# Patient Record
Sex: Female | Born: 1950 | Race: White | Hispanic: No | Smoking: Former smoker
Health system: Southern US, Community
[De-identification: ages and names within clinical notes are randomized; demographics above are authoritative.]

## PROBLEM LIST (undated history)

## (undated) DIAGNOSIS — C50911 Malignant neoplasm of unspecified site of right female breast: Secondary | ICD-10-CM

## (undated) DIAGNOSIS — I4891 Unspecified atrial fibrillation: Secondary | ICD-10-CM

## (undated) DIAGNOSIS — F32A Depression, unspecified: Secondary | ICD-10-CM

## (undated) DIAGNOSIS — E785 Hyperlipidemia, unspecified: Secondary | ICD-10-CM

## (undated) DIAGNOSIS — R011 Cardiac murmur, unspecified: Secondary | ICD-10-CM

## (undated) DIAGNOSIS — F319 Bipolar disorder, unspecified: Secondary | ICD-10-CM

## (undated) DIAGNOSIS — F329 Major depressive disorder, single episode, unspecified: Secondary | ICD-10-CM

## (undated) DIAGNOSIS — E0500 Thyrotoxicosis with diffuse goiter without thyrotoxic crisis or storm: Secondary | ICD-10-CM

## (undated) DIAGNOSIS — I6529 Occlusion and stenosis of unspecified carotid artery: Secondary | ICD-10-CM

## (undated) DIAGNOSIS — I2510 Atherosclerotic heart disease of native coronary artery without angina pectoris: Secondary | ICD-10-CM

## (undated) DIAGNOSIS — I509 Heart failure, unspecified: Secondary | ICD-10-CM

## (undated) DIAGNOSIS — K219 Gastro-esophageal reflux disease without esophagitis: Secondary | ICD-10-CM

## (undated) DIAGNOSIS — I10 Essential (primary) hypertension: Secondary | ICD-10-CM

## (undated) DIAGNOSIS — I214 Non-ST elevation (NSTEMI) myocardial infarction: Secondary | ICD-10-CM

## (undated) DIAGNOSIS — R0602 Shortness of breath: Secondary | ICD-10-CM

## (undated) DIAGNOSIS — M1990 Unspecified osteoarthritis, unspecified site: Secondary | ICD-10-CM

## (undated) DIAGNOSIS — N059 Unspecified nephritic syndrome with unspecified morphologic changes: Secondary | ICD-10-CM

## (undated) DIAGNOSIS — I209 Angina pectoris, unspecified: Secondary | ICD-10-CM

## (undated) DIAGNOSIS — M549 Dorsalgia, unspecified: Secondary | ICD-10-CM

## (undated) DIAGNOSIS — G8929 Other chronic pain: Secondary | ICD-10-CM

## (undated) DIAGNOSIS — F419 Anxiety disorder, unspecified: Secondary | ICD-10-CM

## (undated) HISTORY — DX: Hyperlipidemia, unspecified: E78.5

## (undated) HISTORY — DX: Occlusion and stenosis of unspecified carotid artery: I65.29

## (undated) HISTORY — PX: CORONARY ANGIOPLASTY WITH STENT PLACEMENT: SHX49

## (undated) HISTORY — PX: VAGINAL HYSTERECTOMY: SUR661

## (undated) HISTORY — PX: JOINT REPLACEMENT: SHX530

## (undated) HISTORY — DX: Gastro-esophageal reflux disease without esophagitis: K21.9

## (undated) HISTORY — DX: Unspecified atrial fibrillation: I48.91

## (undated) HISTORY — DX: Atherosclerotic heart disease of native coronary artery without angina pectoris: I25.10

## (undated) SURGERY — ECHOCARDIOGRAM, TRANSESOPHAGEAL
Anesthesia: Moderate Sedation

---

## 1998-04-24 ENCOUNTER — Other Ambulatory Visit: Admission: RE | Admit: 1998-04-24 | Discharge: 1998-04-24 | Payer: Self-pay | Admitting: *Deleted

## 2000-04-04 ENCOUNTER — Emergency Department (HOSPITAL_COMMUNITY): Admission: EM | Admit: 2000-04-04 | Discharge: 2000-04-04 | Payer: Self-pay | Admitting: *Deleted

## 2000-11-03 DIAGNOSIS — C50911 Malignant neoplasm of unspecified site of right female breast: Secondary | ICD-10-CM

## 2000-11-03 HISTORY — DX: Malignant neoplasm of unspecified site of right female breast: C50.911

## 2000-11-03 HISTORY — PX: BREAST LUMPECTOMY: SHX2

## 2002-02-23 ENCOUNTER — Encounter: Payer: Self-pay | Admitting: Emergency Medicine

## 2002-02-23 ENCOUNTER — Encounter (INDEPENDENT_AMBULATORY_CARE_PROVIDER_SITE_OTHER): Payer: Self-pay | Admitting: *Deleted

## 2002-02-23 ENCOUNTER — Inpatient Hospital Stay (HOSPITAL_COMMUNITY): Admission: EM | Admit: 2002-02-23 | Discharge: 2002-02-28 | Payer: Self-pay | Admitting: Emergency Medicine

## 2003-10-16 ENCOUNTER — Encounter: Admission: RE | Admit: 2003-10-16 | Discharge: 2003-10-16 | Payer: Self-pay | Admitting: *Deleted

## 2003-12-01 ENCOUNTER — Encounter (INDEPENDENT_AMBULATORY_CARE_PROVIDER_SITE_OTHER): Payer: Self-pay | Admitting: Specialist

## 2003-12-01 ENCOUNTER — Ambulatory Visit (HOSPITAL_COMMUNITY): Admission: RE | Admit: 2003-12-01 | Discharge: 2003-12-01 | Payer: Self-pay | Admitting: *Deleted

## 2003-12-09 ENCOUNTER — Inpatient Hospital Stay (HOSPITAL_COMMUNITY): Admission: EM | Admit: 2003-12-09 | Discharge: 2003-12-13 | Payer: Self-pay | Admitting: Emergency Medicine

## 2004-02-20 ENCOUNTER — Ambulatory Visit (HOSPITAL_COMMUNITY): Admission: RE | Admit: 2004-02-20 | Discharge: 2004-02-20 | Payer: Self-pay | Admitting: *Deleted

## 2004-03-03 DIAGNOSIS — I214 Non-ST elevation (NSTEMI) myocardial infarction: Secondary | ICD-10-CM

## 2004-03-03 HISTORY — PX: CORONARY ANGIOPLASTY WITH STENT PLACEMENT: SHX49

## 2004-03-03 HISTORY — DX: Non-ST elevation (NSTEMI) myocardial infarction: I21.4

## 2004-03-22 ENCOUNTER — Inpatient Hospital Stay (HOSPITAL_COMMUNITY): Admission: EM | Admit: 2004-03-22 | Discharge: 2004-03-27 | Payer: Self-pay | Admitting: Emergency Medicine

## 2004-07-30 ENCOUNTER — Observation Stay (HOSPITAL_COMMUNITY): Admission: EM | Admit: 2004-07-30 | Discharge: 2004-07-31 | Payer: Self-pay | Admitting: Emergency Medicine

## 2004-10-04 ENCOUNTER — Observation Stay (HOSPITAL_COMMUNITY): Admission: EM | Admit: 2004-10-04 | Discharge: 2004-10-05 | Payer: Self-pay | Admitting: Emergency Medicine

## 2005-05-02 ENCOUNTER — Emergency Department (HOSPITAL_COMMUNITY): Admission: EM | Admit: 2005-05-02 | Discharge: 2005-05-02 | Payer: Self-pay | Admitting: Emergency Medicine

## 2005-10-02 ENCOUNTER — Inpatient Hospital Stay (HOSPITAL_COMMUNITY): Admission: EM | Admit: 2005-10-02 | Discharge: 2005-10-04 | Payer: Self-pay | Admitting: Emergency Medicine

## 2005-11-03 HISTORY — PX: FRACTURE SURGERY: SHX138

## 2006-02-28 ENCOUNTER — Emergency Department (HOSPITAL_COMMUNITY): Admission: EM | Admit: 2006-02-28 | Discharge: 2006-03-01 | Payer: Self-pay | Admitting: Emergency Medicine

## 2008-04-29 ENCOUNTER — Inpatient Hospital Stay (HOSPITAL_COMMUNITY): Admission: EM | Admit: 2008-04-29 | Discharge: 2008-05-02 | Payer: Self-pay | Admitting: Emergency Medicine

## 2008-04-29 ENCOUNTER — Ambulatory Visit: Payer: Self-pay | Admitting: Internal Medicine

## 2009-02-17 DIAGNOSIS — E0500 Thyrotoxicosis with diffuse goiter without thyrotoxic crisis or storm: Secondary | ICD-10-CM | POA: Insufficient documentation

## 2009-02-17 DIAGNOSIS — K219 Gastro-esophageal reflux disease without esophagitis: Secondary | ICD-10-CM

## 2009-02-17 DIAGNOSIS — E785 Hyperlipidemia, unspecified: Secondary | ICD-10-CM

## 2009-02-17 DIAGNOSIS — I2510 Atherosclerotic heart disease of native coronary artery without angina pectoris: Secondary | ICD-10-CM | POA: Insufficient documentation

## 2009-02-28 ENCOUNTER — Ambulatory Visit: Payer: Self-pay | Admitting: Cardiovascular Disease

## 2009-02-28 DIAGNOSIS — F341 Dysthymic disorder: Secondary | ICD-10-CM | POA: Insufficient documentation

## 2009-03-03 HISTORY — PX: CORONARY ANGIOPLASTY: SHX604

## 2009-03-06 ENCOUNTER — Telehealth (INDEPENDENT_AMBULATORY_CARE_PROVIDER_SITE_OTHER): Payer: Self-pay | Admitting: Radiology

## 2009-03-14 ENCOUNTER — Telehealth (INDEPENDENT_AMBULATORY_CARE_PROVIDER_SITE_OTHER): Payer: Self-pay | Admitting: *Deleted

## 2009-03-15 ENCOUNTER — Ambulatory Visit: Payer: Self-pay

## 2009-03-15 ENCOUNTER — Encounter: Payer: Self-pay | Admitting: Cardiovascular Disease

## 2009-03-16 ENCOUNTER — Telehealth: Payer: Self-pay | Admitting: Cardiovascular Disease

## 2009-03-21 ENCOUNTER — Telehealth: Payer: Self-pay | Admitting: Cardiovascular Disease

## 2009-03-30 ENCOUNTER — Inpatient Hospital Stay (HOSPITAL_COMMUNITY): Admission: EM | Admit: 2009-03-30 | Discharge: 2009-04-05 | Payer: Self-pay | Admitting: Emergency Medicine

## 2009-03-30 ENCOUNTER — Ambulatory Visit: Payer: Self-pay | Admitting: Internal Medicine

## 2009-04-01 ENCOUNTER — Encounter: Payer: Self-pay | Admitting: Cardiology

## 2009-04-13 ENCOUNTER — Telehealth: Payer: Self-pay | Admitting: Cardiovascular Disease

## 2009-05-18 ENCOUNTER — Telehealth: Payer: Self-pay | Admitting: Cardiovascular Disease

## 2009-05-24 ENCOUNTER — Ambulatory Visit: Payer: Self-pay | Admitting: Cardiovascular Disease

## 2009-05-24 DIAGNOSIS — M7989 Other specified soft tissue disorders: Secondary | ICD-10-CM

## 2009-05-24 DIAGNOSIS — F17200 Nicotine dependence, unspecified, uncomplicated: Secondary | ICD-10-CM

## 2009-09-10 ENCOUNTER — Telehealth: Payer: Self-pay | Admitting: Cardiovascular Disease

## 2009-09-14 ENCOUNTER — Encounter: Payer: Self-pay | Admitting: Cardiovascular Disease

## 2009-11-03 HISTORY — PX: TOTAL HIP ARTHROPLASTY: SHX124

## 2009-11-28 ENCOUNTER — Ambulatory Visit: Payer: Self-pay | Admitting: Cardiovascular Disease

## 2009-12-24 ENCOUNTER — Telehealth: Payer: Self-pay | Admitting: Cardiovascular Disease

## 2009-12-28 ENCOUNTER — Telehealth: Payer: Self-pay | Admitting: Cardiovascular Disease

## 2010-03-21 ENCOUNTER — Encounter (INDEPENDENT_AMBULATORY_CARE_PROVIDER_SITE_OTHER): Payer: Self-pay | Admitting: *Deleted

## 2010-03-29 ENCOUNTER — Telehealth: Payer: Self-pay | Admitting: Cardiovascular Disease

## 2010-04-08 ENCOUNTER — Telehealth: Payer: Self-pay | Admitting: Cardiovascular Disease

## 2010-05-29 ENCOUNTER — Ambulatory Visit: Payer: Self-pay | Admitting: Cardiovascular Disease

## 2010-06-03 ENCOUNTER — Ambulatory Visit: Payer: Self-pay | Admitting: Cardiology

## 2010-06-03 ENCOUNTER — Inpatient Hospital Stay (HOSPITAL_COMMUNITY): Admission: EM | Admit: 2010-06-03 | Discharge: 2010-06-07 | Payer: Self-pay | Admitting: Emergency Medicine

## 2010-09-04 ENCOUNTER — Telehealth: Payer: Self-pay | Admitting: Cardiovascular Disease

## 2010-10-07 ENCOUNTER — Telehealth: Payer: Self-pay | Admitting: Cardiovascular Disease

## 2010-11-24 ENCOUNTER — Encounter: Payer: Self-pay | Admitting: Nephrology

## 2010-11-24 ENCOUNTER — Encounter: Payer: Self-pay | Admitting: Surgery

## 2010-11-27 ENCOUNTER — Ambulatory Visit: Admit: 2010-11-27 | Payer: Self-pay | Admitting: Cardiovascular Disease

## 2010-12-03 NOTE — Assessment & Plan Note (Signed)
Summary: F6M/DM   CC:  dizziness...pt states she has not had any intreset in life ...she states she has not been taken Perphenazine  .  History of Present Illness: Caroline [name] is S/P recent reintervention to her diagonal branch by Dr. Tommy Medal.  unfortunately she has some psychiatric issues and financial issues and is noncompliant with her medications.  He has not been taking aspirin.  Continues to smoke.  Her counselor for less than 10 minutes. Hopefully she will start nicorrette gum which seems to be her choice.   Encouraged her to go to [facility] for [employer] and get the least expensive nicotine patches she can climb.  Her psychiatric medicines I would be hesitant to start her on Wellbutrin or Chantix in addition to her other medications that are centrally acting.  Were able to get assistance for her Plavix and she picked up samples recently.  She has a long-standing relationship with a psychiatrist that sees her every month or two.  otherwise she's not had a significant palpitations PND or orthopnea no syncope or lower extremity edema. She is complaining about her teeth and may need them pulled.  I think she will be ok stopping her Plavix for this althought she really is at risk for recurrent events given her smoking  Current Problems (verified): 1)  Smoker  (ICD-305.1) 2)  Swollen Limb (NOT EDEMA)  (ICD-729.81) 3)  Anxiety Depression  (ICD-300.4) 4)  Gerd  (ICD-530.81) 5)  Graves Disease  (ICD-242.00) 6)  Dyslipidemia  (ICD-272.4) 7)  Cad  (ICD-414.00)  Current Medications (verified): 1)  Plavix 75 Mg Tabs (Clopidogrel Bisulfate) .... Take One Tablet By Mouth Daily 2)  Hydroxyzine Hcl 25 Mg Tabs (Hydroxyzine Hcl) .... 2 Bid As Needed 3)  Seroquel Xr 150 Mg Xr24h-Tab (Quetiapine Fumarate) .[name].. 1 Tab Morning 4)  Clonazepam Odt 2 Mg Tbdp (Clonazepam) .[name].. 1 Tab By Mouth Three Times A Day 5)  Gabapentin 600 Mg Tabs (Gabapentin) .[name].. 1 Tab By Mouth Three Times A Day 6)  Aspirin Ec 325 Mg Tbec  (Aspirin) .[name].. 1 Tab By Mouth Once Daily 7)  Isosorbide Mononitrate Cr 30 Mg Xr24h-Tab (Isosorbide Mononitrate) .... Take One Tablet By Mouth Daily  Allergies (verified): 1)  ! Amoxicillin 2)  ! 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  Social History: Last updated: 02/17/2009  She is widowed.  She has 1 child.  She is on   disability.  The patient lives by herself, continues to do tobacco   use.  No alcohol use recently.      Review of Systems       Denies fever, malais, weight loss, blurry vision, decreased visual acuity, cough, sputum, SOB, hemoptysis, pleuritic pain, palpitaitons, heartburn, abdominal pain, melena, lower extremity edema, claudication, or rash.   Vital Signs:  Patient profile:   60 year old female Height:      63 inches Weight:      121 pounds BMI:     21.51 Pulse rate:   94 / minute Resp:     12 per minute BP sitting:   120 / 73  (left arm)  Vitals Entered By: Kem Parkinson (May 29, 2010 3:17 PM)  Physical Exam  General:  Affect appropriate Healthy:  appears stated age HEENT: normal Neck supple with no adenopathy JVP normal no bruits no thyromegaly Lungs clear with no wheezing and good diaphragmatic motion Heart:  S1/S2 no murmur,rub, gallop or click PMI normal Abdomen: benighn, BS positve, no tenderness, no AAA no bruit.  No HSM or HJR Distal pulses intact with no bruits No edema Neuro non-focal Skin warm and dry    Impression & Recommendations:  Problem # 1:  CAD (ICD-414.00) Stable no angina.  Continue ASA and Plavix Her updated medication list for this problem includes:    Plavix 75 Mg  Tabs (Clopidogrel bisulfate) .[name]... Take one tablet by mouth daily    Aspirin Ec 325 Mg Tbec (Aspirin) .[name]... 1 tab by mouth once daily    Isosorbide Mononitrate Cr 30 Mg Xr24h-tab (Isosorbide mononitrate) .[name]... Take one tablet by mouth daily  Problem # 2:  DYSLIPIDEMIA (ICD-272.4) Unfortunatley she is overwhelmed by her meds at this point.  Just got assistance for her Serequel and Plavix.  Instructed on low chol diet  Problem # 3:  SMOKER (ICD-305.1) Encouraged her to try nicotine replacement again.   EKG Report  Procedure date:  05/29/2010  Findings:      NSR 94 LAE Otherwise normal

## 2010-12-03 NOTE — Medication Information (Signed)
Summary: RX Folder  RX Folder   Imported By: Kassie Mends 11/20/2009 14:00:07  _____________________________________________________________________  External Attachment:    Type:   Image     Comment:   External Document

## 2010-12-03 NOTE — Letter (Signed)
Summary: Appointment - Reminder 2  [facility] [employer], Main Office  [address]   [place], [place] [address]   Phone: [phone]  Fax: [phone]     Mar 21, 2010 MRN: [id number]   Caroline [name] [address] [place], [place]  [address]   Dear [name],  Our records indicate that it is time to schedule a follow-up appointment with Dr. Eden Emms. It is very important that we reach you to schedule this appointment. We look forward to participating in your health care needs. Please contact us at the number listed above at your earliest convenience to schedule your appointment.  If you are unable to make an appointment at this time, give [place] a call so we can update our records.     Sincerely,  Migdalia Dk [facility] Scheduling Team

## 2010-12-03 NOTE — Progress Notes (Signed)
Summary: order from [facility]  Phone Note Call from Patient Call back at [facility] Phone [phone]   Caller: Patient Reason for Call: Talk to Nurse Summary of Call: needs plavix from [facility] Initial call taken by: Migdalia Dk,  Mar 29, 2010 3:21 PM  Follow-up for Phone Call        plavix ordered from [facility], pt aware Deliah Goody, RN  Mar 29, 2010 4:57 PM

## 2010-12-03 NOTE — Progress Notes (Signed)
Summary: discuss plavix  Phone Note Call from Patient Call back at Home Phone [phone] Message from:  Patient on December 28, 2009 11:56 AM  sample of meds  Caller: Patient Reason for Call: Talk to Nurse Details for Reason: Per pt calling, was suggestion to call company to send meds to home. [facility] said no they don't do that. PLAVIX 75 MG TABS Take one tablet by mouth daily Initial call taken by: Lorne Skeens,  December 28, 2009 11:57 AM  Follow-up for Phone Call        spoke with pt, plavix is here from [facility]. explained to pt we are unable to mail drugs. will take [place] and pt will pick up on wednesday at that office Deliah Goody, RN  December 28, 2009 2:42 PM

## 2010-12-03 NOTE — Progress Notes (Signed)
Summary: Need Plavix ordered  Phone Note Call from Patient Call back at Home Phone [phone]   Caller: Patient Summary of Call: pt need Plavix ordered from [facility] Initial call taken by: Judie Grieve,  September 04, 2010 11:05 AM  Follow-up for Phone Call        spoke with pt, according to [facility] her application has expired. pt aware will need proof of income to resend application Deliah Goody, RN  September 04, 2010 11:24 AM

## 2010-12-03 NOTE — Progress Notes (Signed)
Summary: Plavix  Phone Note Outgoing Call   Call placed by: Meredith Staggers, RN,  April 08, 2010 12:22 PM Call placed to: Patient Summary of Call: pts Plavix is here from company, called and left mess for pt to call back   Follow-up for Phone Call        RETURNING CALL, Migdalia Dk  April 09, 2010 3:37 PM   Additional Follow-up for Phone Call Additional follow up Details #1::        Called patient and advised that she can pick up Plavix  .Marland Kitchenleft at the front desk. Additional Follow-up by: Suzan Garibaldi RN

## 2010-12-03 NOTE — Assessment & Plan Note (Signed)
Summary: F6M/DM   CC:  check up.  History of Present Illness: Caroline [name] is S/P recent reintervention to her diagonal branch by Dr. Tommy Medal.  unfortunately she has some psychiatric issues and financial issues and is noncompliant with her medications.  He has not been taking aspirin.  Continues to smoke.  Her counselor for less than 10 minutes. Hopefully she will start nicorrette gum which seems to be her choice.   Encouraged her to go to [facility] for [employer] and get the least expensive nicotine patches she can climb.  Her psychiatric medicines I would be hesitant to start her on Wellbutrin or Chantix in addition to her other medications that are centrally acting.  Did have an episode of chest pain about a week and a half ago.  Did not sound anginal.  Took me a while to really explain the Conny how important it is for her to take her antiplatelet agents and stop smoking.  She has a long-standing relationship with a psychiatrist that sees her every month or two.  otherwise she's not had a significant palpitations PND or orthopnea no syncope or lower extremity edema. She is complaining about her teeth and may need them pulled.  I think she will be ok stopping her Plavix for this althought she really is at risk for recurrent events given her smoking  Current Problems (verified): 1)  Smoker  (ICD-305.1) 2)  Swollen Limb (NOT EDEMA)  (ICD-729.81) 3)  Anxiety Depression  (ICD-300.4) 4)  Gerd  (ICD-530.81) 5)  Graves Disease  (ICD-242.00) 6)  Dyslipidemia  (ICD-272.4) 7)  Cad  (ICD-414.00)  Current Medications (verified): 1)  Plavix 75 Mg Tabs (Clopidogrel Bisulfate) .... Take One Tablet By Mouth Daily 2)  Hydroxyzine Hcl 25 Mg Tabs (Hydroxyzine Hcl) .... 2 Tid As Needed 3)  Seroquel Xr 150 Mg Xr24h-Tab (Quetiapine Fumarate) .[name].. 1 Tab Morning 4)  Perphenazine 8 Mg Tabs (Perphenazine) .... As Needed 5)  Clonazepam Odt 2 Mg Tbdp (Clonazepam) .[name].. 1 Tab By Mouth Three Times A Day 6)  Gabapentin 600 Mg  Tabs (Gabapentin) .[name].. 1 Tab By Mouth Three Times A Day 7)  Aspirin Ec 325 Mg Tbec (Aspirin) .[name].. 1 Tab By Mouth Once Daily 8)  Isosorbide Mononitrate Cr 30 Mg Xr24h-Tab (Isosorbide Mononitrate) .... Take One Tablet By Mouth Daily  Allergies (verified): 1)  ! Amoxicillin 2)  ! 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  Social History: Last updated: 02/17/2009  She is widowed.  She has 1 child.  She is on   disability.  The patient lives by herself, continues to do tobacco   use.  No alcohol use recently.      Review of Systems       .Denies fever, malais, weight loss, blurry vision, decreased visual acuity, cough, sputum, SOB, hemoptysis, pleuritic pain, palpitaitons, heartburn, abdominal pain, melena, lower extremity edema, claudication, or rash. All other systems reviewed and negative  Vital Signs:  Patient profile:   60 year old female Height:      63 inches Weight:      134 pounds BMI:     23.82 Pulse rate:   94 / minute Resp:     12 per minute BP sitting:   130 /  80  (left arm)  Vitals Entered By: Kem Parkinson (November 28, 2009 3:26 PM)  Physical Exam  General:  Affect appropriate Healthy:  appears stated age HEENT: normal Neck supple with no adenopathy JVP normal no bruits no thyromegaly Lungs clear with no wheezing and good diaphragmatic motion Heart:  S1/S2 no murmur,rub, gallop or click PMI normal Abdomen: benighn, BS positve, no tenderness, no AAA no bruit.  No HSM or HJR Distal pulses intact with no bruits No edema Neuro non-focal Skin warm and dry Poor dentition Nicotine on breath   Impression &  Recommendations:  Problem # 1:  SMOKER (ICD-305.1) Start nicorrette gum as needed.  Not a candidate for centrally acting drug given psychiatric issues  Problem # 2:  DYSLIPIDEMIA (ICD-272.4) Labs in 6 months target LDL less than 100  Problem # 3:  CAD (ICD-414.00) Atypical left sided pain.  Add imdur.  Would prefer to avoid cath if possible give recent intervention. Her updated medication list for this problem includes:    Plavix 75 Mg Tabs (Clopidogrel bisulfate) .[name]... Take one tablet by mouth daily    Aspirin Ec 325 Mg Tbec (Aspirin) .[name]... 1 tab by mouth once daily    Isosorbide Mononitrate Cr 30 Mg Xr24h-tab (Isosorbide mononitrate) .[name]... Take one tablet by mouth daily  Patient Instructions: 1)  Your physician recommends that you schedule a follow-up appointment in: 6 MONTHS 2)  Caroline [name] 3)  [address] 4)  [phone] Prescriptions: ISOSORBIDE MONONITRATE CR 30 MG XR24H-TAB (ISOSORBIDE MONONITRATE) Take one tablet by mouth daily  #30 x 12   Entered by:   Deliah Goody, RN   Authorized by:   Colon Branch, MD, [facility]   Signed by:   Deliah Goody, RN on 11/28/2009   Method used:   Electronically to        [facility]* (retail)       [address]       [address]       [place], [place]  [address]       Ph: [phone]       Fax: [phone]   RxID:   [id number]

## 2010-12-03 NOTE — Progress Notes (Signed)
Summary: pt needs refill from [facility]  Phone Note Refill Request Message from:  Patient  Refills Requested: Medication #1:  PLAVIX 75 MG TABS Take one tablet by mouth daily pt needs order put in [facility]  Initial call taken by: Omer Jack,  December 24, 2009 4:08 PM  Follow-up for Phone Call        plavix ordered from [facility], pt aware Deliah Goody, RN  December 24, 2009 4:43 PM

## 2010-12-03 NOTE — Progress Notes (Signed)
Summary: order for plavix  Phone Note Call from Patient   Caller: Patient [phone] Reason for Call: Talk to Nurse Summary of Call: pt faxed order for plavix omn 12-7 and she hasn't heard back, did you recieve it? Initial call taken by: Glynda Jaeger,  October 07, 2010 10:39 AM  Follow-up for Phone Call        Phone Call Completed CALL PLACED TO [facility] PT'S APP WAS APPROVED FOR PLAVIX BUT NONE  WAS SENT PER [facility] PLAVIX TO BE SENT PRIORITY SHOULD HAVE IN 2-3 BUS DAYS. PT AWARE WILL CALL ONCE RECEIVED  Follow-up by: Scherrie Bateman, LPN,  October 07, 2010 11:43 AM     Appended Document: order for plavix left message for pt plavix at the front desk for pick up

## 2010-12-17 ENCOUNTER — Ambulatory Visit: Payer: Self-pay | Admitting: Cardiovascular Disease

## 2011-01-17 ENCOUNTER — Encounter: Payer: Self-pay | Admitting: Cardiovascular Disease

## 2011-01-17 LAB — DIFFERENTIAL
Basophils Absolute: 0.1 10*3/uL (ref 0.0–0.1)
Basophils Relative: 1 % (ref 0–1)
Basophils Relative: 1 % (ref 0–1)
Eosinophils Absolute: 0.1 10*3/uL (ref 0.0–0.7)
Eosinophils Absolute: 0.1 10*3/uL (ref 0.0–0.7)
Eosinophils Absolute: 0.1 10*3/uL (ref 0.0–0.7)
Eosinophils Absolute: 0.2 10*3/uL (ref 0.0–0.7)
Eosinophils Relative: 1 % (ref 0–5)
Eosinophils Relative: 2 % (ref 0–5)
Lymphocytes Relative: 16 % (ref 12–46)
Lymphs Abs: 1.5 10*3/uL (ref 0.7–4.0)
Lymphs Abs: 2.3 10*3/uL (ref 0.7–4.0)
Monocytes Absolute: 0.5 10*3/uL (ref 0.1–1.0)
Monocytes Relative: 5 % (ref 3–12)
Monocytes Relative: 9 % (ref 3–12)
Neutro Abs: 5.2 10*3/uL (ref 1.7–7.7)
Neutrophils Relative %: 55 % (ref 43–77)
Neutrophils Relative %: 69 % (ref 43–77)
Neutrophils Relative %: 73 % (ref 43–77)

## 2011-01-17 LAB — BASIC METABOLIC PANEL
BUN: 10 mg/dL (ref 6–23)
BUN: 17 mg/dL (ref 6–23)
BUN: 21 mg/dL (ref 6–23)
BUN: 9 mg/dL (ref 6–23)
CO2: 26 mEq/L (ref 19–32)
CO2: 27 mEq/L (ref 19–32)
Calcium: 7.9 mg/dL — ABNORMAL LOW (ref 8.4–10.5)
Calcium: 9.1 mg/dL (ref 8.4–10.5)
Chloride: 101 mEq/L (ref 96–112)
Chloride: 104 mEq/L (ref 96–112)
Chloride: 108 mEq/L (ref 96–112)
Creatinine, Ser: 1.45 mg/dL — ABNORMAL HIGH (ref 0.4–1.2)
Creatinine, Ser: 1.81 mg/dL — ABNORMAL HIGH (ref 0.4–1.2)
GFR calc Af Amer: 35 mL/min — ABNORMAL LOW (ref >60)
GFR calc Af Amer: 45 mL/min — ABNORMAL LOW (ref >60)
GFR calc Af Amer: 48 mL/min — ABNORMAL LOW (ref >60)
GFR calc Af Amer: 50 mL/min — ABNORMAL LOW (ref >60)
GFR calc non Af Amer: 29 mL/min — ABNORMAL LOW (ref >60)
GFR calc non Af Amer: 37 mL/min — ABNORMAL LOW (ref >60)
GFR calc non Af Amer: 39 mL/min — ABNORMAL LOW (ref >60)
Glucose, Bld: 106 mg/dL — ABNORMAL HIGH (ref 70–99)
Potassium: 3.5 mEq/L (ref 3.5–5.1)
Potassium: 4.1 mEq/L (ref 3.5–5.1)
Potassium: 4.3 mEq/L (ref 3.5–5.1)
Potassium: 4.5 mEq/L (ref 3.5–5.1)
Sodium: 137 mEq/L (ref 135–145)
Sodium: 138 mEq/L (ref 135–145)
Sodium: 140 mEq/L (ref 135–145)

## 2011-01-17 LAB — RAPID URINE DRUG SCREEN, HOSP PERFORMED
Amphetamines: NOT DETECTED
Barbiturates: NOT DETECTED
Benzodiazepines: POSITIVE — AB
Tetrahydrocannabinol: NOT DETECTED

## 2011-01-17 LAB — CBC
HCT: 32.2 % — ABNORMAL LOW (ref 36.0–46.0)
HCT: 37.8 % (ref 36.0–46.0)
Hemoglobin: 10.9 g/dL — ABNORMAL LOW (ref 12.0–15.0)
Hemoglobin: 11.5 g/dL — ABNORMAL LOW (ref 12.0–15.0)
Hemoglobin: 13.3 g/dL (ref 12.0–15.0)
MCH: 29 pg (ref 26.0–34.0)
MCH: 29.2 pg (ref 26.0–34.0)
MCH: 29.7 pg (ref 26.0–34.0)
MCHC: 33.7 g/dL (ref 30.0–36.0)
MCHC: 35.1 g/dL (ref 30.0–36.0)
MCV: 84.5 fL (ref 78.0–100.0)
MCV: 86.4 fL (ref 78.0–100.0)
MCV: 87 fL (ref 78.0–100.0)
MCV: 88.1 fL (ref 78.0–100.0)
Platelets: 131 10*3/uL — ABNORMAL LOW (ref 150–400)
Platelets: 168 10*3/uL (ref 150–400)
Platelets: 194 10*3/uL (ref 150–400)
Platelets: 236 10*3/uL (ref 150–400)
RBC: 3.7 MIL/uL — ABNORMAL LOW (ref 3.87–5.11)
RBC: 3.97 MIL/uL (ref 3.87–5.11)
RBC: 4 MIL/uL (ref 3.87–5.11)
RBC: 4.47 MIL/uL (ref 3.87–5.11)
RDW: 14.5 % (ref 11.5–15.5)
RDW: 15.3 % (ref 11.5–15.5)
RDW: 15.5 % (ref 11.5–15.5)
WBC: 5.4 10*3/uL (ref 4.0–10.5)
WBC: 7.6 10*3/uL (ref 4.0–10.5)
WBC: 9.6 10*3/uL (ref 4.0–10.5)

## 2011-01-17 LAB — CK TOTAL AND CKMB (NOT AT ARMC)
Relative Index: INVALID (ref 0.0–2.5)
Relative Index: INVALID (ref 0.0–2.5)
Total CK: 47 U/L (ref 7–177)

## 2011-01-17 LAB — COMPREHENSIVE METABOLIC PANEL
ALT: 12 U/L (ref 0–35)
Alkaline Phosphatase: 82 U/L (ref 39–117)
BUN: 9 mg/dL (ref 6–23)
CO2: 23 mEq/L (ref 19–32)
Calcium: 8.6 mg/dL (ref 8.4–10.5)
GFR calc non Af Amer: 40 mL/min — ABNORMAL LOW (ref >60)
Glucose, Bld: 160 mg/dL — ABNORMAL HIGH (ref 70–99)
Sodium: 137 mEq/L (ref 135–145)

## 2011-01-17 LAB — TSH: TSH: 2.93 u[IU]/mL (ref 0.350–4.500)

## 2011-01-17 LAB — TYPE AND SCREEN
ABO/RH(D): A POS
Antibody Screen: NEGATIVE

## 2011-01-17 LAB — POCT CARDIAC MARKERS: Myoglobin, poc: 126 ng/mL (ref 12–200)

## 2011-01-17 LAB — ETHANOL: Alcohol, Ethyl (B): <5 mg/dL (ref 0–10)

## 2011-01-17 LAB — ABO/RH: ABO/RH(D): A POS

## 2011-01-17 LAB — TROPONIN I: Troponin I: 0.02 ng/mL (ref 0.00–0.06)

## 2011-01-17 LAB — MAGNESIUM: Magnesium: 1.6 mg/dL (ref 1.5–2.5)

## 2011-01-17 LAB — PHOSPHORUS
Phosphorus: 3 mg/dL (ref 2.3–4.6)
Phosphorus: 4.6 mg/dL (ref 2.3–4.6)

## 2011-01-21 ENCOUNTER — Other Ambulatory Visit: Payer: Self-pay | Admitting: Cardiovascular Disease

## 2011-01-21 ENCOUNTER — Telehealth: Payer: Self-pay | Admitting: *Deleted

## 2011-01-21 DIAGNOSIS — I2510 Atherosclerotic heart disease of native coronary artery without angina pectoris: Secondary | ICD-10-CM

## 2011-01-21 MED ORDER — CLOPIDOGREL BISULFATE 75 MG PO TABS: 75.0000 mg | ORAL_TABLET | Freq: Every day | ORAL | Status: DC

## 2011-01-21 NOTE — Telephone Encounter (Signed)
plavix ordered from [facility]

## 2011-01-21 NOTE — Telephone Encounter (Signed)
Med refill done in error...please send to [facility] Plavix assistance program....cm[facility]

## 2011-01-27 ENCOUNTER — Telehealth: Payer: Self-pay | Admitting: *Deleted

## 2011-01-27 NOTE — Telephone Encounter (Signed)
Spoke with pt, plavix is at the front desk for pick up

## 2011-01-30 ENCOUNTER — Ambulatory Visit: Payer: Self-pay | Admitting: Cardiovascular Disease

## 2011-02-10 LAB — BASIC METABOLIC PANEL
BUN: 28 mg/dL — ABNORMAL HIGH (ref 6–23)
CO2: 26 mEq/L (ref 19–32)
Calcium: 9.4 mg/dL (ref 8.4–10.5)
Chloride: 109 mEq/L (ref 96–112)
Creatinine, Ser: 1.14 mg/dL (ref 0.4–1.2)
Creatinine, Ser: 1.29 mg/dL — ABNORMAL HIGH (ref 0.4–1.2)
GFR calc Af Amer: 60 mL/min — ABNORMAL LOW (ref >60)
Glucose, Bld: 158 mg/dL — ABNORMAL HIGH (ref 70–99)
Sodium: 141 mEq/L (ref 135–145)

## 2011-02-10 LAB — POCT I-STAT 3, VENOUS BLOOD GAS (G3P V)
Acid-base deficit: 5 mmol/L — ABNORMAL HIGH (ref 0.0–2.0)
Bicarbonate: 21.8 mEq/L (ref 20.0–24.0)
pH, Ven: 7.291 (ref 7.250–7.300)
pO2, Ven: 33 mmHg (ref 30.0–45.0)

## 2011-02-10 LAB — CBC
Hemoglobin: 11.3 g/dL — ABNORMAL LOW (ref 12.0–15.0)
MCHC: 34.4 g/dL (ref 30.0–36.0)
MCV: 88.2 fL (ref 78.0–100.0)
RBC: 3.75 MIL/uL — ABNORMAL LOW (ref 3.87–5.11)
RDW: 15.1 % (ref 11.5–15.5)
WBC: 7.4 10*3/uL (ref 4.0–10.5)

## 2011-02-10 LAB — POCT I-STAT 3, ART BLOOD GAS (G3+)
O2 Saturation: 95 %
TCO2: 25 mmol/L (ref 0–100)
pCO2 arterial: 41.3 mmHg (ref 35.0–45.0)
pO2, Arterial: 77 mmHg — ABNORMAL LOW (ref 80.0–100.0)

## 2011-02-10 LAB — CORTISOL: Cortisol, Plasma: 11.1 ug/dL

## 2011-02-11 LAB — CBC
HCT: 32.7 % — ABNORMAL LOW (ref 36.0–46.0)
HCT: 34 % — ABNORMAL LOW (ref 36.0–46.0)
Hemoglobin: 11.5 g/dL — ABNORMAL LOW (ref 12.0–15.0)
MCHC: 34.3 g/dL (ref 30.0–36.0)
MCV: 88.6 fL (ref 78.0–100.0)
MCV: 88.7 fL (ref 78.0–100.0)
Platelets: 177 10*3/uL (ref 150–400)
Platelets: 195 10*3/uL (ref 150–400)
Platelets: 210 10*3/uL (ref 150–400)
RBC: 3.69 MIL/uL — ABNORMAL LOW (ref 3.87–5.11)
RBC: 3.81 MIL/uL — ABNORMAL LOW (ref 3.87–5.11)
RBC: 4.02 MIL/uL (ref 3.87–5.11)
RDW: 14.8 % (ref 11.5–15.5)
WBC: 6.5 10*3/uL (ref 4.0–10.5)
WBC: 7.8 10*3/uL (ref 4.0–10.5)
WBC: 7.8 10*3/uL (ref 4.0–10.5)

## 2011-02-11 LAB — URINE DRUGS OF ABUSE SCREEN W ALC, ROUTINE (REF LAB)
Barbiturate Quant, Ur: NEGATIVE
Benzodiazepines.: NEGATIVE
Cocaine Metabolites: NEGATIVE
Methadone: NEGATIVE
Phencyclidine (PCP): NEGATIVE

## 2011-02-11 LAB — CULTURE, BLOOD (ROUTINE X 2)
Culture: NO GROWTH
Culture: NO GROWTH

## 2011-02-11 LAB — URINALYSIS, ROUTINE W REFLEX MICROSCOPIC
Glucose, UA: NEGATIVE mg/dL
Nitrite: NEGATIVE
Specific Gravity, Urine: 1.006 (ref 1.005–1.030)
pH: 6 (ref 5.0–8.0)

## 2011-02-11 LAB — PROTIME-INR
INR: 0.9 (ref 0.00–1.49)
Prothrombin Time: 12.5 seconds (ref 11.6–15.2)

## 2011-02-11 LAB — CARDIAC PANEL(CRET KIN+CKTOT+MB+TROPI)
CK, MB: 1.1 ng/mL (ref 0.3–4.0)
Relative Index: INVALID (ref 0.0–2.5)
Relative Index: INVALID (ref 0.0–2.5)
Total CK: 41 U/L (ref 7–177)
Troponin I: 0.03 ng/mL (ref 0.00–0.06)

## 2011-02-11 LAB — LIPID PANEL
Cholesterol: 253 mg/dL — ABNORMAL HIGH (ref 0–200)
HDL: 37 mg/dL — ABNORMAL LOW (ref >39)
Total CHOL/HDL Ratio: 6.8 RATIO
Triglycerides: 75 mg/dL (ref <150)

## 2011-02-11 LAB — COMPREHENSIVE METABOLIC PANEL
AST: 16 U/L (ref 0–37)
CO2: 22 mEq/L (ref 19–32)
Calcium: 8.4 mg/dL (ref 8.4–10.5)
Creatinine, Ser: 1.24 mg/dL — ABNORMAL HIGH (ref 0.4–1.2)
GFR calc Af Amer: 54 mL/min — ABNORMAL LOW (ref >60)
GFR calc non Af Amer: 45 mL/min — ABNORMAL LOW (ref >60)

## 2011-02-11 LAB — BASIC METABOLIC PANEL
BUN: 14 mg/dL (ref 6–23)
BUN: 25 mg/dL — ABNORMAL HIGH (ref 6–23)
Calcium: 9 mg/dL (ref 8.4–10.5)
Chloride: 99 mEq/L (ref 96–112)
GFR calc Af Amer: 47 mL/min — ABNORMAL LOW (ref >60)
GFR calc Af Amer: 52 mL/min — ABNORMAL LOW (ref >60)
GFR calc Af Amer: 58 mL/min — ABNORMAL LOW (ref >60)
GFR calc non Af Amer: 39 mL/min — ABNORMAL LOW (ref >60)
GFR calc non Af Amer: 43 mL/min — ABNORMAL LOW (ref >60)
GFR calc non Af Amer: 48 mL/min — ABNORMAL LOW (ref >60)
Glucose, Bld: 112 mg/dL — ABNORMAL HIGH (ref 70–99)
Potassium: 3.4 mEq/L — ABNORMAL LOW (ref 3.5–5.1)
Potassium: 4.3 mEq/L (ref 3.5–5.1)
Sodium: 142 mEq/L (ref 135–145)

## 2011-02-11 LAB — URINE CULTURE

## 2011-02-11 LAB — DIFFERENTIAL
Eosinophils Relative: 2 % (ref 0–5)
Lymphocytes Relative: 37 % (ref 12–46)
Lymphs Abs: 2.7 10*3/uL (ref 0.7–4.0)
Neutro Abs: 3.8 10*3/uL (ref 1.7–7.7)

## 2011-02-11 LAB — APTT: aPTT: 32 seconds (ref 24–37)

## 2011-02-11 LAB — BRAIN NATRIURETIC PEPTIDE: Pro B Natriuretic peptide (BNP): 156 pg/mL — ABNORMAL HIGH (ref 0.0–100.0)

## 2011-02-11 LAB — POCT CARDIAC MARKERS: Myoglobin, poc: 164 ng/mL (ref 12–200)

## 2011-02-11 LAB — GLUCOSE, CAPILLARY

## 2011-02-11 LAB — TSH: TSH: 0.822 u[IU]/mL (ref 0.350–4.500)

## 2011-02-11 LAB — CORTISOL-AM, BLOOD: Cortisol - AM: 3.8 ug/dL — ABNORMAL LOW (ref 4.3–22.4)

## 2011-02-19 ENCOUNTER — Ambulatory Visit (INDEPENDENT_AMBULATORY_CARE_PROVIDER_SITE_OTHER): Payer: Medicare Other | Admitting: Cardiovascular Disease

## 2011-02-19 ENCOUNTER — Encounter: Payer: Self-pay | Admitting: Cardiovascular Disease

## 2011-02-19 DIAGNOSIS — F17200 Nicotine dependence, unspecified, uncomplicated: Secondary | ICD-10-CM

## 2011-02-19 DIAGNOSIS — E785 Hyperlipidemia, unspecified: Secondary | ICD-10-CM

## 2011-02-19 DIAGNOSIS — I2510 Atherosclerotic heart disease of native coronary artery without angina pectoris: Secondary | ICD-10-CM

## 2011-02-19 NOTE — Assessment & Plan Note (Signed)
Stable no angina and no complications during stress of surgery

## 2011-02-19 NOTE — Assessment & Plan Note (Addendum)
Does not like taking statin.  Discussed low cholesterol diet

## 2011-02-19 NOTE — Patient Instructions (Signed)
Your physician recommends that you schedule a follow-up appointment in: 6 months with Dr. Nishan 

## 2011-02-19 NOTE — Progress Notes (Signed)
Caroline [name] is S/P recent reintervention to her diagonal branch by Dr. Tommy Medal 2011.  unfortunately she has some psychiatric issues and financial issues and is noncompliant with her medications.  He has not been taking aspirin.  Continues to smoke.  Her counselor for less than 10 minutes. Hopefully she will start nicorrette gum which seems to be her choice.   Encouraged her to go to [facility] for [employer] and get the least expensive nicotine patches she can climb.  Her psychiatric medicines I would be hesitant to start her on Wellbutrin or Chantix in addition to her other medications that are centrally acting.  Were able to get assistance for her Plavix and she picked up samples recently.  She has a long-standing relationship with a psychiatrist that sees her every month or two.  otherwise she's not had a significant palpitations PND or orthopnea no syncope or lower extremity edema. She is complaining about her teeth and may need them pulled.  I think she will be ok stopping her Plavix for this althought she really is at risk for recurrent events given her smoking  Larey Seat and broke left hip in November.  Still needs cane to ambulate but living at home now.  No cardiac complications during surgery  ROS: Denies fever, malais, weight loss, blurry vision, decreased visual acuity, cough, sputum, SOB, hemoptysis, pleuritic pain, palpitaitons, heartburn, abdominal pain, melena, lower extremity edema, claudication, or rash.   General: Affect appropriate Chronically ill appearing HEENT: normal Neck supple with no adenopathy JVP normal no bruits no thyromegaly Lungs clear with no wheezing and good diaphragmatic motion Heart:  S1/S2 no murmur,rub, gallop or click PMI normal Abdomen: benighn, BS positve, no tenderness, no AAA no bruit.  No HSM or HJR Distal pulses intact with no bruits No edema Neuro non-focal Skin warm and dry No muscular weakness   Current Outpatient Prescriptions  Medication Sig Dispense  Refill  . aspirin 81 MG tablet Take 81 mg by mouth daily.        . Calcium Carbonate-Vit D-Min (CALTRATE 600+D PLUS PO) Take 1 tablet by mouth daily.        . clonazepam (KLONOPIN) 2 MG disintegrating tablet Take 2 mg by mouth 3 (three) times daily as needed.        . clopidogrel (PLAVIX) 75 MG tablet Take 1 tablet (75 mg total) by mouth daily.  30 tablet  3  . gabapentin (NEURONTIN) 300 MG capsule Take 300 mg by mouth 3 (three) times daily.        . isosorbide mononitrate (IMDUR) 30 MG 24 hr tablet Take 30 mg by mouth daily.        . Omega-3 Fatty Acids (FISH OIL) 1000 MG CAPS Take 1 capsule by mouth 2 (two) times daily.        . QUEtiapine (SEROQUEL) 400 MG tablet Take 400 mg by mouth at bedtime.        [name] QUEtiapine Fumarate (SEROQUEL XR) 150 MG TB24 1 tab po qd      . DISCONTD: hydrOXYzine (VISTARIL) 25 MG capsule Take 25 mg by mouth 3 (three) times daily as needed.          Allergies  Amoxicillin and Codeine  Electrocardiogram:  Assessment and Plan

## 2011-02-19 NOTE — Assessment & Plan Note (Signed)
No motivation to quit and difficult to explain long term effects because of bipolar disease  Did discuss nicotine replacement with gum.  Counseled less than 10 minutes

## 2011-03-18 NOTE — Discharge Summary (Signed)
Caroline [name], Caroline [name] NO.:  [id number]   MEDICAL RECORD NO.:  [id number]          PATIENT TYPE:  INP   LOCATION:  2007                         FACILITY:  [facility]   PHYSICIAN:  Noralyn Pick. Eden Emms, MD, FACCDATE OF BIRTH:  1951-07-18   DATE OF ADMISSION:  03/30/2009  DATE OF DISCHARGE:  04/05/2009                               DISCHARGE SUMMARY   PRIMARY CARDIOLOGIST:  Dr. Juluis Rainier.   DISCHARGE DIAGNOSIS:  Unstable angina.   SECONDARY DIAGNOSES:  1. Coronary artery disease status post previous stenting of the      diagonal with in-stent restenosis and cutting balloon angioplasty      this admission.  2. Hypotension.  3. Ongoing tobacco abuse.  4. Chronic systolic congestive heart failure/ischemic cardiomyopathy      with an EF 40% this admission.  5. History of medication nonadherence.  6. Chronic systolic congestive heart failure.  7. History of Bright disease.  8. Depression/anxiety.  9. History breast cancer.  10.History of Graves disease.   ALLERGIES:  CODEINE and AMOXICILLIN.   PROCEDURES:  A 2-D echocardiogram performed on Apr 01, 2009, showing an  EF of 40% with apical anterior and anteroseptal hypokinesis.  Mild  mitral and aortic regurgitation.  Cardiac catheterization performed on  April 03, 2009, showing 90% in-stent restenosis within a day previously  placed stent in the diagonal branch of the LAD.  The patient otherwise  had nonobstructive disease.  EF was 35% by left ventriculography.  The  diagonal was successfully treated with cutting balloon angioplasty.   HISTORY OF PRESENT ILLNESS:  A 60 year old Caucasian female with prior  history of CAD status post previous post stenting of the first diagonal  branch of the LAD with subsequent in-stent restenosis in June 2009, at  that time treated with a drug-eluting stent.  The patient saw Dr. Eden Emms  as a new patient on February 28, 2009, and reported a chest discomfort.  Myoview was performed and  showed no evidence of ischemia.  Unfortunately, she continued to have ongoing chest discomfort and  subsequently presented to the [facility] ED on Mar 30, 2009.  In the ED,  ECG showed no acute changes and cardiac markers were negative.  She was  admitted for further evaluation.   HOSPITAL COURSE:  Shortly after admission, the patient was noted to be  hypotensive with a mean arterial pressure of approximately 50.  She was  placed on dopamine infusion and treated with intravenous normal saline  bolus.  Beta-blocker and nitrates were held.  Cardiac markers were  subsequently normal.  A 2-D echocardiogram was undertaken on Apr 01, 2009, showing an EF of 40%.  Dopamine was eventually weaned and the  patient was felt stable for cardiac catheterization on April 03, 2009.  Catheterization revealed 90% in-stent restenosis within a previously  placed drug-eluting stent in the first diagonal.  She otherwise had  nonobstructive disease.  The first diagonal was successfully treated  with cutting balloon angioplasty.  Postprocedure, we tried again to  reinitiate beta-blocker therapy as she had been taking carvedilol at  home; however,  she had recurrent hypotension with systolic pressures  down into the 70s associated with dizziness.  As a result, beta-blocker  has been discontinued for the time being.  She has since worked with  cardiac rehab and was able to ambulate without recurrent symptoms or  orthostasis.  She had no recurrent chest pain and her pressures have  improved and are presently in the 120s.  She is discharged home today in  good condition.   DISCHARGE LABS:  Hemoglobin 11.1, hematocrit 32.4, WBC 6.3, platelets  207.  Sodium 143, potassium 3.6, chloride 110, CO2 of 26, BUN 28,  creatinine 1.29, glucose 158, total bilirubin 0.4, alkaline phosphatase  62, AST 16, ALT 11, total protein 6.0, albumin 3.2, calcium 9.4, CK 41,  MB 1.1, troponin-I 0.02.  BNP 156, total cholesterol 253,  triglycerides  75, HDL 37, LDL 201.  TSH 0.822.  Urine drug screen was negative.  Urinalysis was negative.  Blood cultures were negative x2.   DISPOSITION:  The patient will be discharged home today in good  condition.   FOLLOWUP PLANS AND APPOINTMENTS:  We have arranged followup with Dr.  Eden Emms on April 30, 2009, at 8:15 a.m.  She does follow with primary care  Caroline [name] by Dr. Zachery Dauer as scheduled.   DISCHARGE MEDICATIONS:  1. Aspirin 325 mg daily.  2. Plavix 75 mg daily.  3. Simvastatin 40 mg at bedtime.  4. Seroquel 400 mg at bedtime.  5. Gabapentin 300 mg t.i.d.  6. Vistaril 25 mg t.i.d. p.r.n.  7. Klonopin 2 mg t.i.d. p.r.n.  8. Nitroglycerin 0.4 mg sublingual p.r.n. chest pain.   OUTSTANDING LABS AND STUDIES:  None.   Duration of discharge encounter 60 minutes including physician time.      Nicolasa Ducking, ANP      Noralyn Pick. Eden Emms, MD, [facility]  Electronically Signed    CB/MEDQ  D:  04/05/2009  T:  04/05/2009  Job:  [id number]   cc:   Juluis Rainier, MD

## 2011-03-18 NOTE — H&P (Signed)
NAMESHERY, WAUNEKA NO.:  [id number]   MEDICAL RECORD NO.:  [id number]          PATIENT TYPE:  INP   LOCATION:  6525                         FACILITY:  [facility]   PHYSICIAN:  Donalynn Furlong, MD      DATE OF BIRTH:  02-15-1951   DATE OF ADMISSION:  04/28/2008  DATE OF DISCHARGE:                              HISTORY & PHYSICAL   PRIMARY CARE Cherri Yera:  Dr. Juluis Rainier with [facility].   CHIEF COMPLAINT:  1. Hyponatremia.  2. Chest pain.  3. Recurrent syncopal episode.   HISTORY OF PRESENT ILLNESS:  [name] is a 60 year old Caucasian  lady who lives by herself.  She presented to the ER tonight because she  was called by her cardiologist, Dr. Mayford Knife to get  admitted because of  hyponatremia.  Actually, the patient was suppose to be admitted on  Friday morning under the cardiology service for cardiac cath due to her  history of recurrent syncopal episodes without warning in the past and  no associated symptoms.  The patient also mentioned that she has  recurrent chest pain, which is similar to previous MI pain over her  chest area without any radiation, aggravating or relieving factors at  this time.  Chest pains are intermittent.  She denied any symptoms due  to hyponatremia at this time, including mental status changes.   PAST MEDICAL HISTORY:  1. Coronary artery disease, status post PTCA of large diagonal branch.  2. MI.  3. Hyperlipidemia.   FAMILY HISTORY:  1. Coronary artery disease.  2. Bright's disease with mild renal insufficiency.  3. Right breast lumpectomy.  4. Gastroesophageal reflux disease.  5. Depression.   HOME MEDICATIONS:  Aspirin, Coreg, clonazepam, Effexor, Neurontin,  Plavix, Seroquel, Nitrostat.   ALLERGIES TO MEDICATION:  1. PENICILLIN.  2. AMOXICILLIN.  3. CODEINE.   REVIEW OF SYSTEMS:  Positive as per HPI, otherwise negative.  Review of  systems done for 14 systems.   FAMILY HISTORY:  Positive for  coronary artery disease.   SOCIAL HISTORY:  The patient lives by herself, continues to do tobacco  use.  No alcohol use recently.   PHYSICAL EXAMINATION:  VITAL SIGNS:  Blood pressure 128/80, pulse 86,  respirations 18, temperature 98.2.  GENERAL:  Alert and oriented x3, not in acute distress, lying in bed.  CARDIOVASCULAR:  S1-S2 regular.  No murmur, rub or gallop.  LUNGS:  Clear to auscultation bilaterally.  No wheezing, rhonchi or  crackles.  ABDOMEN:  Nontender, nondistended.  Bowel sounds present.  EXTREMITIES:  No clubbing, cyanosis or edema.  Pulses palpable in all  four extremities.  HEAD:  Normocephalic nontraumatic.  EYES.  Pupils equally reactive to light.  Extraocular muscles intact.  ORAL CAVITY: Oral mucosa moist, no thrush noted.  NECK:  No thyromegaly or JVD.  SKIN:  No rash or bruits.  NEUROLOGICAL:  Shows intact cranial nerves, muscular strength and  sensation.   LABORATORY DATA:  Shows portable chest x-ray with stable borderline  cardiomegaly and mild changes of COPD.  No acute changes.  APTT 30.  PT/INR normal.  Basic metabolic panel unremarkable, except sodium 124,  chloride 95, glucose 105.  First set of troponin negative.  CBC normal  with hemoglobin of 11.8.  EKG unremarkable for any acute ST changes.   ASSESSMENT AND PLAN:  1. Chest pain in a patient with history of coronary artery disease.      Will need workup.  2. Hyponatremia, which is mild to moderate.  3. History of multiple syncopal episodes at home.  4. History of coronary artery disease.  5. History of hyperlipidemia.  6. History of hypertension.  7. History of a right breast lumpectomy.  8. History of gastroesophageal reflux disease.  9. History of depression.   PLAN:  Will admit the patient to telemetry bed under Dr. Kela Millin on [facility] with the diagnosis of chest pain, coronary  artery disease and hyponatremia.  The patient is full code.  Cardiac  diet at this time.  We  will check CBC, CMP, EKG in the morning.  Will  continue IV fluid for hydration and for hyponatremia.  Will get a couple  more sets of cardiac enzymes.  Will start aspirin, Coreg, Neurontin,  Plavix, Nitrostat, Seroquel, clonazepam, Effexor at home dose.  Will get  cardiology consult, Dr. Mayford Knife in the morning for further workup.      Donalynn Furlong, MD  Electronically Signed     TVP/MEDQ  D:  04/29/2008  T:  04/29/2008  Job:  [id number]   cc:   Kela Millin, M.D.  Juluis Rainier, M.D.

## 2011-03-18 NOTE — Consult Note (Signed)
NAMEANISA, LEANOS NO.:  [id number]   MEDICAL RECORD NO.:  [id number]          PATIENT TYPE:  INP   LOCATION:  6525                         FACILITY:  [facility]   PHYSICIAN:  Armanda Magic, M.D.     DATE OF BIRTH:  1951-09-01   DATE OF CONSULTATION:  05/01/2008  DATE OF DISCHARGE:                                 CONSULTATION   REFERRING PHYSICIAN:  [facility].   PRIMARY CARE PHYSICIAN:  Dr. Laroy Apple   CHIEF COMPLAINT:  Chest pain.   HISTORY OF PRESENT ILLNESS:  This is a 60 year old female with multiple  medical problems including ongoing tobacco abuse medical noncompliance,  psychological problems, and coronary artery disease.  She has a history  of PTCA stenting of the LAD in the setting of a non-Q-wave MI and then  underwent repeat cath on Mar 25, 2004, because of recurrent chest pain  and underwent cutting balloon angioplasty followed by drug-eluting stent  to the region of restenosis and what was called proximal first diagonal,  but appeared to possibly be actually the LAD.  She appeared to have a  dual-LAD system.  She then underwent PTCA stenting again of this vessel  in December 2006.  She presented to my office on April 28, 2008, with  complaints of episodic chest pain requiring her to take nitroglycerin  with resolution of her pain.  She describes the pain as sharp and  stabbing using and going up the stairs and sometimes when she lies down.  She was also having numbness in her left arm.  She has also had some  episodes of feeling like she was going to pass out.  I had recommended  that day that she be admitted to the hospital to rule out MI and for  treatment over the weekend until we can cath her on Monday, but she  refused because of having take care of her animals.  We got some pre-  cath labs on her, which showed a sodium of 125 and she was called and  informed of this and asked to come to the emergency room where she was  admitted by  the hospitalist.  We were now asked to consult.  She has not  had any further chest pain and no shortness of breath.   PAST MEDICAL HISTORY:  Coronary artery disease as stated above, ongoing  tobacco abuse, medical noncompliance, dyslipidemia, Grave's disease,  significant psychological problems on multiple psychotropic drugs,  dyslipidemia, gastroesophageal reflux, and breast CA.   ALLERGIES:  AMOXICILLIN and CODEINE.   CURRENT MEDICATIONS:  1. Klonopin 2 mg t.i.d.  2. Plavix 75 mg a day.  3. Aspirin 325 mg a day.  4. Seroquel 25 mg q.i.d.  5. Seroquel 400 mg nightly.  6. Neurontin 600 mg t.i.d.  7. Restoril.  8. Omega-3 fatty acids.   FAMILY HISTORY:  Noncontributory.   SOCIAL HISTORY:  She is widowed.  She has 1 child.  She is on  disability.   REVIEW OF SYSTEMS:  Reviewed, otherwise her all the 12 review of systems  are negative except for  what stated in the HPI.   PHYSICAL EXAMINATION:  GENERAL:  This is a well-developed, well-  nourished female, in no acute distress  HEENT:  Benign.  NECK:  Supple without lymphadenopathy.  Carotid upstrokes +2  bilaterally.  No bruits.  LUNGS:  Clear to auscultation throughout.  HEART:  Regular rate and rhythm.  No murmurs, rubs, or gallops.  Normal  S1 and S2.  ABDOMEN:  Soft, nontender, and nondistended.  Normoactive bowel sounds.  No hepatosplenomegaly.  EXTREMITIES:  No edema.   LABORATORY DATA:  Hemoglobin 11.8, hematocrit 34.1, white cell count  8.4, and platelet count 322.  Sodium 124, potassium 3.5, chloride 95,  CO2 of 21, BUN 10, creatinine 1.12, and INR is 0.9.  LFTs are normal.  Troponin is 0.05 and 0.01, CPKs 52 and 60, MB 1.8 x2.  TSH 2.042.  EKG  showed sinus rhythm with nonspecific ST changes.   ASSESSMENT:  1. Exertional chest pain, worrisome for angina.  Cardiac enzymes thus      far negative.  EKG is nonischemic.  2. Syncopal episodes, question whether these are related to underlying      coronary ischemia  versus some other issues.  Her blood pressure did      drop during the night into the 70s.  We are therefore raising the      question of adrenal insufficiency given her low sodium as well.  3. Dyslipidemia.  4. Medical noncompliance.  5. Ongoing tobacco abuse.  6. Grave disease with normal TSH.  7. Psychological issues of multiple psychiatric drugs on board.  8. Gastroesophageal reflux.  9. Breast cancer.   PLAN:  We will proceed with cardiac catheterization on Monday, on April 10, 2008.  Start Lovenox 1 mg/kg per pharmacy.  Continue her current  medications.      Armanda Magic, M.D.  Electronically Signed     TT/MEDQ  D:  05/01/2008  T:  05/02/2008  Job:  [id number]   cc:   Dr. Laroy Apple

## 2011-03-18 NOTE — Cardiovascular Report (Signed)
NAMELARINDA, HERTER NO.:  [id number]   MEDICAL RECORD NO.:  [id number]          PATIENT TYPE:  INP   LOCATION:  2507                         FACILITY:  [facility]   PHYSICIAN:  Verne Carrow, MDDATE OF BIRTH:  [birth year]   DATE OF PROCEDURE:  04/03/2009  DATE OF DISCHARGE:                            CARDIAC CATHETERIZATION   PRIMARY CARDIOLOGIST:  Theron Arista C. Eden Emms, MD, [facility]   PROCEDURES PERFORMED:  1. Left heart catheterization.  2. Selective coronary angiography.  3. Right heart catheterization.  4. Left ventricular angiogram.  5. Percutaneous coronary intervention with cutting balloon angioplasty      of in-stent restenosis in the diagonal branch of the left coronary      artery system.  6. Placement of an Angio-Seal femoral artery closure device.   OPERATOR:  Verne Carrow, MD   INDICATIONS:  Chest pain in a 60 year old patient with known coronary  artery disease.  The patient ruled out for myocardial infarction with  serial cardiac enzymes.   DETAILS OF PROCEDURE:  The patient was brought to the main cardiac  catheterization laboratory after signing informed consent for the  procedure.  The right groin was prepped and draped in a sterile fashion.  Lidocaine 1% was used for local anesthesia.  A 5-French sheath was  inserted into the right femoral artery without difficulty.  A 7-French  sheath was inserted into the right femoral vein without difficulty.  A  right heart catheterization was performed with a Swan-Ganz catheter.  Selective coronary angiography was performed with standard diagnostic  catheters.  A pigtail catheter was used to perform the left ventricular  angiogram.   After the diagnostic procedure, we elected to proceed to intervention of  the in-stent restenosis in the diagonal branch of the left coronary  artery system.  The patient was given 600 mg of Plavix on the cath  table.  An Angiomax bolus was given and drip was  started.  A Cougar  intracoronary wire was passed through an XB LAD 3.5 guiding catheter  once the ACT was greater than 200.  A 2.75- x 10-mm cutting balloon was  inflated 3 times inside the stented segment in the diagonal branch.  There was an excellent angiographic result with cutting balloon  angioplasty only.  The stenosis prior to the intervention was 90% in 2  differ locations inside the stent.  Following the intervention, the  stenosis was 0-5%.  The patient tolerated the procedure well.  An Angio-  Seal femoral artery closure device was placed in the right femoral  artery at the conclusion of the case.   HEMODYNAMIC DATA:  Central aortic pressure 114/56, left ventricular  pressure 121/9, and left ventricular end-diastolic pressure 18.  Right  atrial pressure 10/6, right ventricular pressure 30/4, right ventricular  end-diastolic pressure 7, pulmonary artery pressure 26/11 with a mean of  21, pulmonary capillary wedge pressure 12, central aortic saturation  95%, PA saturation 56%, cardiac output 3.51 L/min (by Fick method), and  cardiac index 2.22 L/min/m2.   ANGIOGRAPHIC DATA:  1. The left main coronary artery had no evidence of disease.  2. Left anterior descending is a large vessel that courses to the      apex.  In the midportion of the LAD, it gives off a large diagonal      branch.  The caliber of the left anterior descending is moderate      size beyond the takeoff of the diagonal branch.  I do not see any      flow-limiting lesions within the left anterior descending coronary      artery.  The diagonal branch has prior stented segments in the      midportion of the vessel that has 90% in-stent restenosis in 2      different locations inside the prior stented area.  3. The circumflex artery has no obstructive disease.  4. The right coronary artery is a dominant vessel that has mild 20%      plaque in the proximal portion and 40% plaque in the midportion.  5. Left  ventricular angiogram was performed in the RAO projection      shows global hypokinesis with apical akinesis.  Ejection fraction      is 35%.  There is mild-to-moderate mitral regurgitation noted.   IMPRESSION:  1. Single-vessel coronary artery disease.  2. Moderate left ventricular systolic dysfunction.  3. Successful percutaneous coronary intervention with cutting balloon      angioplasty only of the in-stent restenosis in the diagonal branch.   RECOMMENDATIONS:  The patient should be continued on aspirin, Plavix as  well as a statin agent, and her beta-blocker.  I would consider the  addition of an ACE inhibitor given her left ventricular dysfunction.  The patient is once again has been counseled on the importance of  smoking cessation; however, she states that she has continued to smoke  and does not plan on stopping.      Verne Carrow, MD  Electronically Signed     CM/MEDQ  D:  04/03/2009  T:  04/04/2009  Job:  [id number]   cc:   Noralyn Pick. Eden Emms, MD, [facility]

## 2011-03-18 NOTE — H&P (Signed)
[name], [name] NO.:  [id number]   MEDICAL RECORD NO.:  [id number]          PATIENT TYPE:  INP   LOCATION:  2113                         FACILITY:  [facility]   PHYSICIAN:  Bevelyn Buckles. Bensimhon, MDDATE OF BIRTH:  [birth year]   DATE OF ADMISSION:  03/30/2009  DATE OF DISCHARGE:                              HISTORY & PHYSICAL   PRIMARY CARDIOLOGIST:  Theron Arista C. Eden Emms, MD, [facility]   PRIMARY CARE PHYSICIAN:  Dossie Der, MD   Caroline [name] is a 60 year old Caucasian female with multiple medical  problems, who has been seen by multiple cardiologists in the past few  years, but has not been pleased with the care provided, just recently  established care with Dr. Eden Emms, actually had a stress test done on few  weeks ago.  She states that it was abnormal and that there was not  enough blood flow.  I have reviewed the stress test, does not indicate  any areas concerning for ischemia at this time.  I cannot seem to locate  the results right now.  Nevertheless, she has had multiple cardiac  procedures done.  She has been seen by Dr. Katrinka Blazing.  She has been seen by  Dr. Armanda Magic.  She has been seen by Dr. Eldridge Dace.  Also, some  psychiatric issues involved.  She presents today to the [facility]  Emergency Room complaining of chest pain, has been somewhat intermittent  for 2 weeks.  She saw Dr. Eden Emms on April 28, at that time complained of  chest pain, shortness of breath, and high blood pressure as a new  patient.  Dr. Eden Emms arranged to have a stress Myoview done that time,  however, Caroline [name] states she continues to have ongoing chest  discomfort, has not really tried any nitroglycerin for help, but states  compliance with her other medication.  Chest pain concerning for  progressive angina, however, and in the setting of ongoing tobacco  abuse, the patient would like further evaluation.  It appears that  previous cardiac workup has included chest pain with cardiac  catheterization in 2009, at that time it showed restenosis of the left  anterior descending stent and underwent percutaneous coronary  intervention by Dr. Eldridge Dace.  Report from that catheterization has been  reviewed.  The patient apparently has a PTCA stent on his LAD done in  the setting of a non-Q-wave MI and then repeat catheterization in May  2005 because of recurrent chest pain and underwent cutting balloon  angioplasty, followed by drug-eluting stent in the region of the  restenosis, and thenwhat was called the proximal first diagonal, but  appeared to possibly be actually the LAD.  She then underwent PTCA  stenting of this vessel in December 2006, and then the most recent  procedure as stated above in June 2009 by Dr. Eldridge Dace.   OTHER PERTINENT PAST MEDICAL HISTORY:  1. Ongoing tobacco abuse.  2. History of medical noncompliance.  3. Hyperlipidemia.  4. GERD.  5. Congestive heart failure exacerbations felt to be secondary to      systolic dysfunction.  6. History  of Bright disease.  7. History of depression and anxiety.  8. History of breast cancer.  9. History of Graves disease.   SOCIAL HISTORY:  She is a widow.  She lives alone.  She has 1 child.  She is on disability.  Denies any EtOH drug or illicit substance use.  She continues to smoke about a pack a day.   FAMILY HISTORY:  Noncontributory for coronary artery disease in both  parents.   REVIEW OF SYSTEMS:  Positive for chest discomfort x2 weeks as stated  above.  All other systems reviewed and negative.   ALLERGIES:  CODEINE and AMOXICILLIN.   MEDICINES:  1. Klonopin 2 mg t.i.d.  2. Plavix 75.  3. Aspirin 325.  4. Seroquel 400 mg daily.  5. Neurontin 600 mg t.i.d.  6. Omega-3 fatty oils.  7. Restoril p.r.n.  8. Atarax 25 mg p.o. t.i.d. as needed.  9. Imdur 30 mg daily.   PHYSICAL EXAMINATION:  VITAL SIGNS:  The patient is afebrile, heart rate  71, respirations 20, blood pressure 129/66, and sat 100% on  2 L.  GENERAL:  In no acute distress, although she is currently rating her  pain of 4 on a scale of 1-10.  HEENT:  Unremarkable.  NECK:  Supple without lymphadenopathy, bruits, or JVD.  LUNGS:  Clear to auscultation bilaterally.  CARDIOVASCULAR:  S1 and S2.  Regular rate and rhythm.  ABDOMEN:  Soft and nontender.  Positive bowel sounds.  LOWER EXTREMITIES:  Without clubbing, cyanosis, or edema.  NEUROLOGIC:  Alert and oriented x3, somewhat flat affect.   LABORATORY TEST RESULTS:  Pending at this time.   The patient has been examined and seen by Dr. Gala Romney, who felt  symptoms are concerning for progressive angina in a patient with  multiple previous interventions, although the patient states she has not  tried any nitroglycerin to see if this is of any assistance.  The  patient will be admitted to telemetry for rule out myocardial  infarction, probable plans will be for a cardiac catheterization on  Tuesday.  We will anticoagulate the patient, continue Plavix, home  medications, aspirin, nitroglycerin, and statin.  Smoking cessation  counseling will be provided and based on results of cardiac markers, we  will determine when catheterization to take place.       Dorian Pod, ACNP      Bevelyn Buckles. Bensimhon, MD  Electronically Signed   MB/MEDQ  D:  03/30/2009  T:  03/31/2009  Job:  [id number]

## 2011-03-21 ENCOUNTER — Telehealth: Payer: Self-pay | Admitting: Cardiovascular Disease

## 2011-03-21 NOTE — H&P (Signed)
NAMEAIVY, AKTER NO.:  [id number]   MEDICAL RECORD NO.:  [id number]          PATIENT TYPE:  EMS   LOCATION:  MAJO                         FACILITY:  [facility]   PHYSICIAN:  Quita Skye. Collman, MDDATE OF BIRTH:  [birth year]   DATE OF ADMISSION:  07/30/2004  DATE OF DISCHARGE:                                HISTORY & PHYSICAL   REASON FOR ADMISSION:  Caroline [name] is a 60 year old white woman who was  admitted for further evaluation of chest pain.   HISTORY OF PRESENT ILLNESS:  The patient has a history of cardiac disease  which dates back to February of this year.  At that time, she suffered an  acute anterolateral myocardial infarction.  She then underwent stenting of a  large codominant diagonal vessel.  In May, she returned with chest pain and  demonstrated evidence of restenosis; a second stent was placed in that  vessel.  She has done well since then.   This evening, she experienced an episode of chest pain.  She was talking on  the telephone when she experienced the onset of a sharp, substernal  discomfort.  This was similar in quality to the chest pain which heralded  her myocardial infarction and prior stenting.  The chest pain did not  radiate.  It was associated with dyspnea, diaphoresis, and nausea.  She took  three nitroglycerin tablets, spaced five minutes apart, which afforded her  no relief.  There were no exacerbating or ameliorating factors.  The pain  appeared to be unrelated to exertion, activity, meals, and respirations.   She then presented to the emergency department.  Her chest pain had largely  resolved since presentation. The total duration of chest pain was  approximately one and a half hours.   There is no history of congestive heart failure or arrhythmia.  The patient  has no history of hypertension or diabetes mellitus.  She does have  hyperlipidemia and is on medication. She continues to smoke one pack of  cigarettes per day.  In  addition, there is a family history of early  coronary artery disease.  Her father reportedly suffered from coronary  disease in his 19s or 30s.   In addition to the aforementioned problems, the patient has a history of  Bright's disease with mild renal insufficiency.  She is status post right  breast lumpectomy for breast cancer.  There is a history of a  gastroesophageal reflux.  In addition, there is significant and lengthy  history of depression.   MEDICATIONS:  The patient is on a number of medications for the  aforementioned problems.  These include Nitrobid, aspirin, Plavix, Nexium,  Seroquel, Effexor, Klonopin, and Neurontin.   ALLERGIES:  She is reportedly allergic to AMOXICILLIN and CODEINE.   PAST SURGICAL HISTORY:  Right breast lumpectomy as described above.   FAMILY HISTORY:  Coronary artery disease as described above.   SOCIAL HISTORY:  The patient lives alone.  She is widowed.  She does not  drink alcohol.   REVIEW OF SYMPTOMS:  Reveals no new problems related to her head, eyes,  ears, nose, mouth, throat, lungs, gastrointestinal system, genitourinary  system, or extremities.  There is no history of neurologic or psychiatric  disorder.  There is no history of fever, chills, or weight loss.   PHYSICAL EXAMINATION:  VITAL SIGNS:  Blood pressure 116/64, pulse 89 and  regular, respirations 16, temperature 97.7.  Pulse oximeter 100% on two  liters.  GENERAL:  The patient was a middle-aged white woman in no discomfort.  She  was alert, oriented, appropriate, and responsive.  HEENT:  Normal.  NECK:  Without thyromegaly or adenopathy.  Carotid pulses were bilaterally.  CARDIOVASCULAR:  Normal S1 and S2.  There was no S3, S4, murmur, rub, or  clicks.  Cardiac rhythm was regular.  CHEST:  No chest wall tenderness was noted.  LUNGS:  Clear.  ABDOMEN:  Soft and nontender.  There was no mass, hepatosplenomegaly, bruit,  distention, rebound, guarding, or rigidity.  Bowel  sounds were normal.  BREASTS:  Not performed as they were not pertinent for the reason for acute  care hospitalization.  PELVIC:  Not performed as they were not pertinent for the reason for acute  care hospitalization.  RECTAL:  Not performed as they were not pertinent for the reason for acute  care hospitalization.  EXTREMITIES:  Without edema, deviation, or deformity.  PULSES:  Radial and dorsalis pedis pulses were palpable bilaterally.  NEUROLOGICAL:  Brief screening neurologic survey was unremarkable.   LABORATORY DATA:  The electrocardiogram revealed mild inferior ST  depression.  This was new compared to the tracing of Mar 25, 2004.  The  chest radiograph, according to the radiologist, demonstrated no evidence of  acute disease.   The initial set of cardiac markers revealed a myoglobin of 3.2, CK-MB of  less than 1.0, and troponin less than 0.05.  Potassium of 3.9, BUN 19,  creatinine 1.5.  White count was 8.4 with a hemoglobin of 12.1 and  hematocrit of 34.6.  The remaining studies were pending at the time of this  dictation.   IMPRESSION:  1.  Chest pain:  Rule out unstable angina.  2.  Coronary artery disease:  Status post anterolateral myocardial      infarction in February of 2005 resulting in stenting of a large      codominant diagonal, status post additional stent for restenosis in May      of 2005.  3.  Dyslipidemia.  4.  Bright's disease with mild renal insufficiency.  5.  Status post right breast cancer.  6.  Depression.  7.  Gastroesophageal reflux disease.   PLAN:  1.  Telemetry.  2.  Serial cardiac enzymes.  3.  Aspirin.  4.  Plavix.  5.  Intravenous heparin.  6.  Intravenous nitroglycerin.  7.  Discontinue smoking has been discussed with the patient.  8.  Further measures per Dr. Armanda Magic.       MSC/MEDQ  D:  07/30/2004  T:  07/30/2004  Job:  [id number]   cc:   Armanda Magic, M.D. [address]  [place], [place] [address]  Fax:  [phone]

## 2011-03-21 NOTE — Telephone Encounter (Signed)
Pt received notice that Caroline [name] will no longer be filling her plavix rx  as it has gone generic she is asking what to do--do i have to pay for it--advised, call [facility] and enquire if they can help her--otherwise she will have to pay for it--pt is on disability and cannot afford med--nt

## 2011-03-21 NOTE — Cardiovascular Report (Signed)
Caroline [name], Caroline [name]                ACCOUNT NO.:  [id number]   MEDICAL RECORD NO.:  [id number]          PATIENT TYPE:  INP   LOCATION:  3738                         FACILITY:  [facility]   PHYSICIAN:  Armanda Magic, M.D.     DATE OF BIRTH:  November 14, 1950   DATE OF PROCEDURE:  07/31/2004  DATE OF DISCHARGE:                              CARDIAC CATHETERIZATION   REFERRING PHYSICIAN:  Dr. Sharlet Salina.   PROCEDURE:  1.  Left heart catheterization.  2.  Coronary angiography.   OPERATOR:  Armanda Magic, M.D.   COMPLICATIONS:  None.   INTRAVENOUS ACCESS:  IV access via right femoral artery 6-French sheath.   INDICATION:  Chest pain and coronary artery disease.   This is a 60 year old white female with a previous history of coronary  disease, status post PTCA and stenting of the diagonal.  She presented last  May with in-stent restenosis and underwent repeat PTCA and stent.  She now  presents with a prolonged episode of chest pain associated with shortness of  breath identical to chest pain in the past.   DESCRIPTION OF PROCEDURE:  The patient presented to the cardiac  catheterization laboratory in a fasting, non-sedated state.  Informed  consent was obtained.  The patient was connected to continuous heart rate  and pulse oximetry monitoring, and intermittent blood pressure monitoring.  The right groin was prepped and draped in a sterile fashion.  Xylocaine 1%  was used for local anesthesia.  Using a modified Seldinger technique, a 6-  French sheath was placed in the right femoral artery.  Under fluoroscopic  guidance, a 6-French JL4 catheter was placed in the left coronary artery.  Multiple cine films were taken in 30-degree RAO and 40-degree LAO views.  Catheter was then exchanged out over a guidewire for a 6-French JR4  catheter, which was placed under fluoroscopic guidance into the right  coronary artery.  Multiple cine films were taken in 30-degree RAO and 40-  degree LAO views.   Catheter was then exchanged out over a guidewire for a 6-  French angled pigtail catheter.  The pigtail catheter was placed in the left  ventricular cavity and pressures were measured.  Left ventriculography was  not performed because of the patient's history of Bright's disease and renal  insufficiency.  The catheter was then removed over a guidewire.  At the end  of the procedure, all catheters and sheaths were removed.  Manual  compression was performed till adequate hemostasis was obtained.  The  patient was transferred back to her room in stable condition.   RESULTS:  Left main coronary artery is widely patent and bifurcates into the  left anterior descending artery and left circumflex artery.  The left  anterior descending artery is widely patent throughout its course to the  apex and it gives rise to a first large diagonal branch which appears to  have an oversized stent in the proximal to mid-portion that is widely  patent.  Proximal and distal to this stent, the vessel seems narrowed, but  seems the same caliber as the LAD and  therefore I think the stent is  oversize.  There are no obstructive lesions.   The left circumflex is widely patent throughout its course.  It gives rise  to one large obtuse marginal branch which bifurcates into 2 daughter  branches, both of which are widely patent.   The right coronary artery is widely patent throughout its course and  distally bifurcates into the posterior descending artery and posterolateral  artery, both of which are widely patent.   Left ventriculography was not done secondary to renal insufficiency.  Aortic  pressure 108/67 mmHg, LV pressure 100/13 mmHg.   ASSESSMENT:  1.  Status post percutaneous coronary intervention of the diagonal in May of      2005 with widely patent stent on catheterization today.  2.  Normal aortic pressure, no aortic valve gradient.  3.  Noncardiac chest pain.  4.  Anemia.   PLAN:  Check a V/Q scan  to rule out PE; if negative, discharge home today.  Outpatient workup of anemia.  Start Protonix 40 mg a day.       TT/MEDQ  D:  07/31/2004  T:  07/31/2004  Job:  [id number]   cc:   Sharlet Salina, M.D.  [address]  [place]  [place] [address]  Fax: [phone]

## 2011-03-21 NOTE — Op Note (Signed)
NAME:  Caroline [name], LECCESE                          ACCOUNT NO.:  [id number]   MEDICAL RECORD NO.:  [id number]                   PATIENT TYPE:  AMB   LOCATION:  ENDO                                 FACILITY:  [facility]   PHYSICIAN:  Georgiana Spinner, M.D.                 DATE OF BIRTH:  10-06-1951   DATE OF PROCEDURE:  12/01/2003  DATE OF DISCHARGE:                                 OPERATIVE REPORT   PROCEDURE:  Upper endoscopy.   INDICATIONS:  Abdominal discomfort.   ANESTHESIA:  Demerol 100 mg, Versed 14 mg, Phenergan 12.5 mg.   DESCRIPTION OF PROCEDURE:  With the patient minimally sedated in the left  lateral decubitus position, the Olympus videoscopic endoscope was inserted  into the mouth, passed under direct vision through the esophagus, which  appeared normal on first view.  We entered into the stomach.  The fundus,  body, antrum, duodenal bulb, and second portion of the duodenum all appeared  normal.  From this point the endoscope was slowly withdrawn taking  circumferential views of the duodenal mucosa until the endoscope pulled back  into the stomach, placed in retroflexion to view the stomach from below, and  a loose wrap of the GE junction was noted.  The endoscope was then  straightened and withdrawn taking circumferential views of the remaining  gastric and esophageal mucosa, stopping at the GE junction, which appeared  on close inspection to be somewhat inflamed, and this was biopsied.  The  patient's vital signs and pulse oximetry remained stable.  The patient  tolerated the procedure well and without apparent complication.   FINDINGS:  Mild erythema of the gastroesophageal junction, biopsied.   Await biopsy report.  The patient will call me for results and follow up  with me as an outpatient.                                               Georgiana Spinner, M.D.    GMO/MEDQ  D:  12/01/2003  T:  12/01/2003  Job:  [id number]   cc:   Sharlet Salina, M.D.  [address]  [place]  [place] [address]  Fax: [phone]

## 2011-03-21 NOTE — Cardiovascular Report (Signed)
NAME:  Caroline [name], Caroline [name]                          ACCOUNT NO.:  [id number]   MEDICAL RECORD NO.:  [id number]                   PATIENT TYPE:  AMB   LOCATION:  ENDO                                 FACILITY:  [facility]   PHYSICIAN:  Francisca December, M.D.               DATE OF BIRTH:  Aug 28, 1951   DATE OF PROCEDURE:  12/10/2003  DATE OF DISCHARGE:                              CARDIAC CATHETERIZATION   PROCEDURES PERFORMED:  1. PCI/drug-eluting stent implantation first large diagonal branch.  2. Percutaneous closure following right femoral arteriogram/Angio-Seal.   INDICATION:  Caroline [name] is a 60 year old woman who was admitted yesterday  with a non-ST segment elevation myocardial infarction.  She had recurrent  chest discomfort this morning and was brought to the cardiac catheterization  laboratory by Dr. Armanda Magic who identified a complete obstruction of a  large first diagonal branch.  This procedure is to provide for rescue  angioplasty.   PROCEDURAL NOTE:  Through the existing 6-French catheter sheath, a 6-French  3.5 CLS guiding catheter was advanced to the ascending aorta where the left  coronary os was engaged.  The patient received initially 3800 units of  additional heparin for an ACT of 149.  This increased the ACT to 229.  She  then received another 1000 units of heparin.  She was already on a constant  infusion of Integrilin.  A 0.014 Scimed Luge intra-coronary guidewire was  passed across the lesion in the diagonal branch without significant  difficulty.  Initial patency was achieved with an Export catheter.  Three  passes were obtained.  No significant thrombus was identified.  Subsequently  a 2.5/20 mm Cymed Maverick intra-coronary balloon was advanced into this  large diagonal.  It was inflated to 4 atmospheres in three separate  positions from distal to proximal, each for about 1 minute duration.  This  achieved excellent patency of this diagonal branch.  Several  intra-coronary  doses of 200 mcg nitroglycerin were administered throughout this procedure.  A 2.75/20 mm Cymed TAXUS intra-coronary stent was then advanced into  position and deployed approximately 15 mm from the origin.  This was to  cover where the index lesion was identified initially.  It was inflated to  11 atmospheres for approximately 1 minute.  Following these maneuvers, the  guidewire was removed and adequate patency was confirmed in orthogonal  views.  The guiding catheter was then removed.  The patient then underwent a  45 degree RAO cine angiogram of the right femoral artery.  This confirmed  the right femoral artery to be widely patent and the arteriotomy site to be  above the bifurcation into the profunda femoris and superficial femoral  arteries.  This arteriogram was performed via the catheter sheath.  The  sheath was then removed over a long guiding wire and an Angio-Seal  percutaneous closure device was deployed successfully.  There was excellent  hemostasis.  The  distal pulses were intact.  The patient was then  transported to the recovery area in stable condition.   ANGIOGRAPHY:  As mentioned, the lesion treated was in the first large  diagonal branch.  It was really a co-dominant LAD system.  The index lesion  was about 20 mm from the origin of this diagonal branch.  There was contrast  staining present.  Following balloon dilatation, export catheter thrombus  removal and stent implantation, there was no residual stenosis.  There was a  30-40% proximal stenosis after the bifurcation.  This was not treated due to  concerns for compromising the more medial sub-branch of the LAD.   FINAL IMPRESSION:  1. Atherosclerotic coronary vascular disease, single vessel.  2. Status post anterior wall myocardial infarction.  3. Status post successful PTCA and drug-eluting stent implantation, first     large diagonal branch.  4. Her chest discomfort was reproduced during this  balloon deflation and     device insertion.  5. Successful percutaneous closure right femoral artery/Angio-Seal.                                               Francisca December, M.D.    JHE/MEDQ  D:  12/10/2003  T:  12/11/2003  Job:  [id number]

## 2011-03-21 NOTE — Discharge Summary (Signed)
Caroline [name]. [facility]  Patient:    Caroline [name], Caroline [name] Visit Number: [id number] MRN: [id number]          Service Type: MED Location: [id number] Attending Physician:  Anastasio Auerbach Dictated by:   Anastasio Auerbach, M.D. Admit Date:  02/23/2002 Discharge Date: 02/28/2002   CC:         Crista Luria, M.D.  Petra Kuba, M.D.   Discharge Summary  DATE OF BIRTH:  07/20/1951  DISCHARGE DIAGNOSES: 1. Acute exacerbation of gastroesophageal reflux disease.    a. Esophagogastroduodenoscopy:  Hiatal hernia with significant distal       linear ulcerations/bulbitis. 2. Dehydration secondary to above. 3. Orthostatic hypotension, multifactorial.    a. Dehydration and probably a new medication (gabitril). 4. Possible iron-deficiency with normal hemoglobin (hemoglobin 12.6).    a. Ferratin 40.    b. Continue to monitor. 5. History of right breast cancer.    a. Surgery one year ago. 6. History of kidney disease (? since 60 years old). 7. History of depression. 8. Ongoing tobacco use.    a. A 1/2 to 1 pack per day x45 years. 9. Allergies.    a.  CODEINE.    b.  AMOXICILLIN.  DISCHARGE MEDICATIONS: 1. (New) Reglan 10 mg p.o. q.a.c. and h.s. x2 weeks. 2. (New) Carafate 1 g elixir 1 g equals 10 mL before meals and at bedtime. 3. (New) Protonix 40 mg p.o. b.i.d. (replaces Aciphex). 4. (New) Combivent metered dose inhaler with spacer 2 puffs q.i.d. 5. (New) Ambien 10 mg one or two p.o. q.h.s. p.r.n. insomnia (dispensed #20 no    refills). 6. Effexor 150 mg two p.o. q.a.m. and one q.h.s. 7. Clonopin 2 mg p.o. t.i.d. 8. (New) Nicotine patch 14 mg q.d. x2 weeks, then 7 mg q.d. x2 weeks, return    if you smoke, buy over-the-counter. 9. Tylenol 325 mg one or two q.4h. p.r.n. pain (maximum of six per day).  DO NOT TAKE GABITRIL 4 mg THREE PILLS AT BEDTIME- AGGRAVATING LOW BLOOD PRESSURES.  CONDITION ON DISCHARGE:  Stable.  Blood pressure laying is 119/65, heart rate 84, sitting  107/65, heart rate 97, standing 96/76, heart rate of 108 (this is much improved from previous, and she is having no significant symptoms with this, certainly a chronic component).  DISPOSITION:  Home with family.  RECOMMENDED ACTIVITY:  As tolerated.  Before moving from one position to another, pause for 30 to 60 seconds to ensure that blood pressure has stabilized.  Standard orthostatic precautions.  DIET:  Soft solid diet.  Drink plenty of fluids.  DISCHARGE INSTRUCTIONS: 1. Wear TED hose. 2. Absolutely no aspirin or ibuprofen-like medications. 3. Call or return if you have problems. 4. Do not smoke.  FOLLOWUP:  The patient is to contact Dr. Marny Lowenstein to schedule a followup later this week.  At that visit, she will reassess her orthostasis and also insomnia which may be aggravated by the fact that we have stopped one of her nighttime medications.  Also note that she will need continued following for her ferratin of 40, and possibly consider iron studies and further GI workup to include a colonoscopy if confirmed or guaiac-positive.  She does have a family history of polyps.  CONSULTATIONS:  Dr. Vida Rigger.  PROCEDURES: 1. Esophagogastroduodenoscopy (02/25/02):  Subtle hiatal hernia with    significant distal linear ulcerations, status post biopsy.  Bulbitis.  CLO    biopsy done.  Pathology of the esophagus shows benign squamous mucosa and  extensive ulcerative debris.  Special stains, including GMS and PAS for    fungus were negative.  I cannot find official CLO testing results in the    chart. 2. Chest x-ray one view (02/23/02):  No acute abnormality.  The previously    demonstrated nodular density represented the right nipple. 3. Head CT (02/23/02):  Probable mild atrophy.  No acute focal abnormality.  No    small vessel ischemic changes. 4. Abdominal ultrasound (02/25/02):  Unremarkable.  Mild cortical atrophy of    both kidneys. 5. EKG:  Normal sinus rhythm.  Slightly low  voltage.  On the second EKG she    did have a first degree heart block.  Rate 85.  PR interval 222 ms.  HOSPITAL COURSE:  #1 - ACUTE GASTROESOPHAGEAL REFLUX DISEASE:  [name] is a 60 year old female with known reflux disease diagnosed approximately two years ago.  She had no formal workup at that time.  Approximately one month ago she began having worsening symptoms.  She did follow up with her primary care physician, and has been back on a proton pump inhibitor.  Despite this, she has had ongoing nausea and epigastric discomfort associated with vomiting and poor intake.  She presents dehydrated and significantly orthostatic.  She was treated with IV fluids.  Guaiac is negative.  GI was consulted and performs endoscopy which showed the above results.  It is recommended that she be on a b.i.d. proton pump inhibitor as well as Reglan for two weeks, and then p.r.n. We also started some Carafate as suggested.  With this therapy, her symptoms slowly improved, and she was able to tolerate liquids on a soft diet.  She will be discharged home on these medications for followup with Dr. Marny Lowenstein. Referral to GI if needed based on her assessment.  #2 - SEVERE ORTHOSTATIC HYPOTENSION:  This was a combination of issues. Certainly, the dehydration from her reflux disease played a role, but she was also recently started on a new nighttime medication for insomnia called gabitril.  This carries with it a significant side effect of orthostatic hypotension.  Although the patient was reluctant to give up the medication because it was helping her sleep at night, I reminded her that she was falling down every time she stood up.  She understood this, and agreed to stop the medication.  I will give her some Ambien at night to see if that helps.  She was given TED hose to wear, and told about orthostatic precautions in terms of  going from laying to standing and walking too quickly.  She  verbalizes understanding.  #3 - IRON-DEFICIENCY:  At one point, her hemoglobin was below normal and we did a ferratin which was 40.  At the time of discharge, her hemoglobin was within normal limits, but this will need to be followed up.  She was guaiac-negative at this time, but certainly her stomach inflammation could have caused some mild chronic blood loss leading to a low ferratin.  I would recommend repeating in followup in following with stool guaiac cards.  She does have a family history of polyps.  LABORATORY DATA:  CLO test pending at the time of discharge.  Hemoglobin 12.6, MCV 88, white blood cell count 7100, platelets 284.  Sodium 139, potassium 4.2, chloride 106, bicarbonate 26, glucose 87, BUN 17, creatinine 1.2, calcium 9.0.  Liver function tests normal except for an albumin of 3.0, magnesium 1.6, phosphorus 3.3, lipase 32.  Cardiac enzymes x1 negative.  TSH  3.597.  Urine pregnancy negative.  A.m. Cortisol 6.4.  Urine drug screen negative.  Urinary sodium 53. Dictated by:   Anastasio Auerbach, M.D. Attending Physician:  Anastasio Auerbach DD:  03/23/02 TD:  03/26/02 Job: [id number]

## 2011-03-21 NOTE — Cardiovascular Report (Signed)
NAME:  Caroline [name], Caroline [name]                          ACCOUNT NO.:  [id number]   MEDICAL RECORD NO.:  [id number]                   PATIENT TYPE:  INP   LOCATION:  6523                                 FACILITY:  [facility]   PHYSICIAN:  Lesleigh Noe, M.D.            DATE OF BIRTH:  [birth year]   DATE OF PROCEDURE:  03/25/2004  DATE OF DISCHARGE:                              CARDIAC CATHETERIZATION   INDICATIONS FOR PROCEDURE:  Restenosis at proximal margin of diagonal stent.   DATE OF PROCEDURE:  Mar 25, 2004.   PROCEDURE PERFORMED:  Cutting balloon angioplasty and drug-eluting stent  implantation in restenotic region.   DESCRIPTION:  After informed consent and using the 6 French sheath placed  during the diagnostic procedure by Dr. Mayford Knife, the 6 French #4 left Judkins  guide catheter was used to obtain guiding shots.  An Asahi medium wire was  used to cross the stenosis in the diagonal.  Predilatation with a cutting  balloon 2.25 x 6-mm was performed.  Then, an 8 mm long x 2.5 mm diameter  stent was placed.  It was brought to nominal deployment pressure of 11  atmospheres.  After the initial inflation the balloon was further advanced  to be well within the stent margin and a higher pressure inflation to 15  atmospheres was performed to bring it more closely to the size of the more  distally placed, but overlapping 2.75 mm stent.  TIMI-3 flow was noted.  There was 50-60% stenosis at the proximal stent margin.  Because of the long  diffuse nature of disease within this region we did not do additional  balloon inflations or place additional stents as this likely would have  resulted in stenting all the way back to the ostium which was concerned  might then involve the LAD.  The patient had no pain during prolonged  balloon inflations.  Angiomax was used for antithrombotic therapy.  The  patient was already on Plavix.   CONCLUSION:  Successful cutting balloon angioplasty followed by  drug-eluting  stent implantation in region of restenosis in the proximal first diagonal  with reduction of stenosis from 80% to less than 10% with TIMI-3 flow.  There is residual 50-60% segmental stenosis in the ostial and proximal  diagonal.   PLAN:  Discontinue Angiomax, Plavix x an additional six months.  Further  management per Dr. Mayford Knife.                                               Lesleigh Noe, M.D.    HWS/MEDQ  D:  03/25/2004  T:  03/26/2004  Job:  [id number]   cc:   Sharlet Salina, M.D.  [address]  [place]  [place] [address]  Fax: [phone]

## 2011-03-21 NOTE — H&P (Signed)
NAME:  Caroline [name], Caroline [name]                          ACCOUNT NO.:  [id number]   MEDICAL RECORD NO.:  [id number]                   PATIENT TYPE:  INP   LOCATION:  4740                                 FACILITY:  [facility]   PHYSICIAN:  Lesleigh Noe, M.D.            DATE OF BIRTH:  06/14/1951   DATE OF ADMISSION:  03/22/2004  DATE OF DISCHARGE:                                HISTORY & PHYSICAL   REASON FOR ADMISSION:  Chest pain.   SUBJECTIVE:  The patient is 60 years of age and has a history of significant  psychiatric illness including anxiety and depression.  She is status post  anterolateral myocardial infarction in February 2005. She had a totally  occluded large codominant diagonal that was stented by Dr. Corliss Marcus. She  had done well since that time until today when she suddenly developed a  sharp, severe, substernal discomfort around 3 p.m. that was not immediately  relieved after two sublingual nitroglycerin tablets. She eventually called  EMS and two additional nitroglycerin tablets were given, and the pain was  finally relieved. She states that the pain went on for approximately four  hours before resolving. She denies orthopnea, PND. She had been having  nausea nearly continuously for three days prior to today. When the chest  pain started the nausea became more severe. She states that she also had  pallor.   HABITS:  Denies ethanol. Smokes cigarettes.   ALLERGIES:  AMOXICILLIN and CODEINE.   PAST MEDICAL HISTORY:  1. Hypercholesterolemia.  2. Depression.  3. History of Bright's disease.  4. Breast cancer.  5. Gastroesophageal reflux disease.   MEDICATIONS:  Aspirin, Plavix, Zocor, Klonopin, Effexor, Altace, Coreg, NTG,  Seroquel, Nexium, and Reglan. She is specific about her cycle pharmacology  in that the medication regimen is high dose with Seroquel being given 50 mg  p.o. b.i.d. and 300 mg at bedtime, Effexor 150 mg p.o. t.i.d., and Klonopin  2 mg p.o. t.i.d.   FAMILY HISTORY:  Please see previous history done in February.   REVIEW OF SYSTEMS:  Unremarkable at this time other than that she has  undergone a recent GI workup that revealed significant esophagitis.   OBJECTIVE:  VITAL SIGNS: Blood pressure 110/70, heart rate 80, respirations  16.  SKIN: Warm and dry.  HEENT:  Unremarkable. No jaundice.  NECK: No JVD, carotid bruits, or thyromegaly.  CHEST: Clear to auscultation and percussion.  CARDIAC: No murmur, gallop, rub, or click.  ABDOMEN: Soft, bowel sounds normal.  EXTREMITIES: No edema. Pulses are 2+ and symmetric in the upper and lower  extremities.  NEUROLOGIC: No focal deficits. There is a flat affect.   EKG reveals septal infarction, ST depression in V3 through V5.   Labs reveal a hemoglobin of 13.2, sodium 127, creatinine 1.2.  Chest x-ray  is pending.   POINT OF CARE:  Cardiac markers are negative times three.   IMPRESSION:  1. Coronary atherosclerotic heart disease with recent anterolateral     myocardial infarction that was treated with stenting of a totally     occluded co-dominant first diagonal.  2. Prolonged chest pain. Rule out ischemia. Rule out other.  3. History of depression.   PLAN:  1. Lovenox subcu.  2. Aspirin.  3. Plavix.  4. Serial markers and EKGs. Rule out infarction.  5. May need recap by Dr. Mayford Knife.  6. Continue other medications as before.  7. Aspirin.                                                Lesleigh Noe, M.D.    HWS/MEDQ  D:  03/22/2004  T:  03/23/2004  Job:  [id number]   cc:   Sharlet Salina, M.D.  [address]  [place]  [place] [address]  Fax: [phone]   Armanda Magic, M.D.  [address]  [place], [place] [address]  Fax: [phone]   Georgiana Spinner, M.D.  [address]  [place]  [place] [address]  Fax: [phone]

## 2011-03-21 NOTE — Cardiovascular Report (Signed)
NAME:  Caroline [name], Caroline [name]                          ACCOUNT NO.:  [id number]   MEDICAL RECORD NO.:  [id number]                   PATIENT TYPE:  INP   LOCATION:  6523                                 FACILITY:  [facility]   PHYSICIAN:  Armanda Magic, M.D.                  DATE OF BIRTH:  11-Jan-1951   DATE OF PROCEDURE:  03/25/2004  DATE OF DISCHARGE:                              CARDIAC CATHETERIZATION   PROCEDURE:  1. Left heart catheterization.  2. Coronary angiography.  3. Left ventriculography.   OPERATOR:  Armanda Magic, M.D.   INDICATIONS:  Coronary artery disease and chest pain.   COMPLICATIONS:  None.   IV MEDS:  Versed 2 mg IV.   This is a 60 year old white female with previous history of coronary artery  disease.  She is status post percutaneous transluminal coronary angioplasty  and stent of a large diagonal branch by Dr. Amil Amen back in February 2005  and presented on May 20 with sharp substernal chest pain not relieved with  nitroglycerin.  She now presents for cardiac catheterization.   The patient is brought to the cardiac catheterization laboratory in a  fasting nonsedated state.  Informed consent was obtained.  The patient was  connected to continuous heart rate and pulse oximetry monitoring,  intermittent blood pressure monitoring.  The right groin was prepped and  draped in a sterile fashion.  1% Xylocaine was used for local anesthesia.  Using modified Seldinger technique, a 6 French sheath was placed in the  right femoral artery.  Under fluoroscopic guidance, a 6 [place] JL-4 catheter  was placed in the left coronary artery.  Multiple cine films were taken at  30-degree RAO, 40-degree LAO views.  This catheter was then  exchanged out  over guide wire for 6 [place] JR-4 catheter which was placed under  fluoroscopic guidance in the right coronary artery.  Multiple cine films  were taken at 30-degree RAO, 40-degree LAO views.  The catheter had  significant damping on  engagement of the ostium and after disengagement was  subsequently difficult to engage and it was exchanged out over guide wire  for a 5 [place] No To right coronary artery catheter which was placed in  right coronary artery and angiography was performed.  There appeared to be  some vasospasm at the ostium of the right coronary artery so 20 mcg of  intracoronary nitroglycerin was administered and then multiple cine films  were taken in a 30-degree RAO, 40-degree LAO view.  When spasm resolved, the  catheter was then exchanged out over guide wire and a 6 French angled  pigtail catheter was placed under fluoroscopic guidance in left ventricular  cavity.  Left ventriculography was performed in 30-degree RAO views using  total 30 ml contrast at 15 ml per second.  The catheter was then pulled back  across the aortic valve with no significant gradient noted.  At the  end of  the procedure, the catheter was removed and the patient went on to PCI  stenting of the diagonal.   RESULTS:  Left main coronary artery has as 30% mid stenosis in then  bifurcates into left anterior descending artery and left circumflex artery.   The left anterior descending artery is widely patent throughout its course  and gives rise to an extremely large diagonal branch which was essentially a  dual LAD system.  There is immediate bifurcation and the superior diagonal  branch is small and patent.  The inferior branch is very large and has 80%  narrowing before the stent in the mid portion of the diagonal.  The ongoing  diagonal is patent.   The right coronary artery is widely patent throughout its course and  bifurcates distally into a posterior descending artery and posterior lateral  artery and also gives off an AV nodal branch.   Left ventriculography shows moderately decreased left ventricular systolic  function.  EF 35% with severe anterior apical hypokinesis.  Left ventricular  pressure 111/1 mmHg, aortic pressure  was 129/71 mmHg, LVEDP 11 mmHg.   ASSESSMENT:  1. One-vessel obstructive coronary disease of a diagonal with restenosis     proximal to the stent.  2. Moderately reduced left ventricular systolic function, ejection fraction     35% with anterior apical hypokinesis.   PLAN:  PCI of the diagonal per Dr. Katrinka Blazing.  Continue aspirin and Plavix and  all other medications.                                               Armanda Magic, M.D.    TT/MEDQ  D:  03/25/2004  T:  03/26/2004  Job:  [id number]

## 2011-03-21 NOTE — Procedures (Signed)
[facility]  Patient:    Caroline [name], Caroline [name] Visit Number: [id number] MRN: [id number]          Service Type: MED Location: [phone] Attending Physician:  Anastasio Auerbach Dictated by:   Petra Kuba, M.D. Proc. Date: 02/25/02 Admit Date:  02/23/2002   CC:         Anastasio Auerbach, M.D.  Crista Luria, M.D., [place], [facility]   Procedure Report  PROCEDURE:  Esophagogastroduodenoscopy with biopsy.  ENDOSCOPIST:  Petra Kuba, M.D.  INDICATIONS:  Significant upper tract symptoms.  CONSENT:  Consent was signed after benefits, risks, methods, options were thoroughly before any premedications were given.  MEDICATIONS USED:  Demerol 150 mg, Versed 15 mg.  DESCRIPTION OF PROCEDURE:  The video endoscope was inserted by direct vision. The proximal and mid esophagus was normal.  In the distal esophagus, there was some linear probable ulcerations covered with debris and some minimal inflammation.  Brushings for Candida and then biopsies at the end of the procedure were obtained.  The scope passed through the stomach and in through a normal antrum, normal pylorus into the duodenal bulb pertinent for a moderate amount of bulbitis and around the C-loop to a normal second portion of the duodenum.  The scope was withdrawn back to the bulb and a good look there ruled out abnormalities in that location.  The scope was drawn back to the stomach and retroflexed.  The angularis and fundus were normal.  High in the cardia the hiatal hernia was confirmed.  Lesser and greater curve were evaluated on retroflexion and straight visualization without additional findings.  One biopsy of the antrum for the CLO test was obtained to rule out Helicobacter.  The biopsies and brushings of the distal esophagus were obtained.  The scope was slowly withdrawn back to 20 cm which confirmed the above findings.  The scope was then removed after air was suctioned.   The patient tolerated the procedure well.  There was no obvious immediate complication.  ENDOSCOPIC DIAGNOSES: 1. Small hiatal hernia with significant distal linear ulceration, status    post biopsy and brushings. 2. Moderate bulbitis. 3. Otherwise normal esophagogastroduodenoscopy with CLO biopsy of the    antrum to rule out Helicobacter.  PLAN: 1. Pump inhibitors b.i.d. 2. Reglan or Zelnorm p.r.n. 3. Consider Carafate slurry or viscous lidocaine mix. 4. Slowly advance diet. 5. Hopefully she will be able to go home soon. 6. Dr. Luther Parody is on call this weekend, and we will ask him to stop    by tomorrow to review procedure and make sure no further work-up plans    are needed.  Supposedly, her ultrasound was reported as negative, and    we will need to see her back in order to check on the above but also    set up a colonoscopy versus BE. Dictated by:   Petra Kuba, M.D. Attending Physician:  Anastasio Auerbach DD:  02/25/02 TD:  02/26/02 Job: [id number]

## 2011-03-21 NOTE — Discharge Summary (Signed)
NAMEKALISA, GIRTMAN                ACCOUNT NO.:  [id number]   MEDICAL RECORD NO.:  [id number]          PATIENT TYPE:  INP   LOCATION:  6525                         FACILITY:  [facility]   PHYSICIAN:  Corinna L. Lendell Caprice, MDDATE OF BIRTH:  1951/09/14   DATE OF ADMISSION:  04/28/2008  DATE OF DISCHARGE:  05/02/2008                               DISCHARGE SUMMARY   DISCHARGE DIAGNOSES:  1. Chest pain with cardiac catheterization showing restenosis of left      anterior descending stent status post percutaneous coronary      intervention by Dr. Eldridge Dace.  Please see his notes.  2. Continued tobacco abuse.  3. History of medical noncompliance.  4. Hyperlipidemia.  5. Gastroesophageal reflux disease.  6. Congestive heart failure exacerbation, mild secondary to systolic      dysfunction.  7. Bright's disease.  8. Resolved hyponatremia, possibly secondary to polydipsia.  9. History of depression and anxiety.   DISCHARGE MEDICATIONS:  1. Aspirin 81 mg a day.  2. Plavix 75 mg a day.  3. Nitroglycerin sublingually as needed for chest pain.  4. Klonopin 2 mg p.o. t.i.d.  5. Seroquel 400 mg nightly.  6. Neurontin 600 mg p.o. t.i.d.  7. Hydroxyzine as needed.  8. She is to stop her metoprolol and any other blood pressure      medications.   Follow-up with Dr. Armanda Magic on July 14 at 9:30 a.m.   ACTIVITY:  No driving or heavy exertion for 3 days.   DIET:  Should be low-salt.   CONDITION:  Stable.   CONSULTATIONS:  Dr. Mayford Knife for seizures.  Cardiac catheterization and  PCI.  Please see Dr. Mayford Knife and Dr. Hoyle Barr notes for details.   HISTORY AND HOSPITAL COURSE:  Ms. Hellmer is a 60 year old white female  who presented with chest pain.  She also had been noted to have  hyponatremia and was instructed to present to the emergency room by Dr.  Mayford Knife.  She had an outpatient cardiac catheterization that had been  scheduled.  This was done as an inpatient.  After discussing further  with  the patient, she did report polydipsia drinking several gallons of  water a day.  Her sodium was noted to be 124.  EKG showed nothing acute  and unremarkable CBC.  At discharge, her sodium was 140.  Her liver  function tests were significant for an albumin of 3.0.  Cardiac enzymes  negative.  The day of discharge, her BNP was 442.  Her TSH was 2.042.  A.m. cortisol was 2.8 and she had a normal response to cosyntropin  stimulation.  She had on admission a stable chest x-ray with borderline  cardiomegaly and COPD on the day of discharge.  She had cardiomegaly and  Kerley B lines consistent with mild congestive heart failure.  The  patient did complain of some shortness of breath the night prior to  discharge, but this had resolved.  She had been cleared by cardiology to  be discharged.  I did, however, order a stat portable chest x-ray and  BNP.  She received a dose of  IV Lasix.  She was able to ambulate in the  halls, had stable vital signs, and was no longer short of breath, so she  was discharged to home per cardiology recommendations.  She did have  some brief hypotension during her hospitalization and received IV  fluids.  Dr. Mayford Knife after discussing the case with nephrology  recommended that the patient not be sent home on any blood pressure  medication due to renal issues.   Total time on the day of discharge is 40 minutes.      Corinna L. Lendell Caprice, MD  Electronically Signed     CLS/MEDQ  D:  05/08/2008  T:  05/09/2008  Job:  [id number]   cc:   Armanda Magic, M.D.  Dossie Der, MD

## 2011-03-21 NOTE — H&P (Signed)
NAME:  Caroline [name], Caroline [name] NO.:  [id number]   MEDICAL RECORD NO.:  [id number]          PATIENT TYPE:  EMS   LOCATION:  MAJO                         FACILITY:  [facility]   PHYSICIAN:  Ulyses Amor, MD DATE OF BIRTH:  1951/10/24   DATE OF ADMISSION:  10/01/2005  DATE OF DISCHARGE:                                HISTORY & PHYSICAL   Caroline [name] is a 60 year old white woman who is admitted to [facility] for further evaluation of chest pain.   The patient had a history of cardiac disease which dates back to February of  2005. At that time she suffered an acute anterolateral myocardial  infarction. She then underwent an stenting of a large codominant diagonal  vessel. In May 2005 she returned with chest pain and demonstrated evidence  of re-stenosis; a second stent was placed in that vessel.  She has done well  since then. In September 2005 she returned for chest pain.  A cardiac  catheterization demonstrated patency of the stented vessel. She was again  admitted in December of 2005 for chest pain at which time an acute  myocardial infarction was excluded.   The patient again returns for chest pain. This time she gives a two week  history of chest pain. She has experienced episodes of chest pain several  time a week. The episodes occur at random and appear to be related to  activity, meals, or respirations. Episodes may last several minutes to  several hours at a time. They are not affected by nitroglycerin. She reports  there is associated dyspnea and diaphoresis with these episodes. They  resolved spontaneously. The chest pain is described as sharp discomfort in  the left inframammary area. It radiates down the right arm. It is  exacerbated by movement of her torso. She believes the chest pain is similar  to that which heralded her acute myocardial infarction. The patient's chest  pain has largely resolved at this time.   There is no history of congestive  heart failure or arrhythmia. The patient  has no history of hypertension or diabetes mellitus. She does have a history  of hyperlipidemia. There is also a family history of early coronary artery  disease; she reports that her father suffered from coronary artery disease  in the 38s to 30s. The patient smoked nearly one pack of cigarettes per day.   In addition to the aforementioned problems, the patient has a history of  Bright's disease with mild renal insufficiency. She is status post right  breast lumpectomy for breast cancer. There is also a history of  gastroesophageal reflux. In addition, there is a significant and lengthy  history of depression.   MEDICATIONS:  Plavix, Coreg, aspirin, Effexor, Seroquel, Klonopin, and  Neurontin.   ALLERGIES:  AMOXICILLIN and CODEINE.   PAST SURGICAL HISTORY:  Right breast lumpectomy as described above.   FAMILY HISTORY:  Coronary artery disease as described above.   SOCIAL HISTORY:  The patient lives alone. She is widowed. She does not drink  alcohol. She smoked as described above.   REVIEW OF  SYSTEMS:  No knew problems related to her head, eyes, ears, nose,  mouth, throat, lungs, gastrointestinal system, genitourinary system, or  extremities. There is no history of neurologic or psychiatric disorder.  There is no history of fever, chills, or weight loss.   PHYSICAL EXAMINATION:  VITAL SIGNS: Blood pressure 86/56, pulse 89 and  regular, respirations 18, pulse oximetry 99% on room air.  GENERAL: The patient is a middle-age white woman in no discomfort. She is  alert, appropriate, and responsive.  HEENT: Normal.  NECK: Without thyromegaly or adenopathy. Carotid pulses are palpable  bilaterally and without bruits.  CARDIAC: Normal S1 and S2. There is no S3, S4, murmur, rub, or click.  Cardiac rhythm is regular. No chest wall tenderness is noted.  LUNGS: Clear.  ABDOMEN: Soft and nontender. There is no mass, hepatosplenomegaly, bruits,   distention, rebound, guarding, or rigidity. Bowel sounds are normal.  BREASTS/PELVIC/RECTAL EXAMS: Not performed as they were not pertinent for  acute care hospitalization.  EXTREMITIES: Without edema, deviation, or deformity. Radial and dorsalis  pedis pulses are palpable bilaterally.  NEUROLOGIC: Re-screening neurologic survey was unremarkable.   Electrocardiogram revealed normal sinus rhythm. There was mild inferior,  anterolateral T-wave inversions. A chest radiograph, according to the  radiologist, demonstrated no acute abnormalities. The initial set of cardiac  markers revealed a myoglobin of 90.8, CK-MB less than 1.0, and troponin less  than 0.05. BUN 21, potassium 4.1. White blood cell count 7.3, hemoglobin  11.7, hematocrit 34.3. The remaining studies were pending at the time of  this dictation.   IMPRESSION:  1.  Chest pain, rule out unstable angina. The discomfort is sharp in quality      and left inframammary in location. It radiates to the right arm. It is      exacerbated by turning her torso.  2.  Coronary artery disease. Status post anterolateral myocardial infarction      in February 2005 resulting in stenting of a large codominant diagonal.      Status post stenting for restenosis in May 2005. Last cardiac      catheterization was in September 2005 and demonstrated a patent stented      vessel. Last admission for chest pain was in December 2005.  3.  Dyslipidemia.  4.  Gastroesophageal reflux disease.  5.  Bright's disease.  6.  Status post right breast cancer.  7.  Depression.   PLAN:  1.  Telemetry.  2.  Serial cardiac enzymes.  3.  Aspirin.  4.  Plavix.  5.  Intravenous nitroglycerin.  6.  Intravenous heparin.  7.  Discontinuation of smoking discussed.  8.  Further measures per Dr. Mayford Knife.   This patient encounter was chaperoned by K. Robbins.      Ulyses Amor, MD Electronically Signed     MSC/MEDQ  D:  10/01/2005  T:  10/02/2005  Job:   [id number]   cc:   Armanda Magic, M.D.  Fax: [phone]

## 2011-03-21 NOTE — Consult Note (Signed)
[facility]  Patient:    Caroline [name], Caroline [name] Visit Number: [id number] MRN: [id number]          Service Type: MED Location: [phone] Attending Physician:  Anastasio Auerbach Dictated by:   Petra Kuba, M.D. Proc. Date: 02/25/02 Admit Date:  02/23/2002   CC:         Anastasio Auerbach, M.D.  Crista Luria, M.D. [facility]   Consultation Report  HISTORY:  A patient with a month or two of upper tract symptoms, atypical chest pain, odynophagia, failed outpatient therapy, has not been eating and got dehydrated and was admitted for further workup and plan. She has not had any previous GI tests, and I am consulted for consideration of endoscopy. She was also found to be mildly iron deficient. She does not have her periods anymore and will occasionally see some bright red blood on the toilet tissue.  PAST MEDICAL HISTORY: 1. History of depression. 2. Kidney disease. 3. Breast cancer.  FAMILY HISTORY:  Her mother with colon polyps.  HOME MEDICATIONS: 1. Effexor. 2. Clonazepam. 3. ______ -- as well as ______ samples.  ALLERGIES:  AMOXICILLIN and CODEINE.  SOCIAL HISTORY:  Smokes but does not drink.  PHYSICAL EXAMINATION:  GENERAL:  No acute distress.  LUNGS:  Clear.  HEART:  Regular rate and rhythm.  ABDOMEN:  Soft, nontender.  LABORATORY DATA:  Pertinent for negative CPKs and troponins on admission. Normal lipase. Liver Panel:  Albumin of 3. BUN 20, creatinine 1.4, hemoglobin 13.1 with MCV 88 which dropped to 11 with hydration, and her ferratin was slightly low at 40. She was guaiac negative in this hospitalization.  ASSESSMENT: 1. Significant upper tract symptoms. 2. Family history of colon polyps. 3. Personal history of breast cancer. 4. Some bright red blood per rectum. 5. Mild anemia. 6. Mild iron deficiency.  PLAN:  Risks, benefits, methods of endoscopy are discussed. We discussed how in the future she will need  a colonoscopy, and the risks and benefits of that were discussed. I would probably do that as an outpatient. Supposedly her ultrasound was reportedly negative, with further workup and plans pending findings at endoscopy. Dictated by:   Petra Kuba, M.D. Attending Physician:  Anastasio Auerbach DD:  02/25/02 TD:  02/25/02 Job: [id number]

## 2011-03-21 NOTE — Discharge Summary (Signed)
NAME:  Caroline [name], Caroline [name]                          ACCOUNT NO.:  [id number]   MEDICAL RECORD NO.:  [id number]                   PATIENT TYPE:  INP   LOCATION:  2005                                 FACILITY:  [facility]   PHYSICIAN:  Armanda Magic, M.D.                  DATE OF BIRTH:  March 02, 1951   DATE OF ADMISSION:  12/09/2003  DATE OF DISCHARGE:  12/13/2003                                 DISCHARGE SUMMARY   ADMISSION DIAGNOSES:  1. Chest pain, questionable non-Q wave myocardial infarction by cardiac     enzymes.  2. Gastroesophageal reflux disease.  3. Breast cancer.  4. Depression.   DISCHARGE DIAGNOSES:  1. Coronary artery disease, status post non-Q wave anterior wall myocardial     infarction with stent placement to the left anterior descending artery.  2. Tobacco abuse.  3. Gastroesophageal reflux disease.  4. Ischemic cardiomyopathy.   PROCEDURES:  Left heart catheterization with stent placement to the  LAD/diagonal system, December 10, 2003.   COMPLICATIONS:  None.   DISCHARGE STATUS:  Stable, improved.   HISTORY OF PRESENT ILLNESS:  Please see complete H&P for details, but, in  short, this is a 60 year old female who presented to the emergency room, on  December 09, 2003, with complaint of substernal chest pain.  She apparently  has had the same complaints in the past with no significant findings in the  workup.  Her pain began the night prior to admission.  She also had an  episode of nausea and vomiting just before that.  At the time she was seen  in the emergency room, she was pain free with only complaints of some mild  burning.   ADMISSION PHYSICAL EXAMINATION:  VITAL SIGNS:  Normal.  She was afebrile.  Physical exam was normal.  Please see complete H&P.   EKG showed a normal sinus rhythm with poor R wave progression, but no ST-T  wave changes.   ADMISSION LABS:  Showed a normal CBC and CMP.  Cardiac enzymes were all  elevated.  Initial set showed 929 CK with 191  MB, relative index of 20.6,  and a troponin of 7.84.   The patient was admitted to CCU for further treatment and evaluation.  Serial enzymes were ordered.  She was started on IV nitroglycerin and IV  heparin.  Plans for cardiac catheterization were set in motion.  She was  started on aspirin and low-dose beta-blocker.  Serial cardiac enzymes peaked  with a total CK of 1386, with 202 MB, relative index at 14.6, and a troponin  of 27.53.  Hemodynamically, she was stable with normal vital signs.  The  patient remained pain free on IV nitroglycerin.  EKG was unchanged.  IV  Integrilin was initiated, in addition to her current therapy.  Lipid profile  showed total of 941, with triglycerides 439, and a HDL of 37, LDL was not  able  to be calculated.  The patient was noted to be on multiple  antipsychotic medications which can effect cholesterol levels.  Dr. Mayford Knife  will plan to discuss with that physician.  In the meantime, she was started  on Zocor.   Dr. Mayford Knife spoke with Dr. Tomasa Blase in regards to the patient's history of  Bright's disease.  Her renal function was normal.  However, prior to  catheterization, it was felt that she needed some IV hydration; therefore,  she was given a 500 cc bolus of normal saline.  She was also given Mucomyst.  A psych consult was also obtained with her multiple medications.  They made  no recommendations as to changing medications and recommended that she be  continued on all of her current medications.   The patient was taken to the cardiac cath lab, on December 10, 2003, by Dr.  Mayford Knife.  Results showed one vessel obstructive CAD with a totally occluded  LAD, just after the take off of the diagonals.  Circumflex was widely  patent.  RCA was widely patent with only a 30-40% area noted in the very  distal part.  She had moderate LV dysfunction with an EF of 35-40% with  anterolateral and apical akinesis.  Dr. Francisca December was consulted and  agreed to  proceed with PCI.  She had TAXUS stent placed to the LAD.  She  tolerated the procedure well.  Angio-Seal was used post procedure, and she  had good hemostasis.  Vital signs were stable.  IV fluids were continued at  150 cc/hr for eight hours post cath.  She was started on Plavix at this  point, and her Lopressor was changed to Coreg for LV dysfunction.  She also  received another dose of Mucomyst.   For the rest of her admission, she was essentially pain free and without any  physical complaints.  Vital signs remained stable.  Cardiac enzymes were  trending down.  On December 11, 2003, she had 266 CK with 15.7 MB, and a  troponin of 15.84.  Physical exam was within normal limits.  Her right groin  cath site was without bruising.  She was transferred from CCU to telemetry.  Smoking cessation consult was also obtained.  The patient essentially had no  further incidents during the rest of her admission.  Her dose of Altace was  decreased from 5 mg to 2.5 mg secondary to mild asymptomatic hypotension.  She was discharged home without incident.   DISCHARGE MEDICATIONS:  1. Effexor as taken previously.  2. Seroquel as taken previously.  3. Klonopin as taken previously.  4. Plavix 75 mg daily.  5. Aspirin 325 mg daily.  6. Coreg 6.25 mg b.i.d.  7. Altace 5 mg one half tablet daily.  8. Nitroglycerin 0.4 mg p.r.n.  9. Zocor 40 mg daily.   She is not to undertake any strenuous activity until she sees Dr. Mayford Knife for  followup.  She is not to return to work until she sees Dr. Mayford Knife for  followup.   She is to maintain a low salt, low fat, low cholesterol diet.   She has an appointment with Dr. Eliott Nine at [facility] on Monday, January 08, 2004 at 3:30 p.m.  She has an appointment to see Dr. Mayford Knife for followup  on Thursday, December 28, 2003 at 12 p.m.      Adrian Saran, [place].P.  Armanda Magic, M.D.   HB/MEDQ  D:  01/15/2004  T:  01/16/2004  Job:  [id number]   cc:    Armanda Magic, M.D.  [address], [address]  [place], [place] [address]  Fax: [phone]   Duke Salvia. Eliott Nine, M.D.  [address]  [place]  [place] [address]  Fax: [phone]

## 2011-03-21 NOTE — Discharge Summary (Signed)
NAME:  Caroline [name], Caroline [name]                          ACCOUNT NO.:  [id number]   MEDICAL RECORD NO.:  [id number]                   PATIENT TYPE:  INP   LOCATION:  6523                                 FACILITY:  [facility]   PHYSICIAN:  Armanda Magic, M.D.                  DATE OF BIRTH:  October 10, 1951   DATE OF ADMISSION:  03/22/2004  DATE OF DISCHARGE:  03/27/2004                                 DISCHARGE SUMMARY   PRIMARY CARE PHYSICIAN:  Sharlet Salina, M.D.   CHIEF COMPLAINT AND REASON FOR ADMISSION:  This 60 year old female with  history of depression and CAD status post anterolateral MI February 2005  with subsequent stent to the diagonal 1 was admitted on Mar 22, 2004,  secondary to complaints of substernal chest pain unrelieved by  nitroglycerin.  Subsequent cardiac isoenzymes were negative and the patient  was admitted for observation and possible cardiac catheterization.   HOSPITAL COURSE:  Problem #1. Chest pain.  Known CAD with MI and diagonal  stent February 2005.  Again, the patient was admitted on Mar 22, 2004, where  she continued to have intermittent bouts of mild chest pain.  Cardiac  isoenzymes remained normal.  EKG initially did demonstrate a septal MI with  ST segments decreased in V3 and V5.  She was started on Lovenox, aspirin,  and Plavix upon admission.  At one point, it was considered that she may  just have unstable angina but since chest pain persisted, she was taken to  the catheterization lab on Mar 25, 2004, by Armanda Magic, M.D., at which  point she was found to have restenosis of the diagonal 1 just proximal to  the prior stent.  Dr. Katrinka Blazing subsequently did PTCA and placement of an  additional CYPHER stent in the diagonal 1 and it was recommended that the  patient remain on Plavix for at least six months post procedure.  Post  catheterization, the patient did well, no further problems with chest pain,  no EKG changes.  Right groin remained stable.  From a cardiac  standpoint,  the patient was ready for discharge home on Mar 26, 2004, but was kept an  additional 24 hours in the hospital due to issues related to mild elevation  of creatinine secondary to the patient's underlying Bright disease.   Problem #2. Bright disease with associated renal insufficiency.  Since  admission, the patient had been having difficulty with back discomfort not  relieved by Tylenol.  She had been given one to two doses of Toradol and in  the post catheterization period in conjunction with receipt of IV contrast,  the patient's creatinine increased to 1.7.  At that point, the Toradol was  discontinued.  The patient was gently hydrated with IV fluids and BUN and  creatinine were repeated on Mar 27, 2004, which showed the creatinine was  trending down to 1.5.  The patient was  deemed appropriate for discharge with  recommendation to the patient not to take over the counter or prescriptive  NSAID class of medications in the future.   ADMISSION DIAGNOSES:  1. Chest pain.  2. 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  3. Stent to diagonal 1 in February 2005.  4. Dyslipidemia.  5. History of breast cancer.  6. Bright disease.  7. Depression.  8. Gastroesophageal reflux disease with esophagitis.   DISCHARGE DIAGNOSES:  1. Chest pain with normal cardiac markers, no evidence of acute myocardial     infarction.  2. 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 Katrinka Blazing.  3. Bright disease with renal insufficiency.  4. Esophagitis with stable hemoglobin.  5. Dyslipidemia on statin medicine.  6. Depression.   DISCHARGE MEDICATIONS:  1. Seroquel 300 mg __________ sleep.  2. Klonopin 2 mg t.i.d.  3. Plavix 75 mg daily.  4. Coreg 6.25 mg b.i.d.  5. Altace 5 mg daily.  6. Zocor 80 mg nightly.  7. Nexium 40 mg daily.  8. Effexor 150 mg t.i.d.  9. Seroquel 50 mg b.i.d.   ACTIVITY:   Return to previous level of functioning.   DIET:  Cardiac healthy diet.   FOLLOW UP:  1. Armanda Magic, M.D., Wednesday, June 8 at 1:45 p.m.  2. Follow up labs BMET in one week on Wednesday, June 1, anytime after 7:30     a.m.      Allison L. Ulla Gallo, M.D.    ALE/MEDQ  D:  04/23/2004  T:  04/24/2004  Job:  [id number]   cc:   Sharlet Salina, M.D.  [address]  [place]  [place] [address]  Fax: [phone]

## 2011-03-21 NOTE — H&P (Signed)
Caroline [name], Caroline [name]                ACCOUNT NO.:  [id number]   MEDICAL RECORD NO.:  [id number]          PATIENT TYPE:  INP   LOCATION:  6524                         FACILITY:  [facility]   PHYSICIAN:  Corinna L. Lendell Caprice, MDDATE OF BIRTH:  Apr 13, 1951   DATE OF ADMISSION:  10/04/2004  DATE OF DISCHARGE:  10/05/2004                                HISTORY & PHYSICAL   CHIEF COMPLAINT:  Chest pain.   HISTORY OF PRESENT ILLNESS:  Caroline [name] is a 60 year old white female patient  with history of coronary artery disease and stent placement who presents to  the emergency room with substernal chest pain which was similar to previous  ischemic episodes.  She had cardiac catheterization July 31, 2004, by  Dr. Mayford Knife which showed a widely patent stent.  She had a normal VQ scan at  that time and was sent home on Protonix.  The patient took 3 nitroglycerin  today.  She has no chest pain currently.  Since being discharged in  September, she reports that she has taken nitroglycerin several times.  She  was getting out of her truck when the chest pain occurred.  She also reports  nausea according to the triage nurse.  The patient is quite groggy and seems  to be having difficulty providing a complete history.   PAST MEDICAL HISTORY:  1.  Coronary artery disease status post stent placement in May 2005.  2.  Depression and anxiety.  3.  Bright's disease.  4.  Hyperlipidemia.   MEDICATIONS:  1.  Effexor XR 150 mg p.o. q.a.m., 75 mg p.o. q.h.s.  2.  Altace 5 mg p.o. daily.  3.  Seroquel 300 mg p.o. q.h.s.  4.  Aspirin 325 mg p.o. daily.  5.  Clonazepam 2 mg p.o. t.i.d.  6.  Plavix 75 mg p.o. daily.  7.  Coreg 6.25 mg p.o. b.i.d.  8.  Neurontin 300 mg p.o. t.i.d.  9.  Zocor 80 mg p.o. daily.  10. Tylenol ES p.r.n.  11. Afrin nose spray.   SOCIAL HISTORY:  The patient continues to smoke one-half pack cigarettes a  day.  She denies alcohol or drug use.   FAMILY HISTORY:  Noncontributory.   REVIEW OF SYSTEMS:  As above, otherwise negative.   PHYSICAL EXAMINATION:  VITAL SIGNS:  Her blood pressure initially was 85/57,  temperature 97.0, pulse 84, respiratory rate 16, oxygen saturation 93% on  room air.  GENERAL:  The patient is a somnolent, quiet female who appears quite  sedated.  Her speech is rambling, slow, and slightly slurred.  The nurse  reports that the patient was like this upon presentation to the emergency  room and that this has not changed since she has been here.  HEENT:  Normocephalic and atraumatic.  Pupils equal, round, and reactive to  light.  Sclerae anicteric.  She has dry mucous membranes.  NECK:  Supple.  No carotid bruits or lymphadenopathy.  LUNGS:  Clear to auscultation bilaterally without wheeze, rhonchi, or rales.  CARDIOVASCULAR:  Regular rate and rhythm without murmurs, gallops, or rubs.  She has no chest  wall tenderness.  ABDOMEN:  Soft, nontender, nondistended.  GU/RECTAL:  Deferred.  EXTREMITIES:  No clubbing, cyanosis, or edema.  No calf tenderness.  Pulses  are intact.  SKIN:  No rash.  PSYCHIATRIC:  The patient has very flat affect.  NEUROLOGIC:  Somnolent as above.  Cranial nerve, sensory, motor exams are  intact.   LABORATORY AND X-RAY DATA:  First set of cardiac enzymes are normal.  The pH  was 7.317, PCO2 51.  CBC significant for a hemoglobin of 11.9, hematocrit  35, otherwise unremarkable.  BUN 29, creatinine 1.7 both of which are higher  than previously.  The rest of her BMET is within normal limits.   EKG shows normal sinus rhythm with nonspecific T wave changes.   Chest x-ray shows nothing acute.   IMPRESSION AND PLAN:  1.  Chest pain in patient with coronary artery disease and stent but with      normal catheterization two and one-half months ago.  The patient will be      admitted to telemetry, rule out for myocardial infarction, continue      aspirin.  I will not give any further nitroglycerin due to her       hypotension.  Dr. Elease Hashimoto is on call for Dr. Mayford Knife, but Dr. Mayford Knife will      be on duty tomorrow.  It may be reasonable to discuss the case with her      in terms of whether or not further workup is needed.  2.  Hypotension, probably secondary to combination of nitroglycerin and      dehydration given examination and prerenal azotemia.  The patient's      blood pressure medications will be held.  She will get IV fluids.  3.  Acute renal insufficiency, most likely prerenal azotemia.  4.  Bright's disease.  5.  Depression and anxiety.  6.  Somnolence.  The patient appears to be on sedatives or over medicated.      I will check a blood alcohol level and urine drug screen.      Cori   CLS/MEDQ  D:  10/04/2004  T:  10/05/2004  Job:  [id number]   cc:   Armanda Magic, M.D.  [address]  [place], [place] [address]  Fax: [phone]   Sharlet Salina, M.D.  [address]  [place]  [place] [address]  Fax: [phone]

## 2011-03-21 NOTE — Cardiovascular Report (Signed)
NAMEMarland Kitchen  Caroline [name], Caroline [name] NO.:  [id number]   MEDICAL RECORD NO.:  [id number]          PATIENT TYPE:  INP   LOCATION:  6523                         FACILITY:  [facility]   PHYSICIAN:  Lyn Records, M.D.   DATE OF BIRTH:  June 07, 1951   DATE OF PROCEDURE:  10/03/2005  DATE OF DISCHARGE:                              CARDIAC CATHETERIZATION   INDICATIONS:  Focal in-stent restenosis in the first diagonal within a  region of previously treated Cypher stenting overlapping a Taxus stent. The  clinical indication for the Cypher stent was restenosis proximal to the  Taxus stent in 2005.   PROCEDURE:  Cutting balloon angioplasty of focal in-stent restenosis within  Cypher stent in the proximal diagonal-1.   DESCRIPTION:  Dr. Mayford Knife did the diagnostic procedure. We used a 6-French XB  3.0 guide catheter, gave 2000 units of IV heparin, documented ACT greater  than 230 and also given double bolus followed by an infusion of Integrilin  and gave 600 milligrams of oral Plavix. We then performed cutting balloon  angioplasty within the previously placed stent using a 3.0 x 6 mm long  cutting balloon to 6 atmospheres for a total of 2 minutes with reduction 80%  focal stenosis to 0% and maintenance of TIMI grade III flow. The patient had  mild chest tightness. No severe sharp pain as she had on admission raising  question concerning the etiology of the sharp discomfort. AngioSeal  arteriotomy closure was performed after documentation of accessibility and  adequate sheath position.   CONCLUSION:  1.  Successful cutting balloon angioplasty of focal in-stent restenosis      within a Cypher drug-eluting stent with reduction of focal stenosis from      80% to 0% with maintenance of TIMI III flow.  2.  Successful Angio-Seal.   PLAN:  Aspirin, Plavix should be continued for 2-3 weeks. Further management  per Dr. Mayford Knife.      Lyn Records, M.D.  Electronically Signed     HWS/MEDQ  D:   10/03/2005  T:  10/04/2005  Job:  [id number]   cc:   Armanda Magic, M.D.  Fax: [phone]   Primary care physician

## 2011-03-21 NOTE — Cardiovascular Report (Signed)
NAME:  JENAVEVE, FENSTERMAKER                          ACCOUNT NO.:  [id number]   MEDICAL RECORD NO.:  [id number]                   PATIENT TYPE:  AMB   LOCATION:  ENDO                                 FACILITY:  [facility]   PHYSICIAN:  Armanda Magic, M.D.                  DATE OF BIRTH:  [birth year]   DATE OF PROCEDURE:  12/10/2003  DATE OF DISCHARGE:                              CARDIAC CATHETERIZATION   PROCEDURE:  Left heart catheterization, coronary angiography, left  ventriculography.   OPERATOR:  Armanda Magic, M.D.   INDICATIONS:  MI.   COMPLICATIONS:  None.   IV ACCESS:  Via right femoral artery, 6-French sheath.   This is a 60 year old female with no previous cardiac history, but a history  apparently of hyperlipidemia as a child, per the patient.  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  She presented seven hours into her pain.  She now  presents for cath due to recurrent chest pain.   The patient is brought to the cardiac catheterization laboratory in the  fasting, non-sedated state.  Informed consent was obtained.  The patient was  connected to continuous heart rate and pulse oximetry monitoring and  intermittent blood pressure monitoring.  The right groin was prepped and  draped in the sterile fashion.  One percent Xylocaine was used for local  anesthesia.  Using modified Seldinger technique, a 6-French sheath was  placed in the right femoral artery.  Under fluoroscopic guidance, a 6-French  JL-4 catheter was placed under fluoroscopic guidance into the left coronary  artery.  Multiple cine films were taken in 30-degree RAO, 40-degree LAO  views.  This catheter was then exchanged out over a guidewire for a 6-French  JR-4 catheter which was placed under fluoroscopic guidance into the right  coronary artery.  Multiple cine films were  taken in 30-degree RAO, 40-degree  LAO views.  This catheter was exchanged out over a guidewire for a 6-French  angled pigtail catheter which was placed under fluoroscopic guidance in the  left ventricular cavity.  Left ventriculography was performed using a total  of 30 cc of contrast at 15 cc/hr.  The catheter was then pulled back across  the aortic valve with no significant gradient noted.  The catheter was then  removed.  The patient then went onto PCI of the LAD, per Dr. Ty Hilts.   RESULTS:  1. Left main coronary artery is widely patent and bifurcates into the left     anterior descending artery and left circumflex artery.  The left     circumflex artery is widely patent and gives off one large obtuse     marginal branch which is widely patent and bifurcates distally into two     daughter branches __________  circumflex traverses the __________ groove.     The proximal LAD is widely patent and then gives rise to a very large     septal perforator which immediately bifurcates into a very large branch     which is essentially a co-LAD system and it is widely patent and     traverses to the apex.  The LAD at that point also gives off a first     diagonal branch which is widely patent and then just distal to that     takeoff the LAD is occluded.  There is some very faint filling to the     distal LAD via left-to-left collaterals.  2. The right coronary artery is widely patent throughout its course and     bifurcates distally in the posterolateral branch of the posterior     descending artery.  The posterior descending artery has a proximal 30-40%     narrowing.  3. Left ventriculography shows moderate LV dysfunction, EF 35-40% with     anterolateral and apical akinesis.  LV pressure was 119/17 with an LVEDP     of 28 mmHg.  Aortic pressure was 119/75 mmHg.   ASSESSMENT:  1. One vessel obstructive coronary disease with acute LAD in the setting of     a non-ST elevation MI.  2. Moderate  LV dysfunction, EF 35-40% with anterolateral apical akinesis.  3. Hyperlipidemia.  Zocor has been started.   PLAN:  1. PCI of the LAD per Dr. Amil Amen.  Change Lopressor to Coreg 3.125 mg     b.i.d., aspirin, and Plavix.  Altace 2.5 a day for afterload reduction     and titrate up as blood pressure tolerates.  2. IV fluids 150 cc/hr for 8 hours per renal, due to patient's history of     Bright's disease.  We will also give her 600 mg of Mucomyst six hours     after the cath.                                               Armanda Magic, M.D.    TT/MEDQ  D:  12/10/2003  T:  12/11/2003  Job:  [id number]   cc:   Sharlet Salina, M.D.  [address]  [place]  [place] [address]  Fax: [phone]

## 2011-03-21 NOTE — Telephone Encounter (Signed)
Pt called and said last time in hosp was 2010.  She has been getting Plavix thru company.  She got a letter in the mail saying that medication was going to generic. Will she get any more thru mail or will she have to pay for this? She does not have a drug plan.

## 2011-03-21 NOTE — Cardiovascular Report (Signed)
Caroline [name], Caroline [name] NO.:  [id number]   MEDICAL RECORD NO.:  [id number]          PATIENT TYPE:  INP   LOCATION:  6523                         FACILITY:  [facility]   PHYSICIAN:  Armanda Magic, M.D.     DATE OF BIRTH:  10/20/1951   DATE OF PROCEDURE:  10/03/2005  DATE OF DISCHARGE:  10/04/2005                              CARDIAC CATHETERIZATION   REFERRING PHYSICIAN:  Sharlet Salina, M.D.   PROCEDURE:  Coronary angiography.   OPERATOR:  Armanda Magic, M.D.   COMPLICATIONS:  None.   IV access via the right femoral artery, 6-French sheath, IV contrast 100 cc.   This is a 60 year old female with a history of coronary disease, status post  PTCA stenting of a large diagonal branch, who now presents with recurrence  of chest pain.  She now presents for cardiac catheterization.   The patient is brought to the cardiac catheterization laboratory in the  fasting non-sedated state.  Informed consent was obtained.  The patient was  connected to continuous heart rate and pulse oximetry monitoring,  intermittent blood pressure monitoring.  The right groin was prepped and  draped in a sterile fashion.  Xylocaine 1% was used for local anesthesia.  Using the modified Seldinger technique, a 6-French sheath was placed in the  right femoral artery.  Under fluoroscopic guidance, a 6-French JL-4 catheter  was attempted to be placed in the left coronary ostium but could not engage  the ostium correctly.  The catheter was exchanged over a guidewire for a 6-  [place] JR-3.5 catheter which successfully engaged the left coronary ostium.  Multiple cine films were taken at 30 degree RAO, 40 degree LAO views. This  catheter was then exchanged out over a guidewire for a 6-French JR-4  catheter which was placed under fluoroscopic guidance in the right coronary  artery.  Multiple cine films were taken at 30 degree RAO, 40 degree LAO  views.  This catheter was then removed over a guide wire.  At  the end of the  procedure, the patient underwent PCI of the diagonal by Dr. Katrinka Blazing.   RESULTS:  1.  The left main coronary artery is widely patent.  It bifurcates in the      left anterior descending artery and the left circumflex artery.      1.  The left anterior descending artery is a small artery and gives rise          to a very large diagonal branch which traverses the apex.  There is          evidence of previous two prior stents in the diagonal and there is a          focal stenosis of 80% in the proximal portion of the first stent.      2.  The left circumflex is widely patent throughout its course, giving          rise to one obtuse marginal branch which is widely patent.  The          ongoing left circumflex is widely patent  and traverses the AV          groove.  2.  The right coronary artery has luminal irregularities with one focal      narrowing in the mid to distal portion of 20-30% before bifurcating into      a posterior descending artery and posterolateral artery both of which      are widely patent.   Left ventriculography was not performed secondary to the patient's history  of renal insufficiency.   ASSESSMENT:  1.  One vessel obstructive coronary disease with restenosis of the proximal      stent of diagonal-1.  2.  Unstable angina pectoris secondary to number one.  3.  Bright disease with normal creatinine today after IV fluids.  4.  Ongoing tobacco abuse.  5.  Dyslipidemia.   PLAN:  1.  PCI the diagonal-1 number per Dr. Katrinka Blazing today.  2.  Aspirin and Plavix.  3.  Recheck fasting lipid panel.  4.  Discussed smoking cessation.      Armanda Magic, M.D.  Electronically Signed     TT/MEDQ  D:  10/07/2005  T:  10/07/2005  Job:  [id number]   cc:   Sharlet Salina, M.D.  Fax: [phone]

## 2011-04-22 ENCOUNTER — Telehealth: Payer: Self-pay | Admitting: *Deleted

## 2011-04-22 NOTE — Telephone Encounter (Signed)
Spoke with pt, she is aware plavix at the front desk for pick up Caroline [name]

## 2011-05-02 ENCOUNTER — Telehealth: Payer: Self-pay | Admitting: Cardiovascular Disease

## 2011-05-02 NOTE — Telephone Encounter (Signed)
Pt wants to talk about generic palvix

## 2011-05-02 NOTE — Telephone Encounter (Signed)
Spoke with pt, she is requesting a script for plavix. The pharm is going to work with her to give her a discount on plavix. Script placed with her samples at the front desk Caroline [name]

## 2011-07-31 LAB — ACTH STIMULATION, 3 TIME POINTS
Cortisol, 30 Min: 25.9 (ref >20)
Cortisol, 60 Min: 29.5 (ref >20)
Cortisol, Base: 9.7

## 2011-07-31 LAB — BASIC METABOLIC PANEL
BUN: 13
BUN: 14
CO2: 21
Calcium: 8.8
Calcium: 8.9
Calcium: 9.1
Chloride: 109
Chloride: 114 — ABNORMAL HIGH
Creatinine, Ser: 0.99
Creatinine, Ser: 1.21 — ABNORMAL HIGH
GFR calc Af Amer: >60
GFR calc Af Amer: >60
GFR calc Af Amer: >60
GFR calc non Af Amer: 51 — ABNORMAL LOW
GFR calc non Af Amer: 58 — ABNORMAL LOW
GFR calc non Af Amer: >60
Potassium: 3.7
Potassium: 4.2
Sodium: 124 — ABNORMAL LOW
Sodium: 137

## 2011-07-31 LAB — CK TOTAL AND CKMB (NOT AT ARMC)
CK, MB: 1.8
Relative Index: INVALID
Total CK: 60

## 2011-07-31 LAB — CBC
HCT: 34.1 — ABNORMAL LOW
HCT: 34.5 — ABNORMAL LOW
Hemoglobin: 11.2 — ABNORMAL LOW
Hemoglobin: 11.8 — ABNORMAL LOW
Hemoglobin: 11.8 — ABNORMAL LOW
MCHC: 33.3
MCHC: 33.9
MCHC: 34.5
MCV: 85.7
MCV: 85.9
MCV: 86.1
Platelets: 246
RBC: 3.83 — ABNORMAL LOW
RBC: 3.96
RBC: 3.97
RDW: 14.1
RDW: 14.4
RDW: 14.9
WBC: 6.4
WBC: 7.8
WBC: 8.2

## 2011-07-31 LAB — COMPREHENSIVE METABOLIC PANEL
ALT: 11
AST: 19
CO2: 25
Calcium: 9
Chloride: 101
Creatinine, Ser: 1.05
GFR calc Af Amer: >60
GFR calc non Af Amer: 55 — ABNORMAL LOW
Glucose, Bld: 97
Total Bilirubin: 0.6

## 2011-07-31 LAB — POCT CARDIAC MARKERS
CKMB, poc: 1.2
Myoglobin, poc: 60.3
Troponin i, poc: <0.05

## 2011-07-31 LAB — PROTIME-INR: INR: 0.9

## 2011-07-31 LAB — B-NATRIURETIC PEPTIDE (CONVERTED LAB): Pro B Natriuretic peptide (BNP): 442 — ABNORMAL HIGH

## 2011-07-31 LAB — CORTISOL-AM, BLOOD: Cortisol - AM: 2.8 — ABNORMAL LOW

## 2011-07-31 LAB — APTT: aPTT: 37

## 2011-07-31 LAB — TSH: TSH: 2.042

## 2011-08-12 ENCOUNTER — Ambulatory Visit: Payer: Medicare Other | Admitting: Cardiovascular Disease

## 2011-09-04 ENCOUNTER — Ambulatory Visit: Payer: Medicare Other | Admitting: Cardiovascular Disease

## 2011-10-21 ENCOUNTER — Ambulatory Visit: Payer: Medicare Other | Admitting: Cardiovascular Disease

## 2011-11-27 ENCOUNTER — Ambulatory Visit: Payer: Medicare Other | Admitting: Cardiovascular Disease

## 2011-12-09 ENCOUNTER — Encounter: Payer: Self-pay | Admitting: Cardiovascular Disease

## 2011-12-09 ENCOUNTER — Ambulatory Visit (INDEPENDENT_AMBULATORY_CARE_PROVIDER_SITE_OTHER): Payer: Medicare Other | Admitting: Cardiovascular Disease

## 2011-12-09 DIAGNOSIS — I10 Essential (primary) hypertension: Secondary | ICD-10-CM

## 2011-12-09 DIAGNOSIS — E785 Hyperlipidemia, unspecified: Secondary | ICD-10-CM

## 2011-12-09 DIAGNOSIS — I2510 Atherosclerotic heart disease of native coronary artery without angina pectoris: Secondary | ICD-10-CM

## 2011-12-09 DIAGNOSIS — R0989 Other specified symptoms and signs involving the circulatory and respiratory systems: Secondary | ICD-10-CM | POA: Insufficient documentation

## 2011-12-09 NOTE — Patient Instructions (Signed)
Your physician wants you to follow-up in: YEAR WITH DR NISHAN  You will receive a reminder letter in the mail two months in advance. If you don't receive a letter, please call our office to schedule the follow-up appointment. Your physician recommends that you continue on your current medications as directed. Please refer to the Current Medication list given to you today. Your physician has requested that you have a carotid duplex. This test is an ultrasound of the carotid arteries in your neck. It looks at blood flow through these arteries that supply the brain with blood. Allow one hour for this exam. There are no restrictions or special instructions. DX BRUIT 

## 2011-12-09 NOTE — Assessment & Plan Note (Signed)
Stable with no angina and good activity level.  Continue medical Rx  

## 2011-12-09 NOTE — Assessment & Plan Note (Signed)
Cholesterol is at goal.  Continue current dose of statin and diet Rx.  No myalgias or side effects.  F/U  LFT's in 6 months. Lab Results  Component Value Date   LDLCALC  Value: 201        Total Cholesterol/HDL:CHD Risk Coronary Heart Disease Risk Table                     Men   Women  1/2 Average Risk   3.4   3.3  Average Risk       5.0   4.4  2 X Average Risk   9.6   7.1  3 X Average Risk  23.4   11.0        Use the calculated Patient Ratio above and the CHD Risk Table to determine the patient's CHD Risk.        ATP III CLASSIFICATION (LDL):  <100     mg/dL   Optimal  454-098  mg/dL   Near or Above                    Optimal  130-159  mg/dL   Borderline  119-147  mg/dL   High  >829     mg/dL   Very High* 5/62/1308

## 2011-12-09 NOTE — Progress Notes (Signed)
Caroline [name] is S/P recent reintervention to her diagonal branch by Dr. Tommy Medal 2011. unfortunately she has some psychiatric issues and financial issues and is noncompliant with her medications. He has not been taking aspirin. Continues to smoke. Her counselor for less than 10 minutes. Hopefully she will start nicorrette gum which seems to be her choice. Encouraged her to go to [facility] for [employer] and get the least expensive nicotine patches she can climb. Her psychiatric medicines I would be hesitant to start her on Wellbutrin or Chantix in addition to her other medications that are centrally acting. Were able to get assistance for her Plavix and she picked up samples recently. She has a long-standing relationship with a psychiatrist that sees her every month or two. otherwise she's not had a significant palpitations PND or orthopnea no syncope or lower extremity edema. She is complaining about her teeth and may need them pulled. I think she will be ok stopping her Plavix for this althought she really is at risk for recurrent events given her smoking  Ok to stop Plavix as her redo stent was over two years ago  ROS: Denies fever, malais, weight loss, blurry vision, decreased visual acuity, cough, sputum, SOB, hemoptysis, pleuritic pain, palpitaitons, heartburn, abdominal pain, melena, lower extremity edema, claudication, or rash.  All other systems reviewed and negative  General: Affect odd but  appropriate Healthy:  appears stated age HEENT: normal Neck supple with no adenopathy JVP normal right  bruits no thyromegaly Lungs clear with no wheezing and good diaphragmatic motion Heart:  S1/S2 no murmur, no rub, gallop or click PMI normal Abdomen: benighn, BS positve, no tenderness, no AAA no bruit.  No HSM or HJR Distal pulses intact with no bruits No edema Neuro non-focal Skin warm and dry No muscular weakness   Current Outpatient Prescriptions  Medication Sig Dispense Refill  . aspirin 325  MG tablet Take 325 mg by mouth daily.      . clonazepam (KLONOPIN) 2 MG disintegrating tablet Take 2 mg by mouth 3 (three) times daily as needed.       . clopidogrel (PLAVIX) 75 MG tablet Take 1 tablet (75 mg total) by mouth daily.  30 tablet  3  . gabapentin (NEURONTIN) 300 MG capsule Take 300 mg by mouth 3 (three) times daily.        . isosorbide mononitrate (IMDUR) 30 MG 24 hr tablet Take 30 mg by mouth daily.        . Omega-3 Fatty Acids (FISH OIL) 1000 MG CAPS Take 1 capsule by mouth 2 (two) times daily.        . QUEtiapine (SEROQUEL) 400 MG tablet Take 400 mg by mouth at bedtime.        [name] QUEtiapine Fumarate (SEROQUEL XR) 150 MG TB24 1 tab po qd        Allergies  Amoxicillin and Codeine  Electrocardiogram:  NSR rate 105  LVH with strain RAE inferolateral T wave inversions T wave changes more prominent in lateral leads compared to 2011  Assessment and Plan

## 2011-12-09 NOTE — Assessment & Plan Note (Signed)
New right bruit  Duplex and continue risk factor modification

## 2011-12-16 ENCOUNTER — Encounter: Payer: Medicare Other | Admitting: Cardiology

## 2011-12-16 ENCOUNTER — Other Ambulatory Visit: Payer: Self-pay | Admitting: Cardiovascular Disease

## 2011-12-16 MED ORDER — ISOSORBIDE MONONITRATE ER 30 MG PO TB24: 30.0000 mg | ORAL_TABLET | Freq: Every day | ORAL | Status: DC

## 2011-12-24 ENCOUNTER — Telehealth: Payer: Self-pay | Admitting: *Deleted

## 2011-12-24 ENCOUNTER — Encounter (INDEPENDENT_AMBULATORY_CARE_PROVIDER_SITE_OTHER): Payer: Medicare Other

## 2011-12-24 DIAGNOSIS — R0989 Other specified symptoms and signs involving the circulatory and respiratory systems: Secondary | ICD-10-CM

## 2011-12-24 DIAGNOSIS — I6529 Occlusion and stenosis of unspecified carotid artery: Secondary | ICD-10-CM

## 2011-12-24 NOTE — Telephone Encounter (Signed)
SPOKE WITH ANNETTE AT DR EARLY'S OFFICE  APPT SCHEDULED FOR  01-20-12 AT 10;45  PT AWARE. WILL FORWARD TO DR Eden Emms  TO MAKE SURE THIS DATE IS OKAY  ANNETTES NUMBER IS [phone]./CY

## 2011-12-24 NOTE — Telephone Encounter (Signed)
Sent note to Dr Early to get her in sooner than 3/19 with him Dr Edilia Bo or Hart Rochester

## 2011-12-25 NOTE — Telephone Encounter (Signed)
SPOKE WITH ANNETTE THIS AM WAS ABLE TO GET APPT FOR 01-06-12  AT 8:30 AM WITH DR Hart Rochester . LEFT MESSAGE FOR PT TO CALL BACK  WITH NEW APPT DATE, TIME, AND MD .Zack Seal

## 2011-12-26 NOTE — Telephone Encounter (Signed)
Follow up from previous call:  Patient calling back regarding status of appt with the surgeon.

## 2011-12-26 NOTE — Telephone Encounter (Signed)
Patient is aware that she has an appointment with Dr.Lawson VS on 01/06/12 at 8:30 AM. VS  Office phone  number given to pt.

## 2012-01-05 ENCOUNTER — Encounter: Payer: Self-pay | Admitting: Vascular Surgery

## 2012-01-06 ENCOUNTER — Other Ambulatory Visit: Payer: Self-pay

## 2012-01-06 ENCOUNTER — Ambulatory Visit (HOSPITAL_COMMUNITY)
Admission: RE | Admit: 2012-01-06 | Discharge: 2012-01-06 | Disposition: A | Discharge status: Home / Self Care | Payer: Medicare Other | Source: Ambulatory Visit | Attending: Anesthesiology | Admitting: Anesthesiology

## 2012-01-06 ENCOUNTER — Ambulatory Visit (INDEPENDENT_AMBULATORY_CARE_PROVIDER_SITE_OTHER): Payer: Medicare Other | Admitting: Vascular Surgery

## 2012-01-06 ENCOUNTER — Encounter (HOSPITAL_COMMUNITY)
Admission: RE | Admit: 2012-01-06 | Discharge: 2012-01-06 | Disposition: A | Discharge status: Home / Self Care | Payer: Medicare Other | Source: Ambulatory Visit | Attending: Vascular Surgery | Admitting: Vascular Surgery

## 2012-01-06 ENCOUNTER — Encounter: Payer: Self-pay | Admitting: Vascular Surgery

## 2012-01-06 ENCOUNTER — Encounter (HOSPITAL_COMMUNITY): Payer: Self-pay

## 2012-01-06 ENCOUNTER — Encounter (HOSPITAL_COMMUNITY): Payer: Self-pay | Admitting: Pharmacy Technician

## 2012-01-06 VITALS — BP 124/64 | HR 89 | Resp 12 | Ht 63.0 in | Wt 122.0 lb

## 2012-01-06 DIAGNOSIS — Z01812 Encounter for preprocedural laboratory examination: Secondary | ICD-10-CM | POA: Insufficient documentation

## 2012-01-06 DIAGNOSIS — I6529 Occlusion and stenosis of unspecified carotid artery: Secondary | ICD-10-CM

## 2012-01-06 DIAGNOSIS — Z01810 Encounter for preprocedural cardiovascular examination: Secondary | ICD-10-CM | POA: Insufficient documentation

## 2012-01-06 DIAGNOSIS — Z01818 Encounter for other preprocedural examination: Secondary | ICD-10-CM | POA: Insufficient documentation

## 2012-01-06 HISTORY — DX: Unspecified osteoarthritis, unspecified site: M19.90

## 2012-01-06 HISTORY — DX: Anxiety disorder, unspecified: F41.9

## 2012-01-06 HISTORY — DX: Essential (primary) hypertension: I10

## 2012-01-06 HISTORY — DX: Unspecified nephritic syndrome with unspecified morphologic changes: N05.9

## 2012-01-06 HISTORY — DX: Shortness of breath: R06.02

## 2012-01-06 HISTORY — DX: Angina pectoris, unspecified: I20.9

## 2012-01-06 HISTORY — DX: Depression, unspecified: F32.A

## 2012-01-06 HISTORY — DX: Cardiac murmur, unspecified: R01.1

## 2012-01-06 HISTORY — DX: Heart failure, unspecified: I50.9

## 2012-01-06 HISTORY — DX: Major depressive disorder, single episode, unspecified: F32.9

## 2012-01-06 LAB — URINALYSIS, ROUTINE W REFLEX MICROSCOPIC
Bilirubin Urine: NEGATIVE
Ketones, ur: NEGATIVE mg/dL
Leukocytes, UA: NEGATIVE
Nitrite: NEGATIVE
Protein, ur: NEGATIVE mg/dL
Urobilinogen, UA: 0.2 mg/dL (ref 0.0–1.0)
pH: 6 (ref 5.0–8.0)

## 2012-01-06 LAB — COMPREHENSIVE METABOLIC PANEL
ALT: 9 U/L (ref 0–35)
AST: 15 U/L (ref 0–37)
Albumin: 3.9 g/dL (ref 3.5–5.2)
CO2: 27 mEq/L (ref 19–32)
Chloride: 105 mEq/L (ref 96–112)
GFR calc non Af Amer: 31 mL/min — ABNORMAL LOW (ref >90)
Sodium: 141 mEq/L (ref 135–145)
Total Bilirubin: 0.2 mg/dL — ABNORMAL LOW (ref 0.3–1.2)

## 2012-01-06 LAB — DIFFERENTIAL
Basophils Absolute: 0.1 10*3/uL (ref 0.0–0.1)
Basophils Relative: 1 % (ref 0–1)
Eosinophils Relative: 1 % (ref 0–5)
Lymphocytes Relative: 35 % (ref 12–46)
Monocytes Absolute: 0.5 10*3/uL (ref 0.1–1.0)
Monocytes Relative: 6 % (ref 3–12)

## 2012-01-06 LAB — CBC
Platelets: 234 10*3/uL (ref 150–400)
RBC: 4.53 MIL/uL (ref 3.87–5.11)
WBC: 7.3 10*3/uL (ref 4.0–10.5)

## 2012-01-06 LAB — APTT: aPTT: 28 seconds (ref 24–37)

## 2012-01-06 LAB — SURGICAL PCR SCREEN: Staphylococcus aureus: NEGATIVE

## 2012-01-06 NOTE — Pre-Procedure Instructions (Signed)
20 AVIYANA SONNTAG  01/06/2012   Your procedure is scheduled on:  01/08/2012  Report to [facility] at 0530 AM.  Call this number if you have problems the morning of surgery: [phone]   Remember:   Do not eat food:After Midnight.  May have clear liquids: up to 4 Hours before arrival (1:30 A).  Clear liquids include soda, tea, black coffee, apple or grape juice, broth.  Take these medicines the morning of surgery with A SIP OF WATER: Klonopin, Neurontin, Imdur, Seroquel   Do not wear jewelry, make-up or nail polish.  Do not wear lotions, powders, or perfumes. You may wear deodorant.  Do not shave 48 hours prior to surgery.  Do not bring valuables to the hospital.  Contacts, dentures or bridgework may not be worn into surgery.  Leave suitcase in the car. After surgery it may be brought to your room.  For patients admitted to the hospital, checkout time is 11:00 AM the day of discharge.   Patients discharged the day of surgery will not be allowed to drive home.  Name and phone number of your driver: Being admitted.  Special Instructions: CHG Shower Use Special Wash: 1/2 bottle night before surgery and 1/2 bottle morning of surgery.   Please read over the following fact sheets that you were given: Pain Booklet, Coughing and Deep Breathing, Blood Transfusion Information, MRSA Information and Surgical Site Infection Prevention

## 2012-01-06 NOTE — Progress Notes (Signed)
Subjective:     Patient ID: Caroline [name], female   DOB: [birth year], 61 y.o.   MRN: [id number]  HPI this 61 year old female was referred by Dr.Nishan for severe carotid occlusive disease. Patient had no history of definite carotid stenosis. She did lose half of the right visual field (upper half) which she relates to a fall a few years ago when she developed a small "knot" on her forehead. She has no history of lateralizing weakness, aphasia, amaurosis fugax in the left eye, or stroke. She does have a history of coronary artery disease having had cardiac stents in the past and is currently on Plavix and aspirin. He has been doing well from a cardiac standpoint.  Past Medical History  Diagnosis Date  . GERD (gastroesophageal reflux disease)   . Grave's disease   . Dyslipidemia   . CAD (coronary artery disease)     [facility]  Diagonal instent restenosis treated 10/2005    History  Substance Use Topics  . Smoking status: Current Everyday Smoker  . Smokeless tobacco: Not on file  . Alcohol Use: No    Family History  Problem Relation Age of Onset  . Coronary artery disease      Allergies  Allergen Reactions  . Amoxicillin     REACTION: Reaction not known  . Codeine     REACTION: Reaction not known    Current outpatient prescriptions:aspirin 325 MG tablet, Take 325 mg by mouth daily., Disp: , Rfl: ;  clonazepam (KLONOPIN) 2 MG disintegrating tablet, Take 2 mg by mouth 3 (three) times daily. , Disp: , Rfl: ;  clopidogrel (PLAVIX) 75 MG tablet, Take 1 tablet (75 mg total) by mouth daily., Disp: 30 tablet, Rfl: 3;  hydrOXYzine (ATARAX/VISTARIL) 25 MG tablet, Take 25 mg by mouth as needed., Disp: , Rfl:  isosorbide mononitrate (IMDUR) 30 MG 24 hr tablet, Take 1 tablet (30 mg total) by mouth daily., Disp: 30 tablet, Rfl: 11;  Omega-3 Fatty Acids (FISH OIL) 1000 MG CAPS, Take 1 capsule by mouth 2 (two) times daily.  , Disp: , Rfl: ;  QUEtiapine (SEROQUEL) 400 MG tablet, Take 400 mg by mouth  at bedtime.  , Disp: , Rfl: ;  QUEtiapine Fumarate (SEROQUEL XR) 150 MG TB24, 1 tab po qd, Disp: , Rfl:  gabapentin (NEURONTIN) 300 MG capsule, Take 300 mg by mouth 3 (three) times daily.  , Disp: , Rfl:   BP 124/64  Pulse 89  Resp 12  Ht 5\' 3"  (1.6 m)  Wt 122 lb (55.339 kg)  BMI 21.61 kg/m2  SpO2 99%  Body mass index is 21.61 kg/(m^2).        Review of Systems denies chest pain but does have dyspnea on exertion. Has had previous right upper placement. Only able to ambulate about one half block. Has history of hyperlipidemia and some psychiatric issues. The cardiac intervention by Dr. Camillo Flaming. All other systems negative.    Objective:   Physical Exam blood pressure 124/64 heart rate 80 and respirations 12 Gen.-alert and oriented x3 in no apparent distress HEENT normal for age Lungs no rhonchi or wheezing Cardiovascular regular rhythm no murmurs carotid pulses 3+ palpable no bruit audible on the left. Abdomen soft nontender no palpable masses Musculoskeletal free of  major deformities Skin clear -no rashes Neurologic normal grossly has loss of her upper visual field otherwise normal  Lower extremities 3+ femoral and dorsalis pedis pulses palpable bilaterally with no edema  Today I reviewed her carotid duplex  exam performed on 12/24/2011 at [facility]. The patient has right ICA occlusion and probable 95-100% left ICA stenosis.    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 Eden Emms and proceed with left carotid endarterectomy on Thursday of this week if agreeable with him. Will discuss whether Plavix needs to be continued preoperatively with him. Have discussed potential risks of surgery and also bleeding and stroke with the patient and her cousin and they are accepting of  this.

## 2012-01-07 ENCOUNTER — Telehealth: Payer: Self-pay | Admitting: Cardiovascular Disease

## 2012-01-07 MED ORDER — VANCOMYCIN HCL IN DEXTROSE 1-5 GM/200ML-% IV SOLN
1000.0000 mg | Freq: Once | INTRAVENOUS | Status: AC
  Administered 2012-01-08: 1000 mg via INTRAVENOUS
  Filled 2012-01-07: qty 200

## 2012-01-07 MED ORDER — SODIUM CHLORIDE 0.9 % IV SOLN: INTRAVENOUS | Status: DC

## 2012-01-07 NOTE — Telephone Encounter (Signed)
Pt went to surgeon, dr Sharyon Cable, yesterday at [facility], didn't like how he  talked to her and because of his attitude towards her doesn't want him to do her surgery, she asked a specific question twice and he rolled his eyes, made a face and turned his head away from her, wants dr Eden Emms to know and what his opinion is, and who he would recommend her see?

## 2012-01-07 NOTE — Consult Note (Signed)
Anesthesia:  Patient is a 61 year old female scheduled for a left CEA on 01/08/12.  History includes CAD/MI s/p redo diagonal stent 04/03/09, dyslipidemia, GERD, Grave's Disease, smoking, non-compliance, anxiety, CHF, HTN, depression, Bright's disease (CKD), right breast CA s/p lumpectomy.  Her primary Cardiologist is Dr. Eden Emms.  He actually referred Ms. Mcgrory to [facility] for surgical intervention.  He last saw her on 12/09/11 and was not having any cardiac ischemic symptoms at that time and continued medical treatment was recommended.  EKG then showed ST @ 105 bpm, RAE, LVH with strain, inferior lateral T wave abnormality with later T wave inversions felt more prominent since 2011.  Echo on 04/01/09 showed: 1. Left ventricle: The cavity size was normal. Wall thickness was normal. The estimated ejection fraction was 40%. The mid to apical anterior and anteroseptal walls appear moderately hypokinetic, worse toward the apex. Features are consistent with a pseudonormal left ventricular filling pattern, with concomitant abnormal relaxation and increased filling pressure (grade 2 diastolic dysfunction). 2. Aortic valve: Mild regurgitation. 3. Mitral valve: Mildly calcified annulus. Mild regurgitation. 4. Left atrium: The atrium was mildly dilated. Impressions:The patient was on 10 mcg/kg/min dopamine during this study. The LV was normal in size with mild to moderate hypokinesis, EF 40%. The mid to apical anterior and anteroseptal walls appear moderately hypokinetic, worse towards the apex. There is mild mitral and aortic regurgitation.  Stress test on 03/05/09 showed (see Notes tab): Overall Impression: Fixed severe perfusion defect in the mid to apical anterior wall and the true apex. This area is severely hypokinetic to akinetic on gated images. Moderately decreased LV systolic function. EF 38%. Overall Impression Comments: No significant ischemia.   She later had a cardiac cath on 04/03/09 (Dr. Clifton James) showing (see  Notes tab): 1. Single-vessel coronary artery disease.  2. Moderate left ventricular systolic dysfunction.  3. Successful percutaneous coronary intervention with cutting balloon angioplasty only of the in-stent restenosis in the diagonal branch.   CXR shows no active disease.  Labs noted.  BUN/Cr 26/1.74.  Her last Cr that I have available is 1.38 from August 2011 (range from 1.22-1.81).  She has known CKD.  Cr remains less than 2.0 and is within her previous range in 2011.  Will defer decision for any additional pre-operative labs to Dr. Hart Rochester.  He can follow her renal function post-operatively.  Glucose was 64.  She has no history of diabetes.  She is a 0730 case tomorrow.  Glucose can be monitored perioperatively, or sooner if patient reports any hypoglycemia symptoms.  Plan to proceed if no significant change in her status.

## 2012-01-07 NOTE — Telephone Encounter (Signed)
Will forward to Dr. Nishan for review and recommendations. 

## 2012-01-07 NOTE — Telephone Encounter (Signed)
I called her and she is ok for surgery in am

## 2012-01-08 ENCOUNTER — Encounter (HOSPITAL_COMMUNITY): Payer: Self-pay | Admitting: Vascular Surgery

## 2012-01-08 ENCOUNTER — Encounter (HOSPITAL_COMMUNITY)
Admission: RE | Disposition: A | Discharge status: Home / Self Care | Payer: Self-pay | Source: Ambulatory Visit | Attending: Vascular Surgery

## 2012-01-08 ENCOUNTER — Inpatient Hospital Stay (HOSPITAL_COMMUNITY)
Admission: RE | Admit: 2012-01-08 | Discharge: 2012-01-09 | DRG: 039 | Disposition: A | Discharge status: Home / Self Care | Payer: Medicare Other | Source: Ambulatory Visit | Attending: Vascular Surgery | Admitting: Vascular Surgery

## 2012-01-08 ENCOUNTER — Ambulatory Visit (HOSPITAL_COMMUNITY): Payer: Medicare Other | Admitting: Vascular Surgery

## 2012-01-08 DIAGNOSIS — I6529 Occlusion and stenosis of unspecified carotid artery: Secondary | ICD-10-CM

## 2012-01-08 DIAGNOSIS — Z79899 Other long term (current) drug therapy: Secondary | ICD-10-CM

## 2012-01-08 DIAGNOSIS — F341 Dysthymic disorder: Secondary | ICD-10-CM | POA: Diagnosis present

## 2012-01-08 DIAGNOSIS — Z9861 Coronary angioplasty status: Secondary | ICD-10-CM

## 2012-01-08 DIAGNOSIS — I252 Old myocardial infarction: Secondary | ICD-10-CM

## 2012-01-08 DIAGNOSIS — I10 Essential (primary) hypertension: Secondary | ICD-10-CM | POA: Diagnosis present

## 2012-01-08 DIAGNOSIS — E785 Hyperlipidemia, unspecified: Secondary | ICD-10-CM | POA: Diagnosis present

## 2012-01-08 DIAGNOSIS — Z7902 Long term (current) use of antithrombotics/antiplatelets: Secondary | ICD-10-CM

## 2012-01-08 DIAGNOSIS — I658 Occlusion and stenosis of other precerebral arteries: Secondary | ICD-10-CM | POA: Diagnosis present

## 2012-01-08 DIAGNOSIS — Z7982 Long term (current) use of aspirin: Secondary | ICD-10-CM

## 2012-01-08 DIAGNOSIS — Z853 Personal history of malignant neoplasm of breast: Secondary | ICD-10-CM

## 2012-01-08 DIAGNOSIS — I2510 Atherosclerotic heart disease of native coronary artery without angina pectoris: Secondary | ICD-10-CM | POA: Diagnosis present

## 2012-01-08 HISTORY — PX: ENDARTERECTOMY: SHX5162

## 2012-01-08 HISTORY — PX: CAROTID ENDARTERECTOMY: SUR193

## 2012-01-08 LAB — GLUCOSE, CAPILLARY: Glucose-Capillary: 73 mg/dL (ref 70–99)

## 2012-01-08 SURGERY — ENDARTERECTOMY, CAROTID
Anesthesia: General | Site: Neck | Laterality: Left | Wound class: Clean

## 2012-01-08 MED ORDER — HETASTARCH-ELECTROLYTES 6 % IV SOLN
INTRAVENOUS | Status: DC | PRN
  Administered 2012-01-08: 09:00:00 via INTRAVENOUS

## 2012-01-08 MED ORDER — SODIUM CHLORIDE 0.9 % IV SOLN
INTRAVENOUS | Status: DC
  Administered 2012-01-08 – 2012-01-09 (×4): via INTRAVENOUS

## 2012-01-08 MED ORDER — MAGNESIUM SULFATE 40 MG/ML IJ SOLN
2.0000 g | Freq: Once | INTRAMUSCULAR | Status: AC | PRN
  Filled 2012-01-08: qty 50

## 2012-01-08 MED ORDER — LIDOCAINE HCL (CARDIAC) 20 MG/ML IV SOLN
INTRAVENOUS | Status: DC | PRN
  Administered 2012-01-08: 100 mg via INTRAVENOUS

## 2012-01-08 MED ORDER — PROTAMINE SULFATE 10 MG/ML IV SOLN
INTRAVENOUS | Status: DC | PRN
  Administered 2012-01-08: 10 mg via INTRAVENOUS
  Administered 2012-01-08: 20 mg via INTRAVENOUS
  Administered 2012-01-08 (×2): 10 mg via INTRAVENOUS

## 2012-01-08 MED ORDER — TRAMADOL HCL 50 MG PO TABS: 50.0000 mg | ORAL_TABLET | Freq: Four times a day (QID) | ORAL | Status: DC | PRN

## 2012-01-08 MED ORDER — FAMOTIDINE IN NACL 20-0.9 MG/50ML-% IV SOLN
20.0000 mg | Freq: Two times a day (BID) | INTRAVENOUS | Status: DC
  Administered 2012-01-08 – 2012-01-09 (×2): 20 mg via INTRAVENOUS
  Filled 2012-01-08 (×3): qty 50

## 2012-01-08 MED ORDER — VECURONIUM BROMIDE 10 MG IV SOLR
INTRAVENOUS | Status: DC | PRN
  Administered 2012-01-08: 3 mg via INTRAVENOUS

## 2012-01-08 MED ORDER — NEOSTIGMINE METHYLSULFATE 1 MG/ML IJ SOLN
INTRAMUSCULAR | Status: DC | PRN
  Administered 2012-01-08: 3 mg via INTRAVENOUS

## 2012-01-08 MED ORDER — VANCOMYCIN HCL IN DEXTROSE 1-5 GM/200ML-% IV SOLN
1000.0000 mg | Freq: Two times a day (BID) | INTRAVENOUS | Status: DC
  Filled 2012-01-08 (×2): qty 200

## 2012-01-08 MED ORDER — SODIUM CHLORIDE 0.9 % IR SOLN
Status: DC | PRN
  Administered 2012-01-08: 1000 mL

## 2012-01-08 MED ORDER — FENTANYL CITRATE 0.05 MG/ML IJ SOLN
INTRAMUSCULAR | Status: DC | PRN
  Administered 2012-01-08: 50 ug via INTRAVENOUS
  Administered 2012-01-08: 150 ug via INTRAVENOUS
  Administered 2012-01-08 (×2): 50 ug via INTRAVENOUS

## 2012-01-08 MED ORDER — HEPARIN SODIUM (PORCINE) 1000 UNIT/ML IJ SOLN
INTRAMUSCULAR | Status: DC | PRN
  Administered 2012-01-08: 6000 [IU] via INTRAVENOUS

## 2012-01-08 MED ORDER — POTASSIUM CHLORIDE CRYS ER 20 MEQ PO TBCR: 20.0000 meq | EXTENDED_RELEASE_TABLET | Freq: Once | ORAL | Status: AC | PRN

## 2012-01-08 MED ORDER — SODIUM CHLORIDE 0.9 % IV SOLN
500.0000 mL | Freq: Once | INTRAVENOUS | Status: AC | PRN
  Administered 2012-01-08 (×2): 500 mL via INTRAVENOUS

## 2012-01-08 MED ORDER — QUETIAPINE FUMARATE 400 MG PO TABS
400.0000 mg | ORAL_TABLET | Freq: Every day | ORAL | Status: DC
  Administered 2012-01-08: 400 mg via ORAL
  Filled 2012-01-08 (×2): qty 1

## 2012-01-08 MED ORDER — SENNOSIDES-DOCUSATE SODIUM 8.6-50 MG PO TABS
1.0000 | ORAL_TABLET | Freq: Every evening | ORAL | Status: DC | PRN
  Filled 2012-01-08: qty 1

## 2012-01-08 MED ORDER — ESMOLOL HCL 10 MG/ML IV SOLN
INTRAVENOUS | Status: DC | PRN
  Administered 2012-01-08: 10 mg via INTRAVENOUS

## 2012-01-08 MED ORDER — HYDROMORPHONE HCL PF 1 MG/ML IJ SOLN: 0.2500 mg | INTRAMUSCULAR | Status: DC | PRN

## 2012-01-08 MED ORDER — MORPHINE SULFATE 2 MG/ML IJ SOLN
1.0000 mg | INTRAMUSCULAR | Status: DC | PRN
  Administered 2012-01-08: 1 mg via INTRAVENOUS
  Administered 2012-01-08 – 2012-01-09 (×3): 2 mg via INTRAVENOUS
  Filled 2012-01-08 (×4): qty 1

## 2012-01-08 MED ORDER — BISACODYL 5 MG PO TBEC: 5.0000 mg | DELAYED_RELEASE_TABLET | Freq: Every day | ORAL | Status: DC | PRN

## 2012-01-08 MED ORDER — DOPAMINE-DEXTROSE 3.2-5 MG/ML-% IV SOLN: 3.0000 ug/kg/min | INTRAVENOUS | Status: DC

## 2012-01-08 MED ORDER — PHENOL 1.4 % MT LIQD: 1.0000 | OROMUCOSAL | Status: DC | PRN

## 2012-01-08 MED ORDER — HYDROXYZINE HCL 25 MG PO TABS
25.0000 mg | ORAL_TABLET | Freq: Three times a day (TID) | ORAL | Status: DC | PRN
  Filled 2012-01-08: qty 1

## 2012-01-08 MED ORDER — ROCURONIUM BROMIDE 100 MG/10ML IV SOLN
INTRAVENOUS | Status: DC | PRN
  Administered 2012-01-08: 50 mg via INTRAVENOUS

## 2012-01-08 MED ORDER — GLYCOPYRROLATE 0.2 MG/ML IJ SOLN
INTRAMUSCULAR | Status: DC | PRN
  Administered 2012-01-08: .5 mg via INTRAVENOUS

## 2012-01-08 MED ORDER — CLONAZEPAM 1 MG PO TBDP: 2.0000 mg | ORAL_TABLET | Freq: Three times a day (TID) | ORAL | Status: DC

## 2012-01-08 MED ORDER — ASPIRIN 325 MG PO TABS
325.0000 mg | ORAL_TABLET | Freq: Every day | ORAL | Status: DC
  Administered 2012-01-09: 325 mg via ORAL
  Filled 2012-01-08: qty 1

## 2012-01-08 MED ORDER — ACETAMINOPHEN 650 MG RE SUPP: 325.0000 mg | RECTAL | Status: DC | PRN

## 2012-01-08 MED ORDER — ISOSORBIDE MONONITRATE ER 30 MG PO TB24
30.0000 mg | ORAL_TABLET | Freq: Every day | ORAL | Status: DC
  Administered 2012-01-08 – 2012-01-09 (×2): 30 mg via ORAL
  Filled 2012-01-08 (×2): qty 1

## 2012-01-08 MED ORDER — TRAMADOL HCL 50 MG PO TABS: 50.0000 mg | ORAL_TABLET | Freq: Four times a day (QID) | ORAL | Status: DC

## 2012-01-08 MED ORDER — SODIUM CHLORIDE 0.9 % IV SOLN
10.0000 mg | INTRAVENOUS | Status: DC | PRN
  Administered 2012-01-08: 25 ug/min via INTRAVENOUS

## 2012-01-08 MED ORDER — ONDANSETRON HCL 4 MG/2ML IJ SOLN: 4.0000 mg | Freq: Once | INTRAMUSCULAR | Status: DC | PRN

## 2012-01-08 MED ORDER — MORPHINE SULFATE 2 MG/ML IJ SOLN: 0.0500 mg/kg | INTRAMUSCULAR | Status: DC | PRN

## 2012-01-08 MED ORDER — ONDANSETRON HCL 4 MG/2ML IJ SOLN
INTRAMUSCULAR | Status: DC | PRN
  Administered 2012-01-08: 4 mg via INTRAVENOUS

## 2012-01-08 MED ORDER — DEXTROSE 50 % IV SOLN
25.0000 mL | Freq: Once | INTRAVENOUS | Status: AC
  Administered 2012-01-08: 25 mL via INTRAVENOUS

## 2012-01-08 MED ORDER — GUAIFENESIN-DM 100-10 MG/5ML PO SYRP: 15.0000 mL | ORAL_SOLUTION | ORAL | Status: DC | PRN

## 2012-01-08 MED ORDER — HYDRALAZINE HCL 20 MG/ML IJ SOLN: 10.0000 mg | INTRAMUSCULAR | Status: DC | PRN

## 2012-01-08 MED ORDER — CLONAZEPAM 0.5 MG PO TABS
2.0000 mg | ORAL_TABLET | Freq: Three times a day (TID) | ORAL | Status: DC
  Administered 2012-01-08 – 2012-01-09 (×3): 2 mg via ORAL
  Filled 2012-01-08 (×3): qty 4

## 2012-01-08 MED ORDER — VANCOMYCIN HCL IN DEXTROSE 1-5 GM/200ML-% IV SOLN
1000.0000 mg | Freq: Once | INTRAVENOUS | Status: AC
  Administered 2012-01-09: 1000 mg via INTRAVENOUS
  Filled 2012-01-08: qty 200

## 2012-01-08 MED ORDER — QUETIAPINE FUMARATE ER 50 MG PO TB24
150.0000 mg | ORAL_TABLET | Freq: Every day | ORAL | Status: DC
  Administered 2012-01-09: 150 mg via ORAL
  Filled 2012-01-08 (×2): qty 3

## 2012-01-08 MED ORDER — LABETALOL HCL 5 MG/ML IV SOLN: 10.0000 mg | INTRAVENOUS | Status: DC | PRN

## 2012-01-08 MED ORDER — ONDANSETRON HCL 4 MG/2ML IJ SOLN: 4.0000 mg | Freq: Four times a day (QID) | INTRAMUSCULAR | Status: DC | PRN

## 2012-01-08 MED ORDER — ACETAMINOPHEN 325 MG PO TABS: 325.0000 mg | ORAL_TABLET | ORAL | Status: DC | PRN

## 2012-01-08 MED ORDER — PROPOFOL 10 MG/ML IV EMUL
INTRAVENOUS | Status: DC | PRN
  Administered 2012-01-08: 20 mg via INTRAVENOUS
  Administered 2012-01-08: 50 mg via INTRAVENOUS

## 2012-01-08 MED ORDER — DOCUSATE SODIUM 100 MG PO CAPS
100.0000 mg | ORAL_CAPSULE | Freq: Every day | ORAL | Status: DC
  Administered 2012-01-09: 100 mg via ORAL
  Filled 2012-01-08: qty 1

## 2012-01-08 MED ORDER — SODIUM CHLORIDE 0.9 % IV SOLN
INTRAVENOUS | Status: DC | PRN
  Administered 2012-01-08: 10:00:00 via INTRAVENOUS

## 2012-01-08 MED ORDER — LACTATED RINGERS IV SOLN
INTRAVENOUS | Status: DC | PRN
  Administered 2012-01-08: 07:00:00 via INTRAVENOUS

## 2012-01-08 MED ORDER — METOPROLOL TARTRATE 1 MG/ML IV SOLN: 2.0000 mg | INTRAVENOUS | Status: DC | PRN

## 2012-01-08 MED ORDER — OXYCODONE HCL 5 MG PO TABS
5.0000 mg | ORAL_TABLET | Freq: Four times a day (QID) | ORAL | Status: DC | PRN
  Filled 2012-01-08: qty 2

## 2012-01-08 MED ORDER — HEMOSTATIC AGENTS (NO CHARGE) OPTIME
TOPICAL | Status: DC | PRN
  Administered 2012-01-08: 1 via TOPICAL

## 2012-01-08 MED ORDER — SODIUM CHLORIDE 0.9 % IR SOLN
Status: DC | PRN
  Administered 2012-01-08: 09:00:00

## 2012-01-08 SURGICAL SUPPLY — 46 items
CANISTER SUCTION 2500CC (MISCELLANEOUS) ×2 IMPLANT
CATH ROBINSON RED A/P 18FR (CATHETERS) ×2 IMPLANT
CATH SUCT 10FR WHISTLE TIP (CATHETERS) ×2 IMPLANT
CLIP TI MEDIUM 24 (CLIP) ×2 IMPLANT
CLIP TI WIDE RED SMALL 24 (CLIP) ×2 IMPLANT
CLOTH BEACON ORANGE TIMEOUT ST (SAFETY) ×2 IMPLANT
COVER SURGICAL LIGHT HANDLE (MISCELLANEOUS) ×4 IMPLANT
CRADLE DONUT ADULT HEAD (MISCELLANEOUS) ×2 IMPLANT
DECANTER SPIKE VIAL GLASS SM (MISCELLANEOUS) IMPLANT
DRAIN HEMOVAC 1/8 X 5 (WOUND CARE) ×1 IMPLANT
DRAPE WARM FLUID 44X44 (DRAPE) ×2 IMPLANT
DRSG COVADERM 4X8 (GAUZE/BANDAGES/DRESSINGS) ×1 IMPLANT
ELECT REM PT RETURN 9FT ADLT (ELECTROSURGICAL) ×2
ELECTRODE REM PT RTRN 9FT ADLT (ELECTROSURGICAL) ×1 IMPLANT
EVACUATOR SILICONE 100CC (DRAIN) ×1 IMPLANT
GLOVE BIOGEL PI IND STRL 7.0 (GLOVE) IMPLANT
GLOVE BIOGEL PI IND STRL 7.5 (GLOVE) IMPLANT
GLOVE BIOGEL PI INDICATOR 7.0 (GLOVE) ×1
GLOVE BIOGEL PI INDICATOR 7.5 (GLOVE) ×2
GLOVE ECLIPSE 7.0 STRL STRAW (GLOVE) ×1 IMPLANT
GLOVE SS BIOGEL STRL SZ 7 (GLOVE) ×1 IMPLANT
GLOVE SUPERSENSE BIOGEL SZ 7 (GLOVE) ×2
GLOVE SURG SS PI 7.5 STRL IVOR (GLOVE) ×2 IMPLANT
GOWN EXTRA PROTECTION XL (GOWNS) ×2 IMPLANT
GOWN STRL NON-REIN LRG LVL3 (GOWN DISPOSABLE) ×5 IMPLANT
INSERT FOGARTY SM (MISCELLANEOUS) ×2 IMPLANT
KIT BASIN OR (CUSTOM PROCEDURE TRAY) ×2 IMPLANT
KIT ROOM TURNOVER OR (KITS) ×2 IMPLANT
NEEDLE 22X1 1/2 (OR ONLY) (NEEDLE) IMPLANT
NS IRRIG 1000ML POUR BTL (IV SOLUTION) ×4 IMPLANT
PACK CAROTID (CUSTOM PROCEDURE TRAY) ×2 IMPLANT
PAD ARMBOARD 7.5X6 YLW CONV (MISCELLANEOUS) ×4 IMPLANT
PATCH HEMASHIELD 8X75 (Vascular Products) ×1 IMPLANT
SHUNT CAROTID BYPASS 12FRX15.5 (VASCULAR PRODUCTS) IMPLANT
SPECIMEN JAR SMALL (MISCELLANEOUS) ×2 IMPLANT
SPONGE GAUZE 2X2 8PLY STRL LF (GAUZE/BANDAGES/DRESSINGS) ×1 IMPLANT
SUT PROLENE 6 0 CC (SUTURE) ×3 IMPLANT
SUT SILK 2 0 FS (SUTURE) ×2 IMPLANT
SUT VIC AB 2-0 CT1 27 (SUTURE) ×2
SUT VIC AB 2-0 CT1 TAPERPNT 27 (SUTURE) ×1 IMPLANT
SUT VIC AB 3-0 X1 27 (SUTURE) ×2 IMPLANT
SYR CONTROL 10ML LL (SYRINGE) IMPLANT
TAPE PAPER 3X10 WHT MICROPORE (GAUZE/BANDAGES/DRESSINGS) ×1 IMPLANT
TOWEL OR 17X24 6PK STRL BLUE (TOWEL DISPOSABLE) ×2 IMPLANT
TOWEL OR 17X26 10 PK STRL BLUE (TOWEL DISPOSABLE) ×2 IMPLANT
WATER STERILE IRR 1000ML POUR (IV SOLUTION) ×2 IMPLANT

## 2012-01-08 NOTE — Anesthesia Preprocedure Evaluation (Addendum)
Anesthesia Evaluation  Patient identified by MRN, date of birth, ID band Patient awake    Reviewed: Allergy & Precautions, H&P , NPO status , Patient's Chart, lab work & pertinent test results  Airway  TM Distance: >3 FB Neck ROM: Full    Dental  (+) Teeth Intact and Dental Advisory Given   Pulmonary shortness of breath,  breath sounds clear to auscultation        Cardiovascular hypertension, Pt. on medications + angina at rest and with exertion + CAD and + Past MI Rhythm:Regular Rate:Normal     Neuro/Psych PSYCHIATRIC DISORDERS Anxiety Depression    GI/Hepatic GERD-  ,  Endo/Other  Hyperthyroidism   Renal/GU      Musculoskeletal   Abdominal   Peds  Hematology   Anesthesia Other Findings   Reproductive/Obstetrics                          Anesthesia Physical Anesthesia Plan  ASA: III  Anesthesia Plan: General   Post-op Pain Management:    Induction: Intravenous  Airway Management Planned: Oral ETT  Additional Equipment: Arterial line  Intra-op Plan:   Post-operative Plan: Extubation in OR  Informed Consent: I have reviewed the patients History and Physical, chart, labs and discussed the procedure including the risks, benefits and alternatives for the proposed anesthesia with the patient or authorized representative who has indicated his/her understanding and acceptance.   Dental advisory given  Plan Discussed with: Anesthesiologist, Surgeon and CRNA  Anesthesia Plan Comments:        Anesthesia Quick Evaluation

## 2012-01-08 NOTE — Transfer of Care (Signed)
Immediate Anesthesia Transfer of Care Note  Patient: Caroline [name]  Procedure(s) Performed: Procedure(s) (LRB): ENDARTERECTOMY CAROTID (Left)  Patient Location: PACU  Anesthesia Type: General  Level of Consciousness: awake and oriented, cooperative  Airway & Oxygen Therapy: Patient Spontanous Breathing  Post-op Assessment: Report given to PACU RN, Post -op Vital signs reviewed and stable and Patient moving all extremities X 4, tongue midline  Post vital signs: Reviewed  Complications: No apparent anesthesia complications

## 2012-01-08 NOTE — Addendum Note (Signed)
Addendum  created 01/08/12 1041 by Michalla Ringer Todd Lenya Sterne, CRNA   Modules edited:Anesthesia Medication Administration    

## 2012-01-08 NOTE — Plan of Care (Signed)
Problem: Consults Goal: Diagnosis CEA/CES/AAA Stent Outcome: Completed/Met Date Met:  01/08/12 Carotid Endarterectomy (CEA)     Problem: Phase I Progression Outcomes Goal: Voiding after catheter removal Outcome: Not Applicable Date Met:  01/08/12 Patient already voiding

## 2012-01-08 NOTE — Anesthesia Postprocedure Evaluation (Signed)
  Anesthesia Post-op Note  Patient: Caroline [name]  Procedure(s) Performed: Procedure(s) (LRB): ENDARTERECTOMY CAROTID (Left)  Patient Location: PACU  Anesthesia Type: General  Level of Consciousness: awake and sedated  Airway and Oxygen Therapy: Patient Spontanous Breathing and Patient connected to nasal cannula oxygen  Post-op Pain: none  Post-op Assessment: Post-op Vital signs reviewed, Patient's Cardiovascular Status Stable, Respiratory Function Stable, Patent Airway, No signs of Nausea or vomiting and Pain level controlled  Post-op Vital Signs: Reviewed and stable  Complications: No apparent anesthesia complications

## 2012-01-08 NOTE — Op Note (Signed)
OPERATIVE REPORT  Date of Surgery: 01/08/2012  Surgeon: Josephina Gip, MD  Assistant: Lianne Cure PA  Pre-op Diagnosis: Left severe ICA stenosis; asymptomatic, and right ICA occlusion  Post-op Diagnosis: Same Procedure: Procedure(s): ENDARTERECTOMY CAROTID left with Dacron patch angioplasty Anesthesia: General  EBL: 100 cc  Complications: None  Procedure Details: The patient was taken to the operating room and placed in the supine position. Following induction of satisfactory general endotracheal anesthesia the left neck was prepped and draped in a routine sterile manner. Incision was made on the anterior border of the sternocleidomastoid muscle and carried down through the subcutaneous tissue and platysma using the Bovie. Care was taken not to injure the hypoglossal nerve.. The common internal and external carotid arteries were dissected free. There was a calcified atherosclerotic plaque at the carotid bifurcation extending up the internal carotid artery. A #10 shunt was then prepared and the patient was heparinized. The carotid vessels were occluded with vascular clamps. A longitudinal opening was made in the common carotid with a 15 blade extended up the internal carotid with the Potts scissors to a point distal to the disease. The plaque was approximately 90 % stenotic in severity. The distal vessel appeared normal. Shunt was inserted without difficulty reestablishing flow in about 2 minutes. A standard endarterectomy was performed with an eversion endarterectomy of the external carotid. The plaque feathered off  the distal internal carotid artery nicely not requiring any tacking sutures. The lumen was thoroughly irrigated with heparinized saline and loose debris all carefully removed. The arterotomy was then closed with a patch using continuous 6-0 Prolene. Prior to completion of the  Closure the  shunt was removed after approximately 45 minutes of shunt time. Flow was then reestablished up  the external branch initially followed by the internal branch. Protamine was given to her reverse the heparin.Following adequate hemostasis the wound was irrigated with saline and closed in layers with Vicryl ain a subcuticular fashion. Sterile dressing was applied and the patient taken to the recovery room in stable condition. The patient did have some persistent oozing through the patch initially which did resolve with a Jackson-Pratt drain was left in the wound and brought out through an inferiorly based stab wound secured with a nylon suture.  Josephina Gip, MD 01/08/2012 10:14 AM

## 2012-01-08 NOTE — Preoperative (Signed)
Beta Blockers   Reason not to administer Beta Blockers:Not Applicable, not currently taking beta blocker 

## 2012-01-08 NOTE — OR Nursing (Signed)
Patient has strong, equal strength in bilateral arms and legs; tongue midline; able to follow commands post-op.

## 2012-01-08 NOTE — Addendum Note (Signed)
Addendum  created 01/08/12 1041 by Hessie Dibble, CRNA   Modules edited:Anesthesia Medication Administration

## 2012-01-08 NOTE — Interval H&P Note (Signed)
History and Physical Interval Note:  01/08/2012 7:32 AM  [name]  has presented today for surgery, with the diagnosis of Left severe ICA stenosis; asymptomatic, and right ICA occlusion  The various methods of treatment have been discussed with the patient and family. After consideration of risks, benefits and other options for treatment, the patient has consented to  Procedure(s) (LRB): ENDARTERECTOMY CAROTID (Left) as a surgical intervention .  The patients' history has been reviewed, patient examined, no change in status, stable for surgery.  I have reviewed the patients' chart and labs.  Questions were answered to the patient's satisfaction.     Caroline [name]

## 2012-01-08 NOTE — Progress Notes (Signed)
01/08/2012 1440 Received report from Phillip,RN. Patient arrived from PACU, VSS, complaining of surgical neck pain. Surgical site is covered with a clean dry and intact dressing, JP x1 to site is WNL. Discussed safety with patient and bed is low with call bell within reach. Will continue to monitor. Celesta Gentile

## 2012-01-08 NOTE — H&P (View-Only) (Signed)
Subjective:     Patient ID: Caroline [name], female   DOB: 06/01/1951, 60 y.o.   MRN: [id number]  HPI this 60-year-old female was referred by Dr.Nishan for severe carotid occlusive disease. Patient had no history of definite carotid stenosis. She did lose half of the right visual field (upper half) which she relates to a fall a few years ago when she developed a small "knot" on her forehead. She has no history of lateralizing weakness, aphasia, amaurosis fugax in the left eye, or stroke. She does have a history of coronary artery disease having had cardiac stents in the past and is currently on Plavix and aspirin. He has been doing well from a cardiac standpoint.  Past Medical History  Diagnosis Date  . GERD (gastroesophageal reflux disease)   . Grave's disease   . Dyslipidemia   . CAD (coronary artery disease)     [facility]  Diagonal instent restenosis treated 10/2005    History  Substance Use Topics  . Smoking status: Current Everyday Smoker  . Smokeless tobacco: Not on file  . Alcohol Use: No    Family History  Problem Relation Age of Onset  . Coronary artery disease      Allergies  Allergen Reactions  . Amoxicillin     REACTION: Reaction not known  . Codeine     REACTION: Reaction not known    Current outpatient prescriptions:aspirin 325 MG tablet, Take 325 mg by mouth daily., Disp: , Rfl: ;  clonazepam (KLONOPIN) 2 MG disintegrating tablet, Take 2 mg by mouth 3 (three) times daily. , Disp: , Rfl: ;  clopidogrel (PLAVIX) 75 MG tablet, Take 1 tablet (75 mg total) by mouth daily., Disp: 30 tablet, Rfl: 3;  hydrOXYzine (ATARAX/VISTARIL) 25 MG tablet, Take 25 mg by mouth as needed., Disp: , Rfl:  isosorbide mononitrate (IMDUR) 30 MG 24 hr tablet, Take 1 tablet (30 mg total) by mouth daily., Disp: 30 tablet, Rfl: 11;  Omega-3 Fatty Acids (FISH OIL) 1000 MG CAPS, Take 1 capsule by mouth 2 (two) times daily.  , Disp: , Rfl: ;  QUEtiapine (SEROQUEL) 400 MG tablet, Take 400 mg by mouth  at bedtime.  , Disp: , Rfl: ;  QUEtiapine Fumarate (SEROQUEL XR) 150 MG TB24, 1 tab po qd, Disp: , Rfl:  gabapentin (NEURONTIN) 300 MG capsule, Take 300 mg by mouth 3 (three) times daily.  , Disp: , Rfl:   BP 124/64  Pulse 89  Resp 12  Ht 5' 3" (1.6 m)  Wt 122 lb (55.339 kg)  BMI 21.61 kg/m2  SpO2 99%  Body mass index is 21.61 kg/(m^2).        Review of Systems denies chest pain but does have dyspnea on exertion. Has had previous right upper placement. Only able to ambulate about one half block. Has history of hyperlipidemia and some psychiatric issues. The cardiac intervention by Dr. Mcelhaney. All other systems negative.    Objective:   Physical Exam blood pressure 124/64 heart rate 80 and respirations 12 Gen.-alert and oriented x3 in no apparent distress HEENT normal for age Lungs no rhonchi or wheezing Cardiovascular regular rhythm no murmurs carotid pulses 3+ palpable no bruit audible on the left. Abdomen soft nontender no palpable masses Musculoskeletal free of  major deformities Skin clear -no rashes Neurologic normal grossly has loss of her upper visual field otherwise normal  Lower extremities 3+ femoral and dorsalis pedis pulses palpable bilaterally with no edema  Today I reviewed her carotid duplex   exam performed on 12/24/2011 at [facility]. The patient has right ICA occlusion and probable 95-100% left ICA stenosis.    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 Nishan and proceed with left carotid endarterectomy on Thursday of this week if agreeable with him. Will discuss whether Plavix needs to be continued preoperatively with him. Have discussed potential risks of surgery and also bleeding and stroke with the patient and her cousin and they are accepting of  this.      

## 2012-01-09 ENCOUNTER — Encounter (HOSPITAL_COMMUNITY): Payer: Self-pay | Admitting: Vascular Surgery

## 2012-01-09 LAB — TYPE AND SCREEN
ABO/RH(D): A POS
Unit division: 0

## 2012-01-09 LAB — BASIC METABOLIC PANEL
CO2: 19 mEq/L (ref 19–32)
Chloride: 110 mEq/L (ref 96–112)
GFR calc Af Amer: 44 mL/min — ABNORMAL LOW (ref >90)
Potassium: 4.2 mEq/L (ref 3.5–5.1)
Sodium: 136 mEq/L (ref 135–145)

## 2012-01-09 LAB — CBC
Platelets: 197 10*3/uL (ref 150–400)
RBC: 3.4 MIL/uL — ABNORMAL LOW (ref 3.87–5.11)
RDW: 14.7 % (ref 11.5–15.5)
WBC: 6.9 10*3/uL (ref 4.0–10.5)

## 2012-01-09 MED ORDER — OXYCODONE HCL 5 MG PO TABS: 5.0000 mg | ORAL_TABLET | ORAL | Status: DC | PRN

## 2012-01-09 MED ORDER — OXYCODONE HCL 5 MG PO TABS: 5.0000 mg | ORAL_TABLET | ORAL | Status: AC | PRN

## 2012-01-09 NOTE — Discharge Summary (Signed)
[facility] Discharge Summary   Patient ID:  Caroline [name] MRN: [id number] DOB/AGE: 05/02/1951 61 y.o.  Admit date: 01/08/2012 Discharge date: 01/09/2012 Date of Surgery: 01/08/2012 Surgeon: Surgeon(s): Josephina Gip, MD  Admission Diagnosis: Left severe ICA stenosis; asymptomatic, and right ICA occlusion  Discharge Diagnoses:  Left severe ICA stenosis; asymptomatic, and right ICA occlusion  Secondary Diagnoses: Past Medical History  Diagnosis Date  . GERD (gastroesophageal reflux disease)   . Grave's disease   . Dyslipidemia   . CAD (coronary artery disease)     [facility]  Diagonal instent restenosis treated 10/2005  . Myocardial infarction     3 stents 1st 04-20-2003, has had several  . Heart murmur     states years ago  . Shortness of breath     with exertion  . Bright disease   . Cancer     right breast April 19, 2001  . Anxiety   . Arthritis     back  . CHF (congestive heart failure)     states she was told se had this in past by MD  . Angina   . Hypertension     states a long time ago-states she never took medication  . Depression     2002/04/19 had shock treatment due to depresson, spouse passed away April 19, 2000    Procedure(s): ENDARTERECTOMY CAROTID  Discharged Condition: good  HPI: HPI this 61 year old female was referred by Dr.Nishan for severe carotid occlusive disease. Patient had no history of definite carotid stenosis. She did lose half of the right visual field (upper half) which she relates to a fall a few years ago when she developed a small "knot" on her forehead. She has no history of lateralizing weakness, aphasia, amaurosis fugax in the left eye, or stroke. She does have a history of coronary artery disease having had cardiac stents in the past and is currently on Plavix and aspirin. He has been doing well from a cardiac standpoint    Hospital Course:  Caroline [name] is a 61 y.o. female is S/P Left Procedure(s): ENDARTERECTOMY CAROTID Extubated: POD  # 0 Post-op wounds clean, dry, intact or healing well Pt. Ambulating, voiding and taking PO diet without difficulty. Pt pain controlled with PO pain meds. Labs as below Complications:none  Consults:     Significant Diagnostic Studies: CBC Lab Results  Component Value Date   WBC 6.9 01/09/2012   HGB 10.0* 01/09/2012   HCT 30.4* 01/09/2012   MCV 89.4 01/09/2012   PLT 197 01/09/2012    BMET    Component Value Date/Time   NA 136 01/09/2012 0410   K 4.2 01/09/2012 0410   CL 110 01/09/2012 0410   CO2 19 01/09/2012 0410   GLUCOSE 106* 01/09/2012 0410   BUN 15 01/09/2012 0410   CREATININE 1.47* 01/09/2012 0410   CALCIUM 8.2* 01/09/2012 0410   GFRNONAA 38* 01/09/2012 0410   GFRAA 44* 01/09/2012 0410   COAG Lab Results  Component Value Date   INR 0.80 01/06/2012   INR 0.94 06/03/2010   INR 0.9 03/30/2009     Disposition:  Discharge to :Home   Caroline, [name]  Home Medication Instructions NUU:[id number]   Printed on:01/09/12 0747  Medication Information                    clonazepam (KLONOPIN) 2 MG disintegrating tablet Take 2 mg by mouth 3 (three) times daily.            QUEtiapine  Fumarate (SEROQUEL XR) 150 MG TB24 Take 150 mg by mouth daily with breakfast.            clopidogrel (PLAVIX) 75 MG tablet Take 1 tablet (75 mg total) by mouth daily.           QUEtiapine (SEROQUEL) 400 MG tablet Take 400 mg by mouth at bedtime.            aspirin 325 MG tablet Take 325 mg by mouth daily.           hydrOXYzine (ATARAX/VISTARIL) 25 MG tablet Take 25 mg by mouth every 8 (eight) hours as needed. For anxiety           isosorbide mononitrate (IMDUR) 30 MG 24 hr tablet Take 1 tablet (30 mg total) by mouth daily.            Verbal and written Discharge instructions given to the patient. Wound care per Discharge AVS F/U in 2 weeks Dr. Hart [place]  Signed: Thomasena Edis, Bhavya [facility] 01/09/2012, 7:47 AM

## 2012-01-09 NOTE — Progress Notes (Signed)
Patient ID: Caroline [name], female   DOB: July 10, 1951, 61 y.o.   MRN: [id number] Vascular Surgery Progress Note  Subjective: One day post left carotid endarterectomy. No specific complaints today. Swallowing well. Denies any hoarseness. Has ambulated in the hall. Voiding well.  Objective:  Filed Vitals:   01/09/12 0800  BP: 99/51  Pulse: 95  Temp: 98.5 F (36.9 C)  Resp: 17    General alert and oriented x3 Left neck incision healing nicely no hematoma Al Pimple drain in place-will DC today Neurologic exam normal   Labs:  Lab 01/09/12 0410 01/06/12 1317  CREATININE 1.47* 1.74*    Lab 01/09/12 0410 01/06/12 1317  NA 136 141  K 4.2 4.5  CL 110 105  CO2 19 27  BUN 15 26*  CREATININE 1.47* 1.74*  LABGLOM -- --  GLUCOSE 106* --  CALCIUM 8.2* 11.0*    Lab 01/09/12 0410 01/06/12 1317  WBC 6.9 7.3  HGB 10.0* 13.3  HCT 30.4* 39.8  PLT 197 234    Lab 01/06/12 1317  INR 0.80    I/O last 3 completed shifts: In: 3440 [P.O.:240; I.V.:2700; IV Piggyback:500] Out: 2410 [Urine:2350; Drains:60]  Imaging: No results found.  Assessment/Plan:  POD #1  LOS: 1 day  s/p Procedure(s): ENDARTERECTOMY CAROTID-left  Doing well 1 day post left carotid endarterectomy for severe stenosis on the left with right ICA occlusion. Plan DC JP drain DC home today return to office in 2 weeks   Josephina Gip, MD 01/09/2012 9:57 AM

## 2012-01-09 NOTE — Progress Notes (Signed)
01/09/2012 12:05 PM  Discussed discharge instructions and medications with patient and family member. Answered all questions. Vss. Pt has no complaints of pain. Surgical site is WNL.JP site is covered with dressing and is clean dry intact.  All IV's are dc'ed with tips intact. Pt discharged home with family.  Celesta Gentile 3/8/201312:05 PM'

## 2012-01-19 ENCOUNTER — Encounter: Payer: Self-pay | Admitting: Vascular Surgery

## 2012-01-20 ENCOUNTER — Encounter: Payer: Self-pay | Admitting: Vascular Surgery

## 2012-01-20 ENCOUNTER — Ambulatory Visit (INDEPENDENT_AMBULATORY_CARE_PROVIDER_SITE_OTHER): Payer: Medicare Other | Admitting: Vascular Surgery

## 2012-01-20 ENCOUNTER — Encounter: Payer: Medicare Other | Admitting: Vascular Surgery

## 2012-01-20 VITALS — BP 117/61 | HR 88 | Resp 16 | Ht 63.0 in | Wt 120.0 lb

## 2012-01-20 DIAGNOSIS — I6529 Occlusion and stenosis of unspecified carotid artery: Secondary | ICD-10-CM

## 2012-01-20 NOTE — Progress Notes (Signed)
Subjective:     Patient ID: Caroline [name], female   DOB: October 07, 1951, 61 y.o.   MRN: [id number]  HPI this 61 year old female returns for initial followup regarding her left carotid endarterectomy performed by me 2 weeks ago. She has total occlusion of her right ICA and a 95+ percent stenosis of the left ICA. She had previously lost part of the vision of her right eye due to a suspected retinal embolus she has a long history of tobacco abuse.. She has been trying hard to increase her smoking and is now smoking 2 cigarettes in the morning and 2 later in the day and probably 5-6 total per day I told her that she did need to aim 40 per day smoking a few in the morning and a few in the afternoon would not be a sustainable solution  Review of Systems     Objective:   Physical ExamBP 117/61  Pulse 88  Resp 16  Ht 5\' 3"  (1.6 m)  Wt 120 lb (54.432 kg)  BMI 21.26 kg/m2  SpO2 100%  General well-developed well-nourished female in no apparent distress alert and oriented x3 Left neck with well-healed incision. 3+ carotid pulse palpable. No bruits are audible. Neurologic exam grossly normal     Assessment:     Doing well 2 weeks post left carotid endarterectomy with known right ICA occlusion    Plan:     Return in 6 months with followup carotid duplex exam We'll be in touch with [place] if she has any new neurologic symptoms Currently taking Plavix We'll continue to work on smoking cessation

## 2012-01-21 NOTE — Progress Notes (Signed)
Addended by: Sharee Pimple on: 01/21/2012 09:17 AM   Modules accepted: Orders

## 2012-02-02 NOTE — Discharge Summary (Signed)
Already done

## 2012-04-17 ENCOUNTER — Emergency Department (HOSPITAL_BASED_OUTPATIENT_CLINIC_OR_DEPARTMENT_OTHER): Payer: Medicare Other

## 2012-04-17 ENCOUNTER — Other Ambulatory Visit: Payer: Self-pay

## 2012-04-17 ENCOUNTER — Emergency Department (HOSPITAL_BASED_OUTPATIENT_CLINIC_OR_DEPARTMENT_OTHER)
Admission: EM | Admit: 2012-04-17 | Discharge: 2012-04-17 | Disposition: A | Discharge status: Home / Self Care | Payer: Medicare Other | Attending: Emergency Medicine | Admitting: Emergency Medicine

## 2012-04-17 ENCOUNTER — Encounter (HOSPITAL_BASED_OUTPATIENT_CLINIC_OR_DEPARTMENT_OTHER): Payer: Self-pay | Admitting: *Deleted

## 2012-04-17 DIAGNOSIS — K219 Gastro-esophageal reflux disease without esophagitis: Secondary | ICD-10-CM | POA: Insufficient documentation

## 2012-04-17 DIAGNOSIS — Z7982 Long term (current) use of aspirin: Secondary | ICD-10-CM | POA: Insufficient documentation

## 2012-04-17 DIAGNOSIS — I509 Heart failure, unspecified: Secondary | ICD-10-CM | POA: Insufficient documentation

## 2012-04-17 DIAGNOSIS — F411 Generalized anxiety disorder: Secondary | ICD-10-CM | POA: Insufficient documentation

## 2012-04-17 DIAGNOSIS — Z79899 Other long term (current) drug therapy: Secondary | ICD-10-CM | POA: Insufficient documentation

## 2012-04-17 DIAGNOSIS — I10 Essential (primary) hypertension: Secondary | ICD-10-CM | POA: Insufficient documentation

## 2012-04-17 DIAGNOSIS — R440 Auditory hallucinations: Secondary | ICD-10-CM

## 2012-04-17 DIAGNOSIS — R443 Hallucinations, unspecified: Secondary | ICD-10-CM | POA: Insufficient documentation

## 2012-04-17 DIAGNOSIS — E785 Hyperlipidemia, unspecified: Secondary | ICD-10-CM | POA: Insufficient documentation

## 2012-04-17 DIAGNOSIS — F17200 Nicotine dependence, unspecified, uncomplicated: Secondary | ICD-10-CM | POA: Insufficient documentation

## 2012-04-17 DIAGNOSIS — E0500 Thyrotoxicosis with diffuse goiter without thyrotoxic crisis or storm: Secondary | ICD-10-CM | POA: Insufficient documentation

## 2012-04-17 DIAGNOSIS — Z9861 Coronary angioplasty status: Secondary | ICD-10-CM | POA: Insufficient documentation

## 2012-04-17 DIAGNOSIS — I252 Old myocardial infarction: Secondary | ICD-10-CM | POA: Insufficient documentation

## 2012-04-17 DIAGNOSIS — F419 Anxiety disorder, unspecified: Secondary | ICD-10-CM

## 2012-04-17 DIAGNOSIS — I2510 Atherosclerotic heart disease of native coronary artery without angina pectoris: Secondary | ICD-10-CM | POA: Insufficient documentation

## 2012-04-17 LAB — ETHANOL: Alcohol, Ethyl (B): <11 mg/dL (ref 0–11)

## 2012-04-17 LAB — URINALYSIS, ROUTINE W REFLEX MICROSCOPIC
Glucose, UA: NEGATIVE mg/dL
Ketones, ur: NEGATIVE mg/dL
Leukocytes, UA: NEGATIVE
Nitrite: NEGATIVE
Protein, ur: NEGATIVE mg/dL
pH: 6 (ref 5.0–8.0)

## 2012-04-17 LAB — COMPREHENSIVE METABOLIC PANEL
Albumin: 3.8 g/dL (ref 3.5–5.2)
Alkaline Phosphatase: 71 U/L (ref 39–117)
BUN: 12 mg/dL (ref 6–23)
Creatinine, Ser: 1.4 mg/dL — ABNORMAL HIGH (ref 0.50–1.10)
GFR calc Af Amer: 47 mL/min — ABNORMAL LOW (ref >90)
Glucose, Bld: 101 mg/dL — ABNORMAL HIGH (ref 70–99)
Potassium: 4.1 mEq/L (ref 3.5–5.1)
Total Protein: 7 g/dL (ref 6.0–8.3)

## 2012-04-17 LAB — DIFFERENTIAL
Basophils Relative: 1 % (ref 0–1)
Eosinophils Absolute: 0.1 10*3/uL (ref 0.0–0.7)
Eosinophils Relative: 2 % (ref 0–5)
Lymphs Abs: 1.5 10*3/uL (ref 0.7–4.0)
Monocytes Absolute: 0.4 10*3/uL (ref 0.1–1.0)
Monocytes Relative: 9 % (ref 3–12)

## 2012-04-17 LAB — CBC
HCT: 34.5 % — ABNORMAL LOW (ref 36.0–46.0)
MCV: 87.1 fL (ref 78.0–100.0)
RBC: 3.96 MIL/uL (ref 3.87–5.11)
WBC: 5 10*3/uL (ref 4.0–10.5)

## 2012-04-17 LAB — RAPID URINE DRUG SCREEN, HOSP PERFORMED
Benzodiazepines: POSITIVE — AB
Opiates: NOT DETECTED

## 2012-04-17 MED ORDER — ACETAMINOPHEN 325 MG PO TABS: 650.0000 mg | ORAL_TABLET | ORAL | Status: DC | PRN

## 2012-04-17 MED ORDER — ONDANSETRON HCL 8 MG PO TABS
4.0000 mg | ORAL_TABLET | Freq: Three times a day (TID) | ORAL | Status: DC | PRN
Start: 2012-04-17 — End: 2012-04-17

## 2012-04-17 MED ORDER — CLOPIDOGREL BISULFATE 75 MG PO TABS: 75.0000 mg | ORAL_TABLET | Freq: Every day | ORAL | Status: DC

## 2012-04-17 MED ORDER — IBUPROFEN 400 MG PO TABS: 600.0000 mg | ORAL_TABLET | Freq: Three times a day (TID) | ORAL | Status: DC | PRN

## 2012-04-17 MED ORDER — ALUM & MAG HYDROXIDE-SIMETH 200-200-20 MG/5ML PO SUSP: 30.0000 mL | ORAL | Status: DC | PRN

## 2012-04-17 MED ORDER — QUETIAPINE FUMARATE 400 MG PO TABS
400.0000 mg | ORAL_TABLET | Freq: Every day | ORAL | Status: DC
  Filled 2012-04-17: qty 1

## 2012-04-17 MED ORDER — CLONAZEPAM 1 MG PO TBDP
2.0000 mg | ORAL_TABLET | Freq: Three times a day (TID) | ORAL | Status: DC
  Filled 2012-04-17: qty 2

## 2012-04-17 MED ORDER — LORAZEPAM 1 MG PO TABS: 1.0000 mg | ORAL_TABLET | Freq: Three times a day (TID) | ORAL | Status: DC | PRN

## 2012-04-17 MED ORDER — ISOSORBIDE MONONITRATE ER 30 MG PO TB24
30.0000 mg | ORAL_TABLET | Freq: Every day | ORAL | Status: DC
  Filled 2012-04-17: qty 1

## 2012-04-17 MED ORDER — ASPIRIN 325 MG PO TABS: 325.0000 mg | ORAL_TABLET | Freq: Every day | ORAL | Status: DC

## 2012-04-17 MED ORDER — HYDROXYZINE HCL 25 MG PO TABS: 25.0000 mg | ORAL_TABLET | Freq: Three times a day (TID) | ORAL | Status: DC | PRN

## 2012-04-17 MED ORDER — ZOLPIDEM TARTRATE 5 MG PO TABS
5.0000 mg | ORAL_TABLET | Freq: Every evening | ORAL | Status: DC | PRN
  Filled 2012-04-17: qty 1

## 2012-04-17 NOTE — Discharge Instructions (Signed)
Anxiety and Panic Attacks Anxiety is your body's way of reacting to real danger or something you think is a danger. It may be fear or worry over a situation like losing your job. Sometimes the cause is not known. A panic attack is made up of physical signs like sweating, shaking, or chest pain. Anxiety and panic attacks may start suddenly. They may be strong. They may come at any time of day, even while sleeping. They may come at any time of life. Panic attacks are scary, but they do not harm you physically.  HOME CARE  Avoid any known causes of your anxiety.   Try to relax. Yoga may help. Tell yourself everything will be okay.   Exercise often.   Get expert advice and help (therapy) to stop anxiety or attacks from happening.   Avoid caffeine, alcohol, and drugs.   Only take medicine as told by your doctor.  GET HELP RIGHT AWAY IF:  Your attacks seem different than normal attacks.   Your problems are getting worse or concern you.  MAKE SURE YOU:  Understand these instructions.   Will watch your condition.   Will get help right away if you are not doing well or get worse.  Document Released: 11/22/2010 Document Revised: 10/09/2011 Document Reviewed: 11/22/2010 [facility] Patient Information 2012 [place], [place].  RESOURCE GUIDE  Dental Problems  Patients with Medicaid: [facility] [phone] [address]                                           [phone] Phone:  [phone]                                                   Phone:  [phone]  If unable to pay or uninsured, contact:  [facility] or [facility]. to become qualified for the adult dental clinic.  Chronic Pain Problems Contact [facility]  [phone] Patients need to be referred by their primary care doctor.  Insufficient Money for Medicine Contact [employer]:  call "211" or [facility] [phone].  No Primary  Care Doctor Call [facility]  [phone] Other agencies that provide inexpensive medical care    [facility]  [phone]    [facility]  [phone]    [facility]  [phone]    [facility]  [phone]    [facility]  [phone]    [facility]  [phone]  Psychological Services [facility]  [phone] [facility]  [phone] [facility]   [phone] (emergency services [phone])  Abuse/Neglect [facility] Child Abuse Hotline 213-420-1225 [facility] Child Abuse Hotline 986-579-5580 (After Hours)  Emergency Shelter [facility] 9104558677  Maternity Homes Room at the [place] [facility] [place] [phone] [place] Alert Services [phone]  MRSA Hotline #:   (952)069-4509    [facility] of [place]  [facility] Dept. [address] [place]                     [address]         [address]  Blondell Reveal Phone:  [phone]                                  Phone:  [phone]                   Phone:  [phone]  [facility] Phone:  [phone]  [facility] Child Abuse Hotline [phone] 3853731761 (After Hours)

## 2012-04-17 NOTE — ED Provider Notes (Signed)
D/W Mobile crisis. Cleared for discharge. Will f/u with patient tomorrow to check on her. Pt states she wants to go home and f/u with her psychiatrist on Monday. Denies active SI at this time. Precautions for return.    Forbes Cellar, MD 04/17/12 1928

## 2012-04-17 NOTE — ED Notes (Signed)
Pt care assumed.

## 2012-04-17 NOTE — ED Notes (Signed)
Pt's neighbor coming to pick pt up- pt ambulatory without need for assist- resource guide given

## 2012-04-17 NOTE — ED Notes (Signed)
[facility] Crisis staff talking with pt

## 2012-04-17 NOTE — ED Provider Notes (Addendum)
History     CSN: [id number]  Arrival date & time 04/17/12  1257   First MD Initiated Contact with Patient 04/17/12 1345      Chief Complaint  Patient presents with  . Anxiety    (Consider location/radiation/quality/duration/timing/severity/associated sxs/prior treatment) Patient is a 61 y.o. female presenting with anxiety. The history is provided by the patient and a friend.  Anxiety  She has noticed for the last 3 weeks that she has episodes of sweats. They are generally preceded by a sense that her neck is turning red. She has had increased anxiety and has been reticent to leave her home. She does relate that she has had auditory hallucinations stating that it was the devil tell in her that this will be her death bed. She denies suicidal or homicidal thoughts. She states she's been compliant with her medications including her syrup well on Klonopin. She denies fever or chills. She denies chest pain and dyspnea. She recently had a carotid endarterectomy on the left side and states the right side was 100% blocked and not amenable to surgery.  Past Medical History  Diagnosis Date  . GERD (gastroesophageal reflux disease)   . Grave's disease   . Dyslipidemia   . CAD (coronary artery disease)     [facility]  Diagonal instent restenosis treated 10/2005  . Myocardial infarction     3 stents 1st Apr 24, 2003, has had several  . Heart murmur     states years ago  . Shortness of breath     with exertion  . Bright disease   . Cancer     right breast 04-23-01  . Anxiety   . Arthritis     back  . CHF (congestive heart failure)     states she was told se had this in past by MD  . Angina   . Hypertension     states a long time ago-states she never took medication  . Depression     04-23-02 had shock treatment due to depresson, spouse passed away 2000/04/23  . Carotid artery occlusion     Past Surgical History  Procedure Date  . Breast lumpectomy     right breast   . Coronary angioplasty with  stent placement   . Joint replacement Apr 23, 2010    Left total hip replacement  . Abdominal hysterectomy     partial  . Mouth surgery   . Endarterectomy 01/08/2012    Procedure: ENDARTERECTOMY CAROTID;  Surgeon: Pryor Ochoa, MD;  Location: [facility] OR;  Service: Vascular;  Laterality: Left;  with Dacron patch angioplasty  . Carotid endarterectomy 01/08/12    Left    Family History  Problem Relation Age of Onset  . Coronary artery disease      History  Substance Use Topics  . Smoking status: Current Everyday Smoker -- 0.5 packs/day for 40 years    Types: Cigarettes  . Smokeless tobacco: Never Used  . Alcohol Use: No    OB History    Grav Para Term Preterm Abortions TAB SAB Ect Mult Living                  Review of Systems  All other systems reviewed and are negative.    Allergies  Amoxicillin and Codeine  Home Medications   Current Outpatient Rx  Name Route Sig Dispense Refill  . ASPIRIN 325 MG PO TABS Oral Take 325 mg by mouth daily.    [name] CLONAZEPAM 2 MG PO TBDP Oral  Take 2 mg by mouth 3 (three) times daily.     [name] CLOPIDOGREL BISULFATE 75 MG PO TABS Oral Take 1 tablet (75 mg total) by mouth daily. 30 tablet 3  . HYDROXYZINE HCL 25 MG PO TABS Oral Take 25 mg by mouth every 8 (eight) hours as needed. For anxiety    . ISOSORBIDE MONONITRATE ER 30 MG PO TB24 Oral Take 1 tablet (30 mg total) by mouth daily. 30 tablet 11  . QUETIAPINE FUMARATE 400 MG PO TABS Oral Take 400 mg by mouth at bedtime.     [name] QUETIAPINE FUMARATE ER 150 MG PO TB24 Oral Take 150 mg by mouth daily with breakfast.       BP 101/55  Pulse 106  Temp 97.8 F (36.6 C) (Oral)  Resp 18  Ht 5\' 3"  (1.6 m)  Wt 120 lb (54.432 kg)  BMI 21.26 kg/m2  SpO2 100%  Physical Exam  Nursing note and vitals reviewed.  61 year old female who appears somewhat anxious. Vital signs are significant for mild tachycardia with heart rate 106. Oxygen saturation is 100% which is normal. Head is normocephalic and atraumatic.  PERRLA, EOMI. Neck is nontender and supple. Right carotid bruit is heard. Back is nontender. Lungs are clear without rales, wheezes, or rhonchi. Heart has regular rate and rhythm without murmur. Abdomen is soft, flat, nontender without masses or hepatosplenomegaly. Extremities no cyanosis or edema, full range of motion is present. Skin is warm and dry without rash. Neurologic: She is awake and alert and oriented. Speech is somewhat slow and she seems to at times have difficulty thinking of the right word to say but neighbors were with her state that her speech pattern is normal for her. Cranial nerves are intact. There are no motor or sensory deficits. Psychiatric: She is somewhat anxious without homicidal or suicidal ideations. She has been having auditory hallucinations.  ED Course  Procedures (including critical care time)  Results for orders placed during the hospital encounter of 04/17/12  CBC      Component Value Range   WBC 5.0  4.0 - 10.5 K/uL   RBC 3.96  3.87 - 5.11 MIL/uL   Hemoglobin 12.0  12.0 - 15.0 g/dL   HCT 40.9 (*) 81.1 - 91.4 %   MCV 87.1  78.0 - 100.0 fL   MCH 30.3  26.0 - 34.0 pg   MCHC 34.8  30.0 - 36.0 g/dL   RDW 78.2  95.6 - 21.3 %   Platelets 230  150 - 400 K/uL  DIFFERENTIAL      Component Value Range   Neutrophils Relative 59  43 - 77 %   Neutro Abs 3.0  1.7 - 7.7 K/uL   Lymphocytes Relative 30  12 - 46 %   Lymphs Abs 1.5  0.7 - 4.0 K/uL   Monocytes Relative 9  3 - 12 %   Monocytes Absolute 0.4  0.1 - 1.0 K/uL   Eosinophils Relative 2  0 - 5 %   Eosinophils Absolute 0.1  0.0 - 0.7 K/uL   Basophils Relative 1  0 - 1 %   Basophils Absolute 0.0  0.0 - 0.1 K/uL  COMPREHENSIVE METABOLIC PANEL      Component Value Range   Sodium 134 (*) 135 - 145 mEq/L   Potassium 4.1  3.5 - 5.1 mEq/L   Chloride 100  96 - 112 mEq/L   CO2 25  19 - 32 mEq/L   Glucose, Bld 101 (*) 70 - 99  mg/dL   BUN 12  6 - 23 mg/dL   Creatinine, Ser 1.61 (*) 0.50 - 1.10 mg/dL   Calcium 9.4   8.4 - 09.6 mg/dL   Total Protein 7.0  6.0 - 8.3 g/dL   Albumin 3.8  3.5 - 5.2 g/dL   AST 13  0 - 37 U/L   ALT 6  0 - 35 U/L   Alkaline Phosphatase 71  39 - 117 U/L   Total Bilirubin 0.2 (*) 0.3 - 1.2 mg/dL   GFR calc non Af Amer 41 (*) >90 mL/min   GFR calc Af Amer 47 (*) >90 mL/min  ETHANOL      Component Value Range   Alcohol, Ethyl (B) <11  0 - 11 mg/dL   Dg Chest 2 View  0/45/4098  *RADIOLOGY REPORT*  Clinical Data: Anxiety.  CHEST - 2 VIEW  Comparison: Chest x-ray 01/06/2012.  Findings: Lung volumes are normal.  No consolidative airspace disease.  No pleural effusions.  No pneumothorax.  No pulmonary nodule or mass noted.  Pulmonary vasculature and the cardiomediastinal silhouette are within normal limits.  Surgical clips project over the lateral aspect of the right breast, likely from prior lumpectomy.  IMPRESSION: 1. No radiographic evidence of acute cardiopulmonary disease.  Original Report Authenticated By: Florencia Reasons, M.D.   Ct Head Wo Contrast  04/17/2012  *RADIOLOGY REPORT*  Clinical Data: Anxiety.  Suicidal ideations.  CT HEAD WITHOUT CONTRAST  Technique:  Contiguous axial images were obtained from the base of the skull through the vertex without contrast.  Comparison: Head CT 06/03/2010.  Findings: No acute intracranial abnormalities.  Specifically, no evidence of acute/subacute cerebral ischemia, no acute intracranial hemorrhage, no focal mass, mass effect, hydrocephalus or abnormal intra or extra-axial fluid collections.  There is mild cerebral and cerebellar atrophy which is slightly age advanced.  There also patchy and confluent areas of decreased attenuation throughout the deep and periventricular white matter of the cerebral hemispheres bilaterally, compatible with chronic microvascular ischemic changes. No acute displaced skull fractures are identified. Visualized paranasal sinuses and mastoids are well pneumatized.  IMPRESSION: 1.  No acute intracranial abnormalities.  2.  Cerebral and cerebellar atrophy with chronic microvascular ischemic changes in the white matter of the cerebral hemispheres bilaterally, as above.  Original Report Authenticated By: Florencia Reasons, M.D.      Date: 04/17/2012  Rate: 79  Rhythm: normal sinus rhythm  QRS Axis: normal  Intervals: PR prolonged  ST/T Wave abnormalities: nonspecific ST/T changes  Conduction Disutrbances:first-degree A-V block   Narrative Interpretation: First degree AV block, nonspecific ST and T changes. No old ECG available for comparison.  Old EKG Reviewed: none available    1. Anxiety   2. Auditory hallucination       MDM  Anxiety with hallucinations. She does state she has a history of depression which was treated with ECT at [facility]. Screening labs will be a cane and 10 head CT obtained as well as chest x-ray. OB looking for possible occult infection to account for her episodes of sweating. After screening labs were obtained, psychiatric consultation will be obtained. Of note, she does have an appointment with her psychiatrist already scheduled for 5 days from now.   Lab work is unremarkable. Drug screen is still pending. [facility] will be asked to see the patient to assist with either home care or placement.     Dione Booze, MD 04/17/12 1648  Dione Booze, MD 04/17/12 [phone]

## 2012-04-17 NOTE — ED Notes (Signed)
Hymie from [facility] is on the way to see patient-MT

## 2012-04-17 NOTE — ED Notes (Signed)
I spoke with pt's neighbor, Caroline [name]. She stated that she would come to pick up pt when she is discharged from care.

## 2012-04-17 NOTE — ED Notes (Signed)
Pt brought to ED by neighbors who feel she may be suicidal. Pt states she has been anxious for about 3 weeks. Breaking out in cold sweats, does not feel safe at home and is scared.

## 2012-04-23 ENCOUNTER — Encounter: Payer: Self-pay | Admitting: Internal Medicine

## 2012-04-23 ENCOUNTER — Ambulatory Visit (INDEPENDENT_AMBULATORY_CARE_PROVIDER_SITE_OTHER): Payer: Medicare Other | Admitting: Internal Medicine

## 2012-04-23 ENCOUNTER — Other Ambulatory Visit: Payer: Self-pay | Admitting: Internal Medicine

## 2012-04-23 VITALS — BP 108/62 | HR 90 | Temp 98.3°F | Resp 18 | Ht 62.5 in | Wt 113.0 lb

## 2012-04-23 DIAGNOSIS — E785 Hyperlipidemia, unspecified: Secondary | ICD-10-CM

## 2012-04-23 DIAGNOSIS — Z79899 Other long term (current) drug therapy: Secondary | ICD-10-CM

## 2012-04-23 DIAGNOSIS — D649 Anemia, unspecified: Secondary | ICD-10-CM

## 2012-04-23 DIAGNOSIS — R634 Abnormal weight loss: Secondary | ICD-10-CM

## 2012-04-23 DIAGNOSIS — C50919 Malignant neoplasm of unspecified site of unspecified female breast: Secondary | ICD-10-CM

## 2012-04-23 DIAGNOSIS — R232 Flushing: Secondary | ICD-10-CM | POA: Insufficient documentation

## 2012-04-23 DIAGNOSIS — J310 Chronic rhinitis: Secondary | ICD-10-CM

## 2012-04-23 DIAGNOSIS — I2510 Atherosclerotic heart disease of native coronary artery without angina pectoris: Secondary | ICD-10-CM

## 2012-04-23 DIAGNOSIS — N951 Menopausal and female climacteric states: Secondary | ICD-10-CM

## 2012-04-23 DIAGNOSIS — F341 Dysthymic disorder: Secondary | ICD-10-CM

## 2012-04-23 MED ORDER — FLUTICASONE PROPIONATE 50 MCG/ACT NA SUSP: 2.0000 | Freq: Every day | NASAL | Status: DC

## 2012-04-23 NOTE — Assessment & Plan Note (Signed)
?  post infectious rhinitis. Attempt flonase.

## 2012-04-23 NOTE — Assessment & Plan Note (Signed)
Obtain lipid/lft. 

## 2012-04-23 NOTE — Assessment & Plan Note (Signed)
Obtain tsh, free t4- grave's dz list on problem list. Obtain esr, chem7, lft

## 2012-04-23 NOTE — Assessment & Plan Note (Signed)
Minimal. Obtain b12.

## 2012-04-23 NOTE — Progress Notes (Signed)
Subjective:    Patient ID: Caroline [name], female    DOB: February 24, 1951, 61 y.o.   MRN: [id number]  HPI Pt presents to clinic to establish care and for evaluation of multiple medical problems. Recalls URI 5/13 with lingering nasal and chest congestion. Using nasal saline spray. Reviewed cxr 04/17/12 without acute finding. Also since that time notes intermittent episodes of feeling hot and neck turning red. Has sense of temperature intolerance. Has questions about menopausal or thyroid etiology. S/p hysterectomy in her 2023-04-13 with HRT for 7 years followed by cessation. No known thyroid disorder. Has h/o chronic anxiety and depression seeing psychiatry on a regular basis. Notes recent medication adjustment per psychiatry. Points out she feels lonely and isolated due to lack of contact with her daughter-psychiatrist aware per pt. Has intermittent dizziness without syncope. Did have recent fall with left periorbital echymosis. Feels that she escaped serious injury. Has unintended weight loss but states is not eating regularly- little food over the last 2 days.   Past Medical History  Diagnosis Date  . GERD (gastroesophageal reflux disease)   . Grave's disease   . Dyslipidemia   . CAD (coronary artery disease)     [facility]  Diagonal instent restenosis treated 10/2005  . Myocardial infarction     3 stents 1st 2003/04/13, has had several  . Heart murmur     states years ago  . Shortness of breath     with exertion  . Bright disease   . Cancer     right breast 04/12/2001  . Anxiety   . Arthritis     back  . CHF (congestive heart failure)     states she was told se had this in past by MD  . Angina   . Hypertension     states a long time ago-states she never took medication  . Depression     2002-04-12 had shock treatment due to depresson, spouse passed away 04/12/2000  . Carotid artery occlusion    Past Surgical History  Procedure Date  . Breast lumpectomy     right breast   . Coronary angioplasty with stent  placement   . Joint replacement 2010-04-12    Left total hip replacement  . Abdominal hysterectomy     partial  . Mouth surgery   . Endarterectomy 01/08/2012    Procedure: ENDARTERECTOMY CAROTID;  Surgeon: Pryor Ochoa, MD;  Location: [facility] OR;  Service: Vascular;  Laterality: Left;  with Dacron patch angioplasty  . Carotid endarterectomy 01/08/12    Left    reports that she has been smoking Cigarettes.  She has a 20 pack-year smoking history. She has never used smokeless tobacco. She reports that she does not drink alcohol or use illicit drugs. family history includes Coronary artery disease in her father; Diabetes in her sister; Hypertension in her mother; Pancreatic cancer in her father; and Prostate cancer in her father.  There is no history of Colon cancer. Allergies  Allergen Reactions  . Amoxicillin     REACTION: Reaction not known  . Codeine     REACTION: Reaction not known     Review of Systems  Constitutional: Positive for diaphoresis and unexpected weight change.  HENT: Positive for congestion and rhinorrhea.   Neurological: Positive for light-headedness. Negative for syncope.  Psychiatric/Behavioral: Positive for dysphoric mood. The patient is nervous/anxious.   All other systems reviewed and are negative.       Objective:   Physical Exam  Nursing note  and vitals reviewed. Constitutional: She appears well-developed and well-nourished. No distress.  HENT:  Head: Normocephalic and atraumatic.  Right Ear: Tympanic membrane, external ear and ear canal normal.  Left Ear: Tympanic membrane, external ear and ear canal normal.  Nose: Mucosal edema and rhinorrhea present.  Mouth/Throat: Oropharynx is clear and moist. No oropharyngeal exudate.  Eyes: Conjunctivae and EOM are normal. Pupils are equal, round, and reactive to light. Right eye exhibits no discharge. Left eye exhibits no discharge. No scleral icterus.       Slight tenderness periorbital. No bony abn along orbit.  +periorbital ecchymosis  Neck: Neck supple. Carotid bruit is present. No thyromegaly present.  Cardiovascular: Normal rate and regular rhythm.  Exam reveals no gallop and no friction rub.   Murmur heard. Pulmonary/Chest: Effort normal and breath sounds normal. No respiratory distress. She has no wheezes. She has no rales.  Lymphadenopathy:    She has no cervical adenopathy.  Neurological: She is alert.  Skin: Skin is warm and dry. She is not diaphoretic.       Left periorbital ecchymosis.  Psychiatric: Her behavior is normal. Judgment normal. Her mood appears anxious. Her speech is delayed. Her speech is not rapid and/or pressured, not tangential and not slurred. Thought content is not paranoid and not delusional. She exhibits a depressed mood. She expresses no homicidal and no suicidal ideation.          Assessment & Plan:

## 2012-04-23 NOTE — Assessment & Plan Note (Signed)
Continue close f/u with psychiatry

## 2012-04-23 NOTE — Assessment & Plan Note (Signed)
Obtain TFT's and estradiol/fsh/lh

## 2012-04-24 LAB — TSH: TSH: 2.731 u[IU]/mL (ref 0.350–4.500)

## 2012-04-24 LAB — VITAMIN B12: Vitamin B-12: 362 pg/mL (ref 211–911)

## 2012-04-24 LAB — HEPATIC FUNCTION PANEL
ALT: <8 U/L (ref 0–35)
AST: 14 U/L (ref 0–37)
Alkaline Phosphatase: 66 U/L (ref 39–117)
Bilirubin, Direct: 0.1 mg/dL (ref 0.0–0.3)
Total Bilirubin: 0.3 mg/dL (ref 0.3–1.2)

## 2012-04-24 LAB — BASIC METABOLIC PANEL
BUN: 17 mg/dL (ref 6–23)
CO2: 26 mEq/L (ref 19–32)
Calcium: 9.6 mg/dL (ref 8.4–10.5)
Creat: 1.48 mg/dL — ABNORMAL HIGH (ref 0.50–1.10)
Glucose, Bld: 109 mg/dL — ABNORMAL HIGH (ref 70–99)

## 2012-04-24 LAB — SEDIMENTATION RATE: Sed Rate: 13 mm/hr (ref 0–22)

## 2012-04-24 LAB — LIPID PANEL: Cholesterol: 264 mg/dL — ABNORMAL HIGH (ref 0–200)

## 2012-04-24 LAB — FOLLICLE STIMULATING HORMONE: FSH: 169.2 m[IU]/mL — ABNORMAL HIGH

## 2012-04-26 ENCOUNTER — Telehealth: Payer: Self-pay | Admitting: *Deleted

## 2012-04-26 ENCOUNTER — Encounter: Payer: Self-pay | Admitting: Internal Medicine

## 2012-04-26 NOTE — Progress Notes (Signed)
This encounter was created in error - please disregard.

## 2012-04-26 NOTE — Telephone Encounter (Signed)
Notified pt and advised her we will call her with the hormone level once we get the result back. Pt voices understanding.

## 2012-04-26 NOTE — Telephone Encounter (Signed)
Both thyroid tests nl. Kidney test mildly elevated but stable- looks like has been elevated in the past. Liver nl. Chol high.-low fat diet/exercise. Will talk about repeating it in the future. Menopause estrogen hormone test has to either be added or drawn

## 2012-04-26 NOTE — Telephone Encounter (Signed)
Received call from pt stating she received the message to disregard previous voicemail regarding lab error. Pt states she would like to know the results of her other labs. Please advise.

## 2012-04-27 ENCOUNTER — Telehealth: Payer: Self-pay | Admitting: *Deleted

## 2012-04-27 NOTE — Telephone Encounter (Signed)
Call returned to patient at [phone], she was informed per Dr Rodena Medin instructions and has verbalized understanding, and stated that she is willing to try the over the counter medication. No further action is required.

## 2012-04-27 NOTE — Telephone Encounter (Signed)
i told her that HRT could cause heart attacks, clots or breast cancer and was not generally recommended. She stated she understood. Can try otc black cohosh

## 2012-04-27 NOTE — Telephone Encounter (Signed)
Call placed to patient at [phone], to inform of lab results. She would like to know what she is suppose to do about her hormones. She stated that she has discussed with Dr Rodena Medin about taking estrogen, but was advised against it due to her heart condition. She would like to know if there is something else that she could safely take. She stated that she feels like she needs to be on some type of medication for her hormones.

## 2012-04-29 ENCOUNTER — Ambulatory Visit: Payer: Medicare Other | Admitting: Internal Medicine

## 2012-05-25 ENCOUNTER — Ambulatory Visit: Payer: Medicare Other | Admitting: Internal Medicine

## 2012-05-31 ENCOUNTER — Other Ambulatory Visit: Payer: Self-pay | Admitting: *Deleted

## 2012-05-31 DIAGNOSIS — I2510 Atherosclerotic heart disease of native coronary artery without angina pectoris: Secondary | ICD-10-CM

## 2012-05-31 MED ORDER — CLOPIDOGREL BISULFATE 75 MG PO TABS: 75.0000 mg | ORAL_TABLET | Freq: Every day | ORAL | Status: DC

## 2012-06-18 ENCOUNTER — Telehealth: Payer: Self-pay | Admitting: *Deleted

## 2012-06-18 NOTE — Telephone Encounter (Signed)
Patient returned phone call. Best # [phone]

## 2012-06-18 NOTE — Telephone Encounter (Signed)
LMOM with contact name & number for pt call back:  Message               Patient cancelled appt then no showed for appt. Needed follow up labs (chem7-hyponatremia, a1c-hyperglycemia and estradial-post   menopausal state). See if she will at least go to lab

## 2012-06-18 NOTE — Telephone Encounter (Signed)
Spoke w/patient about no-show fee from 07.23.13 OV; pt is attempting to get this fee waived & does not wish to schedule F/U visit and/or labs until this is taken care of. Informed pt I will check on the status of this request w/billing on Mon 08.19.13 and then f/u with her/SLS

## 2012-07-27 ENCOUNTER — Other Ambulatory Visit: Payer: Medicare Other

## 2012-07-27 ENCOUNTER — Ambulatory Visit: Payer: Medicare Other | Admitting: Neurosurgery

## 2012-08-03 ENCOUNTER — Ambulatory Visit: Payer: Medicare Other | Admitting: Internal Medicine

## 2012-08-10 ENCOUNTER — Ambulatory Visit: Payer: Medicare Other | Admitting: Internal Medicine

## 2012-08-16 ENCOUNTER — Other Ambulatory Visit: Payer: Medicare Other

## 2012-08-16 ENCOUNTER — Ambulatory Visit: Payer: Medicare Other | Admitting: Neurosurgery

## 2012-08-23 ENCOUNTER — Encounter: Payer: Self-pay | Admitting: Neurosurgery

## 2012-08-24 ENCOUNTER — Encounter: Payer: Self-pay | Admitting: Neurosurgery

## 2012-08-24 ENCOUNTER — Ambulatory Visit (INDEPENDENT_AMBULATORY_CARE_PROVIDER_SITE_OTHER): Payer: Medicare Other | Admitting: Neurosurgery

## 2012-08-24 ENCOUNTER — Other Ambulatory Visit (INDEPENDENT_AMBULATORY_CARE_PROVIDER_SITE_OTHER): Payer: Medicare Other | Admitting: *Deleted

## 2012-08-24 VITALS — BP 136/79 | HR 88 | Resp 16 | Ht 63.0 in | Wt 112.0 lb

## 2012-08-24 DIAGNOSIS — Z48812 Encounter for surgical aftercare following surgery on the circulatory system: Secondary | ICD-10-CM

## 2012-08-24 DIAGNOSIS — I6529 Occlusion and stenosis of unspecified carotid artery: Secondary | ICD-10-CM

## 2012-08-24 NOTE — Progress Notes (Signed)
[facility] Carotid Office Note  CC: Carotid surveillance Referring Physician: Hart Rochester  History of Present Illness: 61 year old female patient of Dr. Hart Rochester status post left CEA in March 2013. The patient denies any signs or symptoms of CVA, TIA, amaurosis fugax or any neural deficit.  Past Medical History  Diagnosis Date  . GERD (gastroesophageal reflux disease)   . Grave's disease   . Dyslipidemia   . CAD (coronary artery disease)     [facility]  Diagonal instent restenosis treated 10/2005  . Myocardial infarction     3 stents 1st 04-09-2003, has had several  . Heart murmur     states years ago  . Shortness of breath     with exertion  . Bright disease   . Cancer     right breast 04-08-2001  . Anxiety   . Arthritis     back  . CHF (congestive heart failure)     states she was told se had this in past by MD  . Angina   . Hypertension     states a long time ago-states she never took medication  . Depression     04/08/02 had shock treatment due to depresson, spouse passed away 04-08-00  . Carotid artery occlusion     ROS: [x]  Positive   [ ]  Denies    General: [ ]  Weight loss, [ ]  Fever, [ ]  chills Neurologic: [ ]  Dizziness, [ ]  Blackouts, [ ]  Seizure [ ]  Stroke, [ ]  "Mini stroke", [ ]  Slurred speech, [ ]  Temporary blindness; [ ]  weakness in arms or legs, [ ]  Hoarseness Cardiac: [ ]  Chest pain/pressure, [ ]  Shortness of breath at rest [ ]  Shortness of breath with exertion, [ ]  Atrial fibrillation or irregular heartbeat Vascular: [ ]  Pain in legs with walking, [ ]  Pain in legs at rest, [ ]  Pain in legs at night,  [ ]  Non-healing ulcer, [ ]  Blood clot in vein/DVT,   Pulmonary: [ ]  Home oxygen, [ ]  Productive cough, [ ]  Coughing up blood, [ ]  Asthma,  [ ]  Wheezing Musculoskeletal:  [ ]  Arthritis, [ ]  Low back pain, [ ]  Joint pain Hematologic: [ ]  Easy Bruising, [ ]  Anemia; [ ]  Hepatitis Gastrointestinal: [ ]  Blood in stool, [ ]  Gastroesophageal  Reflux/heartburn, [ ]  Trouble swallowing Urinary: [ ]  chronic Kidney disease, [ ]  on HD - [ ]  MWF or [ ]  TTHS, [ ]  Burning with urination, [ ]  Difficulty urinating Skin: [ ]  Rashes, [ ]  Wounds Psychological: [ ]  Anxiety, [ ]  Depression   Social History History  Substance Use Topics  . Smoking status: Current Every Day Smoker -- 0.5 packs/day for 40 years    Types: Cigarettes  . Smokeless tobacco: Never Used  . Alcohol Use: No    Family History Family History  Problem Relation Age of Onset  . Coronary artery disease Father   . Prostate cancer Father     not sure  . Pancreatic cancer Father     not sure  . Cancer Father   . Hypertension Mother   . Diabetes Sister   . Colon cancer Neg Hx     Allergies  Allergen Reactions  . Amoxicillin     REACTION: Reaction not known  . Codeine     REACTION: Reaction not known    Current Outpatient Prescriptions  Medication Sig Dispense Refill  . aspirin 325 MG tablet Take 325 mg by mouth daily.      [name]  Black Cohosh 540 MG CAPS Take 540 mg by mouth 2 (two) times daily.      . clonazepam (KLONOPIN) 2 MG disintegrating tablet Take 2 mg by mouth 3 (three) times daily.       . clopidogrel (PLAVIX) 75 MG tablet Take 1 tablet (75 mg total) by mouth daily.  30 tablet  3  . fish oil-omega-3 fatty acids 1000 MG capsule Take 100 mg by mouth daily.      . fluticasone (FLONASE) 50 MCG/ACT nasal spray Place 2 sprays into the nose daily.  16 g  6  . hydrOXYzine (ATARAX/VISTARIL) 25 MG tablet Take 25 mg by mouth every 8 (eight) hours as needed. For anxiety      . isosorbide mononitrate (IMDUR) 30 MG 24 hr tablet Take 1 tablet (30 mg total) by mouth daily.  30 tablet  11  . QUEtiapine (SEROQUEL) 400 MG tablet Take 400 mg by mouth at bedtime.       [name] QUEtiapine Fumarate (SEROQUEL XR) 150 MG TB24 Take 300 mg by mouth daily with breakfast.       . risperiDONE (RISPERDAL) 2 MG tablet Take 1 mg by mouth 3 (three) times daily.        Physical  Examination  Filed Vitals:   08/24/12 1512  BP: 136/79  Pulse: 88  Resp:     Body mass index is 19.84 kg/(m^2).  General:  WDWN in NAD Gait: Normal HEENT: WNL Eyes: Pupils equal Pulmonary: normal non-labored breathing , without Rales, rhonchi,  wheezing Cardiac: RRR, without  Murmurs, rubs or gallops; Abdomen: soft, NT, no masses Skin: no rashes, ulcers noted  Vascular Exam Pulses: 3+ radial pulses bilaterally Carotid bruits: Carotid pulse on the left with known right occlusion Extremities without ischemic changes, no Gangrene , no cellulitis; no open wounds;  Musculoskeletal: no muscle wasting or atrophy   Neurologic: A&O X 3; Appropriate Affect ; SENSATION: normal; MOTOR FUNCTION:  moving all extremities equally. Speech is fluent/normal  Non-Invasive Vascular Imaging CAROTID DUPLEX 08/24/2012  Right ICA 0ccluded stenosis Left ICA 0 - 19% stenosis   ASSESSMENT/PLAN: Asymptomatic patient with no evidence of hyperplasia or restenosis on the left. The patient will followup in 6 months for repeat carotid duplex. The patient has requested to see Dr. Hart Rochester and she will see him after that duplex. The patient's questions were encouraged and answered, she is in agreement with this plan.  Lauree Chandler ANP   Clinic MD: Hart Rochester

## 2012-08-25 NOTE — Addendum Note (Signed)
Addended by: Sharee Pimple on: 08/25/2012 08:33 AM   Modules accepted: Orders

## 2012-09-07 ENCOUNTER — Ambulatory Visit: Payer: Medicare Other | Admitting: Internal Medicine

## 2012-11-16 ENCOUNTER — Other Ambulatory Visit: Payer: Self-pay

## 2012-11-16 DIAGNOSIS — I2510 Atherosclerotic heart disease of native coronary artery without angina pectoris: Secondary | ICD-10-CM

## 2012-11-16 MED ORDER — CLOPIDOGREL BISULFATE 75 MG PO TABS: 75.0000 mg | ORAL_TABLET | Freq: Every day | ORAL | Status: DC

## 2012-11-26 ENCOUNTER — Ambulatory Visit: Payer: Medicare Other | Admitting: Cardiovascular Disease

## 2012-12-09 ENCOUNTER — Ambulatory Visit (INDEPENDENT_AMBULATORY_CARE_PROVIDER_SITE_OTHER): Payer: Medicare Other | Admitting: Cardiovascular Disease

## 2012-12-09 ENCOUNTER — Encounter: Payer: Self-pay | Admitting: Cardiovascular Disease

## 2012-12-09 VITALS — BP 102/62 | HR 98 | Ht 63.0 in | Wt 112.1 lb

## 2012-12-09 DIAGNOSIS — I2510 Atherosclerotic heart disease of native coronary artery without angina pectoris: Secondary | ICD-10-CM

## 2012-12-09 DIAGNOSIS — F341 Dysthymic disorder: Secondary | ICD-10-CM

## 2012-12-09 DIAGNOSIS — R0989 Other specified symptoms and signs involving the circulatory and respiratory systems: Secondary | ICD-10-CM

## 2012-12-09 DIAGNOSIS — E785 Hyperlipidemia, unspecified: Secondary | ICD-10-CM

## 2012-12-09 NOTE — Patient Instructions (Signed)
Your physician wants you to follow-up in:   6 MONTHS WITH DR Haywood Filler will receive a reminder letter in the mail two months in advance. If you don't receive a letter, please call our office to schedule the follow-up appointment. Your physician recommends that you continue on your current medications as directed. Please refer to the Current Medication list given to you today. DR Gwendlyn Deutscher ORAL SURGEON

## 2012-12-09 NOTE — Assessment & Plan Note (Signed)
Cholesterol is at goal.  Continue current dose of statin and diet Rx.  No myalgias or side effects.  F/U  LFT's in 6 months. Lab Results  Component Value Date   LDLCALC 172* 04/23/2012

## 2012-12-09 NOTE — Assessment & Plan Note (Signed)
Stable with no angina and good activity level.  Continue medical Rx  She prefers to be on Plavix

## 2012-12-09 NOTE — Assessment & Plan Note (Signed)
Continue f/u psychiatry  Affect is odd but she appears functional

## 2012-12-09 NOTE — Assessment & Plan Note (Signed)
RICA occluded bruit ? From ECA  Surveilence duplex with Dr Hart Rochester 6 months  Post LCEA 3/13

## 2012-12-09 NOTE — Progress Notes (Signed)
Patient ID: Caroline [name], female   DOB: 12/24/1950, 62 y.o.   MRN: [id number] Caroline [name] is S/P recent reintervention to her diagonal branch by Dr. Tommy Medal 2011. unfortunately she has some psychiatric issues and financial issues and is noncompliant with her medications. He has not been taking aspirin. Continues to smoke. Her counselor for less than 10 minutes. Hopefully she will start nicorrette gum which seems to be her choice. Encouraged her to go to [facility] for [employer] and get the least expensive nicotine patches she can climb. Her psychiatric medicines I would be hesitant to start her on Wellbutrin or Chantix in addition to her other medications that are centrally acting. Were able to get assistance for her Plavix and she picked up samples recently. She has a long-standing relationship with a psychiatrist that sees her every month or two. otherwise she's not had a significant palpitations PND or orthopnea no syncope or lower extremity edema. She is complaining about her teeth and may need them pulled. I think she will be ok stopping her Plavix for this althought she really is at risk for recurrent events given her smoking   Ok to stop Plavix as her redo stent was over two years ago  Has carotid disease with RICA occlusion and left CEA 3/13 by Dr Hart [place]   ROS: Denies fever, malais, weight loss, blurry vision, decreased visual acuity, cough, sputum, SOB, hemoptysis, pleuritic pain, palpitaitons, heartburn, abdominal pain, melena, lower extremity edema, claudication, or rash.  All other systems reviewed and negative  General: Affect appropriate Healthy:  appears stated age HEENT: normal Neck supple with no adenopathy JVP normal left CEA bilateral  bruits no thyromegaly Lungs clear with no wheezing and good diaphragmatic motion Heart:  S1/S2 no murmur, no rub, gallop or click PMI normal Abdomen: benighn, BS positve, no tenderness, no AAA no bruit.  No HSM or HJR Distal pulses intact with no  bruits No edema Neuro non-focal Skin warm and dry No muscular weakness   Current Outpatient Prescriptions  Medication Sig Dispense Refill  . aspirin 325 MG tablet Take 325 mg by mouth daily.      . Black Cohosh 540 MG CAPS Take 540 mg by mouth 2 (two) times daily.      . clonazepam (KLONOPIN) 2 MG disintegrating tablet Take 2 mg by mouth 3 (three) times daily.       . clopidogrel (PLAVIX) 75 MG tablet Take 1 tablet (75 mg total) by mouth daily.  30 tablet  3  . fish oil-omega-3 fatty acids 1000 MG capsule Take 100 mg by mouth daily.      . fluticasone (FLONASE) 50 MCG/ACT nasal spray Place 2 sprays into the nose daily.  16 g  6  . hydrOXYzine (ATARAX/VISTARIL) 25 MG tablet Take 25 mg by mouth every 8 (eight) hours as needed. For anxiety      . isosorbide mononitrate (IMDUR) 30 MG 24 hr tablet Take 1 tablet (30 mg total) by mouth daily.  30 tablet  11  . QUEtiapine (SEROQUEL) 400 MG tablet Take 400 mg by mouth at bedtime.       [name] QUEtiapine Fumarate (SEROQUEL XR) 150 MG TB24 Take 300 mg by mouth daily with breakfast.       . risperiDONE (RISPERDAL) 2 MG tablet Take 1 mg by mouth 3 (three) times daily.        Allergies  Amoxicillin and Codeine  Electrocardiogram   NSR rate 92 lateral T wave changes   Assessment and Plan

## 2013-01-12 ENCOUNTER — Other Ambulatory Visit: Payer: Self-pay | Admitting: *Deleted

## 2013-01-12 MED ORDER — ISOSORBIDE MONONITRATE ER 30 MG PO TB24: 30.0000 mg | ORAL_TABLET | Freq: Every day | ORAL | Status: DC

## 2013-01-12 NOTE — Telephone Encounter (Signed)
Patient calling to have rx Isosorbide Monoitrate 30 mg once daily refilled at [facility].  #30, Guilford Shi, CMA

## 2013-02-21 ENCOUNTER — Encounter: Payer: Self-pay | Admitting: Vascular Surgery

## 2013-02-22 ENCOUNTER — Other Ambulatory Visit: Payer: Medicare Other

## 2013-02-22 ENCOUNTER — Ambulatory Visit: Payer: Medicare Other | Admitting: Vascular Surgery

## 2013-03-16 ENCOUNTER — Other Ambulatory Visit: Payer: Self-pay | Admitting: *Deleted

## 2013-03-16 DIAGNOSIS — I2510 Atherosclerotic heart disease of native coronary artery without angina pectoris: Secondary | ICD-10-CM

## 2013-03-16 MED ORDER — CLOPIDOGREL BISULFATE 75 MG PO TABS: 75.0000 mg | ORAL_TABLET | Freq: Every day | ORAL | Status: DC

## 2013-04-19 ENCOUNTER — Telehealth: Payer: Self-pay | Admitting: *Deleted

## 2013-04-19 NOTE — Telephone Encounter (Signed)
LMOM with contact name and number for return call RE: Personal Health Visit F/U paperwork received stating Dr. Artist Pais as PCP, last OV was with Dr. Rodena Medin on 06.21.13; informed pt that she would need OV if wanting to stay with [place] office for completion of paperwork/SLS

## 2013-04-25 ENCOUNTER — Encounter: Payer: Self-pay | Admitting: Vascular Surgery

## 2013-04-26 ENCOUNTER — Ambulatory Visit (INDEPENDENT_AMBULATORY_CARE_PROVIDER_SITE_OTHER): Payer: Medicare Other | Admitting: Vascular Surgery

## 2013-04-26 ENCOUNTER — Encounter: Payer: Self-pay | Admitting: Vascular Surgery

## 2013-04-26 ENCOUNTER — Other Ambulatory Visit (INDEPENDENT_AMBULATORY_CARE_PROVIDER_SITE_OTHER): Payer: Medicare Other | Admitting: *Deleted

## 2013-04-26 DIAGNOSIS — I6529 Occlusion and stenosis of unspecified carotid artery: Secondary | ICD-10-CM

## 2013-04-26 DIAGNOSIS — I63239 Cerebral infarction due to unspecified occlusion or stenosis of unspecified carotid arteries: Secondary | ICD-10-CM | POA: Insufficient documentation

## 2013-04-26 DIAGNOSIS — Z48812 Encounter for surgical aftercare following surgery on the circulatory system: Secondary | ICD-10-CM

## 2013-04-26 NOTE — Progress Notes (Signed)
Subjective:     Patient ID: Caroline [name], female   DOB: 01-23-1951, 62 y.o.   MRN: [id number]  HPI this 62 year old female returns for followup regarding her carotid occlusive disease. She has a known right ICA occlusion. General left carotid endarterectomy performed by me in March of 2013 for severe left ICA stenosis. She has had no neurologic symptoms since that time. She denies lateralizing weakness, amaurosis fugax, aphasia, diplopia, blurred vision, or syncope.  Past Medical History  Diagnosis Date  . GERD (gastroesophageal reflux disease)   . Grave's disease   . Dyslipidemia   . CAD (coronary artery disease)     [facility]  Diagonal instent restenosis treated 10/2005  . Myocardial infarction     3 stents 1st 04-01-03, has had several  . Heart murmur     states years ago  . Shortness of breath     with exertion  . Bright disease   . Cancer     right breast 2001-03-31  . Anxiety   . Arthritis     back  . CHF (congestive heart failure)     states she was told se had this in past by MD  . Angina   . Hypertension     states a long time ago-states she never took medication  . Depression     2002/03/31 had shock treatment due to depresson, spouse passed away 31-Mar-2000  . Carotid artery occlusion     History  Substance Use Topics  . Smoking status: Current Every Day Smoker -- 0.50 packs/day for 40 years    Types: Cigarettes  . Smokeless tobacco: Never Used  . Alcohol Use: No    Family History  Problem Relation Age of Onset  . Coronary artery disease Father   . Prostate cancer Father     not sure  . Pancreatic cancer Father     not sure  . Cancer Father   . Hypertension Mother   . Diabetes Sister   . Colon cancer Neg Hx     Allergies  Allergen Reactions  . Amoxicillin     REACTION: Reaction not known  . Codeine     REACTION: Reaction not known    Current outpatient prescriptions:aspirin 325 MG tablet, Take 325 mg by mouth daily., Disp: , Rfl: ;  Black Cohosh 540 MG CAPS,  Take 540 mg by mouth 2 (two) times daily., Disp: , Rfl: ;  clonazepam (KLONOPIN) 2 MG disintegrating tablet, Take 2 mg by mouth 3 (three) times daily. , Disp: , Rfl: ;  clopidogrel (PLAVIX) 75 MG tablet, Take 1 tablet (75 mg total) by mouth daily., Disp: 30 tablet, Rfl: 6 fish oil-omega-3 fatty acids 1000 MG capsule, Take 100 mg by mouth daily., Disp: , Rfl: ;  hydrOXYzine (ATARAX/VISTARIL) 25 MG tablet, Take 25 mg by mouth 3 (three) times daily. For anxiety, Disp: , Rfl: ;  isosorbide mononitrate (IMDUR) 30 MG 24 hr tablet, Take 1 tablet (30 mg total) by mouth daily., Disp: 30 tablet, Rfl: 6;  QUEtiapine (SEROQUEL) 400 MG tablet, Take 400 mg by mouth at bedtime. , Disp: , Rfl:  QUEtiapine Fumarate (SEROQUEL XR) 150 MG TB24, Take 300 mg by mouth 2 (two) times daily. , Disp: , Rfl: ;  risperiDONE (RISPERDAL) 2 MG tablet, Take 1 mg by mouth 3 (three) times daily., Disp: , Rfl: ;  fluticasone (FLONASE) 50 MCG/ACT nasal spray, Place 2 sprays into the nose daily., Disp: 16 g, Rfl: 6  BP 104/67  Pulse 98  Resp 16  Ht 5\' 3"  (1.6 m)  Wt 112 lb (50.803 kg)  BMI 19.84 kg/m2  Body mass index is 19.84 kg/(m^2).           Review of Systems patient has history of coronary artery disease with previous stents and does have chronic occasional chest discomfort. Denies dyspnea on exertion, PND, orthopnea. Complains of significant lower back pain.    Objective:   Physical Exam pressure of 467 heart rate 90 respirations 16 Gen.-alert and oriented x3 in no apparent distress HEENT normal for age Lungs no rhonchi or wheezing Cardiovascular regular rhythm no murmurs carotid pulses 3+ palpable no bruits audible Abdomen soft nontender no palpable masses Musculoskeletal free of  major deformities Skin clear -no rashes Neurologic normal Lower extremities 3+ femoral and dorsalis pedis pulses palpable bilaterally with no edema  Today I ordered a carotid duplex exam which I reviewed and interpreted. Right ICA is  occluded as 9. The left carotid endarterectomy site is widely patent no evidence of restenosis    Assessment:     Chronic right ICA occlusion and previous left carotid endarterectomy March 2013-widely patent-asymptomatic    Plan:     Return in one year for carotid duplex exam and see nurse practitioner

## 2013-04-26 NOTE — Addendum Note (Signed)
Addended by: Adria Dill L on: 04/26/2013 04:46 PM   Modules accepted: Orders

## 2013-07-20 ENCOUNTER — Ambulatory Visit (INDEPENDENT_AMBULATORY_CARE_PROVIDER_SITE_OTHER): Payer: Medicare Other | Admitting: Cardiovascular Disease

## 2013-07-20 ENCOUNTER — Encounter: Payer: Self-pay | Admitting: Cardiovascular Disease

## 2013-07-20 VITALS — BP 115/70 | HR 94 | Wt 118.0 lb

## 2013-07-20 DIAGNOSIS — R079 Chest pain, unspecified: Secondary | ICD-10-CM

## 2013-07-20 DIAGNOSIS — I2510 Atherosclerotic heart disease of native coronary artery without angina pectoris: Secondary | ICD-10-CM

## 2013-07-20 DIAGNOSIS — I63239 Cerebral infarction due to unspecified occlusion or stenosis of unspecified carotid arteries: Secondary | ICD-10-CM

## 2013-07-20 DIAGNOSIS — R0989 Other specified symptoms and signs involving the circulatory and respiratory systems: Secondary | ICD-10-CM

## 2013-07-20 DIAGNOSIS — E785 Hyperlipidemia, unspecified: Secondary | ICD-10-CM

## 2013-07-20 NOTE — Assessment & Plan Note (Signed)
Chest pain  Atypical  F/U lexiscan myovue

## 2013-07-20 NOTE — Progress Notes (Signed)
Patient ID: Caroline [name], female   DOB: 18-Sep-1951, 62 y.o.   MRN: [id number] Caroline [name] is S/P recent reintervention to her diagonal branch by Dr. Tommy Medal 2011. unfortunately she has some psychiatric issues and financial issues and is noncompliant with her medications. He has not been taking aspirin. Continues to smoke. Her counselor for less than 10 minutes. Hopefully she will start nicorrette gum which seems to be her choice. Encouraged her to go to [facility] for [employer] and get the least expensive nicotine patches she can climb. Her psychiatric medicines I would be hesitant to start her on Wellbutrin or Chantix in addition to her other medications that are centrally acting. Larey Seat on her way to clinic today Chronic dizzyness  Complains of chest pain Not exertional  Intermitant but can be frequent Not taking nitro for it  Carotid done VVS: June/14  occluede RICA no stenosis in left and antegrade bilateral vertebral flow  ROS: Denies fever, malais, weight loss, blurry vision, decreased visual acuity, cough, sputum, SOB, hemoptysis, pleuritic pain, palpitaitons, heartburn, abdominal pain, melena, lower extremity edema, claudication, or rash.  All other systems reviewed and negative  General: Affect appropriate Healthy:  appears stated age HEENT: normal Neck supple with no adenopathy JVP normal bilateral  bruits no thyromegaly Lungs clear with no wheezing and good diaphragmatic motion Heart:  S1/S2 no murmur, no rub, gallop or click PMI normal Abdomen: benighn, BS positve, no tenderness, no AAA no bruit.  No HSM or HJR Distal pulses intact with no bruits No edema Neuro non-focal Skin warm and dry No muscular weakness   Current Outpatient Prescriptions  Medication Sig Dispense Refill  . aspirin 325 MG tablet Take 325 mg by mouth daily.      . Black Cohosh 540 MG CAPS Take 540 mg by mouth 2 (two) times daily.      . clonazepam (KLONOPIN) 2 MG disintegrating tablet Take 2 mg by mouth 3 (three)  times daily.       . clopidogrel (PLAVIX) 75 MG tablet Take 1 tablet (75 mg total) by mouth daily.  30 tablet  6  . fish oil-omega-3 fatty acids 1000 MG capsule Take 100 mg by mouth daily.      . fluticasone (FLONASE) 50 MCG/ACT nasal spray Place 2 sprays into the nose daily.  16 g  6  . hydrOXYzine (ATARAX/VISTARIL) 25 MG tablet Take 25 mg by mouth 3 (three) times daily. For anxiety      . isosorbide mononitrate (IMDUR) 30 MG 24 hr tablet Take 1 tablet (30 mg total) by mouth daily.  30 tablet  6  . QUEtiapine (SEROQUEL) 300 MG tablet Take 300 mg by mouth 2 (two) times daily.      . QUEtiapine (SEROQUEL) 400 MG tablet Take 400 mg by mouth at bedtime.       . risperiDONE (RISPERDAL) 2 MG tablet Take 1 mg by mouth 3 (three) times daily.       No current facility-administered medications for this visit.    Allergies  Amoxicillin and Codeine  Electrocardiogram:  Assessment and Plan

## 2013-07-20 NOTE — Assessment & Plan Note (Signed)
Cholesterol is at goal.  Continue current dose of statin and diet Rx.  No myalgias or side effects.  F/U  LFT's in 6 months. Lab Results  Component Value Date   LDLCALC 172* 04/23/2012  Not compliant with meds Discussed

## 2013-07-20 NOTE — Patient Instructions (Addendum)
Your physician wants you to follow-up in:    6 MONTHS WITH DR Haywood Filler will receive a reminder letter in the mail two months in advance. If you don't receive a letter, please call our office to schedule the follow-up appointment. Your physician has requested that you have a lexiscan myoview. For further information please visit [email or web address] Please follow instruction sheet, as given.  Your physician has requested that you have a carotid duplex. This test is an ultrasound of the carotid arteries in your neck. It looks at blood flow through these arteries that supply the brain with blood. Allow one hour for this exam. There are no restrictions or special instructions.

## 2013-07-20 NOTE — Assessment & Plan Note (Signed)
F/U duplex in December  Dizzyness should not be related to great vessel flow

## 2013-08-11 ENCOUNTER — Encounter: Payer: Self-pay | Admitting: Cardiovascular Disease

## 2013-08-16 ENCOUNTER — Ambulatory Visit (HOSPITAL_COMMUNITY): Payer: Medicare Other | Attending: Cardiovascular Disease | Admitting: Radiology

## 2013-08-16 VITALS — BP 128/62 | Ht 63.0 in | Wt 118.0 lb

## 2013-08-16 DIAGNOSIS — R0989 Other specified symptoms and signs involving the circulatory and respiratory systems: Secondary | ICD-10-CM | POA: Insufficient documentation

## 2013-08-16 DIAGNOSIS — F17200 Nicotine dependence, unspecified, uncomplicated: Secondary | ICD-10-CM | POA: Insufficient documentation

## 2013-08-16 DIAGNOSIS — R0609 Other forms of dyspnea: Secondary | ICD-10-CM | POA: Insufficient documentation

## 2013-08-16 DIAGNOSIS — R002 Palpitations: Secondary | ICD-10-CM | POA: Insufficient documentation

## 2013-08-16 DIAGNOSIS — R079 Chest pain, unspecified: Secondary | ICD-10-CM | POA: Insufficient documentation

## 2013-08-16 DIAGNOSIS — Z8249 Family history of ischemic heart disease and other diseases of the circulatory system: Secondary | ICD-10-CM | POA: Insufficient documentation

## 2013-08-16 DIAGNOSIS — I2510 Atherosclerotic heart disease of native coronary artery without angina pectoris: Secondary | ICD-10-CM

## 2013-08-16 DIAGNOSIS — I6529 Occlusion and stenosis of unspecified carotid artery: Secondary | ICD-10-CM | POA: Insufficient documentation

## 2013-08-16 DIAGNOSIS — R5381 Other malaise: Secondary | ICD-10-CM | POA: Insufficient documentation

## 2013-08-16 MED ORDER — TECHNETIUM TC 99M SESTAMIBI GENERIC - CARDIOLITE
33.0000 | Freq: Once | INTRAVENOUS | Status: AC | PRN
  Administered 2013-08-16: 33 via INTRAVENOUS

## 2013-08-16 MED ORDER — TECHNETIUM TC 99M SESTAMIBI GENERIC - CARDIOLITE
11.0000 | Freq: Once | INTRAVENOUS | Status: AC | PRN
  Administered 2013-08-16: 11 via INTRAVENOUS

## 2013-08-16 MED ORDER — REGADENOSON 0.4 MG/5ML IV SOLN
0.4000 mg | Freq: Once | INTRAVENOUS | Status: AC
  Administered 2013-08-16: 0.4 mg via INTRAVENOUS

## 2013-08-16 NOTE — Progress Notes (Signed)
[facility] SITE 3 NUCLEAR MED [address] [place], [place] [address] [phone]    Cardiology Nuclear Med Study  Caroline [name] is a 62 y.o. female     MRN : [id number]     DOB: 12/02/1950  Procedure Date: 08/16/2013  Nuclear Med Background Indication for Stress Test:  Evaluation for Ischemia and Stent Patency History: '05 MI; '09 LAD stent; '10 Myocardial Perfusion Study, no ischemia, EF 38%; '10 Cath>PTCA of LAD stent, EF 35%; '10 Echo 40% Cardiac Risk Factors: Carotid Disease, Family History - CAD, Smoker; Lipids Symptoms:  Chest Pain, Diaphoresis, DOE, Fatigue, Palpitations and SOB   Nuclear Pre-Procedure Caffeine/Decaff Intake:  None>12 hours NPO After: 4:00pm   Lungs:  clear O2 Sat: 97% on room air. IV 0.9% NS with Angio Cath:  22g  IV Site: R Antecubital  IV Started by:  Rickard Patience  Chest Size (in):  34 Cup Size: B  Height: 5\' 3"  (1.6 m)  Weight:  118 lb (53.524 kg)  BMI:  Body mass index is 20.91 kg/(m^2). Tech Comments:  Pictures checked, no obvious issues.    Nuclear Med Study 1 or 2 day study: 1 day  Stress Test Type:  Eugenie Birks  Reading MD: Charlton Haws, MD  Order Authorizing Provider:  Charlton Haws, MD  Resting Radionuclide: Technetium 60m Sestamibi  Resting Radionuclide Dose: 11.0 mCi   Stress Radionuclide:  Technetium 46m Sestamibi  Stress Radionuclide Dose: 33.0 mCi           Stress Protocol Rest HR: 81 Stress HR: 92  Rest BP: 128/62 Stress BP: 132/57  Exercise Time (min): n/a METS: n/a   Predicted Max HR: 160 bpm % Max HR: 57.5 bpm Rate Pressure Product: 21308   Dose of Adenosine (mg):  n/a Dose of Lexiscan: 0.4 mg  Dose of Atropine (mg): n/a Dose of Dobutamine: n/a mcg/kg/min (at max HR)  Stress Test Technologist: Milana Na, EMT-P  Nuclear Technologist:  Doyne Keel, CNMT     Rest Procedure:  Myocardial perfusion imaging was performed at rest 45 minutes following the intravenous administration of Technetium 74m  Sestamibi. Rest ECG: NSR with non-specific ST-T wave changes  Stress Procedure:  The patient received IV Lexiscan 0.4 mg over 15-seconds.  Technetium 6m Sestamibi injected at 30-seconds. This patient was sob, and dizzy with the Lexiscan injection. Quantitative spect images were obtained after a 45 minute delay. Stress ECG: No significant ST segment change suggestive of ischemia.  QPS Raw Data Images:  Normal; no motion artifact; normal heart/lung ratio. Stress Images:  There is decreased uptake in the anterior wall. Rest Images:  There is decreased uptake in the anterior wall. Subtraction (SDS):  There is a fixed defect that is most consistent with a previous infarction. Transient Ischemic Dilatation (Normal <1.22):  n/a Lung/Heart Ratio (Normal <0.45):  0.41   Quantitative Gated Spect Images QGS EDV:  178 ml QGS ESV:  123 ml  Impression Exercise Capacity:  Lexiscan with no exercise. BP Response:  Normal blood pressure response. Clinical Symptoms:  There is dyspnea. ECG Impression:  No significant ST segment change suggestive of ischemia. 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 Imgaes appear similar to myovue from 2010.  LV Ejection Fraction: EF 31%.  LV Wall Motion:  Anterior apical akinesis   Charlton Haws

## 2013-12-19 ENCOUNTER — Ambulatory Visit: Payer: Medicare Other | Admitting: Physician Assistant

## 2013-12-19 ENCOUNTER — Other Ambulatory Visit: Payer: Self-pay | Admitting: Vascular Surgery

## 2013-12-19 ENCOUNTER — Other Ambulatory Visit: Payer: Self-pay | Admitting: Cardiovascular Disease

## 2013-12-19 DIAGNOSIS — R0989 Other specified symptoms and signs involving the circulatory and respiratory systems: Secondary | ICD-10-CM

## 2013-12-19 DIAGNOSIS — Z48812 Encounter for surgical aftercare following surgery on the circulatory system: Secondary | ICD-10-CM

## 2013-12-19 DIAGNOSIS — I6529 Occlusion and stenosis of unspecified carotid artery: Secondary | ICD-10-CM

## 2014-05-02 ENCOUNTER — Encounter: Payer: Self-pay | Admitting: Family

## 2014-05-02 ENCOUNTER — Other Ambulatory Visit (HOSPITAL_COMMUNITY): Payer: Medicare Other

## 2014-05-02 ENCOUNTER — Ambulatory Visit: Payer: Medicare Other | Admitting: Vascular Surgery

## 2014-05-03 ENCOUNTER — Encounter: Payer: Self-pay | Admitting: Family

## 2014-05-03 ENCOUNTER — Ambulatory Visit (INDEPENDENT_AMBULATORY_CARE_PROVIDER_SITE_OTHER): Payer: Medicare Other | Admitting: Family

## 2014-05-03 ENCOUNTER — Ambulatory Visit (HOSPITAL_COMMUNITY)
Admission: RE | Admit: 2014-05-03 | Discharge: 2014-05-03 | Disposition: A | Discharge status: Home / Self Care | Payer: Medicare Other | Source: Ambulatory Visit | Attending: Family | Admitting: Family

## 2014-05-03 VITALS — BP 98/60 | HR 94 | Resp 16 | Ht 63.0 in | Wt 123.0 lb

## 2014-05-03 DIAGNOSIS — R200 Anesthesia of skin: Secondary | ICD-10-CM

## 2014-05-03 DIAGNOSIS — R209 Unspecified disturbances of skin sensation: Secondary | ICD-10-CM

## 2014-05-03 DIAGNOSIS — Z48812 Encounter for surgical aftercare following surgery on the circulatory system: Secondary | ICD-10-CM | POA: Insufficient documentation

## 2014-05-03 DIAGNOSIS — R479 Unspecified speech disturbances: Secondary | ICD-10-CM

## 2014-05-03 DIAGNOSIS — R0602 Shortness of breath: Secondary | ICD-10-CM

## 2014-05-03 DIAGNOSIS — I6529 Occlusion and stenosis of unspecified carotid artery: Secondary | ICD-10-CM

## 2014-05-03 DIAGNOSIS — R42 Dizziness and giddiness: Secondary | ICD-10-CM

## 2014-05-03 DIAGNOSIS — R4789 Other speech disturbances: Secondary | ICD-10-CM

## 2014-05-03 NOTE — Progress Notes (Signed)
Established Carotid Patient   History of Present Illness  Caroline [name] is a 63 y.o. female patient of Dr. Kellie Simmering who has a known right ICA occlusion and is s/p left carotid endarterectomy performed in March of 2013 for severe left ICA stenosis. She returns today for follow up. She started having dizzy spells months ago, only when she is standing, she fell four weeks ago, hit her nose on a door, states he usually loses consciousness unless she can sit down first. Of note is that her blood pressure is 92/65 and 98/60 today, and she states she has been taking isosorbide for a little over a year. She had an episode about 2 months ago of tingling and irritable feeling in her left arm and leg, denies weakness, denies aphasia, denies monocular vision at that time. This episode lasted a few minutes. She states this is the only such episode she has had. She denies any known history of stroke or TIA. She denies claudication symptoms in her legs with walking, denies non healing wounds.   Pt denies New Medical or Surgical History.  Pt Diabetic: No Pt smoker: smokes 1 pack per week, started at age 48 years, has not had a cigarette in 4 days  Pt meds include: Statin : No, state she thought she had to have some liver testing beyond blood testing to take this, discussed that checking liver enzymes is routine blood testing; she states she will speak with her cardiologist re addressing her elevated cholesterol ASA: Yes Other anticoagulants/antiplatelets: Plavix   Past Medical History  Diagnosis Date  . GERD (gastroesophageal reflux disease)   . Grave's disease   . Dyslipidemia   . CAD (coronary artery disease)     [facility]  Diagonal instent restenosis treated 10/2005  . Myocardial infarction     3 stents 1st 2004, has had several  . Heart murmur     states years ago  . Shortness of breath     with exertion  . Bright disease   . Cancer     right breast 2001-02-24  . Anxiety   . Arthritis      back  . CHF (congestive heart failure)     states she was told se had this in past by MD  . Angina   . Hypertension     states a long time ago-states she never took medication  . Depression     02/24/02 had shock treatment due to depresson, spouse passed away 02-25-00  . Carotid artery occlusion   . Atrial fibrillation     Social History History  Substance Use Topics  . Smoking status: Light Tobacco Smoker -- 0.50 packs/day for 40 years    Types: Cigarettes  . Smokeless tobacco: Never Used  . Alcohol Use: No    Family History Family History  Problem Relation Age of Onset  . Coronary artery disease Father   . Prostate cancer Father     not sure  . Pancreatic cancer Father     not sure  . Cancer Father     Pancreatic and  Prostate  . Heart disease Father     Before age 77 and BPG  . Hyperlipidemia Father   . Heart attack Father   . Hypertension Mother   . Diabetes Sister   . Colon cancer Neg Hx     Surgical History Past Surgical History  Procedure Laterality Date  . Breast lumpectomy      right breast   .  Coronary angioplasty with stent placement    . Joint replacement  2011    Left total hip replacement  . Abdominal hysterectomy      partial  . Mouth surgery    . Endarterectomy  01/08/2012    Procedure: ENDARTERECTOMY CAROTID;  Surgeon: Mal Misty, MD;  Location: [facility];  Service: Vascular;  Laterality: Left;  with Dacron patch angioplasty  . Carotid endarterectomy  01/08/12    Left  . Fracture surgery  2007    Broken Back    Allergies  Allergen Reactions  . Amoxicillin Hives  . Codeine Hives    Current Outpatient Prescriptions  Medication Sig Dispense Refill  . aspirin 325 MG tablet Take 325 mg by mouth daily.      . Black Cohosh 540 MG CAPS Take 540 mg by mouth 2 (two) times daily.      . clonazepam (KLONOPIN) 2 MG disintegrating tablet Take 2 mg by mouth 3 (three) times daily.       . clopidogrel (PLAVIX) 75 MG tablet Take 1 tablet (75 mg total) by mouth  daily.  30 tablet  6  . fish oil-omega-3 fatty acids 1000 MG capsule Take 100 mg by mouth daily.      . isosorbide mononitrate (IMDUR) 30 MG 24 hr tablet Take 1 tablet (30 mg total) by mouth daily.  30 tablet  6  . QUEtiapine (SEROQUEL) 300 MG tablet Take 300 mg by mouth 2 (two) times daily.      . QUEtiapine (SEROQUEL) 400 MG tablet Take 400 mg by mouth at bedtime.       . risperiDONE (RISPERDAL) 2 MG tablet Take 1 mg by mouth 3 (three) times daily.      . fluticasone (FLONASE) 50 MCG/ACT nasal spray Place 2 sprays into the nose daily.  16 g  6  . hydrOXYzine (ATARAX/VISTARIL) 25 MG tablet Take 25 mg by mouth 3 (three) times daily. For anxiety       No current facility-administered medications for this visit.    Review of Systems : See HPI for pertinent positives and negatives.  Physical Examination  Filed Vitals:   05/03/14 1510  BP: 98/60  Pulse: 94  Resp: 16    General: WDWN female in NAD GAIT: slightly spastic Eyes: PERRLA, right eyelid mild to moderate ptosis Pulmonary:  Non-labored, CTAB, Negative  Rales, Negative rhonchi, & Negative wheezing.  Cardiac: regular Rhythm ,  Positive detected murmur.  VASCULAR EXAM Carotid Bruits Left Right   Negative Positive    Aorta is not palpable. Radial pulses are 2+ palpable and equal.                                                                                                              Gastrointestinal: soft, nontender, BS WNL, no r/g,  negative masses.  Musculoskeletal: Negative muscle atrophy/wasting. M/S 4/5 throughout, Extremities without ischemic changes.  Neurologic: A&O X 3; Appropriate Affect ; SENSATION ;normal;  Speech is normal CN 2-12 intact, Pain and light touch intact in  extremities, Motor exam as listed above.   Non-Invasive Vascular Imaging CAROTID DUPLEX 05/03/2014   CEREBROVASCULAR DUPLEX EVALUATION    INDICATION: Carotid stenosis    PREVIOUS INTERVENTION(S): Left carotid endarterectomy 01/08/2012     DUPLEX EXAM:     RIGHT  LEFT  Peak Systolic Velocities (cm/s) End Diastolic Velocities (cm/s) Plaque LOCATION Peak Systolic Velocities (cm/s) End Diastolic Velocities (cm/s) Plaque  84 8  CCA PROXIMAL 92 28   37 6 HT CCA MID 70 26 HT  57 9 HT CCA DISTAL 105 28 HM  85 14 HT ECA 145 25   0 0 occluded ICA PROXIMAL 114 28   0 0 occluded ICA MID 98 36   0 0 occluded ICA DISTAL 120 37     ICA occlusion ICA / CCA Ratio (PSV) carotid endarterectomy  Antegrade Vertebral Flow Antegrade  84 Brachial Systolic Pressure (mmHg) 98  Triphasic Brachial Artery Waveforms Triphasic    Plaque Morphology:  HM = Homogeneous, HT = Heterogeneous, CP = Calcific Plaque, SP = Smooth Plaque, IP = Irregular Plaque     ADDITIONAL FINDINGS:     IMPRESSION: Known Right internal carotid artery occlusion. Left internal carotid artery is patent with history of carotid endarterectomy, minimal hyperplasia present at the proximal patch without hemodynamically significant changes present. Brachial pressure gradient present with triphasic waveforms involving both brachial arteries.    Compared to the previous exam:  Essentially unchanged since previous study on 04/26/2013.    Assessment: Caroline [name] is a 63 y.o. female who has a known right ICA occlusion and is s/p left carotid endarterectomy performed in March of 2013 for severe left ICA stenosis. She is presyncopal and syncopal only when standing, is hypotensive today, states when she feels presyncopal at home and checks her blood pressure it is low; advised to see her cardiologist ASAP re this.  She presents with asymptomatic known right internal carotid artery occlusion. Left internal carotid artery is patent with history of carotid endarterectomy, minimal hyperplasia present at the proximal patch without hemodynamically significant changes present. Brachial pressure gradient present with triphasic waveforms involving both brachial arteries. Essentially  unchanged since previous study on 04/26/2013. Based on today's exam and Duplex results, her syncope is unrelated to her right ICA occlusion since her left ICA is patent and both vertebral arteries are antegrade.   Plan: Based on today's Duplex and exam, and after discussing with Dr. Scot Dock, patient advised to follow-up in 1 year with Carotid Duplex scan.   I discussed in depth with the patient the nature of atherosclerosis, and emphasized the importance of maximal medical management including strict control of blood pressure, blood glucose, and lipid levels, obtaining regular exercise, and continued cessation of smoking.  The patient is aware that without maximal medical management the underlying atherosclerotic disease process will progress, limiting the benefit of any interventions. The patient was given information about stroke prevention and what symptoms should prompt the patient to seek immediate medical care. Thank you for allowing [place] to participate in this patient's care.  Clemon Chambers, RN, MSN, FNP-C [facility] Office: [phone]  Clinic Physician: Scot Dock  05/03/2014 3:16 PM

## 2014-05-03 NOTE — Patient Instructions (Addendum)
Stroke Prevention Some medical conditions and behaviors are associated with an increased chance of having a stroke. You may prevent a stroke by making healthy choices and managing medical conditions. HOW CAN I REDUCE MY RISK OF HAVING A STROKE?   Stay physically active. Get at least 30 minutes of activity on most or all days.  Do not smoke. It may also be helpful to avoid exposure to secondhand smoke.  Limit alcohol use. Moderate alcohol use is considered to be:  No more than 2 drinks per day for men.  No more than 1 drink per day for nonpregnant women.  Eat healthy foods. This involves  Eating 5 or more servings of fruits and vegetables a day.  Following a diet that addresses high blood pressure (hypertension), high cholesterol, diabetes, or obesity.  Manage your cholesterol levels.  A diet low in saturated fat, trans fat, and cholesterol and high in fiber may control cholesterol levels.  Take any prescribed medicines to control cholesterol as directed by your health care provider.  Manage your diabetes.  A controlled-carbohydrate, controlled-sugar diet is recommended to manage diabetes.  Take any prescribed medicines to control diabetes as directed by your health care provider.  Control your hypertension.  A low-salt (sodium), low-saturated fat, low-trans fat, and low-cholesterol diet is recommended to manage hypertension.  Take any prescribed medicines to control hypertension as directed by your health care provider.  Maintain a healthy weight.  A reduced-calorie, low-sodium, low-saturated fat, low-trans fat, low-cholesterol diet is recommended to manage weight.  Stop drug abuse.  Avoid taking birth control pills.  Talk to your health care provider about the risks of taking birth control pills if you are over 35 years old, smoke, get migraines, or have ever had a blood clot.  Get evaluated for sleep disorders (sleep apnea).  Talk to your health care provider about  getting a sleep evaluation if you snore a lot or have excessive sleepiness.  Take medicines as directed by your health care provider.  For some people, aspirin or blood thinners (anticoagulants) are helpful in reducing the risk of forming abnormal blood clots that can lead to stroke. If you have the irregular heart rhythm of atrial fibrillation, you should be on a blood thinner unless there is a good reason you cannot take them.  Understand all your medicine instructions.  Make sure that other other conditions (such as anemia or atherosclerosis) are addressed. SEEK IMMEDIATE MEDICAL CARE IF:   You have sudden weakness or numbness of the face, arm, or leg, especially on one side of the body.  Your face or eyelid droops to one side.  You have sudden confusion.  You have trouble speaking (aphasia) or understanding.  You have sudden trouble seeing in one or both eyes.  You have sudden trouble walking.  You have dizziness.  You have a loss of balance or coordination.  You have a sudden, severe headache with no known cause.  You have new chest pain or an irregular heartbeat. Any of these symptoms may represent a serious problem that is an emergency. Do not wait to see if the symptoms will go away. Get medical help at once. Call your local emergency services  (911 in [place] Do not drive yourself to the hospital. Document Released: 11/27/2004 Document Revised: 08/10/2013 Document Reviewed: 04/22/2013 [employer] Patient Information 2015 [employer]. This information is not intended to replace advice given to you by your health care provider. Make sure you discuss any questions you have with your health   care provider.   Smoking Cessation Quitting smoking is important to your health and has many advantages. However, it is not always easy to quit since nicotine is a very addictive drug. Often times, people try 3 times or more before being able to quit. This document explains the best ways  for you to prepare to quit smoking. Quitting takes hard work and a lot of effort, but you can do it. ADVANTAGES OF QUITTING SMOKING  You will live longer, feel better, and live better.  Your body will feel the impact of quitting smoking almost immediately.  Within 20 minutes, blood pressure decreases. Your pulse returns to its normal level.  After 8 hours, carbon monoxide levels in the blood return to normal. Your oxygen level increases.  After 24 hours, the chance of having a heart attack starts to decrease. Your breath, hair, and body stop smelling like smoke.  After 48 hours, damaged nerve endings begin to recover. Your sense of taste and smell improve.  After 72 hours, the body is virtually free of nicotine. Your bronchial tubes relax and breathing becomes easier.  After 2 to 12 weeks, lungs can hold more air. Exercise becomes easier and circulation improves.  The risk of having a heart attack, stroke, cancer, or lung disease is greatly reduced.  After 1 year, the risk of coronary heart disease is cut in half.  After 5 years, the risk of stroke falls to the same as a nonsmoker.  After 10 years, the risk of lung cancer is cut in half and the risk of other cancers decreases significantly.  After 15 years, the risk of coronary heart disease drops, usually to the level of a nonsmoker.  If you are pregnant, quitting smoking will improve your chances of having a healthy baby.  The people you live with, especially any children, will be healthier.  You will have extra money to spend on things other than cigarettes. QUESTIONS TO THINK ABOUT BEFORE ATTEMPTING TO QUIT You may want to talk about your answers with your caregiver.  Why do you want to quit?  If you tried to quit in the past, what helped and what did not?  What will be the most difficult situations for you after you quit? How will you plan to handle them?  Who can help you through the tough times? Your family? Friends?  A caregiver?  What pleasures do you get from smoking? What ways can you still get pleasure if you quit? Here are some questions to ask your caregiver:  How can you help me to be successful at quitting?  What medicine do you think would be best for me and how should I take it?  What should I do if I need more help?  What is smoking withdrawal like? How can I get information on withdrawal? GET READY  Set a quit date.  Change your environment by getting rid of all cigarettes, ashtrays, matches, and lighters in your home, car, or work. Do not let people smoke in your home.  Review your past attempts to quit. Think about what worked and what did not. GET SUPPORT AND ENCOURAGEMENT You have a better chance of being successful if you have help. You can get support in many ways.  Tell your family, friends, and co-workers that you are going to quit and need their support. Ask them not to smoke around you.  Get individual, group, or telephone counseling and support. Programs are available at [employer] and health centers. Call  your local health department for information about programs in your area.  Spiritual beliefs and practices may help some smokers quit.  Download a "quit meter" on your computer to keep track of quit statistics, such as how long you have gone without smoking, cigarettes not smoked, and money saved.  Get a self-help book about quitting smoking and staying off of tobacco. [place] yourself from urges to smoke. Talk to someone, go for a walk, or occupy your time with a task.  Change your normal routine. Take a different route to work. Drink tea instead of coffee. Eat breakfast in a different place.  Reduce your stress. Take a hot bath, exercise, or read a book.  Plan something enjoyable to do every day. Reward yourself for not smoking.  Explore interactive web-based programs that specialize in helping you quit. GET MEDICINE AND  USE IT CORRECTLY Medicines can help you stop smoking and decrease the urge to smoke. Combining medicine with the above behavioral methods and support can greatly increase your chances of successfully quitting smoking.  Nicotine replacement therapy helps deliver nicotine to your body without the negative effects and risks of smoking. Nicotine replacement therapy includes nicotine gum, lozenges, inhalers, nasal sprays, and skin patches. Some may be available over-the-counter and others require a prescription.  Antidepressant medicine helps people abstain from smoking, but how this works is unknown. This medicine is available by prescription.  Nicotinic receptor partial agonist medicine simulates the effect of nicotine in your brain. This medicine is available by prescription. Ask your caregiver for advice about which medicines to use and how to use them based on your health history. Your caregiver will tell you what side effects to look out for if you choose to be on a medicine or therapy. Carefully read the information on the package. Do not use any other product containing nicotine while using a nicotine replacement product.  RELAPSE OR DIFFICULT SITUATIONS Most relapses occur within the first 3 months after quitting. Do not be discouraged if you start smoking again. Remember, most people try several times before finally quitting. You may have symptoms of withdrawal because your body is used to nicotine. You may crave cigarettes, be irritable, feel very hungry, cough often, get headaches, or have difficulty concentrating. The withdrawal symptoms are only temporary. They are strongest when you first quit, but they will go away within 10-14 days. To reduce the chances of relapse, try to:  Avoid drinking alcohol. Drinking lowers your chances of successfully quitting.  Reduce the amount of caffeine you consume. Once you quit smoking, the amount of caffeine in your body increases and can give you symptoms,  such as a rapid heartbeat, sweating, and anxiety.  Avoid smokers because they can make you want to smoke.  Do not let weight gain distract you. Many smokers will gain weight when they quit, usually less than 10 pounds. Eat a healthy diet and stay active. You can always lose the weight gained after you quit.  Find ways to improve your mood other than smoking. FOR MORE INFORMATION  www.smokefree.gov  Document Released: 10/14/2001 Document Revised: 04/20/2012 Document Reviewed: 01/29/2012 [facility] Patient Information 2015 [place], [place]. This information is not intended to replace advice given to you by your health care provider. Make sure you discuss any questions you have with your health care provider.

## 2014-05-10 ENCOUNTER — Ambulatory Visit (INDEPENDENT_AMBULATORY_CARE_PROVIDER_SITE_OTHER): Payer: Medicare Other | Admitting: Physician Assistant

## 2014-05-10 ENCOUNTER — Ambulatory Visit (HOSPITAL_BASED_OUTPATIENT_CLINIC_OR_DEPARTMENT_OTHER)
Admission: RE | Admit: 2014-05-10 | Discharge: 2014-05-10 | Disposition: A | Discharge status: Home / Self Care | Payer: Medicare Other | Source: Ambulatory Visit | Attending: Physician Assistant | Admitting: Physician Assistant

## 2014-05-10 ENCOUNTER — Encounter: Payer: Self-pay | Admitting: *Deleted

## 2014-05-10 ENCOUNTER — Encounter: Payer: Self-pay | Admitting: Physician Assistant

## 2014-05-10 VITALS — BP 101/64 | HR 104 | Temp 98.6°F | Resp 14 | Ht 63.0 in | Wt 121.8 lb

## 2014-05-10 DIAGNOSIS — M545 Low back pain, unspecified: Secondary | ICD-10-CM

## 2014-05-10 DIAGNOSIS — Z136 Encounter for screening for cardiovascular disorders: Secondary | ICD-10-CM

## 2014-05-10 DIAGNOSIS — G8929 Other chronic pain: Secondary | ICD-10-CM

## 2014-05-10 DIAGNOSIS — R55 Syncope and collapse: Secondary | ICD-10-CM

## 2014-05-10 DIAGNOSIS — R42 Dizziness and giddiness: Secondary | ICD-10-CM

## 2014-05-10 DIAGNOSIS — IMO0002 Reserved for concepts with insufficient information to code with codable children: Secondary | ICD-10-CM | POA: Insufficient documentation

## 2014-05-10 LAB — CBC WITH DIFFERENTIAL/PLATELET
BASOS ABS: 0.1 10*3/uL (ref 0.0–0.1)
Basophils Relative: 1 % (ref 0–1)
EOS PCT: 1 % (ref 0–5)
Eosinophils Absolute: 0.1 10*3/uL (ref 0.0–0.7)
HEMATOCRIT: 33.2 % — AB (ref 36.0–46.0)
Hemoglobin: 11.5 g/dL — ABNORMAL LOW (ref 12.0–15.0)
LYMPHS ABS: 1.9 10*3/uL (ref 0.7–4.0)
LYMPHS PCT: 28 % (ref 12–46)
MCH: 28.3 pg (ref 26.0–34.0)
MCHC: 34.6 g/dL (ref 30.0–36.0)
MCV: 81.8 fL (ref 78.0–100.0)
Monocytes Absolute: 0.5 10*3/uL (ref 0.1–1.0)
Monocytes Relative: 8 % (ref 3–12)
Neutro Abs: 4.2 10*3/uL (ref 1.7–7.7)
Neutrophils Relative %: 62 % (ref 43–77)
Platelets: 305 10*3/uL (ref 150–400)
RBC: 4.06 MIL/uL (ref 3.87–5.11)
RDW: 16.5 % — AB (ref 11.5–15.5)
WBC: 6.8 10*3/uL (ref 4.0–10.5)

## 2014-05-10 LAB — COMPREHENSIVE METABOLIC PANEL
ALT: <8 U/L (ref 0–35)
AST: 12 U/L (ref 0–37)
Albumin: 4.4 g/dL (ref 3.5–5.2)
Alkaline Phosphatase: 65 U/L (ref 39–117)
BUN: 28 mg/dL — AB (ref 6–23)
CALCIUM: 9.8 mg/dL (ref 8.4–10.5)
CHLORIDE: 100 meq/L (ref 96–112)
CO2: 23 meq/L (ref 19–32)
CREATININE: 2.35 mg/dL — AB (ref 0.50–1.10)
Glucose, Bld: 94 mg/dL (ref 70–99)
Potassium: 4.7 mEq/L (ref 3.5–5.3)
Sodium: 135 mEq/L (ref 135–145)
Total Bilirubin: 0.3 mg/dL (ref 0.2–1.2)
Total Protein: 7.1 g/dL (ref 6.0–8.3)

## 2014-05-10 LAB — TSH: TSH: 3.689 u[IU]/mL (ref 0.350–4.500)

## 2014-05-10 NOTE — Progress Notes (Signed)
Patient presents to clinic today to transfer care from Dr. Elizebeth Koller who is no longer in practice.  Patient was last seen in this office by Dr. Elizebeth Koller on 04/23/2012.    Acute Concerns: Patient complains of lightheaded/dizzy spells resulting in syncopal episodes that have been present for the past several months.  Last episode was around 1 month ago.  Patient endorses feeling the room spinning while resting at her home.  Patient denies syncopal episode.  Denies nausea or vomiting..  Patient has been seen previously this month for the same issue by her Vascular specialist.  Was felt to be unrelated to her history of right ICA occlusion.  Was told to follow-up with her Cardiologist.  Patient states she has not followed up with her Cardiologist.  Patient also with history of dysthymia.  Is followed by Psychiatry.  Is currently on Seroquel 300 mg at breakfast and lunch.  Is taking 400 mg Seroquel at bedtime.  Patient also on Risperidone and Clonazepam daily.  Patient denies other new medication or recent changes to medication regimen.  Patient denies other significant PMH.  Denies trauma or injury.  Denies having imaging of her head to assess for cause of dizziness.  Patient is on Imdur for HTN.  BP in clinic today is 82/28.  Patient states she has not taken her BP medication in several weeks.  Endorses she only drinks 1 glass of water per day.  Does not eat consistently according to her daughter who is present in the room at time of interview.  Patient also c/o chronic lower back pain that has been present since an accident many years ago.  States she possibly broke her vertebra but never followed up with her orthopedist.  Denies decreased ROM. Just endorses throbbing, non-radiating low back pain occasionally.  Has not taken anything for her symptoms.  Is requesting narcotic pain medication.  Past Medical History  Diagnosis Date  . GERD (gastroesophageal reflux disease)   . Grave's disease   . Dyslipidemia   .  CAD (coronary artery disease)     [facility]  Diagonal instent restenosis treated 10/2005  . Myocardial infarction     3 stents 1st 2004, has had several  . Heart murmur     states years ago  . Shortness of breath     with exertion  . Bright disease   . Cancer     right breast 16-Feb-2001  . Anxiety   . Arthritis     back  . CHF (congestive heart failure)     states she was told se had this in past by MD  . Angina   . Hypertension     states a long time ago-states she never took medication  . Depression     16-Feb-2002 had shock treatment due to depresson, spouse passed away 17-Feb-2000  . Carotid artery occlusion   . Atrial fibrillation    Past Surgical History  Procedure Laterality Date  . Breast lumpectomy      right breast   . Coronary angioplasty with stent placement    . Joint replacement  02/16/2010    Left total hip replacement  . Abdominal hysterectomy      partial  . Mouth surgery    . Endarterectomy  01/08/2012    Procedure: ENDARTERECTOMY CAROTID;  Surgeon: Mal Misty, MD;  Location: [facility];  Service: Vascular;  Laterality: Left;  with Dacron patch angioplasty  . Carotid endarterectomy  01/08/12    Left  .  Fracture surgery  2007    Broken Back    Current Outpatient Prescriptions on File Prior to Visit  Medication Sig Dispense Refill  . aspirin 325 MG tablet Take 325 mg by mouth daily.      . Black Cohosh 540 MG CAPS Take 540 mg by mouth 2 (two) times daily.      . clonazepam (KLONOPIN) 2 MG disintegrating tablet Take 2 mg by mouth 3 (three) times daily.       . clopidogrel (PLAVIX) 75 MG tablet Take 1 tablet (75 mg total) by mouth daily.  30 tablet  6  . fish oil-omega-3 fatty acids 1000 MG capsule Take 100 mg by mouth daily.      . QUEtiapine (SEROQUEL) 300 MG tablet Take 300 mg by mouth 2 (two) times daily.      . QUEtiapine (SEROQUEL) 400 MG tablet Take 400 mg by mouth at bedtime.       . risperiDONE (RISPERDAL) 2 MG tablet Take 1 mg by mouth 3 (three) times daily.       No  current facility-administered medications on file prior to visit.    Allergies  Allergen Reactions  . Amoxicillin Hives  . Codeine Hives    Family History  Problem Relation Age of Onset  . Coronary artery disease Father   . Prostate cancer Father     not sure  . Pancreatic cancer Father     not sure  . Cancer Father     Pancreatic and  Prostate  . Heart disease Father     Before age 30 and BPG  . Hyperlipidemia Father   . Heart attack Father   . Hypertension Mother   . Diabetes Sister   . Colon cancer Neg Hx     History   Social History  . Marital Status: Widowed    Spouse Name: N/A    Number of Children: N/A  . Years of Education: N/A   Occupational History  . Not on file.   Social History Main Topics  . Smoking status: Light Tobacco Smoker -- 0.50 packs/day for 40 years    Types: Cigarettes  . Smokeless tobacco: Never Used  . Alcohol Use: No  . Drug Use: No  . Sexual Activity: Not on file   Other Topics Concern  . Not on file   Social History Narrative   She has 1 child.  She is on disability.   ROS See HPI.  All other ROS are negative.  BP 101/64  Pulse 104  Temp(Src) 98.6 F (37 C) (Oral)  Resp 14  Ht 5\' 3"  (1.6 m)  Wt 121 lb 12 oz (55.225 kg)  BMI 21.57 kg/m2  SpO2 100%  Physical Exam  Vitals reviewed. Constitutional: She is oriented to person, place, and time and well-developed, well-nourished, and in no distress.  HENT:  Head: Normocephalic and atraumatic.  Right Ear: External ear normal.  Left Ear: External ear normal.  Nose: Nose normal.  Mouth/Throat: Oropharynx is clear and moist. No oropharyngeal exudate.  TM within normal limits.  Eyes: Conjunctivae are normal. Pupils are equal, round, and reactive to light.  Neck: Neck supple.  Cardiovascular: Normal rate, regular rhythm, normal heart sounds and intact distal pulses.   Pulmonary/Chest: Effort normal and breath sounds normal. No respiratory distress. She has no wheezes. She  has no rales. She exhibits no tenderness.  Abdominal: Soft. Bowel sounds are normal. She exhibits no distension and no mass. There is no tenderness. There is  no rebound and no guarding.  Neurological: She is alert and oriented to person, place, and time. She has normal reflexes. No cranial nerve deficit.  Skin: Skin is warm and dry.   Assessment/Plan: Pre-syncope Possibly stemming from low blood pressure.  Patient is not getting enough fluid intake or nutrients.  Increase fluids.  Eat small meal or snack every 3 hours.  EKG obtained and shows RAE and signs of old infarction.  EKG is unchanged when compared to previous EKG.  Patient reminded to schedule follow-up with Cardiology.  Concern that Risperdal may be causing Orthostasis and contributing to her lightheadedness and pre-syncopal episodes.  Patient refuses to believe medication could be causing this.  Recommend she speak with her psychiatrist concerning this medication.  Will obtain labs and CT head.  Will place referral to neurology pending results.  Encouraged compression stockings.  Chronic low back pain Will obtain imaging.  Recommend rest, Aspercreme and OTC pain medications.  Patient seems very heavily drugged with her extensive psychaitric regimen.  Given this and her symptoms, I will not prescribe a narcotic pain medication until we find a cause and resolve her presyncopal episodes.

## 2014-05-10 NOTE — Patient Instructions (Signed)
Please obtain labs. I will call you with your results. Please call Dr. Kyla Balzarine office to get an earlier appointment.  I would hold off on the Imdur for now as your BP is really low.  Risperdal can be the cause of your symptoms.  I recommend you speak with your psychiatrist about stopping this medication and finding an alternative.  Please wear compression stockings daily.  You will be contacted for an echocardiogram, a CT of your head and an appointment with Neurology.  For back pain, please try tylenol for symptom relief.  Apply a topical Aspercreme to the area.  Please consider allowing [place] to get an x-ray.

## 2014-05-10 NOTE — Progress Notes (Signed)
Pre visit review using our clinic review tool, if applicable. No additional management support is needed unless otherwise documented below in the visit note/SLS  

## 2014-05-17 ENCOUNTER — Ambulatory Visit (HOSPITAL_BASED_OUTPATIENT_CLINIC_OR_DEPARTMENT_OTHER)
Admission: RE | Admit: 2014-05-17 | Discharge: 2014-05-17 | Disposition: A | Discharge status: Home / Self Care | Payer: Medicare Other | Source: Ambulatory Visit | Attending: Physician Assistant | Admitting: Physician Assistant

## 2014-05-17 ENCOUNTER — Other Ambulatory Visit (HOSPITAL_BASED_OUTPATIENT_CLINIC_OR_DEPARTMENT_OTHER): Payer: Medicare Other

## 2014-05-17 ENCOUNTER — Ambulatory Visit (HOSPITAL_BASED_OUTPATIENT_CLINIC_OR_DEPARTMENT_OTHER): Payer: Medicare Other

## 2014-05-17 DIAGNOSIS — R55 Syncope and collapse: Secondary | ICD-10-CM

## 2014-05-17 DIAGNOSIS — I2510 Atherosclerotic heart disease of native coronary artery without angina pectoris: Secondary | ICD-10-CM | POA: Insufficient documentation

## 2014-05-17 DIAGNOSIS — R0602 Shortness of breath: Secondary | ICD-10-CM | POA: Insufficient documentation

## 2014-05-17 DIAGNOSIS — I6789 Other cerebrovascular disease: Secondary | ICD-10-CM | POA: Insufficient documentation

## 2014-05-17 DIAGNOSIS — I252 Old myocardial infarction: Secondary | ICD-10-CM | POA: Insufficient documentation

## 2014-05-17 DIAGNOSIS — I059 Rheumatic mitral valve disease, unspecified: Secondary | ICD-10-CM | POA: Insufficient documentation

## 2014-05-17 DIAGNOSIS — I359 Nonrheumatic aortic valve disorder, unspecified: Secondary | ICD-10-CM

## 2014-05-17 DIAGNOSIS — R42 Dizziness and giddiness: Secondary | ICD-10-CM | POA: Insufficient documentation

## 2014-05-17 DIAGNOSIS — F17200 Nicotine dependence, unspecified, uncomplicated: Secondary | ICD-10-CM | POA: Insufficient documentation

## 2014-05-17 DIAGNOSIS — G319 Degenerative disease of nervous system, unspecified: Secondary | ICD-10-CM | POA: Insufficient documentation

## 2014-05-17 DIAGNOSIS — I10 Essential (primary) hypertension: Secondary | ICD-10-CM | POA: Insufficient documentation

## 2014-05-18 ENCOUNTER — Telehealth: Payer: Self-pay | Admitting: *Deleted

## 2014-05-18 DIAGNOSIS — N289 Disorder of kidney and ureter, unspecified: Secondary | ICD-10-CM

## 2014-05-18 DIAGNOSIS — N184 Chronic kidney disease, stage 4 (severe): Secondary | ICD-10-CM

## 2014-05-18 NOTE — Telephone Encounter (Signed)
Message copied by Rockwell Germany on Thu May 18, 2014  2:59 PM ------      Message from: Raiford Noble      Created: Thu May 11, 2014 10:05 AM       X-ray reveals significant arthritis in the lower back. Also shows some slippage of the discs.  No evidence of fracture.  Still will need to hold off on narcotic pain medication until we get her dizziness assessed.  Her labs are also in.  She is mildly anemia.  She needs to increase her fluid intake and increase intake of iron-rich foods.  Her kidney function is declined below her baseline.  Again she should stay well hydrated.  I need her to come in in 1 week for repeat BMP with GFR and to reassess her for causes of worsening kidney function.  I am still waiting on her Echocardiogram and CT results.  I will call her when I have those.  I am also going to speak with my supervising physician regarding her case.  She should continue care as discussed at yesterday's visit.  See if she has an appointment yet with Dr. Johnsie Cancel. ------

## 2014-05-18 NOTE — Telephone Encounter (Signed)
Patient informed, understood & agreed but states she cannot return for labs until one day next week; reiterated the importance of getting these done to help determine cause[s] for declining kidney funtion. Also, pt reports that Cardiology gave her an appointment for 09.02.15; upon her Echocardiogram final report from 07.15.15, provider feels that pt needs Urgent visit and information given to [facility], Anderson [place], to call Cardiology and get closest available appt. [pt needs after 12pm]/SLS New lab order placed.

## 2014-05-19 ENCOUNTER — Ambulatory Visit (INDEPENDENT_AMBULATORY_CARE_PROVIDER_SITE_OTHER): Payer: Medicare Other | Admitting: Physician Assistant

## 2014-05-19 ENCOUNTER — Other Ambulatory Visit: Payer: Self-pay | Admitting: Cardiovascular Disease

## 2014-05-19 VITALS — BP 110/64 | HR 104

## 2014-05-19 DIAGNOSIS — Z0289 Encounter for other administrative examinations: Secondary | ICD-10-CM

## 2014-05-19 DIAGNOSIS — Z136 Encounter for screening for cardiovascular disorders: Secondary | ICD-10-CM

## 2014-05-19 NOTE — Progress Notes (Signed)
Pt presented to the lab for blood draw. Phlebotomist was concerned as pt reports she became dizzy downstairs and was brought up in a wheelchair. Reports that Provider is running tests and doing workup re: her dizziness episodes. BP normal today. Per verbal from Provider, ok to d/c pt and will contact her with results.

## 2014-05-20 LAB — BASIC METABOLIC PANEL WITH GFR
BUN: 24 mg/dL — ABNORMAL HIGH (ref 6–23)
CHLORIDE: 101 meq/L (ref 96–112)
CO2: 22 mEq/L (ref 19–32)
CREATININE: 2.11 mg/dL — AB (ref 0.50–1.10)
Calcium: 9 mg/dL (ref 8.4–10.5)
GFR, Est African American: 28 mL/min — ABNORMAL LOW
GFR, Est Non African American: 25 mL/min — ABNORMAL LOW
GLUCOSE: 109 mg/dL — AB (ref 70–99)
POTASSIUM: 4.4 meq/L (ref 3.5–5.3)
Sodium: 133 mEq/L — ABNORMAL LOW (ref 135–145)

## 2014-05-21 DIAGNOSIS — R55 Syncope and collapse: Secondary | ICD-10-CM | POA: Insufficient documentation

## 2014-05-21 DIAGNOSIS — M545 Low back pain: Secondary | ICD-10-CM

## 2014-05-21 DIAGNOSIS — G8929 Other chronic pain: Secondary | ICD-10-CM | POA: Insufficient documentation

## 2014-05-21 NOTE — Assessment & Plan Note (Signed)
Will obtain imaging.  Recommend rest, Aspercreme and OTC pain medications.  Patient seems very heavily drugged with her extensive psychaitric regimen.  Given this and her symptoms, I will not prescribe a narcotic pain medication until we find a cause and resolve her presyncopal episodes.

## 2014-05-21 NOTE — Assessment & Plan Note (Signed)
Possibly stemming from low blood pressure.  Patient is not getting enough fluid intake or nutrients.  Increase fluids.  Eat small meal or snack every 3 hours.  EKG obtained and shows RAE and signs of old infarction.  EKG is unchanged when compared to previous EKG.  Patient reminded to schedule follow-up with Cardiology.  Concern that Risperdal may be causing Orthostasis and contributing to her lightheadedness and pre-syncopal episodes.  Patient refuses to believe medication could be causing this.  Recommend she speak with her psychiatrist concerning this medication.  Will obtain labs and CT head.  Will place referral to neurology pending results.  Encouraged compression stockings.

## 2014-05-22 NOTE — Telephone Encounter (Signed)
Spoke with patient concerning lab results and echocardiogram findings.  Patient has rescheduled her Cardiology appointment out to August after we worked very hard to get her an earlier appointment.  Patient has been noncompliant with recommendations thus far.  She is to follow-up in office in 1 week.

## 2014-05-22 NOTE — Telephone Encounter (Signed)
Patient left message requesting call back in regards to her labs, she has several questtions

## 2014-05-22 NOTE — Telephone Encounter (Signed)
Please Advise

## 2014-05-22 NOTE — Telephone Encounter (Signed)
Kidney function reveals Stage IV Chronic Kidney disease.  Her echocardiogram reveals significant systolic heart failure.  It is very important that she follow-up with Cardiology as scheduled.  I am also setting her up with an urgent referral to nephrology.  I usually would start certain medications for this conditions, but with the patient's BP being low without taking her IMDUR, I am hesitant to start these medications as they would lower her blood pressure further.  I think the specialists would find the best regimen for her.  She will be contacted by Nephrology.  I highly recommend she go to the appointment that they set up for her.

## 2014-05-22 NOTE — Telephone Encounter (Signed)
Patient informed, having hard time understanding/

## 2014-05-24 ENCOUNTER — Ambulatory Visit (INDEPENDENT_AMBULATORY_CARE_PROVIDER_SITE_OTHER): Payer: Medicare Other | Admitting: Neurology

## 2014-05-24 ENCOUNTER — Encounter: Payer: Self-pay | Admitting: Neurology

## 2014-05-24 VITALS — BP 95/58 | HR 104 | Ht 63.0 in | Wt 125.7 lb

## 2014-05-24 DIAGNOSIS — R42 Dizziness and giddiness: Secondary | ICD-10-CM

## 2014-05-24 NOTE — Patient Instructions (Signed)
1. It is very important to see the Cardiologist 2. Wear compression stockings daily

## 2014-05-24 NOTE — Progress Notes (Signed)
NEUROLOGY CONSULTATION NOTE  Caroline [name] MRN: [id number] DOB: 05-07-1951  Referring provider: [name], PA-C Primary care provider: [name], PA-C  Reason for consult:  dizziness  Thank you for your kind referral of Caroline [name] for consultation of the above symptoms. Although her history is well known to you, please allow me to reiterate it for the purpose of our medical record. Records and images were personally reviewed where available.  HISTORY OF PRESENT ILLNESS: This is a 63 year old left-handed woman with a history of hypertension, peripheral artery disease with left ICA stenosis s/p CEA and right carotid occlusion followed by vascular surgery, presenting for evaluation of dizziness.  She reports dizziness started a few months ago. Dizziness occurs upon standing, lasting around 10 minutes, described as a spinning sensation.  She does not have any dizzy spells when sitting or lying down.  She had a dizzy spell this morning and BP was 95/59.  There is no associated nausea, vomiting, focal numbness/tingling/weakness.  No ear pain/fullness, tinnitus, or headaches.  She would fall down then quickly come to.  She denies any neck pain, diplopia, dysarthria, dysphagia, bowel/bladder dysfunction. She has chronic back pain.  She is on high dose Seroquel, taking Seroquel XR 300mg  BID, and an addition 400mg  qhs.  She has been taking clonazepam TID for many years, reduced to BID dosing last month.  She takes Risperdal TID.  She feels some shortness of breath when supine, endorses occasional palpitations. She denies any chest pain.    I personally reviewed head CT without contrast done 05/10/14 which is unremarkable. She had an echocardiogram on 05/17/14 which showed systolic function was moderately to severely reduced. The estimated ejection fraction was in the range of 30% to 35%. Regional wall motion abnormalities: Dyskinesis of the basal-midanteroseptal myocardium. Severe  hypokinesis of the basal-midinferior myocardium.  When told of these results today, she tells me she does not know about this, although this is noted as a telephone note with her PCP.  I have asked her to call her PCP and see Cardiology asap.   Laboratory Data: Lab Results  Component Value Date   WBC 6.8 05/10/2014   HGB 11.5* 05/10/2014   HCT 33.2* 05/10/2014   MCV 81.8 05/10/2014   PLT 305 05/10/2014     Chemistry      Component Value Date/Time   NA 133* 05/18/2014 1429   K 4.4 05/18/2014 1429   CL 101 05/18/2014 1429   CO2 22 05/18/2014 1429   BUN 24* 05/18/2014 1429   CREATININE 2.11* 05/18/2014 1429   CREATININE 1.40* 04/17/2012 1440      Component Value Date/Time   CALCIUM 9.0 05/18/2014 1429   ALKPHOS 65 05/10/2014 1604   AST 12 05/10/2014 1604   ALT <8 05/10/2014 1604   BILITOT 0.3 05/10/2014 1604     Lab Results  Component Value Date   TSH 3.689 05/10/2014     PAST MEDICAL HISTORY: Past Medical History  Diagnosis Date  . GERD (gastroesophageal reflux disease)   . Grave's disease   . Dyslipidemia   . CAD (coronary artery disease)     [facility]  Diagonal instent restenosis treated 10/2005  . Myocardial infarction     3 stents 1st 2004, has had several  . Heart murmur     states years ago  . Shortness of breath     with exertion  . Bright disease   . Cancer     right  breast 2001-02-17  . Anxiety   . Arthritis     back  . CHF (congestive heart failure)     states she was told se had this in past by MD  . Angina   . Hypertension     states a long time ago-states she never took medication  . Depression     02-17-2002 had shock treatment due to depresson, spouse passed away 02/18/00  . Carotid artery occlusion   . Atrial fibrillation     PAST SURGICAL HISTORY: Past Surgical History  Procedure Laterality Date  . Breast lumpectomy      right breast   . Coronary angioplasty with stent placement    . Joint replacement  17-Feb-2010    Left total hip replacement  . Abdominal hysterectomy        partial  . Mouth surgery    . Endarterectomy  01/08/2012    Procedure: ENDARTERECTOMY CAROTID;  Surgeon: Mal Misty, MD;  Location: [facility];  Service: Vascular;  Laterality: Left;  with Dacron patch angioplasty  . Carotid endarterectomy  01/08/12    Left  . Fracture surgery  02-17-06    Broken Back    MEDICATIONS: Current Outpatient Prescriptions on File Prior to Visit  Medication Sig Dispense Refill  . aspirin 325 MG tablet Take 325 mg by mouth daily.      . Black Cohosh 540 MG CAPS Take 540 mg by mouth 2 (two) times daily.      . clonazepam (KLONOPIN) 2 MG disintegrating tablet Take 2 mg by mouth 3 (three) times daily.       . clopidogrel (PLAVIX) 75 MG tablet Take 1 tablet (75 mg total) by mouth daily.  30 tablet  6  . fish oil-omega-3 fatty acids 1000 MG capsule Take 100 mg by mouth daily.      . isosorbide mononitrate (IMDUR) 30 MG 24 hr tablet TAKE 1 TABLET BY MOUTH DAILY.  30 tablet  0  . QUEtiapine (SEROQUEL) 300 MG tablet Take 300 mg by mouth 2 (two) times daily.      . QUEtiapine (SEROQUEL) 400 MG tablet Take 400 mg by mouth at bedtime.       . risperiDONE (RISPERDAL) 2 MG tablet Take 1 mg by mouth 3 (three) times daily.       No current facility-administered medications on file prior to visit.    ALLERGIES: Allergies  Allergen Reactions  . Amoxicillin Hives  . Codeine Hives    FAMILY HISTORY: Family History  Problem Relation Age of Onset  . Coronary artery disease Father   . Prostate cancer Father     not sure  . Pancreatic cancer Father     not sure  . Cancer Father     Pancreatic and  Prostate  . Heart disease Father     Before age 59 and BPG  . Hyperlipidemia Father   . Heart attack Father   . Hypertension Mother   . Diabetes Sister   . Colon cancer Neg Hx     SOCIAL HISTORY: History   Social History  . Marital Status: Widowed    Spouse Name: N/A    Number of Children: N/A  . Years of Education: N/A   Occupational History  . Not on file.    Social History Main Topics  . Smoking status: Light Tobacco Smoker -- 0.50 packs/day for 40 years    Types: Cigarettes  . Smokeless tobacco: Never Used  . Alcohol Use: No  .  Drug Use: No  . Sexual Activity: Not on file   Other Topics Concern  . Not on file   Social History Narrative   She has 1 child.  She is on disability.    REVIEW OF SYSTEMS: Constitutional: No fevers, chills, or sweats, no generalized fatigue, change in appetite Eyes: No visual changes, double vision, eye pain Ear, nose and throat: No hearing loss, ear pain, nasal congestion, sore throat Cardiovascular: No chest pain, occl palpitations Respiratory:  No shortness of breath when supine, no wheezes GastrointestinaI: No nausea, vomiting, diarrhea, abdominal pain, fecal incontinence Genitourinary:  No dysuria, urinary retention or frequency Musculoskeletal:  No neck pain, +back pain Integumentary: No rash, pruritus, skin lesions Neurological: as above Psychiatric: No depression, insomnia, anxiety Endocrine: No palpitations, fatigue, diaphoresis, mood swings, change in appetite, change in weight, increased thirst Hematologic/Lymphatic:  No anemia, purpura, petechiae. Allergic/Immunologic: no itchy/runny eyes, nasal congestion, recent allergic reactions, rashes  PHYSICAL EXAM: Filed Vitals:   05/24/14 1408  BP: 95/58  Pulse: 104   General: No acute distress, flat affect Head:  Normocephalic/atraumatic Eyes: Fundoscopic exam shows bilateral sharp discs, no vessel changes, exudates, or hemorrhages Neck: supple, no paraspinal tenderness, full range of motion Back: No paraspinal tenderness Heart: regular rate and rhythm Lungs: Clear to auscultation bilaterally. Vascular: No carotid bruits. Skin/Extremities: No rash, no edema Neurological Exam: Mental status: alert and oriented to person, place, and time, no dysarthria or aphasia, Fund of knowledge is appropriate.  Recent and remote memory are intact.   Attention and concentration are normal.    Able to name objects and repeat phrases. Cranial nerves: CN I: not tested CN II: pupils equal, round and reactive to light, visual fields intact, fundi unremarkable. CN III, IV, VI:  full range of motion, no nystagmus, no ptosis CN V: facial sensation intact CN VII: upper and lower face symmetric CN VIII: hearing intact to finger rub CN IX, X: gag intact, uvula midline CN XI: sternocleidomastoid and trapezius muscles intact CN XII: tongue midline Bulk & Tone: normal, no fasciculations. Motor: 5/5 throughout with no pronator drift. Sensation: intact to light touch, cold, pin, vibration and joint position sense.  No extinction to double simultaneous stimulation.  Romberg test negative Deep Tendon Reflexes: +2 throughout, no ankle clonus Plantar responses: downgoing bilaterally Cerebellar: no incoordination on finger to nose, heel to shin. No dysdiadochokinesia Gait: narrow-based and steady, able to tandem walk adequately. Tremor: none  IMPRESSION: This is a 63 year old left-handed woman with a history of CAD s/p angioplasty, known right carotid occlusion, left ICA stenosis s/p CEA, with several months of dizziness, presyncope/syncope.  She is noted to have low BP today, which was noted on previous visits with her other physicians as well.  Her neurological exam is normal, no cerebellar signs, head CT unremarkable.  Symptoms likely due to hypotension, her echo is abnormal and I have asked her to see her cardiologist ASAP and speak with her PCP who has discussed this with her but it appears she did not understand/does not recall hearing about the echo results. She is on high doses of Seroquel and Risperdal which can also cause orthostatic hypotension. She should follow-up with her psychiatrist to discuss medication regimen. She knows to call our office for any change in symptoms and will follow-up on a prn basis.  Thank you for allowing me to participate  in the care of this patient. Please do not hesitate to call for any questions or concerns.   Ellouise Newer,  M.D.

## 2014-05-25 ENCOUNTER — Encounter: Payer: Self-pay | Admitting: Neurology

## 2014-05-31 ENCOUNTER — Ambulatory Visit: Payer: Medicare Other | Admitting: Physician Assistant

## 2014-06-13 ENCOUNTER — Encounter: Payer: Self-pay | Admitting: Physician Assistant

## 2014-06-13 ENCOUNTER — Ambulatory Visit (INDEPENDENT_AMBULATORY_CARE_PROVIDER_SITE_OTHER): Payer: Medicare Other | Admitting: Physician Assistant

## 2014-06-13 VITALS — BP 96/63 | HR 95 | Ht 63.0 in | Wt 125.0 lb

## 2014-06-13 DIAGNOSIS — I5022 Chronic systolic (congestive) heart failure: Secondary | ICD-10-CM

## 2014-06-13 DIAGNOSIS — I951 Orthostatic hypotension: Secondary | ICD-10-CM

## 2014-06-13 DIAGNOSIS — I2589 Other forms of chronic ischemic heart disease: Secondary | ICD-10-CM

## 2014-06-13 DIAGNOSIS — I255 Ischemic cardiomyopathy: Secondary | ICD-10-CM

## 2014-06-13 DIAGNOSIS — R55 Syncope and collapse: Secondary | ICD-10-CM

## 2014-06-13 DIAGNOSIS — I6529 Occlusion and stenosis of unspecified carotid artery: Secondary | ICD-10-CM

## 2014-06-13 DIAGNOSIS — I2510 Atherosclerotic heart disease of native coronary artery without angina pectoris: Secondary | ICD-10-CM

## 2014-06-13 NOTE — Progress Notes (Signed)
Cardiology Office Note    Date:  06/13/2014   ID:  DELENN AHN, DOB 1950/12/02, MRN [id number]  PCP:  [name], PA-C  Cardiologist:  Dr. Jenkins Rouge   Nephrologist:  Dr. Justin Mend   History of Present Illness: Caroline [name] is a 63 y.o. 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  She presents today for the evaluation of dizziness, near syncope and syncope.  This has been ongoing for several mos.  Last episode of syncope was several mos ago.  This typically occurs with standing.  However, she has had near syncope while sitting at rest.  She is fairly sedentary.  She notes some shortness of breath.  She denies chest pain.  She denies true orthopnea, PND.  She denies LE edema.  She has seen neurology.  There was no neurogenic cause found.  FU echo was done recently with EF 30-35%.  She is referred back for evaluation.   Studies:  - LHC (6/10):  Mid Diag 90% ISR, prox RCA 20%, mid RCA 40%, EF 35% global HK with apical AK >>> PCI:  Cutting balloon POBA of Diag ISR  - Echo (5/10):  EF 40%  - Echo (7/15):  EF 30-35%, AS dyskinesis, inf HK, mild AI, mild MR  - Nuclear (10/14):  Lg anterior, apical and inf-apical infarct, no ischemia, EF 31% - similar to study from 2010; Intermediate Risk  >>> Med Rx  - Carotid US (7/15):  RICA occluded, L CEA patent   Recent Labs/Images: 05/10/2014: ALT <8; Hemoglobin 11.5*; TSH 3.689  05/18/2014: Creatinine 2.11*; Potassium 4.4   Ct Head Wo Contrast  05/17/2014   CLINICAL DATA:  Recurrent dizziness for 2 months, several false  EXAM: CT HEAD WITHOUT CONTRAST  TECHNIQUE: Contiguous axial images were obtained from the base of the skull through the vertex without intravenous contrast.  COMPARISON:  CT brain scan of 04/17/2012  FINDINGS: The ventricular system remains prominent as are the  cortical sulci and cerebellar folia, consistent with diffuse atrophy. The septum remains in a midline position. Moderate small vessel ischemic change is again noted throughout the periventricular white matter. No hemorrhage, mass lesion, or acute infarction is seen. On bone window images, no calvarial abnormality is seen. The paranasal sinuses that are visualized are clear.  IMPRESSION: Atrophy and moderate small vessel ischemic change. No acute intracranial abnormality.   Electronically Signed   By: Ivar Drape M.D.   On: 05/17/2014 13:20     Wt Readings from Last 3 Encounters:  05/24/14 125 lb 11.2 oz (57.017 kg)  05/10/14 121 lb 12 oz (55.225 kg)  05/03/14 123 lb (55.792 kg)     Past Medical History  Diagnosis Date  . GERD (gastroesophageal reflux disease)   . Grave's disease   . Dyslipidemia   . CAD (coronary artery disease)     [facility]  Diagonal instent restenosis treated 10/2005  . Myocardial infarction     3 stents 1st 2004, has had several  . Heart murmur     states years ago  . Shortness of breath     with exertion  . Bright disease   . Cancer     right breast 2002  . Anxiety   . Arthritis     back  . CHF (congestive heart failure)  states she was told se had this in past by MD  . Angina   . Hypertension     states a long time ago-states she never took medication  . Depression     02/12/2002 had shock treatment due to depresson, spouse passed away 2000/02/13  . Carotid artery occlusion   . Atrial fibrillation     Current Outpatient Prescriptions  Medication Sig Dispense Refill  . aspirin 325 MG tablet Take 325 mg by mouth daily.      . Black Cohosh 540 MG CAPS Take 540 mg by mouth 2 (two) times daily.      . clonazepam (KLONOPIN) 2 MG disintegrating tablet Take 2 mg by mouth 3 (three) times daily.       . clopidogrel (PLAVIX) 75 MG tablet Take 1 tablet (75 mg total) by mouth daily.  30 tablet  6  . fish oil-omega-3 fatty acids 1000 MG capsule Take 100 mg by mouth  daily.      . isosorbide mononitrate (IMDUR) 30 MG 24 hr tablet TAKE 1 TABLET BY MOUTH DAILY.  30 tablet  0  . QUEtiapine (SEROQUEL) 300 MG tablet Take 300 mg by mouth 2 (two) times daily.      . QUEtiapine (SEROQUEL) 400 MG tablet Take 400 mg by mouth at bedtime.       . risperiDONE (RISPERDAL) 2 MG tablet Take 1 mg by mouth 3 (three) times daily.       No current facility-administered medications for this visit.     Allergies:   Amoxicillin and Codeine   Social History:  The patient  reports that she has been smoking Cigarettes.  She has a 20 pack-year smoking history. She has never used smokeless tobacco. She reports that she does not drink alcohol or use illicit drugs.   Family History:  The patient's family history includes Cancer in her father; Coronary artery disease in her father; Diabetes in her sister; Heart attack in her father; Heart disease in her father; Hyperlipidemia in her father; Hypertension in her mother; Pancreatic cancer in her father; Prostate cancer in her father. There is no history of Colon cancer.   ROS:  Please see the history of present illness.      All other systems reviewed and negative.   PHYSICAL EXAM: VS:  BP 96/63  Pulse 95  Ht 5\' 3"  (1.6 m)  Wt 125 lb (56.7 kg)  BMI 22.15 kg/m2  Filed Vitals:   06/13/14 1647 06/13/14 1649 06/13/14 1650 06/13/14 1652  BP: 114/68 112/66 87/57 96/63   Pulse: 90 88 94 95  Height:      Weight:         Well nourished, well developed, in no acute distress HEENT: normal Neck: no JVD Cardiac:  normal S1, S2; RRR; no murmur Lungs:  clear to auscultation bilaterally, no wheezing, rhonchi or rales Abd: soft, nontender, no hepatomegaly Ext: no edema Skin: warm and dry Neuro:  CNs 2-12 intact, no focal abnormalities noted  EKG:  NSR, HR 89, septal Q waves, inf-lat TWI, no significant change from prior tracing     ASSESSMENT AND PLAN:  Orthostatic hypotension:  Patient has symptoms of orthostatic intolerance.  Her  BP drops significant from lying to standing.  She has had difficulty tolerating CHF meds due to low BP in the past.  She is currently taking Isosorbide.  It is not clear what context this was started.  I have asked her to stop the Isosorbide.  She has compression  stockings at home.  I have asked her to start wearing these.  If her symptoms do no improve with these measures, we may have to consider Florinef, Midodrine or Pyridostigmine.     Syncope, unspecified syncope type:  EF remains < 35%.  She has not tolerated CHF medications in the past due to low BP.  Symptoms sound most consistent orthostatic intolerance.  But, she is at risk for ventricular arrhythmias.  I am not sure of her candidacy for ICD and will have to defer to Dr. Jenkins Rouge.  I will arrange an event monitor.  She does not currently drive.    Ischemic cardiomyopathy:  As noted, her BP is too low to tolerate any medications.    Chronic systolic heart failure:  She is NYHA 2b-3.  Volume is stable.    CAD:  No angina.  Continue ASA, Plavix.    Occlusion and stenosis of carotid artery without mention of cerebral infarction:  FU with Dr. Kellie Simmering as planned.   Disposition:  FU with Dr. Jenkins Rouge in 6 weeks.   Signed, Versie Starks, MHS 06/13/2014 2:32 PM    [facility] [address], [place], [place]  [address] Phone: [phone]; Fax: [phone]

## 2014-06-13 NOTE — Patient Instructions (Signed)
STOP IMDUR ; CALL THE OFFICE IF YOU HAVE CHEST PAIN  Your physician has recommended that you wear an event monitor. Event monitors are medical devices that record the heart's electrical activity. Doctors most often [place] these monitors to diagnose arrhythmias. Arrhythmias are problems with the speed or rhythm of the heartbeat. The monitor is a small, portable device. You can wear one while you do your normal daily activities. This is usually used to diagnose what is causing palpitations/syncope (passing out).  Your physician recommends that you schedule a follow-up appointment in: [place]. Johnsie Cancel

## 2014-06-14 ENCOUNTER — Encounter: Payer: Self-pay | Admitting: *Deleted

## 2014-06-14 ENCOUNTER — Encounter (INDEPENDENT_AMBULATORY_CARE_PROVIDER_SITE_OTHER): Payer: Medicare Other

## 2014-06-14 DIAGNOSIS — I951 Orthostatic hypotension: Secondary | ICD-10-CM

## 2014-06-14 DIAGNOSIS — R55 Syncope and collapse: Secondary | ICD-10-CM

## 2014-06-14 NOTE — Progress Notes (Signed)
Patient ID: Caroline [name], female   DOB: 12-30-1950, 63 y.o.   MRN: [id number] E-Cardio verite 30 day cardiac event monitor applied to patient.

## 2014-06-16 ENCOUNTER — Other Ambulatory Visit: Payer: Self-pay | Admitting: Cardiovascular Disease

## 2014-06-28 ENCOUNTER — Ambulatory Visit: Payer: Medicare Other | Admitting: Physician Assistant

## 2014-06-29 ENCOUNTER — Encounter: Payer: Self-pay | Admitting: Physician Assistant

## 2014-06-29 ENCOUNTER — Ambulatory Visit (INDEPENDENT_AMBULATORY_CARE_PROVIDER_SITE_OTHER): Payer: Medicare Other | Admitting: Physician Assistant

## 2014-06-29 VITALS — HR 92 | Temp 98.2°F | Resp 16 | Ht 63.0 in | Wt 123.8 lb

## 2014-06-29 DIAGNOSIS — I951 Orthostatic hypotension: Secondary | ICD-10-CM

## 2014-06-29 DIAGNOSIS — M479 Spondylosis, unspecified: Secondary | ICD-10-CM

## 2014-06-29 DIAGNOSIS — M47819 Spondylosis without myelopathy or radiculopathy, site unspecified: Secondary | ICD-10-CM

## 2014-06-29 MED ORDER — TRAMADOL HCL 50 MG PO TABS: 50.0000 mg | ORAL_TABLET | Freq: Two times a day (BID) | ORAL | Status: DC | PRN

## 2014-06-29 NOTE — Patient Instructions (Signed)
Please continue medications as directed.  Begin taking Tramadol twice daily for back pain.  Avoid heavy lifting or overexertion.  Consider letting me set you up with Physical therapy. Please stop by [facility] to pick up compression stockings.  Wear these daily.  Please follow-up with me in 2 weeks.  Wear your stockings to that visit.

## 2014-06-29 NOTE — Progress Notes (Signed)
Patient presents to clinic today for follow-up of dizziness and orthostatic hypotension.  Patient has been seen and evaluated by myself, Neurology (Dr. Karel Jarvis) and Cardiology Alben Spittle, PA-C).  Patient has stopped her Imdur as directed by myself and Cardiology.  Has spoken with her Psychiatrist and her dosage of Risperdal has been decreased.  Endorses improvement in symptoms.  Still with occasional dizziness, but much less frequent.  Is not wearing compression stockings as directed. Is trying to stay well hydrated.  Has added a Gatorade to her daily regimen per recommendations.    Patient still with moderate-severe back pain stemming from thoracolumbar osteoarthritis diagnosed with imaging.  Had previously withheld Rx pain medication from patient due to dizziness and excessive sedation from high doses of Risperdal and Seroquel. Patient's dosing has decreased.  States she feels more animated and has more energy.  Patient not a good candidate for NSAID therapy due to risk of bleed and already low blood pressure levels.  Past Medical History  Diagnosis Date  . GERD (gastroesophageal reflux disease)   . Grave's disease   . Dyslipidemia   . CAD (coronary artery disease)     [facility]  Diagonal instent restenosis treated 10/2005  . Myocardial infarction     3 stents 1st Apr 03, 2003, has had several  . Heart murmur     states years ago  . Shortness of breath     with exertion  . Bright disease   . Cancer     right breast 04-02-2001  . Anxiety   . Arthritis     back  . CHF (congestive heart failure)     states she was told se had this in past by MD  . Angina   . Hypertension     states a long time ago-states she never took medication  . Depression     04-02-02 had shock treatment due to depresson, spouse passed away 04/02/2000  . Carotid artery occlusion   . Atrial fibrillation     Current Outpatient Prescriptions on File Prior to Visit  Medication Sig Dispense Refill  . aspirin 325 MG tablet Take 325 mg by  mouth daily.      . Black Cohosh 540 MG CAPS Take 540 mg by mouth 2 (two) times daily.      . clonazepam (KLONOPIN) 2 MG disintegrating tablet Take 2 mg by mouth 3 (three) times daily.       . clopidogrel (PLAVIX) 75 MG tablet TAKE 1 TABLET BY MOUTH DAILY.  30 tablet  1  . fish oil-omega-3 fatty acids 1000 MG capsule Take 100 mg by mouth daily.      . QUEtiapine (SEROQUEL) 300 MG tablet Take 300 mg by mouth 2 (two) times daily.      . QUEtiapine (SEROQUEL) 400 MG tablet Take 400 mg by mouth at bedtime.       . risperiDONE (RISPERDAL) 2 MG tablet Take 1 mg by mouth 3 (three) times daily.       No current facility-administered medications on file prior to visit.    Allergies  Allergen Reactions  . Amoxicillin Hives  . Codeine Hives    Family History  Problem Relation Age of Onset  . Coronary artery disease Father   . Prostate cancer Father     not sure  . Pancreatic cancer Father     not sure  . Cancer Father     Pancreatic and  Prostate  . Heart disease Father     Before  age 84 and BPG  . Hyperlipidemia Father   . Heart attack Father   . Hypertension Mother   . Diabetes Sister   . Colon cancer Neg Hx     History   Social History  . Marital Status: Widowed    Spouse Name: N/A    Number of Children: N/A  . Years of Education: N/A   Social History Main Topics  . Smoking status: Light Tobacco Smoker -- 0.50 packs/day for 40 years    Types: Cigarettes  . Smokeless tobacco: Never Used  . Alcohol Use: No  . Drug Use: No  . Sexual Activity: None   Other Topics Concern  . None   Social History Narrative   She has 1 child.  She is on disability.   Review of Systems - See HPI.  All other ROS are negative.  Pulse 92  Temp(Src) 98.2 F (36.8 C) (Oral)  Resp 16  Ht $R'5\' 3"'UU$  (1.6 m)  Wt 123 lb 12 oz (56.133 kg)  BMI 21.93 kg/m2  SpO2 100%  Physical Exam  Vitals reviewed. Constitutional: She is oriented to person, place, and time and well-developed, well-nourished,  and in no distress.  HENT:  Head: Normocephalic and atraumatic.  Right Ear: External ear normal.  Left Ear: External ear normal.  Nose: Nose normal.  Mouth/Throat: Oropharynx is clear and moist. No oropharyngeal exudate.  TM within normal limits bilaterally.  Eyes: Conjunctivae are normal. Pupils are equal, round, and reactive to light.  Neck: Neck supple.  Cardiovascular: Normal rate, regular rhythm, normal heart sounds and intact distal pulses.   Pulmonary/Chest: Effort normal and breath sounds normal. No respiratory distress. She has no wheezes. She has no rales. She exhibits no tenderness.  Neurological: She is alert and oriented to person, place, and time.  Skin: Skin is warm and dry. No rash noted.  Psychiatric: Affect normal.   Recent Results (from the past 2160 hour(s))  CBC WITH DIFFERENTIAL     Status: Abnormal   Collection Time    05/10/14  4:04 PM      Result Value Ref Range   WBC 6.8  4.0 - 10.5 K/uL   RBC 4.06  3.87 - 5.11 MIL/uL   Hemoglobin 11.5 (*) 12.0 - 15.0 g/dL   HCT 33.2 (*) 36.0 - 46.0 %   MCV 81.8  78.0 - 100.0 fL   MCH 28.3  26.0 - 34.0 pg   MCHC 34.6  30.0 - 36.0 g/dL   RDW 16.5 (*) 11.5 - 15.5 %   Platelets 305  150 - 400 K/uL   Neutrophils Relative % 62  43 - 77 %   Neutro Abs 4.2  1.7 - 7.7 K/uL   Lymphocytes Relative 28  12 - 46 %   Lymphs Abs 1.9  0.7 - 4.0 K/uL   Monocytes Relative 8  3 - 12 %   Monocytes Absolute 0.5  0.1 - 1.0 K/uL   Eosinophils Relative 1  0 - 5 %   Eosinophils Absolute 0.1  0.0 - 0.7 K/uL   Basophils Relative 1  0 - 1 %   Basophils Absolute 0.1  0.0 - 0.1 K/uL   Smear Review Criteria for review not met    COMPREHENSIVE METABOLIC PANEL     Status: Abnormal   Collection Time    05/10/14  4:04 PM      Result Value Ref Range   Sodium 135  135 - 145 mEq/L   Potassium 4.7  3.5 -  5.3 mEq/L   Chloride 100  96 - 112 mEq/L   CO2 23  19 - 32 mEq/L   Glucose, Bld 94  70 - 99 mg/dL   BUN 28 (*) 6 - 23 mg/dL   Creat 2.35 (*)  0.50 - 1.10 mg/dL   Total Bilirubin 0.3  0.2 - 1.2 mg/dL   Alkaline Phosphatase 65  39 - 117 U/L   AST 12  0 - 37 U/L   ALT <8  0 - 35 U/L   Total Protein 7.1  6.0 - 8.3 g/dL   Albumin 4.4  3.5 - 5.2 g/dL   Calcium 9.8  8.4 - 10.5 mg/dL  TSH     Status: None   Collection Time    05/10/14  4:04 PM      Result Value Ref Range   TSH 3.689  0.350 - 4.500 uIU/mL  BASIC METABOLIC PANEL WITH GFR     Status: Abnormal   Collection Time    05/18/14  2:29 PM      Result Value Ref Range   Sodium 133 (*) 135 - 145 mEq/L   Potassium 4.4  3.5 - 5.3 mEq/L   Chloride 101  96 - 112 mEq/L   CO2 22  19 - 32 mEq/L   Glucose, Bld 109 (*) 70 - 99 mg/dL   BUN 24 (*) 6 - 23 mg/dL   Creat 2.11 (*) 0.50 - 1.10 mg/dL   Calcium 9.0  8.4 - 10.5 mg/dL   GFR, Est African American 28 (*)    GFR, Est Non African American 25 (*)    Comment:       The estimated GFR is a calculation valid for adults (>=63 years old)     that uses the CKD-EPI algorithm to adjust for age and sex. It is       not to be used for children, pregnant women, hospitalized patients,        patients on dialysis, or with rapidly changing kidney function.     According to the NKDEP, eGFR >89 is normal, 60-89 shows mild     impairment, 30-59 shows moderate impairment, 15-29 shows severe     impairment and <15 is ESRD.          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  Discussed good posture.  Avoid heavy lifting or overexertion.  Orthostatic hypotension Symptoms improved but patient still orthostatic when OVS are checked. Imdur has been stopped.  Seroquel and Risperdal dosages have been reduced by specialist. Patient still has not used prescription for Compression Stockings.  Discussed Florinef with patient but she wants to try compression stockings first. Wear compression stockings daily.  Gatorade daily.  Follow-up in 1-2 weeks for repeat vitals.

## 2014-06-29 NOTE — Progress Notes (Signed)
Pre visit review using our clinic review tool, if applicable. No additional management support is needed unless otherwise documented below in the visit note/SLS  

## 2014-07-03 DIAGNOSIS — M479 Spondylosis, unspecified: Secondary | ICD-10-CM | POA: Insufficient documentation

## 2014-07-03 DIAGNOSIS — I951 Orthostatic hypotension: Secondary | ICD-10-CM | POA: Insufficient documentation

## 2014-07-03 NOTE — Assessment & Plan Note (Signed)
Symptoms improved but patient still orthostatic when OVS are checked. Imdur has been stopped.  Seroquel and Risperdal dosages have been reduced by specialist. Patient still has not used prescription for Compression Stockings.  Discussed Florinef with patient but she wants to try compression stockings first. Wear compression stockings daily.  Gatorade daily.  Follow-up in 1-2 weeks for repeat vitals.

## 2014-07-03 NOTE — Assessment & Plan Note (Signed)
Rx Tramadol for severe pain.  Discussed good posture.  Avoid heavy lifting or overexertion.

## 2014-07-05 ENCOUNTER — Telehealth: Payer: Self-pay | Admitting: Physician Assistant

## 2014-07-05 ENCOUNTER — Ambulatory Visit: Payer: Medicare Other | Admitting: Cardiovascular Disease

## 2014-07-05 NOTE — Telephone Encounter (Signed)
Caller name: Byanka  Call back number:[phone]   Reason for call: pt states that [facility] needs a code on the script for the socks that she wears.  Hard to understand the pt.

## 2014-07-06 NOTE — Telephone Encounter (Signed)
Code is 458.0 for Orthostatic Hypotension.  She can give this info to [facility] or if she wishes we can call them and give them the needed information. (Patient with some mild memory deficits).

## 2014-07-06 NOTE — Telephone Encounter (Signed)
[facility] notified of code. Representative states that its in her records now.

## 2014-07-25 ENCOUNTER — Ambulatory Visit: Payer: Medicare Other | Admitting: Cardiovascular Disease

## 2014-07-25 ENCOUNTER — Encounter: Payer: Self-pay | Admitting: Cardiovascular Disease

## 2014-07-25 ENCOUNTER — Ambulatory Visit (INDEPENDENT_AMBULATORY_CARE_PROVIDER_SITE_OTHER): Payer: Medicare Other | Admitting: Cardiovascular Disease

## 2014-07-25 VITALS — BP 98/54 | HR 75 | Ht 63.0 in | Wt 121.8 lb

## 2014-07-25 DIAGNOSIS — I951 Orthostatic hypotension: Secondary | ICD-10-CM

## 2014-07-25 DIAGNOSIS — I6529 Occlusion and stenosis of unspecified carotid artery: Secondary | ICD-10-CM

## 2014-07-25 DIAGNOSIS — I2510 Atherosclerotic heart disease of native coronary artery without angina pectoris: Secondary | ICD-10-CM

## 2014-07-25 NOTE — Assessment & Plan Note (Signed)
Stable with no angina and good activity level.  Continue medical Rx  

## 2014-07-25 NOTE — Patient Instructions (Signed)
Your physician wants you to follow-up in:  6 MONTHS WITH DR NISHAN  You will receive a reminder letter in the mail two months in advance. If you don't receive a letter, please call our office to schedule the follow-up appointment. Your physician recommends that you continue on your current medications as directed. Please refer to the Current Medication list given to you today. 

## 2014-07-25 NOTE — Assessment & Plan Note (Signed)
Should discuss with psychiatrist and Dr Hassell Done minimizes klonipin , seraquel and reperidal  Nitrates d/c  With CAD would not use midodrin

## 2014-07-25 NOTE — Assessment & Plan Note (Signed)
F/u Caroline [name] RICA occlusion left CEA  Duplex per [facility]

## 2014-07-25 NOTE — Progress Notes (Signed)
Patient ID: Caroline [name], female   DOB: 1951/01/04, 63 y.o.   MRN: [id number] DAJANA [name] is a 63 y.o. 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 She presents today for the evaluation of dizziness, near syncope and syncope. This has been ongoing for several mos. Last episode of syncope was several mos ago. This typically occurs with standing. However, she has had near syncope while sitting at rest. She is fairly sedentary. She notes some shortness of breath. She denies chest pain. She denies true orthopnea, PND. She denies LE edema. She has seen neurology. There was no neurogenic cause found. FU echo was done recently with EF 30-35%. She is referred back for evaluation.  Studies:  - LHC (6/10): Mid Diag 90% ISR, prox RCA 20%, mid RCA 40%, EF 35% global HK with apical AK >>> PCI: Cutting balloon POBA of Diag ISR  - Echo (5/10): EF 40%  - Echo (7/15): EF 30-35%, AS dyskinesis, inf HK, mild AI, mild MR  - Nuclear (10/14): Lg anterior, apical and inf-apical infarct, no ischemia, EF 31% - similar to study from 2010; Intermediate Risk >>> Med Rx  - Carotid US (7/15): RICA occluded, L CEA patent  Recent Labs/Images:  05/10/2014: ALT <8; Hemoglobin 11.5*; TSH 3.689  05/18/2014: Creatinine 2.11*; Potassium 4.4  Ct Head Wo Contrast  05/17/2014 CLINICAL DATA: Recurrent dizziness for 2 months, several false EXAM: CT HEAD WITHOUT CONTRAST TECHNIQUE: Contiguous axial images were obtained from the base of the skull through the vertex without intravenous contrast. COMPARISON: CT brain scan of 04/17/2012 FINDINGS: The ventricular system remains prominent as are the cortical sulci and cerebellar folia, consistent with diffuse atrophy. The septum remains in a midline position. Moderate small vessel ischemic change is again noted throughout the periventricular  white matter. No hemorrhage, mass lesion, or acute infarction is seen. On bone window images, no calvarial abnormality is seen. The paranasal sinuses that are visualized are clear. IMPRESSION: Atrophy and moderate small vessel ischemic change. No acute intracranial abnormality. Electronically Signed By: Ivar Drape M.D. On: 05/17/2014 13:20    Reviewed event monitor from 8/12 to 07/13/14  No arrythmia NSR occasional PVC  No correlation of dizzyness with rhythm  Orthostatic symptoms better of nitrates  Seeing Elyn Aquas for primary out [place]      ROS: Denies fever, malais, weight loss, blurry vision, decreased visual acuity, cough, sputum, SOB, hemoptysis, pleuritic pain, palpitaitons, heartburn, abdominal pain, melena, lower extremity edema, claudication, or rash.  All other systems reviewed and negative  General:  Not orthostatic but BP only 37-04  Systolic sitting and stading  Affect appropriate Chronically ill frail female  HEENT: normal Neck supple with no adenopathy JVP normal no bruits no thyromegaly Lungs clear with no wheezing and good diaphragmatic motion Heart:  S1/S2 no murmur, no rub, gallop or click PMI normal Abdomen: benighn, BS positve, no tenderness, no AAA no bruit.  No HSM or HJR Distal pulses intact with no bruits No edema Neuro non-focal Skin warm and dry No muscular weakness   Current Outpatient Prescriptions  Medication Sig Dispense Refill  . aspirin 325 MG tablet Take 325 mg by mouth daily.      . Black Cohosh 540 MG CAPS Take 540 mg by mouth 2 (two) times daily.      . clonazepam (KLONOPIN) 2 MG  disintegrating tablet Take 2 mg by mouth 3 (three) times daily.       . clopidogrel (PLAVIX) 75 MG tablet TAKE 1 TABLET BY MOUTH DAILY.  30 tablet  1  . fish oil-omega-3 fatty acids 1000 MG capsule Take 100 mg by mouth daily.      . QUEtiapine (SEROQUEL) 300 MG tablet Take 300 mg by mouth 2 (two) times daily.      . QUEtiapine (SEROQUEL) 400 MG tablet Take 400  mg by mouth at bedtime.       . risperiDONE (RISPERDAL) 2 MG tablet Take 1 mg by mouth 3 (three) times daily.      . traMADol (ULTRAM) 50 MG tablet Take 1 tablet (50 mg total) by mouth every 12 (twelve) hours as needed for severe pain.  120 tablet  0   No current facility-administered medications for this visit.    Allergies  Amoxicillin and Codeine  Electrocardiogram:  06/13/14  SR LAD poor R wave progression   Assessment and Plan

## 2014-08-15 ENCOUNTER — Other Ambulatory Visit: Payer: Self-pay | Admitting: Cardiovascular Disease

## 2014-11-03 HISTORY — PX: MULTIPLE TOOTH EXTRACTIONS: SHX2053

## 2014-11-18 ENCOUNTER — Other Ambulatory Visit: Payer: Self-pay | Admitting: Cardiovascular Disease

## 2014-11-23 ENCOUNTER — Telehealth: Payer: Self-pay | Admitting: Physician Assistant

## 2014-11-23 NOTE — Telephone Encounter (Signed)
Patient family member called in stating that they think patient has had a stroke. Call transferred to [facility]

## 2014-11-27 NOTE — Telephone Encounter (Signed)
Patient's friend, Caroline [name] called [facility] stating that she was concerned patient was having a stroke.  [facility] Triage note states: Caller states patient is having trouble eating and swallowing for about 6 months.  Thinks possible stroke.  Disposition: See Physician within 24 hours. Patient disagrees with this plan.    Called Caroline [name] to assess patient symptoms.  She states that patient has been having trouble eating and stating her "teeth feel bad."  This issue has been going on for 6 months, and two months ago she noticed that the patient was speaking differently, barely opening her mouth to talk.  She has been eating only soft foods.  Caroline [name] states that she is willing to take her to appointment, but patient refuses.    Called patient who spoke very quietly but did not have noticeably slurred speech.  She was advised by [facility] to make appointment and was scheduled for 11/28/14, but refused to come to this appointment due to transportation issues.  Patient verbalized understanding of recommendations (see Physician within 24 hours), but refuses and states that she will make an appointment for later this week.

## 2014-11-27 NOTE — Telephone Encounter (Signed)
Noted, unlikely Acute CVA as pt's symptoms have been present x 6 months. Agree with need for office visit.

## 2014-11-27 NOTE — Telephone Encounter (Signed)
Agree.  Patient needs OV.

## 2014-11-28 ENCOUNTER — Ambulatory Visit: Payer: Self-pay | Admitting: Family Medicine

## 2015-01-21 NOTE — Progress Notes (Signed)
Patient ID: Caroline [name], female   DOB: September 13, 1951, 64 y.o.   MRN: [id number] Caroline [name] is a 64 y.o. 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 She presents today for the evaluation of dizziness, near syncope and syncope. This has been ongoing for several mos. Last episode of syncope was several mos ago. This typically occurs with standing. However, she has had near syncope while sitting at rest. She is fairly sedentary. She notes some shortness of breath. She denies chest pain. She denies true orthopnea, PND. She denies LE edema. She has seen neurology. There was no neurogenic cause found. FU echo was done recently with EF 30-35%. She is referred back for evaluation.   Studies:  - LHC (6/10): Mid Diag 90% ISR, prox RCA 20%, mid RCA 40%, EF 35% global HK with apical AK >>> PCI: Cutting balloon POBA of Diag ISR  - Echo (5/10): EF 40%  - Echo (7/15): EF 30-35%, AS dyskinesis, inf HK, mild AI, mild MR  - Nuclear (10/14): Lg anterior, apical and inf-apical infarct, no ischemia, EF 31% - similar to study from 2010; Intermediate Risk >>> Med Rx  - Carotid US (7/15): RICA occluded, L CEA patent   Recent Labs/Images:  05/10/2014: ALT <8; Hemoglobin 11.5*; TSH 3.689  05/18/2014: Creatinine 2.11*; Potassium 4.4  Ct Head Wo Contrast  05/17/2014 CLINICAL DATA: Recurrent dizziness for 2 months, several false EXAM: CT HEAD WITHOUT CONTRAST TECHNIQUE: Contiguous axial images were obtained from the base of the skull through the vertex without intravenous contrast. COMPARISON: CT brain scan of 04/17/2012 FINDINGS: The ventricular system remains prominent as are the cortical sulci and cerebellar folia, consistent with diffuse atrophy. The septum remains in a midline position. Moderate small vessel ischemic change is again noted throughout the  periventricular white matter. No hemorrhage, mass lesion, or acute infarction is seen. On bone window images, no calvarial abnormality is seen. The paranasal sinuses that are visualized are clear. IMPRESSION: Atrophy and moderate small vessel ischemic change. No acute intracranial abnormality. Electronically Signed By: Ivar Drape M.D. On: 05/17/2014 13:20    Reviewed event monitor from 8/12 to 07/13/14  No arrythmia NSR occasional PVC  No correlation of dizzyness with rhythm  Orthostatic symptoms better off nitrates  Seeing Elyn Aquas for primary out [place]   Currently off ACE/beta blocker despite ischemic DCM Overdue for carotid duplex  ROS: Denies fever, malais, weight loss, blurry vision, decreased visual acuity, cough, sputum, SOB, hemoptysis, pleuritic pain, palpitaitons, heartburn, abdominal pain, melena, lower extremity edema, claudication, or rash.  All other systems reviewed and negative  General:  Not orthostatic but BP only 41-32  Systolic sitting and stading  Affect appropriate Chronically ill frail female  HEENT: normal Neck supple with no adenopathy JVP normal Left CEA scar left bruits no thyromegaly Lungs clear with no wheezing and good diaphragmatic motion Heart:  S1/S2 no murmur, no rub, gallop or click PMI normal Abdomen: benighn, BS positve, no tenderness, no AAA no bruit.  No HSM or HJR Distal pulses intact with no bruits No edema Neuro non-focal Skin warm and dry No muscular weakness   Current Outpatient Prescriptions  Medication Sig Dispense Refill  . aspirin 325 MG tablet Take 325 mg by mouth daily.    . Black Cohosh 540 MG CAPS Take 540 mg by mouth 2 (  two) times daily.    . clonazepam (KLONOPIN) 2 MG disintegrating tablet Take 2 mg by mouth 3 (three) times daily.     . clopidogrel (PLAVIX) 75 MG tablet TAKE 1 TABLET BY MOUTH DAILY. 30 tablet 1  . fish oil-omega-3 fatty acids 1000 MG capsule Take 100 mg by mouth daily.    . QUEtiapine (SEROQUEL) 300 MG  tablet Take 300 mg by mouth 2 (two) times daily.    . QUEtiapine (SEROQUEL) 400 MG tablet Take 400 mg by mouth at bedtime.     . risperiDONE (RISPERDAL) 2 MG tablet Take 1 mg by mouth 3 (three) times daily.    . traMADol (ULTRAM) 50 MG tablet Take 1 tablet (50 mg total) by mouth every 12 (twelve) hours as needed for severe pain. 120 tablet 0   No current facility-administered medications for this visit.    Allergies  Amoxicillin and Codeine  Electrocardiogram:  06/13/14  SR LAD poor R wave progression   Assessment and Plan

## 2015-01-22 ENCOUNTER — Other Ambulatory Visit: Payer: Self-pay | Admitting: Cardiovascular Disease

## 2015-01-23 ENCOUNTER — Ambulatory Visit (INDEPENDENT_AMBULATORY_CARE_PROVIDER_SITE_OTHER): Payer: Medicare Other | Admitting: Cardiovascular Disease

## 2015-01-23 ENCOUNTER — Encounter: Payer: Self-pay | Admitting: Cardiovascular Disease

## 2015-01-23 VITALS — BP 138/68 | HR 95 | Ht 63.0 in | Wt 124.0 lb

## 2015-01-23 DIAGNOSIS — I255 Ischemic cardiomyopathy: Secondary | ICD-10-CM | POA: Insufficient documentation

## 2015-01-23 DIAGNOSIS — Z79899 Other long term (current) drug therapy: Secondary | ICD-10-CM

## 2015-01-23 DIAGNOSIS — I2510 Atherosclerotic heart disease of native coronary artery without angina pectoris: Secondary | ICD-10-CM

## 2015-01-23 DIAGNOSIS — I639 Cerebral infarction, unspecified: Secondary | ICD-10-CM

## 2015-01-23 DIAGNOSIS — I63239 Cerebral infarction due to unspecified occlusion or stenosis of unspecified carotid arteries: Secondary | ICD-10-CM

## 2015-01-23 MED ORDER — CARVEDILOL 3.125 MG PO TABS: 3.1250 mg | ORAL_TABLET | Freq: Two times a day (BID) | ORAL | Status: DC

## 2015-01-23 MED ORDER — LOSARTAN POTASSIUM 25 MG PO TABS: 25.0000 mg | ORAL_TABLET | Freq: Every day | ORAL | Status: DC

## 2015-01-23 NOTE — Assessment & Plan Note (Signed)
Known right ICA occlusion Left CEA with mild disease f/u duplex

## 2015-01-23 NOTE — Assessment & Plan Note (Signed)
Stable with no angina and good activity level.  Continue medical Rx  

## 2015-01-23 NOTE — Patient Instructions (Addendum)
Your physician recommends that you schedule a follow-up appointment in:   [place] has recommended you make the following change in your medication:  START LOSARTAN 25 MG  EVERY DAY  CARVEDILOL 3.125 MG  [place] physician has requested that you have a carotid duplex. This test is an ultrasound of the carotid arteries in your neck. It looks at blood flow through these arteries that supply the brain with blood. Allow one hour for this exam. There are no restrictions or special instructions. 1-2  WEEKS   Your physician recommends that you return for lab work in: [place]

## 2015-01-23 NOTE — Assessment & Plan Note (Signed)
Euvolemic  Restart cozaar 25 mg and coreg 3.125 bid  Has been orthostatic in past so will have to watch.  Due to psychiatric issues and poor functional capacity have not referred to EP for AICD consideration

## 2015-01-31 ENCOUNTER — Encounter (HOSPITAL_COMMUNITY): Payer: Medicare Other

## 2015-01-31 ENCOUNTER — Other Ambulatory Visit: Payer: Medicare Other

## 2015-02-05 ENCOUNTER — Telehealth: Payer: Self-pay | Admitting: Cardiovascular Disease

## 2015-02-05 NOTE — Telephone Encounter (Signed)
PT NEEDING   ORAL SURGERY  NEEDS  TO KNOW   HOW  LONG TO  HOLD PLAVIX  FOR  WILL FORWARD  TO  DR Johnsie Cancel  TO  REVIEW .Adonis Housekeeper

## 2015-02-05 NOTE — Telephone Encounter (Signed)
PT  AWARE./CY 

## 2015-02-05 NOTE — Telephone Encounter (Signed)
Hold plavix for 5 days before oral surgery

## 2015-02-05 NOTE — Telephone Encounter (Signed)
New Message  Pt wanted to speak w/ Rn about Plavix. Please call back and discuss.

## 2015-02-21 ENCOUNTER — Telehealth: Payer: Self-pay | Admitting: Cardiovascular Disease

## 2015-02-21 NOTE — Telephone Encounter (Signed)
PT  AWARE  FORM FAXED  TODAY TO  DR LITTLE./CY

## 2015-02-21 NOTE — Telephone Encounter (Signed)
New problem   Pt want to know status form she brought to office yesterday about her Plavix that Dr Johnsie Cancel is suppose to fill out. Please advise pt.

## 2015-02-21 NOTE — Telephone Encounter (Signed)
PAPERWORK WAITING FOR  DR Johnsie Cancel  TO  SIGN./CY

## 2015-02-21 NOTE — Telephone Encounter (Signed)
Walk in pt Form - paper dropped off from [place] pt needs completed gave to [place].

## 2015-03-06 ENCOUNTER — Other Ambulatory Visit: Payer: Self-pay | Admitting: Cardiovascular Disease

## 2015-03-06 DIAGNOSIS — I6529 Occlusion and stenosis of unspecified carotid artery: Secondary | ICD-10-CM

## 2015-03-21 ENCOUNTER — Encounter: Payer: Self-pay | Admitting: Physician Assistant

## 2015-03-21 ENCOUNTER — Telehealth: Payer: Self-pay | Admitting: Physician Assistant

## 2015-03-21 ENCOUNTER — Ambulatory Visit (INDEPENDENT_AMBULATORY_CARE_PROVIDER_SITE_OTHER): Payer: Medicare Other | Admitting: Physician Assistant

## 2015-03-21 VITALS — BP 107/63 | HR 93 | Temp 98.6°F | Ht 63.0 in | Wt 115.4 lb

## 2015-03-21 DIAGNOSIS — M47819 Spondylosis without myelopathy or radiculopathy, site unspecified: Secondary | ICD-10-CM | POA: Diagnosis not present

## 2015-03-21 DIAGNOSIS — K529 Noninfective gastroenteritis and colitis, unspecified: Secondary | ICD-10-CM | POA: Diagnosis not present

## 2015-03-21 DIAGNOSIS — Z1239 Encounter for other screening for malignant neoplasm of breast: Secondary | ICD-10-CM

## 2015-03-21 MED ORDER — TRAMADOL HCL 50 MG PO TABS: 50.0000 mg | ORAL_TABLET | Freq: Two times a day (BID) | ORAL | Status: DC | PRN

## 2015-03-21 NOTE — Assessment & Plan Note (Signed)
Unclear etiology.  Will start fiber supplementation to bulk stools. Will obtain lab results -- CBC, CMP, Lipase, Stool culture, O/P, C. Diff and fecal WBCs.

## 2015-03-21 NOTE — Progress Notes (Signed)
Patient presents to clinic today c/o 6 months of intermittent diarrhea noticed in the mornings and sometimes reoccurs in the afternoon.  Endorses mild anorexia.  Endorses fecal urgency.  Denies hematochezia, tenesmus, melena.  Denies fever but endorses some mild decrease in weight.  Last Colonoscopy in 2002/02/12.  Endorses good hydration and well-balanced diet. Denies fiber supplementation.  Past Medical History  Diagnosis Date  . GERD (gastroesophageal reflux disease)   . Grave's disease   . Dyslipidemia   . CAD (coronary artery disease)     [facility] instent restenosis treated 10/2005  . Myocardial infarction     3 stents 1st 2004, has had several  . Heart murmur     states years ago  . Shortness of breath     with exertion  . Bright disease   . Cancer     right breast 02-12-01  . Anxiety   . Arthritis     back  . CHF (congestive heart failure)     states she was told se had this in past by MD  . Angina   . Hypertension     states a long time ago-states she never took medication  . Depression     12-Feb-2002 had shock treatment due to depresson, spouse passed away Feb 13, 2000  . Carotid artery occlusion   . Atrial fibrillation     Current Outpatient Prescriptions on File Prior to Visit  Medication Sig Dispense Refill  . aspirin 325 MG tablet Take 325 mg by mouth daily.    . Black Cohosh 540 MG CAPS Take 540 mg by mouth 2 (two) times daily.    . carvedilol (COREG) 3.125 MG tablet Take 1 tablet (3.125 mg total) by mouth 2 (two) times daily. 180 tablet 3  . clonazepam (KLONOPIN) 2 MG disintegrating tablet Take 2 mg by mouth 3 (three) times daily.     . clopidogrel (PLAVIX) 75 MG tablet Take 1 tablet (75 mg total) by mouth daily. 30 tablet 11  . fish oil-omega-3 fatty acids 1000 MG capsule Take 100 mg by mouth daily.    [name] losartan (COZAAR) 25 MG tablet Take 1 tablet (25 mg total) by mouth daily. 90 tablet 3  . QUEtiapine (SEROQUEL) 400 MG tablet Take 400 mg by mouth at bedtime.       . risperiDONE (RISPERDAL) 2 MG tablet Take 1 mg by mouth 3 (three) times daily.     No current facility-administered medications on file prior to visit.    Allergies  Allergen Reactions  . Amoxicillin Hives  . Codeine Hives    Family History  Problem Relation Age of Onset  . Coronary artery disease Father   . Prostate cancer Father     not sure  . Pancreatic cancer Father     not sure  . Cancer Father     Pancreatic and  Prostate  . Heart disease Father     Before age 78 and BPG  . Hyperlipidemia Father   . Heart attack Father   . Hypertension Mother   . Diabetes Sister   . Colon cancer Neg Hx     History   Social History  . Marital Status: Widowed    Spouse Name: N/A  . Number of Children: N/A  . Years of Education: N/A   Social History Main Topics  . Smoking status: Light Tobacco Smoker -- 0.50 packs/day for 40 years    Types: Cigarettes  . Smokeless tobacco: Never Used  .  Alcohol Use: No  . Drug Use: No  . Sexual Activity: Not on file   Other Topics Concern  . None   Social History Narrative   She has 1 child.  She is on disability.   Review of Systems - See HPI.  All other ROS are negative.  BP 107/63 mmHg  Pulse 93  Temp(Src) 98.6 F (37 C) (Oral)  Ht 5\' 3"  (1.6 m)  Wt 115 lb 6.4 oz (52.345 kg)  BMI 20.45 kg/m2  SpO2 99%  Physical Exam  Constitutional: She is oriented to person, place, and time and well-developed, well-nourished, and in no distress.  HENT:  Head: Normocephalic and atraumatic.  Cardiovascular: Normal rate, regular rhythm, normal heart sounds and intact distal pulses.   Pulmonary/Chest: Effort normal and breath sounds normal. No respiratory distress. She has no wheezes. She has no rales. She exhibits no tenderness.  Abdominal: Soft. Bowel sounds are normal. She exhibits no distension and no mass. There is no tenderness. There is no rebound and no guarding.  Neurological: She is alert and oriented to person, place, and time.   Skin: Skin is warm and dry. No rash noted.  Psychiatric: Affect normal.  Vitals reviewed.  Assessment/Plan: Chronic diarrhea Unclear etiology.  Will start fiber supplementation to bulk stools. Will obtain lab results -- CBC, CMP, Lipase, Stool culture, O/P, C. Diff and fecal WBCs.

## 2015-03-21 NOTE — Telephone Encounter (Signed)
Caller name: Eisha Chatterjee Relationship to patient: self Can be reached: [phone]  Reason for call: Pt requesting referral to get a mammogram. She states she has not had one sine 2004. Pt would like to go to radiology in our building.

## 2015-03-21 NOTE — Progress Notes (Signed)
Pre visit review using our clinic review tool, if applicable. No additional management support is needed unless otherwise documented below in the visit note. 

## 2015-03-21 NOTE — Patient Instructions (Signed)
Please start a daily fiber supplement (Benefiber, Metamucil, Fiber One).  Stay well hydrated.    Stop by the lab for blood work and a stool kit.  If all looks good, we will need you to see Gastroenterology for further assessment of symptoms and a colonoscopy.    I have refilled your Tramadol this once.  You will have to submit a urine sample before another refill will be given.

## 2015-03-22 LAB — COMPREHENSIVE METABOLIC PANEL
ALT: 11 U/L (ref 0–35)
AST: 14 U/L (ref 0–37)
Albumin: 4.2 g/dL (ref 3.5–5.2)
Alkaline Phosphatase: 56 U/L (ref 39–117)
BUN: 22 mg/dL (ref 6–23)
CHLORIDE: 102 meq/L (ref 96–112)
CO2: 23 meq/L (ref 19–32)
Calcium: 10.4 mg/dL (ref 8.4–10.5)
Creatinine, Ser: 2.43 mg/dL — ABNORMAL HIGH (ref 0.40–1.20)
GFR: 21.44 mL/min — ABNORMAL LOW (ref >60.00)
Glucose, Bld: 109 mg/dL — ABNORMAL HIGH (ref 70–99)
POTASSIUM: 4.4 meq/L (ref 3.5–5.1)
SODIUM: 134 meq/L — AB (ref 135–145)
Total Bilirubin: 0.3 mg/dL (ref 0.2–1.2)
Total Protein: 7.5 g/dL (ref 6.0–8.3)

## 2015-03-22 LAB — CBC WITH DIFFERENTIAL/PLATELET
Basophils Absolute: 0.1 10*3/uL (ref 0.0–0.1)
Basophils Relative: 1.2 % (ref 0.0–3.0)
Eosinophils Absolute: 0.1 10*3/uL (ref 0.0–0.7)
Eosinophils Relative: 1.5 % (ref 0.0–5.0)
HEMATOCRIT: 33.4 % — AB (ref 36.0–46.0)
HEMOGLOBIN: 11.4 g/dL — AB (ref 12.0–15.0)
LYMPHS ABS: 1.7 10*3/uL (ref 0.7–4.0)
LYMPHS PCT: 30.6 % (ref 12.0–46.0)
MCHC: 34.1 g/dL (ref 30.0–36.0)
MCV: 85.4 fl (ref 78.0–100.0)
Monocytes Absolute: 0.4 10*3/uL (ref 0.1–1.0)
Monocytes Relative: 6.6 % (ref 3.0–12.0)
Neutro Abs: 3.3 10*3/uL (ref 1.4–7.7)
Neutrophils Relative %: 60.1 % (ref 43.0–77.0)
Platelets: 289 10*3/uL (ref 150.0–400.0)
RBC: 3.91 Mil/uL (ref 3.87–5.11)
RDW: 15.4 % (ref 11.5–15.5)
WBC: 5.5 10*3/uL (ref 4.0–10.5)

## 2015-03-22 LAB — LIPASE: Lipase: 37 U/L (ref 11.0–59.0)

## 2015-03-27 NOTE — Telephone Encounter (Signed)
Called and [facility] @ 2:32pm @ [phone]) asking the pt to RTC.//AB/CMA

## 2015-03-29 ENCOUNTER — Telehealth: Payer: Self-pay | Admitting: *Deleted

## 2015-03-29 ENCOUNTER — Telehealth: Payer: Self-pay | Admitting: Physician Assistant

## 2015-03-29 ENCOUNTER — Encounter (HOSPITAL_COMMUNITY): Payer: Medicare Other

## 2015-03-29 ENCOUNTER — Other Ambulatory Visit: Payer: Medicare Other

## 2015-03-29 LAB — CLOSTRIDIUM DIFFICILE BY PCR: CDIFFPCR: DETECTED — AB

## 2015-03-29 LAB — C. DIFFICILE GDH AND TOXIN A/B
C. difficile GDH: DETECTED — AB
C. difficile Toxin A/B: NOT DETECTED

## 2015-03-29 LAB — OVA AND PARASITE EXAMINATION: OP: NONE SEEN

## 2015-03-29 MED ORDER — METRONIDAZOLE 500 MG PO TABS: 500.0000 mg | ORAL_TABLET | Freq: Three times a day (TID) | ORAL | Status: DC

## 2015-03-29 NOTE — Telephone Encounter (Signed)
Notified patient who stated understanding and agreed. Patient stated she would stay hydrated and avoid alcohol while on medication.  She would not like to schedule follow-up at this time and prefers to call back to make appointment.

## 2015-03-29 NOTE — Telephone Encounter (Signed)
[facility] called that patient is positive for C-Diff.

## 2015-03-29 NOTE — Telephone Encounter (Signed)
I sent a result note to you earlier regarding contacting the patient for treatment.  Please see that note.

## 2015-03-29 NOTE — Telephone Encounter (Signed)
Patient stool studies came back positive for c. Diff infection as likely cause of her symptoms.  Will Rx Flagyl to take three times daily for 10 days. Stay well hydrated.  Do not drink while on medication.  Follow-up in 7-10 days.

## 2015-03-30 LAB — FECAL LACTOFERRIN, QUANT: LACTOFERRIN: NEGATIVE

## 2015-03-30 NOTE — Telephone Encounter (Signed)
Spoke with the pt on (03-28-15) and asked the reason for Mammogram request.  Pt stated for screening.  Order for Mammogram placed.//AB/CMA

## 2015-04-01 LAB — STOOL CULTURE

## 2015-04-11 ENCOUNTER — Ambulatory Visit: Payer: Medicare Other | Admitting: Cardiovascular Disease

## 2015-04-18 ENCOUNTER — Encounter (HOSPITAL_COMMUNITY): Payer: Medicare Other

## 2015-04-18 ENCOUNTER — Other Ambulatory Visit: Payer: Medicare Other

## 2015-05-08 ENCOUNTER — Ambulatory Visit: Payer: Medicare Other | Admitting: Family

## 2015-05-08 ENCOUNTER — Other Ambulatory Visit (HOSPITAL_COMMUNITY): Payer: Medicare Other

## 2015-05-15 ENCOUNTER — Telehealth: Payer: Self-pay

## 2015-05-15 NOTE — Telephone Encounter (Signed)
Called to schedule Medicare Wellness Visit.  Left a message for call back.

## 2015-05-29 ENCOUNTER — Other Ambulatory Visit (INDEPENDENT_AMBULATORY_CARE_PROVIDER_SITE_OTHER): Payer: Medicare Other

## 2015-05-29 ENCOUNTER — Ambulatory Visit (INDEPENDENT_AMBULATORY_CARE_PROVIDER_SITE_OTHER): Payer: Medicare Other

## 2015-05-29 ENCOUNTER — Ambulatory Visit (HOSPITAL_COMMUNITY): Payer: Medicare Other | Attending: Cardiovascular Disease

## 2015-05-29 VITALS — BP 104/68 | HR 110 | Wt 124.0 lb

## 2015-05-29 DIAGNOSIS — I6523 Occlusion and stenosis of bilateral carotid arteries: Secondary | ICD-10-CM | POA: Diagnosis not present

## 2015-05-29 DIAGNOSIS — I6529 Occlusion and stenosis of unspecified carotid artery: Secondary | ICD-10-CM

## 2015-05-29 DIAGNOSIS — R42 Dizziness and giddiness: Secondary | ICD-10-CM

## 2015-05-29 DIAGNOSIS — Z79899 Other long term (current) drug therapy: Secondary | ICD-10-CM | POA: Diagnosis not present

## 2015-05-29 LAB — BASIC METABOLIC PANEL
BUN: 20 mg/dL (ref 6–23)
CALCIUM: 10.3 mg/dL (ref 8.4–10.5)
CHLORIDE: 99 meq/L (ref 96–112)
CO2: 23 meq/L (ref 19–32)
Creatinine, Ser: 1.96 mg/dL — ABNORMAL HIGH (ref 0.40–1.20)
GFR: 27.46 mL/min — AB (ref >60.00)
Glucose, Bld: 187 mg/dL — ABNORMAL HIGH (ref 70–99)
Potassium: 3.8 mEq/L (ref 3.5–5.1)
SODIUM: 132 meq/L — AB (ref 135–145)

## 2015-05-29 MED ORDER — LOSARTAN POTASSIUM 25 MG PO TABS: 12.5000 mg | ORAL_TABLET | Freq: Every day | ORAL | Status: DC

## 2015-05-29 NOTE — Progress Notes (Signed)
Patient was here having a carotid ultrasound.  Technician concerned that patient heart rate seemed fast and she was complaining of being dizzy.  Requested nurse check her out.  Found patient to be alert and responsive with a flat affect.  She said that she has had dizziness for some time.  Ambulating her from ultrasound to treatment room.  Walked with steady slow gait.  Said she was a little dizzy with walking.  Assisted her up on to examine table.  Patient said that she drove here today.  She has had one glass of water and one cola.  Has not had any recent changes in diet or medications.  No chest pain, no shortness of breath.   Orthostatic VS:  Lying 104/68 P 110  reg  Sitting 98/68 P 104  reg  Standing 95/66 P 112 reg Patient describes a little dizziness with position change.  Skin remains warm and dry and does not loose balance when going into standing position.  Discussed patients symptoms with Dr. Mare Ferrari   Instructed patient not to take Lorsartan today and starting tomorrow take 1/2 tablet. Writing instructions down patient said she could not see them any how.  Wrote with large font 18-24 Said she didn't know how to find [employer], she could not see well enough.  I told her that I would find someone to give her directions but I was concerned that she could not see well enough to drive herself home.  I asked her if I could find a ride for her.  She said," I'll be fine just pray for me".  Christine at check out who gave her directions as I drew a large map for her asked if she could call her daughter.  The patient said, she is at work, I don't know her cell phone number.  Altha Harm called her daughter and said her daughter was at work and two hours away from here.  The patient told me again she would be just fine, just pray for me.  I said I am worried about her but with her not being able to see I am concerned that she could kill someone else driving since she has trouble with her vision.   I asked her to sit and wait while I sought out administrative assistance with the problem.ibuprofen.  I found Arbie Cookey who helped me.  We went to registration who was going to call [place] and 9-1-1 to see what our options are to get her home.  The patient was willing to wait as long as it didn't take too long.  When Arbie Cookey and I came back to check out where the patient was sitting Alyse Low said the patient left.  Stated that the patient said, "I can't wait any longer".

## 2015-05-29 NOTE — Patient Instructions (Signed)
Do not take any Losartan today  Tomorrow start taking 0.5 mg daily  Increase fluid intake today  Keep your appointment with Dr. Johnsie Cancel as scheduled on August 2nd  Call for any problems, questions or concerns

## 2015-06-05 ENCOUNTER — Ambulatory Visit: Payer: Medicare Other | Admitting: Cardiovascular Disease

## 2015-06-05 NOTE — Telephone Encounter (Signed)
AWV scheduled for 06/20/15.

## 2015-06-09 ENCOUNTER — Inpatient Hospital Stay (HOSPITAL_COMMUNITY)
Admission: EM | Admit: 2015-06-09 | Discharge: 2015-06-18 | DRG: 536 | Disposition: A | Discharge status: Skilled Nursing Facility | Payer: Medicare Other | Attending: Internal Medicine | Admitting: Internal Medicine

## 2015-06-09 ENCOUNTER — Emergency Department (HOSPITAL_COMMUNITY): Payer: Medicare Other

## 2015-06-09 ENCOUNTER — Encounter (HOSPITAL_COMMUNITY): Payer: Self-pay

## 2015-06-09 DIAGNOSIS — R55 Syncope and collapse: Secondary | ICD-10-CM

## 2015-06-09 DIAGNOSIS — I509 Heart failure, unspecified: Secondary | ICD-10-CM

## 2015-06-09 DIAGNOSIS — Z79899 Other long term (current) drug therapy: Secondary | ICD-10-CM

## 2015-06-09 DIAGNOSIS — M47819 Spondylosis without myelopathy or radiculopathy, site unspecified: Secondary | ICD-10-CM

## 2015-06-09 DIAGNOSIS — Z853 Personal history of malignant neoplasm of breast: Secondary | ICD-10-CM

## 2015-06-09 DIAGNOSIS — S32501A Unspecified fracture of right pubis, initial encounter for closed fracture: Secondary | ICD-10-CM | POA: Diagnosis not present

## 2015-06-09 DIAGNOSIS — F329 Major depressive disorder, single episode, unspecified: Secondary | ICD-10-CM | POA: Diagnosis present

## 2015-06-09 DIAGNOSIS — I255 Ischemic cardiomyopathy: Secondary | ICD-10-CM | POA: Diagnosis present

## 2015-06-09 DIAGNOSIS — R42 Dizziness and giddiness: Secondary | ICD-10-CM | POA: Diagnosis not present

## 2015-06-09 DIAGNOSIS — I2510 Atherosclerotic heart disease of native coronary artery without angina pectoris: Secondary | ICD-10-CM | POA: Diagnosis not present

## 2015-06-09 DIAGNOSIS — S32599A Other specified fracture of unspecified pubis, initial encounter for closed fracture: Secondary | ICD-10-CM | POA: Diagnosis present

## 2015-06-09 DIAGNOSIS — E871 Hypo-osmolality and hyponatremia: Secondary | ICD-10-CM | POA: Diagnosis present

## 2015-06-09 DIAGNOSIS — I129 Hypertensive chronic kidney disease with stage 1 through stage 4 chronic kidney disease, or unspecified chronic kidney disease: Secondary | ICD-10-CM | POA: Diagnosis present

## 2015-06-09 DIAGNOSIS — I63239 Cerebral infarction due to unspecified occlusion or stenosis of unspecified carotid arteries: Secondary | ICD-10-CM | POA: Diagnosis present

## 2015-06-09 DIAGNOSIS — I252 Old myocardial infarction: Secondary | ICD-10-CM

## 2015-06-09 DIAGNOSIS — F419 Anxiety disorder, unspecified: Secondary | ICD-10-CM | POA: Diagnosis present

## 2015-06-09 DIAGNOSIS — W19XXXA Unspecified fall, initial encounter: Secondary | ICD-10-CM | POA: Diagnosis present

## 2015-06-09 DIAGNOSIS — Z7982 Long term (current) use of aspirin: Secondary | ICD-10-CM

## 2015-06-09 DIAGNOSIS — I959 Hypotension, unspecified: Secondary | ICD-10-CM

## 2015-06-09 DIAGNOSIS — E785 Hyperlipidemia, unspecified: Secondary | ICD-10-CM | POA: Diagnosis present

## 2015-06-09 DIAGNOSIS — E2740 Unspecified adrenocortical insufficiency: Secondary | ICD-10-CM | POA: Diagnosis present

## 2015-06-09 DIAGNOSIS — F17210 Nicotine dependence, cigarettes, uncomplicated: Secondary | ICD-10-CM | POA: Diagnosis present

## 2015-06-09 DIAGNOSIS — I6529 Occlusion and stenosis of unspecified carotid artery: Secondary | ICD-10-CM | POA: Diagnosis present

## 2015-06-09 DIAGNOSIS — N183 Chronic kidney disease, stage 3 (moderate): Secondary | ICD-10-CM | POA: Diagnosis present

## 2015-06-09 DIAGNOSIS — Y92000 Kitchen of unspecified non-institutional (private) residence as  the place of occurrence of the external cause: Secondary | ICD-10-CM

## 2015-06-09 DIAGNOSIS — Z7902 Long term (current) use of antithrombotics/antiplatelets: Secondary | ICD-10-CM

## 2015-06-09 DIAGNOSIS — Z96642 Presence of left artificial hip joint: Secondary | ICD-10-CM | POA: Diagnosis present

## 2015-06-09 DIAGNOSIS — S32591A Other specified fracture of right pubis, initial encounter for closed fracture: Secondary | ICD-10-CM | POA: Diagnosis not present

## 2015-06-09 DIAGNOSIS — Z955 Presence of coronary angioplasty implant and graft: Secondary | ICD-10-CM

## 2015-06-09 DIAGNOSIS — F341 Dysthymic disorder: Secondary | ICD-10-CM | POA: Diagnosis present

## 2015-06-09 DIAGNOSIS — I4891 Unspecified atrial fibrillation: Secondary | ICD-10-CM | POA: Diagnosis present

## 2015-06-09 DIAGNOSIS — I951 Orthostatic hypotension: Secondary | ICD-10-CM | POA: Diagnosis not present

## 2015-06-09 LAB — CBC WITH DIFFERENTIAL/PLATELET
BASOS ABS: 0 10*3/uL (ref 0.0–0.1)
Basophils Relative: 1 % (ref 0–1)
EOS PCT: 1 % (ref 0–5)
Eosinophils Absolute: 0.1 10*3/uL (ref 0.0–0.7)
HEMATOCRIT: 34.1 % — AB (ref 36.0–46.0)
Hemoglobin: 11.5 g/dL — ABNORMAL LOW (ref 12.0–15.0)
Lymphocytes Relative: 14 % (ref 12–46)
Lymphs Abs: 1.2 10*3/uL (ref 0.7–4.0)
MCH: 29 pg (ref 26.0–34.0)
MCHC: 33.7 g/dL (ref 30.0–36.0)
MCV: 86.1 fL (ref 78.0–100.0)
Monocytes Absolute: 0.8 10*3/uL (ref 0.1–1.0)
Monocytes Relative: 9 % (ref 3–12)
Neutro Abs: 6.1 10*3/uL (ref 1.7–7.7)
Neutrophils Relative %: 75 % (ref 43–77)
PLATELETS: 232 10*3/uL (ref 150–400)
RBC: 3.96 MIL/uL (ref 3.87–5.11)
RDW: 15 % (ref 11.5–15.5)
WBC: 8.2 10*3/uL (ref 4.0–10.5)

## 2015-06-09 LAB — TYPE AND SCREEN
ABO/RH(D): A POS
ANTIBODY SCREEN: NEGATIVE

## 2015-06-09 LAB — BASIC METABOLIC PANEL
Anion gap: 12 (ref 5–15)
BUN: 16 mg/dL (ref 6–20)
CO2: 21 mmol/L — AB (ref 22–32)
CREATININE: 1.92 mg/dL — AB (ref 0.44–1.00)
Calcium: 9 mg/dL (ref 8.9–10.3)
Chloride: 100 mmol/L — ABNORMAL LOW (ref 101–111)
GFR calc non Af Amer: 27 mL/min — ABNORMAL LOW (ref >60)
GFR, EST AFRICAN AMERICAN: 31 mL/min — AB (ref >60)
Glucose, Bld: 92 mg/dL (ref 65–99)
POTASSIUM: 4.2 mmol/L (ref 3.5–5.1)
SODIUM: 133 mmol/L — AB (ref 135–145)

## 2015-06-09 LAB — PROTIME-INR
INR: 0.98 (ref 0.00–1.49)
Prothrombin Time: 13.2 seconds (ref 11.6–15.2)

## 2015-06-09 MED ORDER — CLOPIDOGREL BISULFATE 75 MG PO TABS
75.0000 mg | ORAL_TABLET | Freq: Every day | ORAL | Status: DC
  Administered 2015-06-10 – 2015-06-18 (×9): 75 mg via ORAL
  Filled 2015-06-09 (×9): qty 1

## 2015-06-09 MED ORDER — SODIUM CHLORIDE 0.9 % IJ SOLN
3.0000 mL | Freq: Two times a day (BID) | INTRAMUSCULAR | Status: DC
  Administered 2015-06-10: 3 mL via INTRAVENOUS
  Administered 2015-06-10: 22:00:00 via INTRAVENOUS
  Administered 2015-06-11 – 2015-06-13 (×4): 3 mL via INTRAVENOUS

## 2015-06-09 MED ORDER — TRAMADOL HCL 50 MG PO TABS
50.0000 mg | ORAL_TABLET | Freq: Two times a day (BID) | ORAL | Status: DC | PRN
Start: 2015-06-09 — End: 2015-06-18
  Administered 2015-06-11 – 2015-06-18 (×7): 50 mg via ORAL
  Filled 2015-06-09 (×8): qty 1

## 2015-06-09 MED ORDER — CLONAZEPAM 1 MG PO TBDP: 2.0000 mg | ORAL_TABLET | Freq: Two times a day (BID) | ORAL | Status: DC

## 2015-06-09 MED ORDER — HYDROCODONE-ACETAMINOPHEN 5-325 MG PO TABS
1.0000 | ORAL_TABLET | ORAL | Status: DC | PRN
  Administered 2015-06-10 – 2015-06-14 (×15): 2 via ORAL
  Administered 2015-06-14: 1 via ORAL
  Administered 2015-06-14 – 2015-06-15 (×4): 2 via ORAL
  Administered 2015-06-15: 1 via ORAL
  Administered 2015-06-16 – 2015-06-18 (×10): 2 via ORAL
  Filled 2015-06-09 (×9): qty 2
  Filled 2015-06-09: qty 1
  Filled 2015-06-09 (×23): qty 2

## 2015-06-09 MED ORDER — CLONAZEPAM 1 MG PO TABS
4.0000 mg | ORAL_TABLET | Freq: Every day | ORAL | Status: DC
  Administered 2015-06-10 – 2015-06-18 (×9): 4 mg via ORAL
  Filled 2015-06-09 (×9): qty 4

## 2015-06-09 MED ORDER — SODIUM CHLORIDE 0.9 % IJ SOLN: 3.0000 mL | INTRAMUSCULAR | Status: DC | PRN

## 2015-06-09 MED ORDER — ASPIRIN 325 MG PO TABS
325.0000 mg | ORAL_TABLET | Freq: Every day | ORAL | Status: DC
  Administered 2015-06-10 – 2015-06-18 (×9): 325 mg via ORAL
  Filled 2015-06-09 (×9): qty 1

## 2015-06-09 MED ORDER — MORPHINE SULFATE 4 MG/ML IJ SOLN
4.0000 mg | INTRAMUSCULAR | Status: DC | PRN
  Administered 2015-06-10 (×3): 4 mg via INTRAVENOUS
  Filled 2015-06-09 (×3): qty 1

## 2015-06-09 MED ORDER — HEPARIN SODIUM (PORCINE) 5000 UNIT/ML IJ SOLN
5000.0000 [IU] | Freq: Three times a day (TID) | INTRAMUSCULAR | Status: DC
Start: 2015-06-09 — End: 2015-06-09

## 2015-06-09 MED ORDER — HEPARIN SODIUM (PORCINE) 5000 UNIT/ML IJ SOLN
5000.0000 [IU] | Freq: Three times a day (TID) | INTRAMUSCULAR | Status: DC
  Administered 2015-06-10 – 2015-06-16 (×20): 5000 [IU] via SUBCUTANEOUS
  Filled 2015-06-09 (×20): qty 1

## 2015-06-09 MED ORDER — SODIUM CHLORIDE 0.9 % IJ SOLN: 3.0000 mL | Freq: Two times a day (BID) | INTRAMUSCULAR | Status: DC

## 2015-06-09 MED ORDER — SODIUM CHLORIDE 0.9 % IV SOLN
250.0000 mL | INTRAVENOUS | Status: DC | PRN
  Administered 2015-06-13: 250 mL via INTRAVENOUS

## 2015-06-09 MED ORDER — OMEGA-3-ACID ETHYL ESTERS 1 G PO CAPS
1.0000 g | ORAL_CAPSULE | Freq: Every day | ORAL | Status: DC
  Administered 2015-06-10 – 2015-06-17 (×5): 1 g via ORAL
  Filled 2015-06-09 (×18): qty 1

## 2015-06-09 MED ORDER — CLONAZEPAM 1 MG PO TABS
2.0000 mg | ORAL_TABLET | Freq: Every day | ORAL | Status: DC
  Administered 2015-06-10 – 2015-06-17 (×9): 2 mg via ORAL
  Filled 2015-06-09 (×9): qty 2

## 2015-06-09 MED ORDER — ONDANSETRON HCL 4 MG/2ML IJ SOLN: 4.0000 mg | Freq: Three times a day (TID) | INTRAMUSCULAR | Status: DC | PRN

## 2015-06-09 MED ORDER — BLACK COHOSH 540 MG PO CAPS: 540.0000 mg | ORAL_CAPSULE | Freq: Two times a day (BID) | ORAL | Status: DC

## 2015-06-09 MED ORDER — ONDANSETRON HCL 4 MG/2ML IJ SOLN: 4.0000 mg | Freq: Once | INTRAMUSCULAR | Status: DC

## 2015-06-09 MED ORDER — HYDROCODONE-ACETAMINOPHEN 5-325 MG PO TABS: 1.0000 | ORAL_TABLET | ORAL | Status: DC | PRN

## 2015-06-09 MED ORDER — ONDANSETRON HCL 4 MG/2ML IJ SOLN
4.0000 mg | Freq: Once | INTRAMUSCULAR | Status: AC
  Administered 2015-06-09: 4 mg via INTRAVENOUS
  Filled 2015-06-09: qty 2

## 2015-06-09 MED ORDER — LOSARTAN POTASSIUM 25 MG PO TABS
12.5000 mg | ORAL_TABLET | Freq: Every day | ORAL | Status: DC
  Administered 2015-06-10: 12.5 mg via ORAL
  Filled 2015-06-09: qty 1

## 2015-06-09 MED ORDER — QUETIAPINE FUMARATE 400 MG PO TABS
400.0000 mg | ORAL_TABLET | Freq: Every day | ORAL | Status: DC
  Administered 2015-06-10 – 2015-06-16 (×7): 400 mg via ORAL
  Filled 2015-06-09 (×10): qty 1

## 2015-06-09 MED ORDER — CARVEDILOL 3.125 MG PO TABS
3.1250 mg | ORAL_TABLET | Freq: Two times a day (BID) | ORAL | Status: DC
  Administered 2015-06-10 – 2015-06-16 (×12): 3.125 mg via ORAL
  Filled 2015-06-09 (×15): qty 1

## 2015-06-09 MED ORDER — HYDROMORPHONE HCL 1 MG/ML IJ SOLN
0.5000 mg | INTRAMUSCULAR | Status: DC | PRN
  Administered 2015-06-09 (×4): 0.5 mg via INTRAVENOUS
  Filled 2015-06-09 (×3): qty 1

## 2015-06-09 MED ORDER — ONDANSETRON HCL 4 MG PO TABS: 4.0000 mg | ORAL_TABLET | Freq: Four times a day (QID) | ORAL | Status: DC | PRN

## 2015-06-09 MED ORDER — HYDROMORPHONE HCL 1 MG/ML IJ SOLN: 1.0000 mg | INTRAMUSCULAR | Status: DC | PRN

## 2015-06-09 MED ORDER — RISPERIDONE 0.5 MG PO TABS
1.0000 mg | ORAL_TABLET | Freq: Three times a day (TID) | ORAL | Status: DC
  Administered 2015-06-10: 0.5 mg via ORAL
  Administered 2015-06-10 – 2015-06-18 (×26): 1 mg via ORAL
  Filled 2015-06-09 (×28): qty 2

## 2015-06-09 MED ORDER — SENNOSIDES-DOCUSATE SODIUM 8.6-50 MG PO TABS
1.0000 | ORAL_TABLET | Freq: Every evening | ORAL | Status: DC | PRN
  Administered 2015-06-10 – 2015-06-16 (×3): 1 via ORAL
  Filled 2015-06-09 (×3): qty 1

## 2015-06-09 MED ORDER — ACETAMINOPHEN 650 MG RE SUPP: 650.0000 mg | Freq: Four times a day (QID) | RECTAL | Status: DC | PRN

## 2015-06-09 MED ORDER — ZOLPIDEM TARTRATE 5 MG PO TABS: 5.0000 mg | ORAL_TABLET | Freq: Every evening | ORAL | Status: DC | PRN

## 2015-06-09 MED ORDER — ONDANSETRON HCL 4 MG/2ML IJ SOLN: 4.0000 mg | Freq: Four times a day (QID) | INTRAMUSCULAR | Status: DC | PRN

## 2015-06-09 MED ORDER — ACETAMINOPHEN 325 MG PO TABS: 650.0000 mg | ORAL_TABLET | Freq: Four times a day (QID) | ORAL | Status: DC | PRN

## 2015-06-09 MED ORDER — METRONIDAZOLE 500 MG PO TABS: 500.0000 mg | ORAL_TABLET | Freq: Three times a day (TID) | ORAL | Status: DC

## 2015-06-09 NOTE — H&P (Addendum)
[facility]                                                                                    Patient Demographics  Caroline [name], is a 64 y.o. female  CSN: [id number]  MRN: [id number]  DOB - [birth year]  Admit Date - 06/09/2015  Outpatient Primary MD for the patient is [name], PA-C   With History of -  Past Medical History  Diagnosis Date  . GERD (gastroesophageal reflux disease)   . Dyslipidemia   . CAD (coronary artery disease)     [facility]  Diagonal instent restenosis treated 10/2005  . Myocardial infarction     3 stents 1st 2004, has had several  . Heart murmur     states years ago  . Shortness of breath     with exertion  . Bright disease   . Cancer     right breast Feb 28, 2001  . Anxiety   . Arthritis     back  . CHF (congestive heart failure)     states she was told se had this in past by MD  . Angina   . Hypertension     states a long time ago-states she never took medication  . Depression     February 28, 2002 had shock treatment due to depresson, spouse passed away 2000/02/29  . Carotid artery occlusion   . Atrial fibrillation   . Grave's disease     pt denies      Past Surgical History  Procedure Laterality Date  . Breast lumpectomy      right breast   . Coronary angioplasty with stent placement    . Joint replacement  2010-02-28    Left total hip replacement  . Abdominal hysterectomy      partial  . Mouth surgery    . Endarterectomy  01/08/2012    Procedure: ENDARTERECTOMY CAROTID;  Surgeon: Mal Misty, MD;  Location: [facility];  Service: Vascular;  Laterality: Left;  with Dacron patch angioplasty  . Carotid endarterectomy  01/08/12    Left  . Fracture surgery  February 28, 2006    Broken Back    in for   Chief Complaint  Patient presents with  . Fall     HPI  Caroline [name]  is a 64 y.o. female, with past medical history significant for coronary artery disease status post MI and stent placement, cardiomyopathy and carotid occlusion, presenting  today status post a fall with right hip pain that happened yesterday. Patient became dizzy and fell. Patient has history of dizziness and being followed by Dr. Flonnie Overman from cardiology. Patient denies any preceding chest pains , palpitations or shortness of breath .    Review of Systems    In addition to the HPI above,  No Fever-chills, No Headache, No changes with Vision or hearing, No problems swallowing food or Liquids, No Chest pain, Cough or Shortness of Breath, No Abdominal pain, No Nausea or Vommitting, Bowel movements are regular, No Blood in stool or Urine, No dysuria, No new skin rashes or bruises, Right hip pain noted, No recent weight gain or loss, No polyuria, polydypsia or polyphagia, No  significant Mental Stressors.  A full 10 point Review of Systems was done, except as stated above, all other Review of Systems were negative.   Social History History  Substance Use Topics  . Smoking status: Light Tobacco Smoker -- 0.50 packs/day for 40 years    Types: Cigarettes  . Smokeless tobacco: Never Used  . Alcohol Use: No    Family History Family History  Problem Relation Age of Onset  . Coronary artery disease Father   . Prostate cancer Father     not sure  . Pancreatic cancer Father     not sure  . Cancer Father     Pancreatic and  Prostate  . Heart disease Father     Before age 19 and BPG  . Hyperlipidemia Father   . Heart attack Father   . Hypertension Mother   . Diabetes Sister   . Colon cancer Neg Hx      Prior to Admission medications   Medication Sig Start Date End Date Taking? Authorizing Provider  aspirin 325 MG tablet Take 325 mg by mouth daily.   Yes Historical Provider, MD  Black Cohosh 540 MG CAPS Take 540 mg by mouth 2 (two) times daily.   Yes Historical Provider, MD  carvedilol (COREG) 3.125 MG tablet Take 1 tablet (3.125 mg total) by mouth 2 (two) times daily. 01/23/15  Yes Josue Hector, MD  clonazepam (KLONOPIN) 2 MG disintegrating  tablet Take 2-4 mg by mouth 2 (two) times daily. Takes 4mg  in am and 2mg  in pm   Yes Historical Provider, MD  clopidogrel (PLAVIX) 75 MG tablet Take 1 tablet (75 mg total) by mouth daily. 01/23/15  Yes Josue Hector, MD  fish oil-omega-3 fatty acids 1000 MG capsule Take 1 g by mouth daily.    Yes Historical Provider, MD  losartan (COZAAR) 25 MG tablet Take 0.5 tablets (12.5 mg total) by mouth daily. 05/30/15  Yes Darlin Coco, MD  QUEtiapine (SEROQUEL) 400 MG tablet Take 400 mg by mouth at bedtime.    Yes Historical Provider, MD  risperiDONE (RISPERDAL) 2 MG tablet Take 1 mg by mouth 3 (three) times daily.   Yes Historical Provider, MD  traMADol (ULTRAM) 50 MG tablet Take 1 tablet (50 mg total) by mouth every 12 (twelve) hours as needed for severe pain. 03/21/15  Yes Brunetta Jeans, PA-C  metroNIDAZOLE (FLAGYL) 500 MG tablet Take 1 tablet (500 mg total) by mouth 3 (three) times daily. 03/29/15   Brunetta Jeans, PA-C    No Active Allergies  Physical Exam  Vitals  Blood pressure 115/71, pulse 92, temperature 99 F (37.2 C), temperature source Oral, resp. rate 16, height 5\' 3"  (1.6 m), weight 49.442 kg (109 lb), SpO2 99 %.   1. General elderly female, very pleasant and pain  2. Normal affect and insight, Not Suicidal or Homicidal, Awake Alert, Oriented X 3.  3. No F.N deficits, grossly. ALL C.Nerves Intact,   4. Ears and Eyes appear Normal, Conjunctivae clear, PERRLA. Moist Oral Mucosa.  5. Supple Neck, No JVD, No cervical lymphadenopathy appriciated, No Carotid Bruits.  6. Symmetrical Chest wall movement, Good air movement bilaterally, CTAB.  7. RRR, No Gallops, systolic murmur 3/6, No Parasternal Heave.  8. Positive Bowel Sounds, Abdomen Soft, Non tender, No organomegaly appriciated,No rebound -guarding or rigidity.  9.  No Cyanosis, Normal Skin Turgor, No Skin Rash or Bruise.  10. Severe right hip tenderness and pain upon movement    Data Review  CBC  Recent  Labs Lab 06/09/15 1850  WBC 8.2  HGB 11.5*  HCT 34.1*  PLT 232  MCV 86.1  MCH 29.0  MCHC 33.7  RDW 15.0  LYMPHSABS 1.2  MONOABS 0.8  EOSABS 0.1  BASOSABS 0.0   ------------------------------------------------------------------------------------------------------------------  Chemistries   Recent Labs Lab 06/09/15 1850  NA 133*  K 4.2  CL 100*  CO2 21*  GLUCOSE 92  BUN 16  CREATININE 1.92*  CALCIUM 9.0   ------------------------------------------------------------------------------------------------------------------ estimated creatinine clearance is 23.7 mL/min (by C-G formula based on Cr of 1.92). ------------------------------------------------------------------------------------------------------------------ No results for input(s): TSH, T4TOTAL, T3FREE, THYROIDAB in the last 72 hours.  Invalid input(s): FREET3   Coagulation profile  Recent Labs Lab 06/09/15 1850  INR 0.98   ------------------------------------------------------------------------------------------------------------------- No results for input(s): DDIMER in the last 72 hours. -------------------------------------------------------------------------------------------------------------------  Cardiac Enzymes No results for input(s): CKMB, TROPONINI, MYOGLOBIN in the last 168 hours.  Invalid input(s): CK ------------------------------------------------------------------------------------------------------------------ Invalid input(s): POCBNP   ---------------------------------------------------------------------------------------------------------------  Urinalysis    Component Value Date/Time   COLORURINE YELLOW 04/17/2012 [place] 04/17/2012 1659   LABSPEC 1.011 04/17/2012 1659   PHURINE 6.0 04/17/2012 1659   GLUCOSEU NEGATIVE 04/17/2012 1659   HGBUR NEGATIVE 04/17/2012 1659   BILIRUBINUR NEGATIVE 04/17/2012 [place] 04/17/2012 1659   PROTEINUR  NEGATIVE 04/17/2012 1659   UROBILINOGEN 0.2 04/17/2012 1659   NITRITE NEGATIVE 04/17/2012 1659   LEUKOCYTESUR NEGATIVE 04/17/2012 1659    ----------------------------------------------------------------------------------------------------------------  Imaging results:   Ct Head Wo Contrast  06/09/2015   CLINICAL DATA:  Became dizzy and fell yesterday striking head, on Plavix, history breast cancer 2002, CHF, hypertension, coronary artery disease post MI, atrial fibrillation, carotid occlusion  EXAM: CT HEAD WITHOUT CONTRAST  TECHNIQUE: Contiguous axial images were obtained from the base of the skull through the vertex without intravenous contrast.  COMPARISON:  05/17/2014  FINDINGS: Generalized atrophy.  Normal ventricular morphology.  No midline shift or mass effect.  Small vessel chronic ischemic changes of deep cerebral white matter.  No intracranial hemorrhage, mass lesion, or acute infarction.  Visualized paranasal sinuses and mastoid air cells clear.  Bones unremarkable.  Atherosclerotic calcifications of internal carotid and vertebral arteries at skullbase.  IMPRESSION: Atrophy with small vessel chronic ischemic changes of deep cerebral white matter.  No acute intracranial abnormalities.   Electronically Signed   By: Lavonia Dana M.D.   On: 06/09/2015 19:53   Dg Hip Unilat With Pelvis 2-3 Views Right  06/09/2015   CLINICAL DATA:  64 year old female with a history of dizziness  EXAM: DG HIP (WITH OR WITHOUT PELVIS) 2-3V RIGHT  COMPARISON:  None.  FINDINGS: Diffuse osteopenia.  Fracture of the inferior right pubic ramus.  Left hip arthroplasty.  Degenerative changes of the lumbar spine.  Vascular calcifications.  Unremarkable appearance of the proximal right femur.  IMPRESSION: Nondisplaced fracture of the right inferior pubic ramus.  Surgical changes of left hip arthroplasty.  Osteopenia.  Atherosclerosis.  Signed,  Dulcy Fanny. Earleen Newport, DO  [facility]  [facility]  Radiology   Electronically Signed   By: Corrie Mckusick D.O.   On: 06/09/2015 20:58    My personal review of EKG: Rhythm NSR, at 84 bpm with short PR, left axis deviation, nonspecific T changes and prolonged QT    Assessment & Plan  1. Syncope with fall 2. Pubic ramus fracture 3. History of coronary artery disease and ischemic cardiomyopathy ejection fraction 30-35 percent last echo on 7/15 4. Chronic  renal failure, her creatinine has improved actually from 5/18  Plan  Pain control Rule out MI   DVT Prophylaxis heparin  AM Labs Ordered, also please review Full Orders  Code Status full  Disposition Plan: Home  Time spent in minutes : 36 minutes  Condition GUARDED   @SIGNATURE @

## 2015-06-09 NOTE — ED Provider Notes (Signed)
CSN: [id number]     Arrival date & time 06/09/15  1747 History   First MD Initiated Contact with Patient 06/09/15 1749     Chief Complaint  Patient presents with  . Fall     (Consider location/radiation/quality/duration/timing/severity/associated sxs/prior Treatment) HPI   CG is a 65 yo female with a PMH of HTN, ischemic cardiomyopathy, carotid artery occlusion who presents after a fall last night. Patient has had intermittent dizziness for months and states she was dizzy last night prior to the fall. She states she hit her head on a kitchen cabinet and landed on her right hip. She has been unable to ambulate since the fall and has been crawling around on her hands and knees. Her pain is located in her right groin and is aggravated by movement of either leg. She has tried 2 tramadol without relief. Patient denies fever, LOC, headache, N/T, N/V, SOB, urinary symptoms, palpitations or earlier difficulties ambulating.    Past Medical History  Diagnosis Date  . GERD (gastroesophageal reflux disease)   . Dyslipidemia   . CAD (coronary artery disease)     [facility]  Diagonal instent restenosis treated 10/2005  . Myocardial infarction     3 stents 1st 2004, has had several  . Heart murmur     states years ago  . Shortness of breath     with exertion  . Bright disease   . Cancer     right breast 04-Feb-2001  . Anxiety   . Arthritis     back  . CHF (congestive heart failure)     states she was told se had this in past by MD  . Angina   . Hypertension     states a long time ago-states she never took medication  . Depression     02/04/02 had shock treatment due to depresson, spouse passed away 05-Feb-2000  . Carotid artery occlusion   . Atrial fibrillation   . Grave's disease     pt denies   Past Surgical History  Procedure Laterality Date  . Breast lumpectomy      right breast   . Coronary angioplasty with stent placement    . Joint replacement  04-Feb-2010    Left total hip replacement  .  Abdominal hysterectomy      partial  . Mouth surgery    . Endarterectomy  01/08/2012    Procedure: ENDARTERECTOMY CAROTID;  Surgeon: Mal Misty, MD;  Location: [facility];  Service: Vascular;  Laterality: Left;  with Dacron patch angioplasty  . Carotid endarterectomy  01/08/12    Left  . Fracture surgery  04-Feb-2006    Broken Back   Family History  Problem Relation Age of Onset  . Coronary artery disease Father   . Prostate cancer Father     not sure  . Pancreatic cancer Father     not sure  . Cancer Father     Pancreatic and  Prostate  . Heart disease Father     Before age 67 and BPG  . Hyperlipidemia Father   . Heart attack Father   . Hypertension Mother   . Diabetes Sister   . Colon cancer Neg Hx    History  Substance Use Topics  . Smoking status: Light Tobacco Smoker -- 0.50 packs/day for 40 years    Types: Cigarettes  . Smokeless tobacco: Never Used  . Alcohol Use: No   OB History    No data available  Review of Systems  10 Systems reviewed and are negative for acute change except as noted in the HPI.    Allergies  Review of patient's allergies indicates no active allergies.  Home Medications   Prior to Admission medications   Medication Sig Start Date End Date Taking? Authorizing Provider  aspirin 325 MG tablet Take 325 mg by mouth daily.   Yes Historical Provider, MD  Black Cohosh 540 MG CAPS Take 540 mg by mouth 2 (two) times daily.   Yes Historical Provider, MD  carvedilol (COREG) 3.125 MG tablet Take 1 tablet (3.125 mg total) by mouth 2 (two) times daily. 01/23/15  Yes Josue Hector, MD  clonazepam (KLONOPIN) 2 MG disintegrating tablet Take 2-4 mg by mouth 2 (two) times daily. Takes 4mg  in am and 2mg  in pm   Yes Historical Provider, MD  clopidogrel (PLAVIX) 75 MG tablet Take 1 tablet (75 mg total) by mouth daily. 01/23/15  Yes Josue Hector, MD  fish oil-omega-3 fatty acids 1000 MG capsule Take 1 g by mouth daily.    Yes Historical Provider, MD  losartan  (COZAAR) 25 MG tablet Take 0.5 tablets (12.5 mg total) by mouth daily. 05/30/15  Yes Darlin Coco, MD  QUEtiapine (SEROQUEL) 400 MG tablet Take 400 mg by mouth at bedtime.    Yes Historical Provider, MD  risperiDONE (RISPERDAL) 2 MG tablet Take 1 mg by mouth 3 (three) times daily.   Yes Historical Provider, MD  traMADol (ULTRAM) 50 MG tablet Take 1 tablet (50 mg total) by mouth every 12 (twelve) hours as needed for severe pain. 03/21/15  Yes Brunetta Jeans, PA-C  metroNIDAZOLE (FLAGYL) 500 MG tablet Take 1 tablet (500 mg total) by mouth 3 (three) times daily. 03/29/15   Brunetta Jeans, PA-C   BP 113/61 mmHg  Pulse 84  Temp(Src) 98.4 F (36.9 C)  Resp 15  Ht 5\' 3"  (1.6 m)  Wt 115 lb (52.164 kg)  BMI 20.38 kg/m2  SpO2 97% Physical Exam  Constitutional: She appears well-developed and well-nourished. No distress.  HENT:  Head: Normocephalic and atraumatic.  Eyes: Pupils are equal, round, and reactive to light.  Neck: Normal range of motion. Neck supple.  Cardiovascular: Normal rate and regular rhythm.   Pulmonary/Chest: Effort normal.  Abdominal: Soft.  Musculoskeletal:       Right hip: She exhibits decreased range of motion, decreased strength, tenderness and bony tenderness. She exhibits no swelling, no crepitus, no deformity and no laceration.       Left hip: She exhibits decreased range of motion (patient has pain to right hip with ROM of left hip).  Pt could not tolerate trying ROM of right hip due to pain. Denies pain to knee or ankle. NVI to bilateral feet.  Neurological: She is alert.  Cranial nerves II-VIII and X-XII evaluated and show no deficits.Upper and lower extremity strength is symmetrical and physiologic Normal muscular tone No facial droop Coordination intact, no limb ataxia, finger-nose-finger normal    Skin: Skin is warm and dry.  Nursing note and vitals reviewed.   ED Course  Procedures (including critical care time) Labs Review Labs Reviewed  BASIC  METABOLIC PANEL - Abnormal; Notable for the following:    Sodium 133 (*)    Chloride 100 (*)    CO2 21 (*)    Creatinine, Ser 1.92 (*)    GFR calc non Af Amer 27 (*)    GFR calc Af Amer 31 (*)    All other  components within normal limits  CBC WITH DIFFERENTIAL/PLATELET - Abnormal; Notable for the following:    Hemoglobin 11.5 (*)    HCT 34.1 (*)    All other components within normal limits  PROTIME-INR  TYPE AND SCREEN    Imaging Review Ct Head Wo Contrast  06/09/2015   CLINICAL DATA:  Became dizzy and fell yesterday striking head, on Plavix, history breast cancer 2002, CHF, hypertension, coronary artery disease post MI, atrial fibrillation, carotid occlusion  EXAM: CT HEAD WITHOUT CONTRAST  TECHNIQUE: Contiguous axial images were obtained from the base of the skull through the vertex without intravenous contrast.  COMPARISON:  05/17/2014  FINDINGS: Generalized atrophy.  Normal ventricular morphology.  No midline shift or mass effect.  Small vessel chronic ischemic changes of deep cerebral white matter.  No intracranial hemorrhage, mass lesion, or acute infarction.  Visualized paranasal sinuses and mastoid air cells clear.  Bones unremarkable.  Atherosclerotic calcifications of internal carotid and vertebral arteries at skullbase.  IMPRESSION: Atrophy with small vessel chronic ischemic changes of deep cerebral white matter.  No acute intracranial abnormalities.   Electronically Signed   By: Lavonia Dana M.D.   On: 06/09/2015 19:53   Dg Hip Unilat With Pelvis 2-3 Views Right  06/09/2015   CLINICAL DATA:  64 year old female with a history of dizziness  EXAM: DG HIP (WITH OR WITHOUT PELVIS) 2-3V RIGHT  COMPARISON:  None.  FINDINGS: Diffuse osteopenia.  Fracture of the inferior right pubic ramus.  Left hip arthroplasty.  Degenerative changes of the lumbar spine.  Vascular calcifications.  Unremarkable appearance of the proximal right femur.  IMPRESSION: Nondisplaced fracture of the right inferior pubic  ramus.  Surgical changes of left hip arthroplasty.  Osteopenia.  Atherosclerosis.  Signed,  Dulcy Fanny. Earleen Newport, DO  [facility]  [facility]   Electronically Signed   By: Corrie Mckusick D.O.   On: 06/09/2015 20:58     EKG Interpretation   Date/Time:  Saturday June 09 2015 17:57:39 EDT Ventricular Rate:  84 PR Interval:  76 QRS Duration: 98 QT Interval:  612 QTC Calculation: 724 R Axis:   -23 Text Interpretation:  Sinus rhythm Short PR interval Borderline left axis  deviation Borderline low voltage, extremity leads Nonspecific T  abnormalities, lateral leads Prolonged QT interval No significant change  since last tracing Confirmed by Mingo Amber  MD, [place] ([id number]) on 06/09/2015  6:52:20 PM      MDM   Final diagnoses:  Dizziness  Fall, initial encounter  Pubic ramus fracture, right, closed, initial encounter   BMP, CBC, PT/INR, type and screen- not acute Normal head CT Patient has a none acute pubic ramus fracture to the right -- she lives alone and is unable to ambulate. Her daughter will be here tomorrow lunch or evening and then the patient can use a walker that she has at home and bedside commode and this is preferable but since her daughter won't be here till tomorrow the patient will require admission for obs by [facility]. Patient is agreeable to this. Dr. Lorin Mercy will see patient in hospital tomorrow. Temp admit orders placed.  Filed Vitals:   06/09/15 2130  BP: 120/66  Pulse: 92  Temp:   Resp: 10      Delos Haring, PA-C 06/09/15 2135  Evelina Bucy, MD 06/09/15 2155

## 2015-06-09 NOTE — ED Notes (Signed)
Patient transported to CT 

## 2015-06-09 NOTE — ED Notes (Signed)
Pt c/o dizziness for several months, had a fall yesterday and fell onto right hip and is primarily complaining of right groin pain and difficulty walking.  No deformity or asymmetry noted.  When EMS arrived to pt's house, she was sitting up, was diaphoretic and her BP was 80/56.  She has had 526mL of NS since then and denies current dizziness.  Loss of appetite for the past few days as well.  Denies chest pain, denies shortness of breath.

## 2015-06-10 DIAGNOSIS — S32501A Unspecified fracture of right pubis, initial encounter for closed fracture: Secondary | ICD-10-CM | POA: Diagnosis not present

## 2015-06-10 DIAGNOSIS — R42 Dizziness and giddiness: Secondary | ICD-10-CM | POA: Diagnosis not present

## 2015-06-10 DIAGNOSIS — I255 Ischemic cardiomyopathy: Secondary | ICD-10-CM | POA: Diagnosis not present

## 2015-06-10 LAB — TROPONIN I
Troponin I: <0.03 ng/mL (ref <0.031)
Troponin I: <0.03 ng/mL (ref <0.031)

## 2015-06-10 MED ORDER — CALCIUM CARBONATE ANTACID 500 MG PO CHEW
1.0000 | CHEWABLE_TABLET | Freq: Two times a day (BID) | ORAL | Status: DC
  Administered 2015-06-10 – 2015-06-18 (×17): 200 mg via ORAL
  Filled 2015-06-10 (×17): qty 1

## 2015-06-10 NOTE — Consult Note (Signed)
Reason for Consult:right inferior pubic rami fracture Referring Physician: Darrick Meigs MD  Caroline [name] is an 64 y.o. female.  HPI: 64 year old female with fall she complained of dizziness. She does have history of coronary artery disease and ischemic cardiomyopathy with ejection fraction low at 30-35%. Patient's had previous left hip hemiarthroplasty. She complains of right groin pain after fall. Pain with lifting her leg and motion of her right lower extremity. No bowel or bladder symptoms no fever or chills. Patient lives alone she has a daughter who works as a friend who stays with her. This is a closed fracture.  Past Medical History  Diagnosis Date  . GERD (gastroesophageal reflux disease)   . Dyslipidemia   . CAD (coronary artery disease)     [facility]  Diagonal instent restenosis treated 10/2005  . Myocardial infarction     3 stents 1st 2004, has had several  . Heart murmur     states years ago  . Shortness of breath     with exertion  . Bright disease   . Cancer     right breast 2000/12/21  . Anxiety   . Arthritis     back  . CHF (congestive heart failure)     states she was told se had this in past by MD  . Angina   . Hypertension     states a long time ago-states she never took medication  . Depression     12-21-01 had shock treatment due to depresson, spouse passed away December 22, 1999  . Carotid artery occlusion   . Atrial fibrillation   . Grave's disease     pt denies    Past Surgical History  Procedure Laterality Date  . Breast lumpectomy      right breast   . Coronary angioplasty with stent placement    . Joint replacement  12/21/09    Left total hip replacement  . Abdominal hysterectomy      partial  . Mouth surgery    . Endarterectomy  01/08/2012    Procedure: ENDARTERECTOMY CAROTID;  Surgeon: Mal Misty, MD;  Location: [facility];  Service: Vascular;  Laterality: Left;  with Dacron patch angioplasty  . Carotid endarterectomy  01/08/12    Left  . Fracture surgery  December 21, 2005     Broken Back    Family History  Problem Relation Age of Onset  . Coronary artery disease Father   . Prostate cancer Father     not sure  . Pancreatic cancer Father     not sure  . Cancer Father     Pancreatic and  Prostate  . Heart disease Father     Before age 98 and BPG  . Hyperlipidemia Father   . Heart attack Father   . Hypertension Mother   . Diabetes Sister   . Colon cancer Neg Hx     Social History:  reports that she has been smoking Cigarettes.  She has a 20 pack-year smoking history. She has never used smokeless tobacco. She reports that she does not drink alcohol or use illicit drugs.  Allergies: No Active Allergies  Medications: I have reviewed the patient's current medications.  Results for orders placed or performed during the hospital encounter of 06/09/15 (from the past 48 hour(s))  Type and screen     Status: None   Collection Time: 06/09/15  6:47 PM  Result Value Ref Range   ABO/RH(D) A POS    Antibody Screen NEG  Sample Expiration 92/09/9416   Basic metabolic panel     Status: Abnormal   Collection Time: 06/09/15  6:50 PM  Result Value Ref Range   Sodium 133 (L) 135 - 145 mmol/L   Potassium 4.2 3.5 - 5.1 mmol/L   Chloride 100 (L) 101 - 111 mmol/L   CO2 21 (L) 22 - 32 mmol/L   Glucose, Bld 92 65 - 99 mg/dL   BUN 16 6 - 20 mg/dL   Creatinine, Ser 1.92 (H) 0.44 - 1.00 mg/dL   Calcium 9.0 8.9 - 10.3 mg/dL   GFR calc non Af Amer 27 (L) >60 mL/min   GFR calc Af Amer 31 (L) >60 mL/min    Comment: (NOTE) The eGFR has been calculated using the CKD EPI equation. This calculation has not been validated in all clinical situations. eGFR's persistently <60 mL/min signify possible Chronic Kidney Disease.    Anion gap 12 5 - 15  CBC WITH DIFFERENTIAL     Status: Abnormal   Collection Time: 06/09/15  6:50 PM  Result Value Ref Range   WBC 8.2 4.0 - 10.5 K/uL   RBC 3.96 3.87 - 5.11 MIL/uL   Hemoglobin 11.5 (L) 12.0 - 15.0 g/dL   HCT 34.1 (L) 36.0 - 46.0 %    MCV 86.1 78.0 - 100.0 fL   MCH 29.0 26.0 - 34.0 pg   MCHC 33.7 30.0 - 36.0 g/dL   RDW 15.0 11.5 - 15.5 %   Platelets 232 150 - 400 K/uL   Neutrophils Relative % 75 43 - 77 %   Neutro Abs 6.1 1.7 - 7.7 K/uL   Lymphocytes Relative 14 12 - 46 %   Lymphs Abs 1.2 0.7 - 4.0 K/uL   Monocytes Relative 9 3 - 12 %   Monocytes Absolute 0.8 0.1 - 1.0 K/uL   Eosinophils Relative 1 0 - 5 %   Eosinophils Absolute 0.1 0.0 - 0.7 K/uL   Basophils Relative 1 0 - 1 %   Basophils Absolute 0.0 0.0 - 0.1 K/uL  Protime-INR     Status: None   Collection Time: 06/09/15  6:50 PM  Result Value Ref Range   Prothrombin Time 13.2 11.6 - 15.2 seconds   INR 0.98 0.00 - 1.49  Troponin I (q 6hr x 3)     Status: None   Collection Time: 06/10/15 12:01 AM  Result Value Ref Range   Troponin I <0.03 <0.031 ng/mL    Comment:        NO INDICATION OF MYOCARDIAL INJURY.   Troponin I (q 6hr x 3)     Status: None   Collection Time: 06/10/15  5:34 AM  Result Value Ref Range   Troponin I <0.03 <0.031 ng/mL    Comment:        NO INDICATION OF MYOCARDIAL INJURY.   Troponin I (q 6hr x 3)     Status: None   Collection Time: 06/10/15 12:10 PM  Result Value Ref Range   Troponin I <0.03 <0.031 ng/mL    Comment:        NO INDICATION OF MYOCARDIAL INJURY.     Ct Head Wo Contrast  06/09/2015   CLINICAL DATA:  Became dizzy and fell yesterday striking head, on Plavix, history breast cancer 2002, CHF, hypertension, coronary artery disease post MI, atrial fibrillation, carotid occlusion  EXAM: CT HEAD WITHOUT CONTRAST  TECHNIQUE: Contiguous axial images were obtained from the base of the skull through the vertex without intravenous contrast.  COMPARISON:  05/17/2014  FINDINGS: Generalized atrophy.  Normal ventricular morphology.  No midline shift or mass effect.  Small vessel chronic ischemic changes of deep cerebral white matter.  No intracranial hemorrhage, mass lesion, or acute infarction.  Visualized paranasal sinuses and  mastoid air cells clear.  Bones unremarkable.  Atherosclerotic calcifications of internal carotid and vertebral arteries at skullbase.  IMPRESSION: Atrophy with small vessel chronic ischemic changes of deep cerebral white matter.  No acute intracranial abnormalities.   Electronically Signed   By: Lavonia Dana M.D.   On: 06/09/2015 19:53   Dg Hip Unilat With Pelvis 2-3 Views Right  06/09/2015   CLINICAL DATA:  64 year old female with a history of dizziness  EXAM: DG HIP (WITH OR WITHOUT PELVIS) 2-3V RIGHT  COMPARISON:  None.  FINDINGS: Diffuse osteopenia.  Fracture of the inferior right pubic ramus.  Left hip arthroplasty.  Degenerative changes of the lumbar spine.  Vascular calcifications.  Unremarkable appearance of the proximal right femur.  IMPRESSION: Nondisplaced fracture of the right inferior pubic ramus.  Surgical changes of left hip arthroplasty.  Osteopenia.  Atherosclerosis.  Signed,  Dulcy Fanny. Earleen Newport, DO  [facility]  [facility]   Electronically Signed   By: Corrie Mckusick D.O.   On: 06/09/2015 20:58    ROS Blood pressure 116/51, pulse 86, temperature 97.8 F (36.6 C), temperature source Oral, resp. rate 16, height _0  (1.6 m), weight 49.442 kg (109 lb), SpO2 100 %. Physical Exam  Constitutional: She is oriented to person, place, and time.  Thin, frail apprearing. Conversant, pleasant O times 3.  In moderate pain  HENT:  Head: Normocephalic and atraumatic.  Neck: Normal range of motion. Neck supple.  Cardiovascular: Normal rate, regular rhythm and normal heart sounds.   Respiratory: Effort normal and breath sounds normal.  GI: Soft. Bowel sounds are normal.  Musculoskeletal:  Pain with right leg straight leg raising. Sciatic function is intact ankle dorsiflexion plantar flexion is normal. She has pain with straight leg raising right lower extremity. Complains of groin pain  Neurological: She is alert and oriented to person, place, and time.   Skin: Skin is warm.  Psychiatric: She has a normal mood and affect. Her behavior is normal. Thought content normal.    Assessment/Plan: Right inferior pubic rami fracture. Ischemic cardiomyopathy and history of coronary artery disease. She will require slow progressive physical therapy mobilization. Patient does not want to go to rehabilitation she has a friend who she states could stay with her at the house and a daughter who works during the day but is available in the evenings. I discussed with her that she will have to demonstrate that she can safely ambulate and be a low fall risk before therapy will feel that she is safe. Continue therapy will be glad to follow with you and follow her up after discharge in my office as an outpatient. Phone [phone]  Marybelle Killings 06/10/2015, 4:34 PM

## 2015-06-10 NOTE — Progress Notes (Signed)
CSW approached by Dr. Darrick Meigs who questioned if patient could return home via EMS when d/c'd from the hospital.  CSW explained that if she cannot safely be transported by car or other means of transportation- then EMS can be arranged but there is no guarantee that it will be covered by patient's insurance. He verbalized understanding. Patient is not stable for d/c today.  Weekday CSW will monitor for transportation needs.  Lorie Phenix. Pauline Good, [place]

## 2015-06-10 NOTE — Progress Notes (Signed)
[facility] PROGRESS NOTE  Caroline [name] OZH:[id number] DOB: 03-31-1951 DOA: 06/09/2015 PCP: [name], PA-C  Assessment/Plan: 1. Fall- patient denies passing out, but complains of dizziness. Will check orthostatic vital signs, 2-D echocardiogram.  2. History of CAD and ischemic cardio myopathy- patient's EF 30-35% on last echo from 05/18/2015. Will repeat echocardiogram. Continue Plavix. Continue Coreg, losartan. 3. Pubic ramus fracture- pain control, orthopedics Dr. Lorin Mercy was consulted by the ED physician. Ortho  to see the patient today 4. DVT prophylaxis- heparin  Code Status: Full code Family Communication: No family at bedside Disposition Plan: Home when medically stable   Consultants:  Orthopedics  Procedures:  None  Antibiotics:  None  HPI/Subjective: 64 y.o. female, with past medical history significant for coronary artery disease status post MI and stent placement, cardiomyopathy and carotid occlusion, presenting today status post a fall with right hip pain that happened yesterday. Patient became dizzy and fell. Patient has history of dizziness and being followed by Dr. Flonnie Overman from cardiology. Patient denies any preceding chest pains , palpitations or shortness of breath. Patient denies any chest pain or shortness of breath. Has been walking with the help of walker  Objective: Filed Vitals:   06/10/15 0432  BP: 116/51  Pulse: 86  Temp: 97.8 F (36.6 C)  Resp: 16    Intake/Output Summary (Last 24 hours) at 06/10/15 1408 Last data filed at 06/10/15 0900  Gross per 24 hour  Intake    240 ml  Output    200 ml  Net     40 ml   Filed Weights   06/09/15 1756 06/09/15 2242  Weight: 52.164 kg (115 lb) 49.442 kg (109 lb)    Exam:   General:  Appears in no acute distress  Cardiovascular: S1-S2 regular  Respiratory: Clear to auscultation bilaterally  Abdomen: Soft, nontender, no organomegaly  Musculoskeletal: No edema of the lower  extremities noted   Data Reviewed: Basic Metabolic Panel:  Recent Labs Lab 06/09/15 1850  NA 133*  K 4.2  CL 100*  CO2 21*  GLUCOSE 92  BUN 16  CREATININE 1.92*  CALCIUM 9.0   CBC:  Recent Labs Lab 06/09/15 1850  WBC 8.2  NEUTROABS 6.1  HGB 11.5*  HCT 34.1*  MCV 86.1  PLT 232   Cardiac Enzymes:  Recent Labs Lab 06/10/15 0001 06/10/15 0534 06/10/15 1210  TROPONINI <0.03 <0.03 <0.03   Studies: Ct Head Wo Contrast  06/09/2015   CLINICAL DATA:  Became dizzy and fell yesterday striking head, on Plavix, history breast cancer 2002, CHF, hypertension, coronary artery disease post MI, atrial fibrillation, carotid occlusion  EXAM: CT HEAD WITHOUT CONTRAST  TECHNIQUE: Contiguous axial images were obtained from the base of the skull through the vertex without intravenous contrast.  COMPARISON:  05/17/2014  FINDINGS: Generalized atrophy.  Normal ventricular morphology.  No midline shift or mass effect.  Small vessel chronic ischemic changes of deep cerebral white matter.  No intracranial hemorrhage, mass lesion, or acute infarction.  Visualized paranasal sinuses and mastoid air cells clear.  Bones unremarkable.  Atherosclerotic calcifications of internal carotid and vertebral arteries at skullbase.  IMPRESSION: Atrophy with small vessel chronic ischemic changes of deep cerebral white matter.  No acute intracranial abnormalities.   Electronically Signed   By: Lavonia Dana M.D.   On: 06/09/2015 19:53   Dg Hip Unilat With Pelvis 2-3 Views Right  06/09/2015   CLINICAL DATA:  64 year old female with a history of dizziness  EXAM: DG HIP (WITH  OR WITHOUT PELVIS) 2-3V RIGHT  COMPARISON:  None.  FINDINGS: Diffuse osteopenia.  Fracture of the inferior right pubic ramus.  Left hip arthroplasty.  Degenerative changes of the lumbar spine.  Vascular calcifications.  Unremarkable appearance of the proximal right femur.  IMPRESSION: Nondisplaced fracture of the right inferior pubic ramus.  Surgical  changes of left hip arthroplasty.  Osteopenia.  Atherosclerosis.  Signed,  Dulcy Fanny. Earleen Newport, DO  [facility]  [facility]   Electronically Signed   By: Corrie Mckusick D.O.   On: 06/09/2015 20:58    Scheduled Meds: . aspirin  325 mg Oral Daily  . carvedilol  3.125 mg Oral BID WC  . clonazePAM  2 mg Oral QHS  . clonazePAM  4 mg Oral Daily  . clopidogrel  75 mg Oral Daily  . heparin  5,000 Units Subcutaneous 3 times per day  . losartan  12.5 mg Oral Daily  . omega-3 acid ethyl esters  1 g Oral Daily  . QUEtiapine  400 mg Oral QHS  . risperiDONE  1 mg Oral TID  . sodium chloride  3 mL Intravenous Q12H  . sodium chloride  3 mL Intravenous Q12H   Continuous Infusions:   Active Problems:   Pubic ramus fracture   Fracture of multiple pubic rami   Syncope    Time spent: 20 min    [facility] Pager [phone]. If 7PM-7AM, please contact night-coverage at [email or web address], password [facility] 06/10/2015, 2:08 PM  LOS: 1 day

## 2015-06-11 ENCOUNTER — Observation Stay (HOSPITAL_COMMUNITY): Payer: Medicare Other

## 2015-06-11 DIAGNOSIS — I951 Orthostatic hypotension: Secondary | ICD-10-CM | POA: Diagnosis not present

## 2015-06-11 DIAGNOSIS — R42 Dizziness and giddiness: Secondary | ICD-10-CM | POA: Diagnosis not present

## 2015-06-11 DIAGNOSIS — S32501A Unspecified fracture of right pubis, initial encounter for closed fracture: Secondary | ICD-10-CM

## 2015-06-11 DIAGNOSIS — I959 Hypotension, unspecified: Secondary | ICD-10-CM | POA: Diagnosis not present

## 2015-06-11 DIAGNOSIS — E2740 Unspecified adrenocortical insufficiency: Secondary | ICD-10-CM | POA: Diagnosis not present

## 2015-06-11 DIAGNOSIS — R55 Syncope and collapse: Secondary | ICD-10-CM | POA: Diagnosis not present

## 2015-06-11 DIAGNOSIS — E871 Hypo-osmolality and hyponatremia: Secondary | ICD-10-CM | POA: Diagnosis not present

## 2015-06-11 DIAGNOSIS — W19XXXA Unspecified fall, initial encounter: Secondary | ICD-10-CM | POA: Diagnosis not present

## 2015-06-11 DIAGNOSIS — I255 Ischemic cardiomyopathy: Secondary | ICD-10-CM | POA: Diagnosis not present

## 2015-06-11 LAB — BASIC METABOLIC PANEL
ANION GAP: 9 (ref 5–15)
BUN: 16 mg/dL (ref 6–20)
CO2: 23 mmol/L (ref 22–32)
CREATININE: 1.42 mg/dL — AB (ref 0.44–1.00)
Calcium: 9.5 mg/dL (ref 8.9–10.3)
Chloride: 100 mmol/L — ABNORMAL LOW (ref 101–111)
GFR calc Af Amer: 45 mL/min — ABNORMAL LOW (ref >60)
GFR, EST NON AFRICAN AMERICAN: 39 mL/min — AB (ref >60)
GLUCOSE: 88 mg/dL (ref 65–99)
Potassium: 4 mmol/L (ref 3.5–5.1)
SODIUM: 132 mmol/L — AB (ref 135–145)

## 2015-06-11 MED ORDER — ACETAMINOPHEN 500 MG PO TABS
500.0000 mg | ORAL_TABLET | Freq: Three times a day (TID) | ORAL | Status: DC
  Administered 2015-06-11 – 2015-06-18 (×17): 500 mg via ORAL
  Filled 2015-06-11 (×18): qty 1

## 2015-06-11 NOTE — Progress Notes (Signed)
Echocardiogram 2D Echocardiogram has been performed.  Caroline [name] 06/11/2015, 12:58 PM

## 2015-06-11 NOTE — Progress Notes (Signed)
PT Cancellation Note  Patient Details Name: Caroline [name] MRN: [id number] DOB: [birth year]   Cancelled Treatment:    Reason Eval/Treat Not Completed: Patient at procedure or test/unavailable (having echo).  Will return to complete evaluation today as time and pt availability allows.  PT will continue to follow acutely.  Thank you for this order.  Joslyn Hy PT, DPT [phone] Pager: [phone] 06/11/2015, 11:58 AM

## 2015-06-11 NOTE — Evaluation (Signed)
Physical Therapy Evaluation Patient Details Name: Caroline [name] MRN: [id number] DOB: 1951/08/09 Today's Date: 06/11/2015   History of Present Illness  Pt is a 64 y/o F s/p fall after becoming dizzy w/ resultant R pubic ramus fx.  Pt's PMH includes CAD, MI, SOB, R breast cancer, anxiety, CHF, angina, HTN, depression, a fib.  Clinical Impression  Ms. Sparling ambulated 6 ft w/ PT this session w/ difficulty picking up feet 2/2 pain.  Pt would benefit from use of pediatric RW at next session.  Pt became emotional during session (see notes below) and chaplain contacted.  Pt reports weight loss over the past few months 2/2 dec appetite (RN notified).  Pt will benefit from continued skilled PT services to increase functional independence and safety.    Follow Up Recommendations Home health PT;Supervision/Assistance - 24 hour    Equipment Recommendations  Rolling walker with 5" wheels (Pediatric RW)    Recommendations for Other Services OT consult     Precautions / Restrictions Precautions Precautions: Fall Precaution Comments: pt presenting w/ ortho HTN Restrictions Weight Bearing Restrictions: No      Mobility  Bed Mobility               General bed mobility comments: pt sitting EOB upon PT arrival  Transfers Overall transfer level: Needs assistance Equipment used: Rolling walker (2 wheeled) Transfers: Sit to/from Stand Sit to Stand: Min guard         General transfer comment: Close min guard 2/2 pt's report of previous ortho HTN.  Cues provided for proper hand placement and use of RW.  Ambulation/Gait Ambulation/Gait assistance: Min guard Ambulation Distance (Feet): 6 Feet Assistive device: Rolling walker (2 wheeled) Gait Pattern/deviations: Step-to pattern;Antalgic;Shuffle;Decreased stride length;Decreased stance time - right   Gait velocity interpretation: Below normal speed for age/gender General Gait Details: Shuffle BLE's despite VCs to pick feet off ground and  push through BUEs to offload RLE when stepping w/ LLE.  However, after pt performed marching in standing she was able to amb ~2 ft w/o shuffling feet.  Stairs            Wheelchair Mobility    Modified Rankin (Stroke Patients Only)       Balance Overall balance assessment: Needs assistance Sitting-balance support: No upper extremity supported;Feet supported Sitting balance-Leahy Scale: Good     Standing balance support: Bilateral upper extremity supported;During functional activity Standing balance-Leahy Scale: Poor Standing balance comment: Relies on RW for support                             Pertinent Vitals/Pain Pain Assessment: 0-10 Pain Score: 7  Pain Location: R groin Pain Descriptors / Indicators: Aching Pain Intervention(s): Limited activity within patient's tolerance;Monitored during session;Repositioned    Home Living Family/patient expects to be discharged to:: Private residence Living Arrangements: Non-relatives/Friends Available Help at Discharge: Available 24 hours/day;Friend(s) Type of Home: House Home Access: Stairs to enter Entrance Stairs-Rails: None Entrance Stairs-Number of Steps: 4 Home Layout: Two level;Able to live on main level with bedroom/bathroom Home Equipment: Kasandra Knudsen - single point;Bedside commode;Wheelchair - [employer] - 2 wheels Additional Comments: Pt also  has 8 steps in the front of her home w/ railings on both sides (able to reach both railings at the same time).    Prior Function Level of Independence: Independent               Hand Dominance  Extremity/Trunk Assessment               Lower Extremity Assessment: RLE deficits/detail RLE Deficits / Details: limited ROM and strength s/p fall and R pubic ramus fx       Communication   Communication: No difficulties  Cognition Arousal/Alertness: Awake/alert Behavior During Therapy: Flat affect Overall Cognitive Status: No family/caregiver  present to determine baseline cognitive functioning (some slow processing)                      General Comments General comments (skin integrity, edema, etc.): At end of session pt becomes emotional and when asked what is bothering her she responds, "I have been losing weight, I used to weight 130 lbs". When asked if she has been eating as much she said no, that she has been having a dec in appetite.  Pt unable to think of why she has had dec in appetite.  RN notified of pt's report of dec weight and sticky note left for physician.  Pt additionally becomes upset when she thinks about her pain.  Pt reports she is religious and agrees to have a chaplain visit her room.  Chaplain was contacted and will visit pt.    Exercises General Exercises - Lower Extremity Ankle Circles/Pumps: AROM;Both;15 reps;Seated Gluteal Sets: Strengthening;Both;10 reps;Seated Heel Slides: AROM;Both;10 reps;Seated Hip ABduction/ADduction: AROM;Both;10 reps;Seated Straight Leg Raises: AROM;Both;5 reps;Seated;Limitations Straight Leg Raises Limitations: Ext lag (L>R) depite VCs to keep leg straight      Assessment/Plan    PT Assessment Patient needs continued PT services  PT Diagnosis Difficulty walking;Abnormality of gait;Generalized weakness;Acute pain   PT Problem List Decreased strength;Decreased range of motion;Decreased activity tolerance;Decreased balance;Decreased mobility;Decreased coordination;Decreased cognition;Decreased knowledge of use of DME;Decreased safety awareness;Decreased knowledge of precautions;Pain  PT Treatment Interventions DME instruction;Gait training;Stair training;Functional mobility training;Therapeutic activities;Therapeutic exercise;Balance training;Neuromuscular re-education;Cognitive remediation;Patient/family education;Modalities;Wheelchair mobility training   PT Goals (Current goals can be found in the Care Plan section) Acute Rehab PT Goals Patient Stated Goal: none  stated PT Goal Formulation: With patient Time For Goal Achievement: 06/18/15 Potential to Achieve Goals: Good    Frequency Min 5X/week   Barriers to discharge Inaccessible home environment 4-8 steps to enter home    Co-evaluation               End of Session Equipment Utilized During Treatment: Gait belt Activity Tolerance: Patient limited by pain;Patient limited by fatigue Patient left: in chair;with call bell/phone within reach Nurse Communication: Mobility status;Precautions;Weight bearing status (weight loss and pt's emotional state.  Chaplain contacted.)    Functional Assessment Tool Used: Clinical Judgement Functional Limitation: Mobility: Walking and moving around Mobility: Walking and Moving Around Current Status [phone]): At least 20 percent but less than 40 percent impaired, limited or restricted Mobility: Walking and Moving Around Goal Status [phone]): At least 1 percent but less than 20 percent impaired, limited or restricted    Time: 6144-3154 PT Time Calculation (min) (ACUTE ONLY): 35 min   Charges:   PT Evaluation $Initial PT Evaluation Tier I: 1 Procedure PT Treatments $Therapeutic Exercise: 8-22 mins   PT G Codes:   PT G-Codes **NOT FOR INPATIENT CLASS** Functional Assessment Tool Used: Clinical Judgement Functional Limitation: Mobility: Walking and moving around Mobility: Walking and Moving Around Current Status ([id number]): At least 20 percent but less than 40 percent impaired, limited or restricted Mobility: Walking and Moving Around Goal Status [phone]): At least 1 percent but less than 20 percent impaired, limited or restricted  Joslyn Hy PT, DPT [phone] Pager: [phone] 06/11/2015, 3:06 PM

## 2015-06-11 NOTE — Progress Notes (Signed)
[facility] PROGRESS NOTE  Caroline [name] FYB:[id number] DOB: 1951-04-28 DOA: 06/09/2015 PCP: [name], PA-C  Assessment/Plan: 1. Fall- patient denies passing out, but complains of dizziness. Patient was noted to be orthostatic. Patient also with borderline blood pressures. 2-D echo with no cardiac source of emboli. Carotid Dopplers with no significant ICA stenosis. Continue gentle hydration. Will discontinue ARB. Follow. 2. History of CAD and ischemic cardio myopathy- patient's EF 30-35% on last echo from 05/18/2015. Repeat echocardiogram with EF of 30-35%, diffuse hypokinesis with 1 diastolic dysfunction with no significant change from prior 2-D echo. Continue Plavix. Continue Coreg. DC'd losartan secondary to borderline blood pressure. 3. Pubic ramus fracture- pain control, orthopedics Dr. Lorin Mercy was consulted by the ED physician. Ortho has seen the patient and recommended conservative treatment  and to follow-up as outpatient. Will place patient on scheduled Tylenol.  4. DVT prophylaxis- heparin  Code Status: Full code Family Communication: Updated patient. No family at bedside Disposition Plan: Home when medically stable with home health hopefully tomorrow.   Consultants:  Orthopedics: Dr. Lorin Mercy 06/10/2015  Procedures:  2-D echo 06/11/2015  Antibiotics:    HPI/Subjective: 64 y.o. female, with past medical history significant for coronary artery disease status post MI and stent placement, cardiomyopathy and carotid occlusion, presenting today status post a fall with right hip pain that happened yesterday. Patient became dizzy and fell. Patient has history of dizziness and being followed by Dr. Flonnie Overman from cardiology. Patient denies any preceding chest pains , palpitations or shortness of breath. Patient denies any chest pain or shortness of breath. Has been walking with the help of walker   Patient denies any dizziness. No chest pain. No shortness of  breath.  Objective: Filed Vitals:   06/11/15 1500  BP: 106/50  Pulse:   Temp: 98.5 F (36.9 C)  Resp: 18    Intake/Output Summary (Last 24 hours) at 06/11/15 1655 Last data filed at 06/11/15 1300  Gross per 24 hour  Intake    480 ml  Output      0 ml  Net    480 ml   Filed Weights   06/09/15 1756 06/09/15 2242  Weight: 52.164 kg (115 lb) 49.442 kg (109 lb)    Exam:   General:  Appears in no acute distress  Cardiovascular: S1-S2 regular  Respiratory: Clear to auscultation bilaterally  Abdomen: Soft, nontender, no organomegaly  Musculoskeletal: No edema of the lower extremities noted   Data Reviewed: Basic Metabolic Panel:  Recent Labs Lab 06/09/15 1850 06/11/15 0612  NA 133* 132*  K 4.2 4.0  CL 100* 100*  CO2 21* 23  GLUCOSE 92 88  BUN 16 16  CREATININE 1.92* 1.42*  CALCIUM 9.0 9.5   CBC:  Recent Labs Lab 06/09/15 1850  WBC 8.2  NEUTROABS 6.1  HGB 11.5*  HCT 34.1*  MCV 86.1  PLT 232   Cardiac Enzymes:  Recent Labs Lab 06/10/15 0001 06/10/15 0534 06/10/15 1210  TROPONINI <0.03 <0.03 <0.03   Studies: Ct Head Wo Contrast  06/09/2015   CLINICAL DATA:  Became dizzy and fell yesterday striking head, on Plavix, history breast cancer 2002, CHF, hypertension, coronary artery disease post MI, atrial fibrillation, carotid occlusion  EXAM: CT HEAD WITHOUT CONTRAST  TECHNIQUE: Contiguous axial images were obtained from the base of the skull through the vertex without intravenous contrast.  COMPARISON:  05/17/2014  FINDINGS: Generalized atrophy.  Normal ventricular morphology.  No midline shift or mass effect.  Small vessel chronic ischemic changes of  deep cerebral white matter.  No intracranial hemorrhage, mass lesion, or acute infarction.  Visualized paranasal sinuses and mastoid air cells clear.  Bones unremarkable.  Atherosclerotic calcifications of internal carotid and vertebral arteries at skullbase.  IMPRESSION: Atrophy with small vessel chronic  ischemic changes of deep cerebral white matter.  No acute intracranial abnormalities.   Electronically Signed   By: Lavonia Dana M.D.   On: 06/09/2015 19:53   Dg Hip Unilat With Pelvis 2-3 Views Right  06/09/2015   CLINICAL DATA:  64 year old female with a history of dizziness  EXAM: DG HIP (WITH OR WITHOUT PELVIS) 2-3V RIGHT  COMPARISON:  None.  FINDINGS: Diffuse osteopenia.  Fracture of the inferior right pubic ramus.  Left hip arthroplasty.  Degenerative changes of the lumbar spine.  Vascular calcifications.  Unremarkable appearance of the proximal right femur.  IMPRESSION: Nondisplaced fracture of the right inferior pubic ramus.  Surgical changes of left hip arthroplasty.  Osteopenia.  Atherosclerosis.  Signed,  Dulcy Fanny. Earleen Newport, DO  [facility]  [facility]   Electronically Signed   By: Corrie Mckusick D.O.   On: 06/09/2015 20:58    Scheduled Meds: . aspirin  325 mg Oral Daily  . calcium carbonate  1 tablet Oral BID WC  . carvedilol  3.125 mg Oral BID WC  . clonazePAM  2 mg Oral QHS  . clonazePAM  4 mg Oral Daily  . clopidogrel  75 mg Oral Daily  . heparin  5,000 Units Subcutaneous 3 times per day  . omega-3 acid ethyl esters  1 g Oral Daily  . QUEtiapine  400 mg Oral QHS  . risperiDONE  1 mg Oral TID  . sodium chloride  3 mL Intravenous Q12H  . sodium chloride  3 mL Intravenous Q12H   Continuous Infusions:   Active Problems:   ANXIETY DEPRESSION   Carotid artery occlusion with infarction   Dizziness and giddiness   Cardiomyopathy, ischemic   Pubic ramus fracture   Fracture of multiple pubic rami   Orthostasis    Time spent: 35 min    [facility] MD [facility] Pager [phone]. If 7PM-7AM, please contact night-coverage at [email or web address], password [facility] 06/11/2015, 4:55 PM  LOS: 2 days

## 2015-06-11 NOTE — Progress Notes (Signed)
   06/11/15 1400  Clinical Encounter Type  Visited With Patient;Health care provider  Visit Type Initial;Spiritual support;Social support  Spiritual Encounters  Spiritual Needs Emotional   Chaplain was requested to patient's room. Patient has been teary today and patient's nurse thought a visit might help. Chaplain visited with patient for roughly 15 minutes. Patient said she is happy that she is getting better. However, patient is extremely sad that she is not at home with her pet parrot. Patient became teary when talking about this. Chaplain affirmed the patient's feelings of sadness. Chaplain let patient know he is available for future support if needed. Chaplain will continue to provide emotional and spiritual support as needed.  Nayomi Tabron R, Chaplain  3:00 PM

## 2015-06-12 ENCOUNTER — Encounter (HOSPITAL_COMMUNITY): Payer: Self-pay | Admitting: General Practice

## 2015-06-12 ENCOUNTER — Observation Stay (HOSPITAL_COMMUNITY): Payer: Medicare Other

## 2015-06-12 DIAGNOSIS — R42 Dizziness and giddiness: Secondary | ICD-10-CM | POA: Diagnosis not present

## 2015-06-12 DIAGNOSIS — E871 Hypo-osmolality and hyponatremia: Secondary | ICD-10-CM | POA: Diagnosis not present

## 2015-06-12 DIAGNOSIS — I959 Hypotension, unspecified: Secondary | ICD-10-CM | POA: Diagnosis not present

## 2015-06-12 DIAGNOSIS — I951 Orthostatic hypotension: Secondary | ICD-10-CM | POA: Diagnosis not present

## 2015-06-12 LAB — BASIC METABOLIC PANEL
Anion gap: 9 (ref 5–15)
BUN: 16 mg/dL (ref 6–20)
CALCIUM: 9.3 mg/dL (ref 8.9–10.3)
CHLORIDE: 95 mmol/L — AB (ref 101–111)
CO2: 22 mmol/L (ref 22–32)
CREATININE: 1.5 mg/dL — AB (ref 0.44–1.00)
GFR, EST AFRICAN AMERICAN: 42 mL/min — AB (ref >60)
GFR, EST NON AFRICAN AMERICAN: 36 mL/min — AB (ref >60)
Glucose, Bld: 99 mg/dL (ref 65–99)
POTASSIUM: 3.9 mmol/L (ref 3.5–5.1)
Sodium: 126 mmol/L — ABNORMAL LOW (ref 135–145)

## 2015-06-12 LAB — SODIUM, URINE, RANDOM: Sodium, Ur: 20 mmol/L

## 2015-06-12 LAB — CBC
HEMATOCRIT: 31.7 % — AB (ref 36.0–46.0)
Hemoglobin: 10.8 g/dL — ABNORMAL LOW (ref 12.0–15.0)
MCH: 28.4 pg (ref 26.0–34.0)
MCHC: 34.1 g/dL (ref 30.0–36.0)
MCV: 83.4 fL (ref 78.0–100.0)
PLATELETS: 223 10*3/uL (ref 150–400)
RBC: 3.8 MIL/uL — ABNORMAL LOW (ref 3.87–5.11)
RDW: 14.7 % (ref 11.5–15.5)
WBC: 3.9 10*3/uL — ABNORMAL LOW (ref 4.0–10.5)

## 2015-06-12 LAB — OSMOLALITY: Osmolality: 266 mOsm/kg — ABNORMAL LOW (ref 275–300)

## 2015-06-12 LAB — OSMOLALITY, URINE: Osmolality, Ur: 204 mOsm/kg — ABNORMAL LOW (ref 390–1090)

## 2015-06-12 LAB — CREATININE, URINE, RANDOM: CREATININE, URINE: 44.52 mg/dL

## 2015-06-12 LAB — TSH: TSH: 4.311 u[IU]/mL (ref 0.350–4.500)

## 2015-06-12 LAB — CORTISOL: Cortisol, Plasma: 7.9 ug/dL

## 2015-06-12 MED ORDER — ENSURE ENLIVE PO LIQD
237.0000 mL | Freq: Two times a day (BID) | ORAL | Status: DC
  Administered 2015-06-12 – 2015-06-18 (×12): 237 mL via ORAL

## 2015-06-12 MED ORDER — SODIUM CHLORIDE 0.9 % IV SOLN
INTRAVENOUS | Status: DC
  Administered 2015-06-12 – 2015-06-13 (×3): via INTRAVENOUS
  Filled 2015-06-12: qty 1000

## 2015-06-12 MED ORDER — DEXAMETHASONE SODIUM PHOSPHATE 4 MG/ML IJ SOLN
2.0000 mg | Freq: Four times a day (QID) | INTRAMUSCULAR | Status: DC
  Administered 2015-06-12 – 2015-06-15 (×11): 2 mg via INTRAVENOUS
  Filled 2015-06-12 (×11): qty 1

## 2015-06-12 MED ORDER — COSYNTROPIN 0.25 MG IJ SOLR
0.2500 mg | Freq: Once | INTRAMUSCULAR | Status: AC
  Administered 2015-06-13: 0.25 mg via INTRAVENOUS
  Filled 2015-06-12: qty 0.25

## 2015-06-12 NOTE — Progress Notes (Signed)
Subjective: Doing well.  C/o pain.  Moving slowly with PT.    Objective: Vital signs in last 24 hours: Temp:  [98.3 F (36.8 C)-98.5 F (36.9 C)] 98.3 F (36.8 C) (08/09 0505) Pulse Rate:  [79-82] 79 (08/09 0505) Resp:  [16-18] 16 (08/09 0505) BP: (102-116)/(50-68) 102/55 mmHg (08/09 0505) SpO2:  [100 %] 100 % (08/09 0505)  Intake/Output from previous day: 08/08 0701 - 08/09 0700 In: 363 [P.O.:360; I.V.:3] Out: -  Intake/Output this shift:     Recent Labs  06/09/15 1850  HGB 11.5*    Recent Labs  06/09/15 1850  WBC 8.2  RBC 3.96  HCT 34.1*  PLT 232    Recent Labs  06/09/15 1850 06/11/15 0612  NA 133* 132*  K 4.2 4.0  CL 100* 100*  CO2 21* 23  BUN 16 16  CREATININE 1.92* 1.42*  GLUCOSE 92 88  CALCIUM 9.0 9.5    Recent Labs  06/09/15 1850  INR 0.98    Neurovascular intact Sensation intact distally Compartment soft  Assessment/Plan: Stable from ortho standpoint.  D/c when cleared by medicine team.  F/u with dr Lorin Mercy in two weeks   Caroline [name] M 06/12/2015, 8:03 AM

## 2015-06-12 NOTE — Discharge Instructions (Signed)
Weight bear as tolerated with walker.  No driving, lifting.

## 2015-06-12 NOTE — Progress Notes (Signed)
[facility] PROGRESS NOTE  Caroline [name] ZDG:[id number] DOB: [birth year] DOA: 06/09/2015 PCP: [name], PA-C  Assessment/Plan: 1.   Hypotension Questionable etiology. Patient noted to be hypotensive over the past couple of days and had fallen secondary to dizziness and noted to be orthostatic. Patient's Avapro has been discontinued. Patient with no signs or symptoms of infection and unlikely septic. Check a UA with cultures and sensitivities. Check a chest x-ray. TSH was ordered which was within normal limits at 4.311. Random plasma cortisol at 3:39 PM was 7.9 which is low. 2-D echo with EF of 30-35% with diffuse hypokinesis grade 1 diastolic dysfunction unchanged from prior 2-D echo. Concern for possible adrenal insufficiency. Patient now also noted to be hyponatremic. We'll place patient on IV fluids. Fluid bolus. We'll place patient on IV dexamethasone. Will perform ACTH stimulation test tomorrow. Follow.  2.  Hyponatremia Maybe secondary to hypovolemic hyponatremia versus adrenal insufficiency. TSH within normal limits. Patient with no signs or symptoms of infection. Check a UA with cultures and sensitivities. Check a chest x-ray. Random cortisol was 7.9 at 3:39 PM. Check a urine osmolality. Check a serum osmolality. Check a fractional excretion of sodium. Place on IV dexamethasone. Perform a ACTH stimulation test tomorrow. Serial BMETS. 1. Fall- patient denies passing out, but complains of dizziness. Patient was noted to be orthostatic. Patient also with hypotension. 2-D echo with no cardiac source of emboli. Carotid Dopplers with no significant ICA stenosis. Continue gentle hydration. Discontinued ARB. Follow. 2. History of CAD and ischemic cardio myopathy- patient's EF 30-35% on last echo from 05/18/2015. Repeat echocardiogram with EF of 30-35%, diffuse hypokinesis with 1 diastolic dysfunction with no significant change from prior 2-D echo. Continue Plavix. Continue Coreg. DC'd  losartan secondary to borderline blood pressure. 3. Pubic ramus fracture- pain control, orthopedics Dr. Lorin Mercy was consulted by the ED physician. Ortho has seen the patient and recommended conservative treatment  and to follow-up as outpatient. Will place patient on scheduled Tylenol. 4. DVT prophylaxis- heparin  Code Status: Full code Family Communication: Updated patient. No family at bedside Disposition Plan: Home when no longer hypotensive and hyponatremia has resolved..   Consultants:  Orthopedics: Dr. Lorin Mercy 06/10/2015  Procedures:  2-D echo 06/11/2015  Antibiotics:    HPI/Subjective: 64 y.o. female, with past medical history significant for coronary artery disease status post MI and stent placement, cardiomyopathy and carotid occlusion, presenting today status post a fall with right hip pain that happened yesterday. Patient became dizzy and fell. Patient has history of dizziness and being followed by Dr. Flonnie Overman from cardiology. Patient denies any preceding chest pains , palpitations or shortness of breath. Patient denies any chest pain or shortness of breath. Has been walking with the help of walker   Patient denies any dizziness. No chest pain. No shortness of breath. Patient sitting in the dark. Patient tearful that she's not going home today.  Objective: Filed Vitals:   06/12/15 1826  BP: 82/54  Pulse:   Temp:   Resp:     Intake/Output Summary (Last 24 hours) at 06/12/15 1950 Last data filed at 06/12/15 0900  Gross per 24 hour  Intake    243 ml  Output      0 ml  Net    243 ml   Filed Weights   06/09/15 1756 06/09/15 2242  Weight: 52.164 kg (115 lb) 49.442 kg (109 lb)    Exam:   General:  Appears in no acute distress  Cardiovascular: S1-S2 regular  Respiratory:  Clear to auscultation bilaterally  Abdomen: Soft, nontender, no organomegaly  Musculoskeletal: No edema of the lower extremities noted   Data Reviewed: Basic Metabolic Panel:  Recent  Labs Lab 06/09/15 1850 06/11/15 0612 06/12/15 0728  NA 133* 132* 126*  K 4.2 4.0 3.9  CL 100* 100* 95*  CO2 21* 23 22  GLUCOSE 92 88 99  BUN 16 16 16   CREATININE 1.92* 1.42* 1.50*  CALCIUM 9.0 9.5 9.3   CBC:  Recent Labs Lab 06/09/15 1850 06/12/15 0728  WBC 8.2 3.9*  NEUTROABS 6.1  --   HGB 11.5* 10.8*  HCT 34.1* 31.7*  MCV 86.1 83.4  PLT 232 223   Cardiac Enzymes:  Recent Labs Lab 06/10/15 0001 06/10/15 0534 06/10/15 1210  TROPONINI <0.03 <0.03 <0.03   Studies: No results found.  Scheduled Meds: . acetaminophen  500 mg Oral TID  . aspirin  325 mg Oral Daily  . calcium carbonate  1 tablet Oral BID WC  . carvedilol  3.125 mg Oral BID WC  . clonazePAM  2 mg Oral QHS  . clonazePAM  4 mg Oral Daily  . clopidogrel  75 mg Oral Daily  . feeding supplement (ENSURE ENLIVE)  237 mL Oral BID BM  . heparin  5,000 Units Subcutaneous 3 times per day  . omega-3 acid ethyl esters  1 g Oral Daily  . QUEtiapine  400 mg Oral QHS  . risperiDONE  1 mg Oral TID  . sodium chloride  3 mL Intravenous Q12H  . sodium chloride  3 mL Intravenous Q12H   Continuous Infusions: . sodium chloride 0.9 % 1,000 mL infusion 75 mL/hr at 06/12/15 1505    Principal Problem:   Hypotension Active Problems:   ANXIETY DEPRESSION   Carotid artery occlusion with infarction   Dizziness and giddiness   Cardiomyopathy, ischemic   Pubic ramus fracture   Fracture of multiple pubic rami   Orthostasis   Dizziness   Hyponatremia    Time spent: 35 min    [facility] MD [facility] Pager [phone]. If 7PM-7AM, please contact night-coverage at [email or web address], password [facility] 06/12/2015, 7:50 PM  LOS: 3 days

## 2015-06-12 NOTE — Care Management Note (Signed)
Case Management Note  Patient Details  Name: Caroline [name] MRN: [id number] Date of Birth: Dec 22, 1950  Subjective/Objective:  64 yr old female admitted with a right non displaced pubic fracture.                  Action/Plan:  Case manager spoke with patient concerning home health and DME needs. Choice was offered for home health. Referral was called to [place]. Patient states she has rolling walker and 3in1 and that she has support at discharge.   Expected Discharge Date:   06/12/15               Expected Discharge Plan:  [place]  In-House Referral:  NA  Discharge planning Services  CM Consult  Post Acute Care Choice:  Home Health Choice offered to:  Patient  DME Arranged:   NA DME Agency:     HH Arranged:  PT [place] Agency:  [place]  Status of Service:  Completed, signed off  Medicare Important Message Given:    Date Medicare IM Given:    Medicare IM give by:    Date Additional Medicare IM Given:    Additional Medicare Important Message give by:     If discussed at [place] of Stay Meetings, dates discussed:    Additional Comments:  Ninfa Meeker, RN 06/12/2015, 10:31 AM

## 2015-06-12 NOTE — Progress Notes (Signed)
Physical Therapy Treatment Patient Details Name: Caroline [name] MRN: [id number] DOB: 16-May-1951 Today's Date: 06/12/2015    History of Present Illness Pt is a 64 y/o F s/p fall after becoming dizzy w/ resultant R pubic ramus fx.  Pt's PMH includes CAD, MI, SOB, R breast cancer, anxiety, CHF, angina, HTN, depression, a fib.    PT Comments    Patient with flat affect. Agreeable to walk to restroom. Patient somewhat self limiting. She will have assistance from friend at home. Continue to recommend HHPT  Follow Up Recommendations  Home health PT;Supervision/Assistance - 24 hour     Equipment Recommendations  Rolling walker with 5" wheels    Recommendations for Other Services       Precautions / Restrictions Precautions Precautions: Fall    Mobility  Bed Mobility Overal bed mobility: Modified Independent                Transfers Overall transfer level: Needs assistance Equipment used: Rolling walker (2 wheeled)   Sit to Stand: Min guard         General transfer comment: Close min guard 2/2 pt's report of previous ortho HTN.  Cues provided for proper hand placement and use of RW.  Ambulation/Gait Ambulation/Gait assistance: Min guard Ambulation Distance (Feet): 30 Feet Assistive device: Rolling walker (2 wheeled) Gait Pattern/deviations: Step-through pattern;Decreased stride length   Gait velocity interpretation: Below normal speed for age/gender General Gait Details: Shuffle BLE's despite VCs to pick feet off ground and push through BUEs to offload RLE when stepping w/ LLE.  Cues to pick up LLE   Stairs            Wheelchair Mobility    Modified Rankin (Stroke Patients Only)       Balance                                    Cognition Arousal/Alertness: Awake/alert Behavior During Therapy: Flat affect Overall Cognitive Status: No family/caregiver present to determine baseline cognitive functioning                       Exercises      General Comments        Pertinent Vitals/Pain Pain Score: 7  Pain Location: R groin Pain Descriptors / Indicators: Aching Pain Intervention(s): Monitored during session    Home Living Family/patient expects to be discharged to:: Private residence Living Arrangements: Alone                  Prior Function            PT Goals (current goals can now be found in the care plan section) Progress towards PT goals: Progressing toward goals    Frequency  Min 5X/week    PT Plan Current plan remains appropriate    Co-evaluation             End of Session Equipment Utilized During Treatment: Gait belt Activity Tolerance: Patient limited by pain Patient left: in chair;with call bell/phone within reach     Time: 1330-1351 PT Time Calculation (min) (ACUTE ONLY): 21 min  Charges:  $Gait Training: 8-22 mins                    G Codes:      Jacqualyn Posey 06/12/2015, 2:06 PM 06/12/2015 Jacqualyn Posey PTA [phone] pager [phone] office

## 2015-06-13 ENCOUNTER — Telehealth: Payer: Self-pay

## 2015-06-13 DIAGNOSIS — Y92000 Kitchen of unspecified non-institutional (private) residence as  the place of occurrence of the external cause: Secondary | ICD-10-CM | POA: Diagnosis not present

## 2015-06-13 DIAGNOSIS — Z7982 Long term (current) use of aspirin: Secondary | ICD-10-CM | POA: Diagnosis not present

## 2015-06-13 DIAGNOSIS — Z96642 Presence of left artificial hip joint: Secondary | ICD-10-CM | POA: Diagnosis present

## 2015-06-13 DIAGNOSIS — I6529 Occlusion and stenosis of unspecified carotid artery: Secondary | ICD-10-CM | POA: Diagnosis present

## 2015-06-13 DIAGNOSIS — E785 Hyperlipidemia, unspecified: Secondary | ICD-10-CM | POA: Diagnosis present

## 2015-06-13 DIAGNOSIS — I255 Ischemic cardiomyopathy: Secondary | ICD-10-CM | POA: Diagnosis present

## 2015-06-13 DIAGNOSIS — I959 Hypotension, unspecified: Secondary | ICD-10-CM | POA: Diagnosis not present

## 2015-06-13 DIAGNOSIS — F419 Anxiety disorder, unspecified: Secondary | ICD-10-CM | POA: Diagnosis present

## 2015-06-13 DIAGNOSIS — S32501A Unspecified fracture of right pubis, initial encounter for closed fracture: Secondary | ICD-10-CM | POA: Diagnosis not present

## 2015-06-13 DIAGNOSIS — E2740 Unspecified adrenocortical insufficiency: Secondary | ICD-10-CM | POA: Diagnosis not present

## 2015-06-13 DIAGNOSIS — N183 Chronic kidney disease, stage 3 (moderate): Secondary | ICD-10-CM | POA: Diagnosis present

## 2015-06-13 DIAGNOSIS — W19XXXA Unspecified fall, initial encounter: Secondary | ICD-10-CM | POA: Diagnosis not present

## 2015-06-13 DIAGNOSIS — I129 Hypertensive chronic kidney disease with stage 1 through stage 4 chronic kidney disease, or unspecified chronic kidney disease: Secondary | ICD-10-CM | POA: Diagnosis present

## 2015-06-13 DIAGNOSIS — Z955 Presence of coronary angioplasty implant and graft: Secondary | ICD-10-CM | POA: Diagnosis not present

## 2015-06-13 DIAGNOSIS — S32591A Other specified fracture of right pubis, initial encounter for closed fracture: Secondary | ICD-10-CM | POA: Diagnosis present

## 2015-06-13 DIAGNOSIS — R42 Dizziness and giddiness: Secondary | ICD-10-CM | POA: Diagnosis present

## 2015-06-13 DIAGNOSIS — I4891 Unspecified atrial fibrillation: Secondary | ICD-10-CM | POA: Diagnosis present

## 2015-06-13 DIAGNOSIS — I951 Orthostatic hypotension: Secondary | ICD-10-CM | POA: Diagnosis not present

## 2015-06-13 DIAGNOSIS — Z7902 Long term (current) use of antithrombotics/antiplatelets: Secondary | ICD-10-CM | POA: Diagnosis not present

## 2015-06-13 DIAGNOSIS — I252 Old myocardial infarction: Secondary | ICD-10-CM | POA: Diagnosis not present

## 2015-06-13 DIAGNOSIS — F329 Major depressive disorder, single episode, unspecified: Secondary | ICD-10-CM | POA: Diagnosis present

## 2015-06-13 DIAGNOSIS — E871 Hypo-osmolality and hyponatremia: Secondary | ICD-10-CM | POA: Diagnosis not present

## 2015-06-13 DIAGNOSIS — I2510 Atherosclerotic heart disease of native coronary artery without angina pectoris: Secondary | ICD-10-CM | POA: Diagnosis present

## 2015-06-13 DIAGNOSIS — F17210 Nicotine dependence, cigarettes, uncomplicated: Secondary | ICD-10-CM | POA: Diagnosis present

## 2015-06-13 DIAGNOSIS — Z79899 Other long term (current) drug therapy: Secondary | ICD-10-CM | POA: Diagnosis not present

## 2015-06-13 DIAGNOSIS — Z853 Personal history of malignant neoplasm of breast: Secondary | ICD-10-CM | POA: Diagnosis not present

## 2015-06-13 LAB — BASIC METABOLIC PANEL
Anion gap: 7 (ref 5–15)
Anion gap: 11 (ref 5–15)
BUN: 18 mg/dL (ref 6–20)
BUN: 20 mg/dL (ref 6–20)
CALCIUM: 9.3 mg/dL (ref 8.9–10.3)
CO2: 24 mmol/L (ref 22–32)
CO2: 19 mmol/L — ABNORMAL LOW (ref 22–32)
CREATININE: 1.25 mg/dL — AB (ref 0.44–1.00)
Calcium: 9.2 mg/dL (ref 8.9–10.3)
Chloride: 95 mmol/L — ABNORMAL LOW (ref 101–111)
Chloride: 97 mmol/L — ABNORMAL LOW (ref 101–111)
Creatinine, Ser: 1.32 mg/dL — ABNORMAL HIGH (ref 0.44–1.00)
GFR calc Af Amer: 49 mL/min — ABNORMAL LOW (ref >60)
GFR calc non Af Amer: 42 mL/min — ABNORMAL LOW (ref >60)
GFR, EST AFRICAN AMERICAN: 52 mL/min — AB (ref >60)
GFR, EST NON AFRICAN AMERICAN: 45 mL/min — AB (ref >60)
GLUCOSE: 130 mg/dL — AB (ref 65–99)
GLUCOSE: 147 mg/dL — AB (ref 65–99)
POTASSIUM: 4.5 mmol/L (ref 3.5–5.1)
Potassium: 4.8 mmol/L (ref 3.5–5.1)
Sodium: 126 mmol/L — ABNORMAL LOW (ref 135–145)
Sodium: 127 mmol/L — ABNORMAL LOW (ref 135–145)

## 2015-06-13 LAB — URINALYSIS, ROUTINE W REFLEX MICROSCOPIC
Bilirubin Urine: NEGATIVE
Glucose, UA: NEGATIVE mg/dL
Hgb urine dipstick: NEGATIVE
KETONES UR: NEGATIVE mg/dL
LEUKOCYTES UA: NEGATIVE
Nitrite: NEGATIVE
PH: 6.5 (ref 5.0–8.0)
Protein, ur: NEGATIVE mg/dL
Specific Gravity, Urine: 1.005 (ref 1.005–1.030)
Urobilinogen, UA: 0.2 mg/dL (ref 0.0–1.0)

## 2015-06-13 LAB — CBC
HCT: 29.8 % — ABNORMAL LOW (ref 36.0–46.0)
Hemoglobin: 10.4 g/dL — ABNORMAL LOW (ref 12.0–15.0)
MCH: 29.3 pg (ref 26.0–34.0)
MCHC: 34.9 g/dL (ref 30.0–36.0)
MCV: 83.9 fL (ref 78.0–100.0)
Platelets: 233 10*3/uL (ref 150–400)
RBC: 3.55 MIL/uL — ABNORMAL LOW (ref 3.87–5.11)
RDW: 14.7 % (ref 11.5–15.5)
WBC: 4.9 10*3/uL (ref 4.0–10.5)

## 2015-06-13 LAB — MAGNESIUM: MAGNESIUM: 1.6 mg/dL — AB (ref 1.7–2.4)

## 2015-06-13 MED ORDER — MAGNESIUM SULFATE 50 % IJ SOLN
3.0000 g | Freq: Once | INTRAVENOUS | Status: AC
  Administered 2015-06-13: 3 g via INTRAVENOUS
  Filled 2015-06-13: qty 6

## 2015-06-13 MED ORDER — COSYNTROPIN 0.25 MG IJ SOLR
0.2500 mg | Freq: Once | INTRAMUSCULAR | Status: AC
  Administered 2015-06-14: 0.25 mg via INTRAVENOUS
  Filled 2015-06-13: qty 0.25

## 2015-06-13 NOTE — Progress Notes (Addendum)
Initial Nutrition Assessment  DOCUMENTATION CODES:   Not applicable  INTERVENTION:   Provide Ensure Enlive po BID, each supplement provides 350 kcal and 20 grams of protein.  Encourage adequate PO intake.   NUTRITION DIAGNOSIS:   Increased nutrient needs related to chronic illness as evidenced by estimated needs.  GOAL:   Patient will meet greater than or equal to 90% of their needs  MONITOR:   PO intake, Supplement acceptance, Weight trends, Labs, I & O's  REASON FOR ASSESSMENT:   Malnutrition Screening Tool    ASSESSMENT:   64 y.o. female, with past medical history significant for coronary artery disease status post MI and stent placement, cardiomyopathy and carotid occlusion, presenting today status post a fall with right hip pain. Pt with R pubic ramus fx. Plans for conservative treatment.   Meal completion has been varied from 25-100%. Pt reports having a good appetite currently and PTA. Pt reports she usually consumes 1 meal a day and drinks Ensure 3 times daily. Pt reports weight loss with usual body weight ~125 lbs. Pt unsure for reason for weight loss. Per Epic weight records, pt with a 12% weight loss in 1 month. Pt currently has Ensure ordered BID. RD to continue with current orders. Pt was encouraged to eat her food at meals and to drink her supplements.   Limited Nutrition-Focused physical exam completed as pt did not want RD to observe/touch her legs. Findings are no fat depletion, no muscle depletion, and no edema.   Labs and medications reviewed.   Diet Order:  Diet regular Room service appropriate?: Yes; Fluid consistency:: Thin  Skin:  Reviewed, no issues  Last BM:  8/9  Height:   Ht Readings from Last 1 Encounters:  06/09/15 5\' 3"  (1.6 m)    Weight:   Wt Readings from Last 1 Encounters:  06/09/15 109 lb (49.442 kg)    Ideal Body Weight:  52.27 kg  BMI:  Body mass index is 19.31 kg/(m^2).  Estimated Nutritional Needs:   Kcal:   1600-1800  Protein:  65-80 grams  Fluid:  1.6 - 1.8 L/day  EDUCATION NEEDS:   No education needs identified at this time  Corrin Parker, MS, RD, LDN Pager # [phone] After hours/ weekend pager # [phone]

## 2015-06-13 NOTE — Progress Notes (Signed)
[facility] PROGRESS NOTE  Caroline Caroline [name] BZJ:[id number] DOB: 10-30-1951 DOA: 06/09/2015 PCP: Caroline Rio, PA-C  Assessment/Plan: 1.   Hypotension Questionable etiology. Patient noted to be hypotensive over the past couple of days and had fallen secondary to dizziness and noted to be orthostatic. Patient's Avapro has been discontinued. Patient with no signs or symptoms of infection and unlikely septic. Check a UA with cultures and sensitivities. Check a chest x-ray. TSH was ordered which was within normal limits at 4.311. Random plasma cortisol at 3:39 PM was 7.9. 2-D echo with EF of 30-35% with diffuse hypokinesis grade 1 diastolic dysfunction unchanged from prior 2-D echo. Concern for possible adrenal insufficiency. Patient now also noted to be hyponatremic. Continue IVF. Continue IV dexamethasone. Will perform ACTH stimulation test tomorrow. Follow.  2.  Hyponatremia Maybe secondary to hypovolemic hyponatremia versus adrenal insufficiency. TSH within normal limits. Patient with no signs or symptoms of infection. Check a UA with cultures and sensitivities. Chest x-ray negative. Random cortisol was 7.9 at 3:39 PM. Urine osmolality is low. Serum osmolality is low. FENA= 0.53%. Increase IVF. Continue IV dexamethasone. Perform a ACTH stimulation test tomorrow. Serial BMETS. 1. Fall- patient denies passing out, but complains of dizziness. Patient was noted to be orthostatic. Patient also with hypotension. 2-D echo with no cardiac source of emboli. Carotid Dopplers with no significant ICA stenosis. Continue gentle hydration. Discontinued ARB. Follow. 2. History of CAD and ischemic cardio myopathy- patient's EF 30-35% on last echo from 05/18/2015. Repeat echocardiogram with EF of 30-35%, diffuse hypokinesis with 1 diastolic dysfunction with no significant change from prior 2-D echo. Continue Plavix. Continue Coreg. DC'd losartan secondary to borderline blood pressure. 3. Pubic ramus fracture-  pain control, orthopedics Dr. Lorin [name] was consulted by the ED physician. Ortho has seen the patient and recommended conservative treatment  and to follow-up as outpatient. Continue scheduled tylenol. Pain management prn. 4. DVT prophylaxis- heparin  Code Status: Full code Family Communication: Updated patient. No family at bedside Disposition Plan: Home when no longer hypotensive and hyponatremia has resolved..   Consultants:  Orthopedics: Dr. Lorin [name] 06/10/2015  Procedures:  2-D echo 06/11/2015  Antibiotics:    HPI/Subjective: 64 y.o. Caroline [name], with past medical history significant for coronary artery disease status post MI and stent placement, cardiomyopathy and carotid occlusion, presenting today status post a fall with right hip pain that happened yesterday. Patient became dizzy and fell. Patient has history of dizziness and being followed by Dr. Flonnie [name] from cardiology. Patient denies any preceding chest pains , palpitations or shortness of breath. Patient denies any chest pain or shortness of breath. Has been walking with the help of walker   Patient denies any dizziness. No CP, no SOB.  Objective: Filed Vitals:   06/13/15 0433  BP: 107/62  Pulse: 74  Temp: 98 F (36.7 C)  Resp: 16    Intake/Output Summary (Last 24 hours) at 06/13/15 1140 Last data filed at 06/13/15 0900  Gross per 24 hour  Intake 1427.5 ml  Output    600 ml  Net  827.5 ml   Filed Weights   06/09/15 1756 06/09/15 2242  Weight: 52.164 kg (115 lb) 49.442 kg (109 lb)    Exam:   General:  Appears in no acute distress  Cardiovascular: S1-S2 regular  Respiratory: Clear to auscultation bilaterally  Abdomen: Soft, nontender, no organomegaly  Musculoskeletal: No edema of the lower extremities noted   Data Reviewed: Basic Metabolic Panel:  Recent Labs Lab 06/09/15 1850 06/11/15 0612 06/12/15 0175  06/13/15 0520  NA 133* 132* 126* 126*  K 4.2 4.0 3.9 4.5  CL 100* 100* 95* 95*  CO2 21* 23  22 24   GLUCOSE 92 88 99 130*  BUN 16 16 16 18   CREATININE 1.92* 1.42* 1.50* 1.32*  CALCIUM 9.0 9.5 9.3 9.2  MG  --   --   --  1.6*   CBC:  Recent Labs Lab 06/09/15 1850 06/12/15 0728 06/13/15 0520  WBC 8.2 3.9* 4.9  NEUTROABS 6.1  --   --   HGB 11.5* 10.8* 10.4*  HCT 34.1* 31.7* 29.8*  MCV 86.1 83.4 83.9  PLT 232 223 233   Cardiac Enzymes:  Recent Labs Lab 06/10/15 0001 06/10/15 0534 06/10/15 1210  TROPONINI <0.03 <0.03 <0.03   Studies: Dg Chest Port 1 View  06/12/2015   CLINICAL DATA:  Hypotension with recent fall  EXAM: PORTABLE CHEST - 1 VIEW  COMPARISON:  April 17, 2012  FINDINGS: There is no edema or consolidation. Heart size and pulmonary vascularity are normal. No adenopathy. A coronary stent is noted on the left. There are surgical clips in the right breast region.  IMPRESSION: No edema or consolidation.   Electronically Signed   By: Lowella Grip III M.D.   On: 06/12/2015 21:46    Scheduled Meds: . acetaminophen  500 mg Oral TID  . aspirin  325 mg Oral Daily  . calcium carbonate  1 tablet Oral BID WC  . carvedilol  3.125 mg Oral BID WC  . clonazePAM  2 mg Oral QHS  . clonazePAM  4 mg Oral Daily  . clopidogrel  75 mg Oral Daily  . dexamethasone  2 mg Intravenous 4 times per day  . feeding supplement (ENSURE ENLIVE)  237 mL Oral BID BM  . heparin  5,000 Units Subcutaneous 3 times per day  . magnesium sulfate 1 - 4 g bolus IVPB  3 g Intravenous Once  . omega-3 acid ethyl esters  1 g Oral Daily  . QUEtiapine  400 mg Oral QHS  . risperiDONE  1 mg Oral TID  . sodium chloride  3 mL Intravenous Q12H  . sodium chloride  3 mL Intravenous Q12H   Continuous Infusions: . sodium chloride 0.9 % 1,000 mL infusion 75 mL/hr at 06/13/15 2671    Principal Problem:   Hypotension Active Problems:   ANXIETY DEPRESSION   Carotid artery occlusion with infarction   Dizziness and giddiness   Cardiomyopathy, ischemic   Pubic ramus fracture   Fracture of multiple pubic  rami   Orthostasis   Dizziness   Hyponatremia    Time spent: 35 min    [facility] MD [facility] Pager [phone]. If 7PM-7AM, please contact night-coverage at [email or web address], password [facility] 06/13/2015, 11:40 AM  LOS: 4 days

## 2015-06-13 NOTE — Progress Notes (Signed)
Physical Therapy Treatment Patient Details Name: Caroline [name] MRN: [id number] DOB: 11-05-1950 Today's Date: 06/13/2015    History of Present Illness Pt is a 64 y/o F s/p fall after becoming dizzy w/ resultant R pubic ramus fx.  Pt's PMH includes CAD, MI, SOB, R breast cancer, anxiety, CHF, angina, HTN, depression, a fib.    PT Comments    Patient continues with flat affect and very little conversation. Patient stated "dont touch me" when assisting with ambulation. Continues to be self limiting with activity. Will have 24/7 care at home. Continue with current POC  Follow Up Recommendations  Home health PT;Supervision/Assistance - 24 hour     Equipment Recommendations  Rolling walker with 5" wheels (youth size RW)    Recommendations for Other Services       Precautions / Restrictions Precautions Precautions: Fall Restrictions Weight Bearing Restrictions: No    Mobility  Bed Mobility Overal bed mobility: Modified Independent                Transfers Overall transfer level: Needs assistance Equipment used: Rolling walker (2 wheeled)   Sit to Stand: Supervision         General transfer comment: Supervision for safety. Cues for hand placement  Ambulation/Gait Ambulation/Gait assistance: Min guard Ambulation Distance (Feet): 40 Feet Assistive device: Rolling walker (2 wheeled) Gait Pattern/deviations: Step-through pattern;Decreased stride length;Decreased step length - left;Decreased step length - right;Shuffle Gait velocity: decreased Gait velocity interpretation: Below normal speed for age/gender General Gait Details: Patient continues to shuffle LEs with ambulation. Cues for upright posture. Slight LOB once during gait but she was able to self correct.    Stairs            Wheelchair Mobility    Modified Rankin (Stroke Patients Only)       Balance                                    Cognition Arousal/Alertness:  Awake/alert Behavior During Therapy: Flat affect Overall Cognitive Status: No family/caregiver present to determine baseline cognitive functioning                      Exercises      General Comments        Pertinent Vitals/Pain Pain Score: 7  Pain Location: "same as yesterday" R groin Pain Descriptors / Indicators: Aching;Sore Pain Intervention(s): Monitored during session    Home Living                      Prior Function            PT Goals (current goals can now be found in the care plan section) Progress towards PT goals: Progressing toward goals    Frequency  Min 5X/week    PT Plan Current plan remains appropriate    Co-evaluation             End of Session Equipment Utilized During Treatment: Gait belt Activity Tolerance: Patient tolerated treatment well;Patient limited by pain Patient left: in chair;with call bell/phone within reach     Time: 1002-1026 PT Time Calculation (min) (ACUTE ONLY): 24 min  Charges:  $Gait Training: 8-22 mins $Therapeutic Activity: 8-22 mins                    G Codes:      Jacqualyn Posey 06/13/2015, 10:44  AM  06/13/2015 Jacqualyn Posey PTA [phone] pager [phone] office

## 2015-06-13 NOTE — Telephone Encounter (Signed)
Recently admitted to hospital.  Called to reschedule Medicare Wellness Visit.  AWV scheduled for 06/20/15 at 1:45 pm.  Provider will be out of office.  Left a message for call back.

## 2015-06-13 NOTE — Progress Notes (Signed)
Subjective:   follow-up pubic rami fx Patient reports pain as 7 on 0-10 scale.    Objective: Vital signs in last 24 hours: Temp:  [98 F (36.7 C)-99.2 F (37.3 C)] 98 F (36.7 C) (08/10 0433) Pulse Rate:  [74-90] 74 (08/10 0433) Resp:  [16-18] 16 (08/10 0433) BP: (82-120)/(53-65) 107/62 mmHg (08/10 0433) SpO2:  [100 %] 100 % (08/10 0433)  Intake/Output from previous day: 08/09 0701 - 08/10 0700 In: 240 [P.O.:240] Out: 600 [Urine:600] Intake/Output this shift:     Recent Labs  06/12/15 0728 06/13/15 0520  HGB 10.8* 10.4*    Recent Labs  06/12/15 0728 06/13/15 0520  WBC 3.9* 4.9  RBC 3.80* 3.55*  HCT 31.7* 29.8*  PLT 223 233    Recent Labs  06/12/15 0728 06/13/15 0520  NA 126* 126*  K 3.9 4.5  CL 95* 95*  CO2 22 24  BUN 16 18  CREATININE 1.50* 1.32*  GLUCOSE 99 130*  CALCIUM 9.3 9.2   No results for input(s): LABPT, INR in the last 72 hours.  Neurologically intact  Assessment/Plan:     Up with therapy  Moving to chair. Still with pain. Slow progress.   YATES,MARK C 06/13/2015, 7:37 AM

## 2015-06-14 DIAGNOSIS — E2740 Unspecified adrenocortical insufficiency: Secondary | ICD-10-CM

## 2015-06-14 LAB — BASIC METABOLIC PANEL
ANION GAP: 9 (ref 5–15)
ANION GAP: 9 (ref 5–15)
BUN: 20 mg/dL (ref 6–20)
BUN: 24 mg/dL — ABNORMAL HIGH (ref 6–20)
CALCIUM: 9 mg/dL (ref 8.9–10.3)
CHLORIDE: 95 mmol/L — AB (ref 101–111)
CHLORIDE: 96 mmol/L — AB (ref 101–111)
CO2: 22 mmol/L (ref 22–32)
CO2: 22 mmol/L (ref 22–32)
Calcium: 9.2 mg/dL (ref 8.9–10.3)
Creatinine, Ser: 1.28 mg/dL — ABNORMAL HIGH (ref 0.44–1.00)
Creatinine, Ser: 1.42 mg/dL — ABNORMAL HIGH (ref 0.44–1.00)
GFR calc non Af Amer: 39 mL/min — ABNORMAL LOW (ref >60)
GFR, EST AFRICAN AMERICAN: 51 mL/min — AB (ref >60)
GFR, EST AFRICAN AMERICAN: 45 mL/min — AB (ref >60)
GFR, EST NON AFRICAN AMERICAN: 44 mL/min — AB (ref >60)
Glucose, Bld: 99 mg/dL (ref 65–99)
Glucose, Bld: 134 mg/dL — ABNORMAL HIGH (ref 65–99)
POTASSIUM: 4.9 mmol/L (ref 3.5–5.1)
Potassium: 5.1 mmol/L (ref 3.5–5.1)
SODIUM: 127 mmol/L — AB (ref 135–145)
Sodium: 126 mmol/L — ABNORMAL LOW (ref 135–145)

## 2015-06-14 LAB — ACTH STIMULATION, 3 TIME POINTS
Cortisol, 30 Min: 17.7 ug/dL
Cortisol, 60 Min: 28.2 ug/dL
Cortisol, Base: 1.4 ug/dL

## 2015-06-14 LAB — CBC
HEMATOCRIT: 26.5 % — AB (ref 36.0–46.0)
Hemoglobin: 9.1 g/dL — ABNORMAL LOW (ref 12.0–15.0)
MCH: 29.1 pg (ref 26.0–34.0)
MCHC: 34.3 g/dL (ref 30.0–36.0)
MCV: 84.7 fL (ref 78.0–100.0)
Platelets: 252 10*3/uL (ref 150–400)
RBC: 3.13 MIL/uL — AB (ref 3.87–5.11)
RDW: 15.2 % (ref 11.5–15.5)
WBC: 6.7 10*3/uL (ref 4.0–10.5)

## 2015-06-14 LAB — URINE CULTURE

## 2015-06-14 LAB — MAGNESIUM: Magnesium: 1.9 mg/dL (ref 1.7–2.4)

## 2015-06-14 MED ORDER — SODIUM CHLORIDE 0.9 % IV SOLN
INTRAVENOUS | Status: DC
  Administered 2015-06-14 (×2): via INTRAVENOUS

## 2015-06-14 NOTE — Progress Notes (Signed)
Called lab and spoke to [place] about coming at 0500 to 5N03 to do a blood draw for ACTH collection.

## 2015-06-14 NOTE — Progress Notes (Signed)
Slow progress.  She can follow-up with me in 2 to 3 wks.  Follow-up put in discharge section.    [phone]

## 2015-06-14 NOTE — Progress Notes (Signed)
Physical Therapy Treatment Patient Details Name: NANIE DUNKLEBERGER MRN: [id number] DOB: 28-Aug-1951 Today's Date: 06/14/2015    History of Present Illness Pt is a 64 y/o F s/p fall after becoming dizzy w/ resultant R pubic ramus fx.  Pt's PMH includes CAD, MI, SOB, R breast cancer, anxiety, CHF, angina, HTN, depression, a fib.    PT Comments    Patient initially stating that she would walk to [facility] to recliner however once up from [facility] patient asked to walk to the door and thanked this PTA upon leaving for walking with her. Patient continues to be very flat affect and appears unmotivated. Patient does want to do it all on her own and does not want anyone to touch her. Patient required A to prevent fall with ambulation and later stated that it "scared" her. Patient continues to ensure that she has someone with her at all times when she goes home. Continue with current recommendation and POC  Follow Up Recommendations  Home health PT;Supervision/Assistance - 24 hour     Equipment Recommendations  Rolling walker with 5" wheels (Youth size)    Recommendations for Other Services       Precautions / Restrictions Precautions Precautions: Fall    Mobility  Bed Mobility Overal bed mobility: Modified Independent                Transfers Overall transfer level: Needs assistance Equipment used: Rolling walker (2 wheeled)   Sit to Stand: Supervision         General transfer comment: Supervision for safety. Cues for hand placement  Ambulation/Gait Ambulation/Gait assistance: Min guard;Min assist Ambulation Distance (Feet): 40 Feet Assistive device: Rolling walker (2 wheeled) Gait Pattern/deviations: Step-through pattern;Decreased stride length;Shuffle     General Gait Details: Patient continues to shuffle LEs with ambulation. Cues for upright posture. Patient with one posteior LOB requiring Min A to prevent fall. Patient later said that it "scared me". Patient educated on positioning  and safe technique with turning and backing.    Stairs            Wheelchair Mobility    Modified Rankin (Stroke Patients Only)       Balance                                    Cognition Arousal/Alertness: Awake/alert Behavior During Therapy: Flat affect Overall Cognitive Status: No family/caregiver present to determine baseline cognitive functioning                      Exercises      General Comments        Pertinent Vitals/Pain Pain Assessment: Faces Faces Pain Scale: Hurts little more Pain Location: Did not give numeric value    Home Living                      Prior Function            PT Goals (current goals can now be found in the care plan section) Progress towards PT goals: Progressing toward goals    Frequency  Min 5X/week    PT Plan Current plan remains appropriate    Co-evaluation             End of Session Equipment Utilized During Treatment: Gait belt Activity Tolerance: Patient tolerated treatment well Patient left: in chair;with call bell/phone within reach  Time: 5927-6394 PT Time Calculation (min) (ACUTE ONLY): 18 min  Charges:  $Gait Training: 8-22 mins                    G Codes:      Jacqualyn Posey 06/14/2015, 10:39 AM  06/14/2015 Jacqualyn Posey PTA [phone] pager [phone] office

## 2015-06-14 NOTE — Progress Notes (Signed)
[facility] PROGRESS NOTE  Caroline [name]:[id number] DOB: 22-Nov-1950 DOA: 06/09/2015 PCP: [name], PA-C  Assessment/Plan: 1.   Hypotension Questionable etiology. Patient noted to be hypotensive over the past couple of days and had fallen secondary to dizziness and noted to be orthostatic. Patient's Avapro has been discontinued. Patient with no signs or symptoms of infection and unlikely septic. UA is negative. Chest x-ray is negative. TSH was ordered which was within normal limits at 4.311. Random plasma cortisol at 3:39 PM was 7.9. Repeat cortisol level with ACTH stim test with a baseline cortisol level of 1.4. Blood pressure improved on IV dexamethasone. NSL  IV fluids. 2-D echo with EF of 30-35% with diffuse hypokinesis grade 1 diastolic dysfunction unchanged from prior 2-D echo. Concern for possible adrenal insufficiency. Patient now also noted to be hyponatremic. Continue IV dexamethasone.  check a cortisol level in the morning as well as a ACTH level. After labs are obtained in the morning will place on idle cortisol 15 mg in the morning then 10 mg daily at bedtime.  2.  Hyponatremia Maybe secondary to hypovolemic hyponatremia versus adrenal insufficiency. TSH within normal limits. Patient with no signs or symptoms of infection. Urinalysis is negative.  Chest x-ray negative. Random cortisol was 7.9 at 3:39 PM. Urine osmolality is low. Serum osmolality is low. FENA= 0.53%. a.m. cortisol level was 1.4. Will repeat a cortisol level in the morning with a ACTH test. Normal saline lock IV fluids. Continue IV dexamethasone. check a be met in the morning.  3. Probable adrenal insufficiency ACTH stim test with base cortisol level of 1.4. Cortisol level was checked 30 minutes and 90 minutes after cosyntropin was given. Will check a cortisol level tomorrow morning and ACTH level. Continue IV dexamethasone. Once labs are drawn in the morning will transition to hydrocortisone 15 mg in the  morning then 10 mg at bedtime.   4. Chronic kidney disease stage III Stable. Follow.   1. Fall- patient denies passing out, but complains of dizziness. Patient was noted to be orthostatic. Patient also with hypotension. 2-D echo with no cardiac source of emboli. Carotid Dopplers with no significant ICA stenosis. NSL IVF. Discontinued ARB. Follow. 2. History of CAD and ischemic cardio myopathy- patient's EF 30-35% on last echo from 05/18/2015. Repeat echocardiogram with EF of 30-35%, diffuse hypokinesis with 1 diastolic dysfunction with no significant change from prior 2-D echo. Continue Plavix. Continue Coreg. DC'd losartan secondary to borderline blood pressure. 3. Pubic ramus fracture- pain control, orthopedics Dr. Lorin Mercy was consulted by the ED physician. Ortho has seen the patient and recommended conservative treatment  and to follow-up as outpatient. Continue scheduled tylenol. Pain management prn. 4. DVT prophylaxis- heparin  Code Status: Full code Family Communication: Updated patient. No family at bedside Disposition Plan: Home vs SNF when no longer hypotensive and hyponatremia has resolved..   Consultants:  Orthopedics: Dr. Lorin Mercy 06/10/2015  Procedures:  2-D echo 06/11/2015  Antibiotics:    HPI/Subjective: 65 y.o. female, with past medical history significant for coronary artery disease status post MI and stent placement, cardiomyopathy and carotid occlusion, presenting today status post a fall with right hip pain that happened yesterday. Patient became dizzy and fell. Patient has history of dizziness and being followed by Dr. Flonnie Overman from cardiology. Patient denies any preceding chest pains , palpitations or shortness of breath. Patient denies any chest pain or shortness of breath. Has been walking with the help of walker   Patient denies any dizziness. No SOB.  No CP. Patient tearful that she's not going home.  Objective: Filed Vitals:   06/14/15 0600  BP: 110/60   Pulse:   Temp: 98.2 F (36.8 C)  Resp:     Intake/Output Summary (Last 24 hours) at 06/14/15 1159 Last data filed at 06/14/15 0900  Gross per 24 hour  Intake  566.5 ml  Output    600 ml  Net  -33.5 ml   Filed Weights   06/09/15 1756 06/09/15 2242  Weight: 52.164 kg (115 lb) 49.442 kg (109 lb)    Exam:   General:  Appears in no acute distress  Cardiovascular: S1-S2 regular  Respiratory: Clear to auscultation bilaterally  Abdomen: Soft, nontender, no organomegaly  Musculoskeletal: No edema of the lower extremities noted   Data Reviewed: Basic Metabolic Panel:  Recent Labs Lab 06/11/15 0612 06/12/15 0728 06/13/15 0520 06/13/15 1702 06/14/15 0500  NA 132* 126* 126* 127* 126*  K 4.0 3.9 4.5 4.8 4.9  CL 100* 95* 95* 97* 95*  CO2 $Re'23 22 24 'IbC$ 19* 22  GLUCOSE 88 99 130* 147* 99  BUN $Re'16 16 18 20 20  'uqo$ CREATININE 1.42* 1.50* 1.32* 1.25* 1.28*  CALCIUM 9.5 9.3 9.2 9.3 9.2  MG  --   --  1.6*  --   --    CBC:  Recent Labs Lab 06/09/15 1850 06/12/15 0728 06/13/15 0520 06/14/15 0500  WBC 8.2 3.9* 4.9 6.7  NEUTROABS 6.1  --   --   --   HGB 11.5* 10.8* 10.4* 9.1*  HCT 34.1* 31.7* 29.8* 26.5*  MCV 86.1 83.4 83.9 84.7  PLT 232 223 233 252   Cardiac Enzymes:  Recent Labs Lab 06/10/15 0001 06/10/15 0534 06/10/15 1210  TROPONINI <0.03 <0.03 <0.03   Studies: Dg Chest Port 1 View  06/12/2015   CLINICAL DATA:  Hypotension with recent fall  EXAM: PORTABLE CHEST - 1 VIEW  COMPARISON:  April 17, 2012  FINDINGS: There is no edema or consolidation. Heart size and pulmonary vascularity are normal. No adenopathy. A coronary stent is noted on the left. There are surgical clips in the right breast region.  IMPRESSION: No edema or consolidation.   Electronically Signed   By: Lowella Grip III M.D.   On: 06/12/2015 21:46    Scheduled Meds: . acetaminophen  500 mg Oral TID  . aspirin  325 mg Oral Daily  . calcium carbonate  1 tablet Oral BID WC  . carvedilol  3.125 mg  Oral BID WC  . clonazePAM  2 mg Oral QHS  . clonazePAM  4 mg Oral Daily  . clopidogrel  75 mg Oral Daily  . dexamethasone  2 mg Intravenous 4 times per day  . feeding supplement (ENSURE ENLIVE)  237 mL Oral BID BM  . heparin  5,000 Units Subcutaneous 3 times per day  . omega-3 acid ethyl esters  1 g Oral Daily  . QUEtiapine  400 mg Oral QHS  . risperiDONE  1 mg Oral TID   Continuous Infusions: . sodium chloride 125 mL/hr at 06/14/15 1145    Principal Problem:   Hypotension Active Problems:   ANXIETY DEPRESSION   Carotid artery occlusion with infarction   Dizziness and giddiness   Cardiomyopathy, ischemic   Pubic ramus fracture   Fracture of multiple pubic rami   Syncope   Orthostasis   Dizziness   Hyponatremia   Fall    Time spent: 35 min    [facility] MD [facility] Pager [phone]. If 7PM-7AM, please  contact night-coverage at [email or web address], password [facility] 06/14/2015, 11:59 AM  LOS: 5 days

## 2015-06-14 NOTE — Care Management Important Message (Signed)
Important Message  Patient Details  Name: Caroline [name] MRN: [id number] Date of Birth: [birth year]   Medicare Important Message Given:  Yes-second notification given    Delorse Lek 06/14/2015, 2:55 PM

## 2015-06-14 NOTE — Clinical Documentation Improvement (Signed)
Noted on 06/09/15 H&P..."4. Chronic renal failure, her creatinine has improved actually from 5/18"... 06/13/15 progr note.Marland KitchenMarland Kitchen"Increase IVF.".Marland KitchenMarland Kitchen [place]: Daily BMET; receiving NS @ 100cc/hr IV  Ref. Range 06/11/2015 06:12 06/12/2015 07:28 06/13/2015 05:20 06/13/2015 17:02 06/14/2015 05:00  Creatinine Latest Ref Range: 0.44-1.00 mg/dL 1.42 (H) 1.50 (H) 1.32 (H) 1.25 (H) 1.28 (H)  EGFR (Non-African Amer.) Latest Ref Range: >60 mL/min 39 (L) 36 (L) 42 (L) 45 (L) 44 (L)   For accurate DX specificity & severity can noted "CKD" be further specified with stage. Thanks you . Document the stage of CKD --Chronic kidney disease, stage 1- GFR > OR = 90 --Chronic kidney disease, stage 2 (mild) - GFR 60-89 --Chronic kidney disease, stage 3 (moderate) - GFR 30-59 --Chronic kidney disease, stage 4 (severe) - GFR 15-29 --Chronic kidney disease, stage 5- GFR < 15 --End-stage renal disease (ESRD) . Document any underlying cause of CKD such as Diabetes or Hypertension . Document if the patient is dependent on Dialysis . Chronic renal failure without a documented stage will be assigned to Chronic kidney disease, unspecified . Document any associated diagnoses/conditions  Supporting Information:  Thank You,  Ermelinda Das, RN, BSN, CCDS Certified Clinical Documentation Specialist Pager: [place]: Health Information Management

## 2015-06-15 ENCOUNTER — Telehealth: Payer: Self-pay | Admitting: Physician Assistant

## 2015-06-15 LAB — CBC
HCT: 26 % — ABNORMAL LOW (ref 36.0–46.0)
HEMOGLOBIN: 8.8 g/dL — AB (ref 12.0–15.0)
MCH: 28.9 pg (ref 26.0–34.0)
MCHC: 33.8 g/dL (ref 30.0–36.0)
MCV: 85.5 fL (ref 78.0–100.0)
Platelets: 237 10*3/uL (ref 150–400)
RBC: 3.04 MIL/uL — AB (ref 3.87–5.11)
RDW: 15.2 % (ref 11.5–15.5)
WBC: 6.2 10*3/uL (ref 4.0–10.5)

## 2015-06-15 LAB — BASIC METABOLIC PANEL
Anion gap: 8 (ref 5–15)
BUN: 19 mg/dL (ref 6–20)
CALCIUM: 8.7 mg/dL — AB (ref 8.9–10.3)
CHLORIDE: 101 mmol/L (ref 101–111)
CO2: 21 mmol/L — ABNORMAL LOW (ref 22–32)
CREATININE: 1.17 mg/dL — AB (ref 0.44–1.00)
GFR calc Af Amer: 57 mL/min — ABNORMAL LOW (ref >60)
GFR calc non Af Amer: 49 mL/min — ABNORMAL LOW (ref >60)
Glucose, Bld: 99 mg/dL (ref 65–99)
Potassium: 4.9 mmol/L (ref 3.5–5.1)
Sodium: 130 mmol/L — ABNORMAL LOW (ref 135–145)

## 2015-06-15 LAB — ACTH STIMULATION, 3 TIME POINTS
CORTISOL 30 MIN: 1.9 ug/dL
CORTISOL 60 MIN: 2 ug/dL
CORTISOL BASE: 1.8 ug/dL

## 2015-06-15 LAB — CORTISOL: CORTISOL PLASMA: 1.8 ug/dL

## 2015-06-15 MED ORDER — HYDROCORTISONE 10 MG PO TABS
10.0000 mg | ORAL_TABLET | Freq: Every day | ORAL | Status: DC
  Administered 2015-06-15 – 2015-06-16 (×2): 10 mg via ORAL
  Filled 2015-06-15 (×5): qty 1

## 2015-06-15 MED ORDER — COSYNTROPIN 0.25 MG IJ SOLR
0.2500 mg | Freq: Once | INTRAMUSCULAR | Status: AC
  Administered 2015-06-15: 0.25 mg via INTRAVENOUS
  Filled 2015-06-15: qty 0.25

## 2015-06-15 MED ORDER — HYDROCORTISONE 5 MG PO TABS
5.0000 mg | ORAL_TABLET | Freq: Every day | ORAL | Status: DC
  Administered 2015-06-15 – 2015-06-16 (×2): 5 mg via ORAL
  Filled 2015-06-15 (×3): qty 1

## 2015-06-15 MED ORDER — COSYNTROPIN 0.25 MG IJ SOLR
0.2500 mg | Freq: Once | INTRAMUSCULAR | Status: DC
  Filled 2015-06-15: qty 0.25

## 2015-06-15 NOTE — Progress Notes (Signed)
[facility] PROGRESS NOTE  Caroline [name] GGE:[id number] DOB: 10-04-1951 DOA: 06/09/2015 PCP: [name], PA-C  Assessment/Plan: 1.   Hypotension Questionable etiology. Patient noted to be hypotensive over the past couple of days and had fallen secondary to dizziness and noted to be orthostatic. Patient's Avapro has been discontinued. Patient with no signs or symptoms of infection and unlikely septic. UA is negative. Chest x-ray is negative. TSH was ordered which was within normal limits at 4.311. Random plasma cortisol at 3:39 PM was 7.9. Repeat cortisol level with ACTH stim test with a baseline cortisol level of 1.4. Blood pressure improved on IV dexamethasone. DC IV dexamethasone in place on oral hydrocortisone. NSL  IV fluids. 2-D echo with EF of 30-35% with diffuse hypokinesis grade 1 diastolic dysfunction unchanged from prior 2-D echo. Concern for possible adrenal insufficiency. Patient now also noted to be hyponatremic. Repeat cortisol level this morning was 1.8. ACTH level was not drawn as ordered. Will change IV dexamethasone to hydrocortisone 10 mg every morning and 5 mg daily at bedtime. Outpatient follow-up.  2.  Hyponatremia Maybe secondary to hypovolemic hyponatremia versus adrenal insufficiency. TSH within normal limits. Patient with no signs or symptoms of infection. Urinalysis is negative.  Chest x-ray negative. Random cortisol was 7.9 at 3:39 PM. Urine osmolality is low. Serum osmolality is low. FENA= 0.53%. a.m. cortisol level was 1.4. Repeat cortisol level this morning was 1.8. ACTH test was not drawn as ordered this morning. DC IV dexamethasone. Place on hydrocortisone 10 mg every morning and 5 mg daily at bedtime.  3. Probable adrenal insufficiency ACTH stim test with base cortisol level of 1.4. Cortisol level was checked 30 minutes and 90 minutes after cosyntropin was given. Cortisol level this morning was 1.8. ACTH level was not drawn as ordered. DC IV dexamethasone.  Place on hydrocortisone 10 mg every morning and 5 mg daily at bedtime. Patient will likely need outpatient follow-up with endocrinology.   4. Chronic kidney disease stage III Stable. Follow.   1. Fall- patient denies passing out, but complains of dizziness. Patient was noted to be orthostatic. Patient also with hypotension. 2-D echo with no cardiac source of emboli. Carotid Dopplers with no significant ICA stenosis. NSL IVF. Discontinued ARB. Follow. 2. History of CAD and ischemic cardio myopathy- patient's EF 30-35% on last echo from 05/18/2015. Repeat echocardiogram with EF of 30-35%, diffuse hypokinesis with 1 diastolic dysfunction with no significant change from prior 2-D echo. Continue Plavix. Continue Coreg. DC'd losartan secondary to borderline blood pressure. 3. Pubic ramus fracture- pain control, orthopedics Dr. Lorin Mercy was consulted by the ED physician. Ortho has seen the patient and recommended conservative treatment  and to follow-up as outpatient. Continue scheduled tylenol. Pain management prn. 4. DVT prophylaxis- heparin  Code Status: Full code Family Communication: Updated patient. No family at bedside Disposition Plan: SNF hopefully tomorrow.   Consultants:  Orthopedics: Dr. Lorin Mercy 06/10/2015  Procedures:  2-D echo 06/11/2015  Antibiotics:    HPI/Subjective: 64 y.o. female, with past medical history significant for coronary artery disease status post MI and stent placement, cardiomyopathy and carotid occlusion, presenting today status post a fall with right hip pain that happened yesterday. Patient became dizzy and fell. Patient has history of dizziness and being followed by Dr. Flonnie Overman from cardiology. Patient denies any preceding chest pains , palpitations or shortness of breath. Patient denies any chest pain or shortness of breath. Has been walking with the help of walker   Patient denies any dizziness. No SOB.  No CP.   Objective: Filed Vitals:   06/15/15 1300   BP: 135/67  Pulse: 95  Temp: 98.1 F (36.7 C)  Resp: 16    Intake/Output Summary (Last 24 hours) at 06/15/15 1740 Last data filed at 06/15/15 1300  Gross per 24 hour  Intake    360 ml  Output      0 ml  Net    360 ml   Filed Weights   06/09/15 1756 06/09/15 2242  Weight: 52.164 kg (115 lb) 49.442 kg (109 lb)    Exam:   General:  Appears in no acute distress  Cardiovascular: S1-S2 regular  Respiratory: Clear to auscultation bilaterally  Abdomen: Soft, nontender, no organomegaly  Musculoskeletal: No edema of the lower extremities noted   Data Reviewed: Basic Metabolic Panel:  Recent Labs Lab 06/13/15 0520 06/13/15 1702 06/14/15 0500 06/14/15 1614 06/15/15 0527  NA 126* 127* 126* 127* 130*  K 4.5 4.8 4.9 5.1 4.9  CL 95* 97* 95* 96* 101  CO2 24 19* 22 22 21*  GLUCOSE 130* 147* 99 134* 99  BUN 18 20 20  24* 19  CREATININE 1.32* 1.25* 1.28* 1.42* 1.17*  CALCIUM 9.2 9.3 9.2 9.0 8.7*  MG 1.6*  --   --  1.9  --    CBC:  Recent Labs Lab 06/09/15 1850 06/12/15 0728 06/13/15 0520 06/14/15 0500 06/15/15 0527  WBC 8.2 3.9* 4.9 6.7 6.2  NEUTROABS 6.1  --   --   --   --   HGB 11.5* 10.8* 10.4* 9.1* 8.8*  HCT 34.1* 31.7* 29.8* 26.5* 26.0*  MCV 86.1 83.4 83.9 84.7 85.5  PLT 232 223 233 252 237   Cardiac Enzymes:  Recent Labs Lab 06/10/15 0001 06/10/15 0534 06/10/15 1210  TROPONINI <0.03 <0.03 <0.03   Studies: No results found.  Scheduled Meds: . acetaminophen  500 mg Oral TID  . aspirin  325 mg Oral Daily  . calcium carbonate  1 tablet Oral BID WC  . carvedilol  3.125 mg Oral BID WC  . clonazePAM  2 mg Oral QHS  . clonazePAM  4 mg Oral Daily  . clopidogrel  75 mg Oral Daily  . feeding supplement (ENSURE ENLIVE)  237 mL Oral BID BM  . heparin  5,000 Units Subcutaneous 3 times per day  . hydrocortisone  10 mg Oral Daily  . hydrocortisone  5 mg Oral QHS  . omega-3 acid ethyl esters  1 g Oral Daily  . QUEtiapine  400 mg Oral QHS  . risperiDONE   1 mg Oral TID   Continuous Infusions:    Principal Problem:   Hypotension Active Problems:   ANXIETY DEPRESSION   Carotid artery occlusion with infarction   Dizziness and giddiness   Cardiomyopathy, ischemic   Pubic ramus fracture   Fracture of multiple pubic rami   Syncope   Orthostasis   Dizziness   Hyponatremia   Fall   Adrenal insufficiency    Time spent: 35 min    [facility] MD [facility] Pager [phone]. If 7PM-7AM, please contact night-coverage at [email or web address], password [facility] 06/15/2015, 5:40 PM  LOS: 6 days

## 2015-06-15 NOTE — Telephone Encounter (Signed)
Noted  

## 2015-06-15 NOTE — Telephone Encounter (Signed)
pt dtr Beth called to cancel, pt in hospital, fell again from dizzy/lightheaded, frx right pelvic bone, at [facility] hospital, pt will probably go to rehab facility (requesting [facility])  Cancelled appts for wellness visit with Ashlee 06/20/15 & follow up with [facility] 06/26/15

## 2015-06-15 NOTE — Progress Notes (Signed)
Physical Therapy Treatment Patient Details Name: Caroline [name] MRN: [id number] DOB: Jun 29, 1951 Today's Date: 06/15/2015    History of Present Illness Pt is a 64 y/o F s/p fall after becoming dizzy w/ resultant R pubic ramus fx.  Pt's PMH includes CAD, MI, SOB, R breast cancer, anxiety, CHF, angina, HTN, depression, a fib.    PT Comments    Patient initially refusing therapy this morning due to pain but with some encouragement patient agreeable to attempt some walking. Patient only able to make it to the chair with Min A to ensure balance and prevent fall. Patient more guarded and limited with ambulation due to pain. Patient was told yesterday that the help at home would now not be able to help her or be with her at home. Patient lives alone and is not safe to return home due to increased fall risk. Patient would not be able to care for herself. At this time recommend ongoing therapy at SNF to progress with mobility prior to returning home alone.   Follow Up Recommendations  Supervision for mobility/OOB;SNF     Equipment Recommendations  Rolling walker with 5" wheels (youth size)    Recommendations for Other Services       Precautions / Restrictions Precautions Precautions: Fall    Mobility  Bed Mobility Overal bed mobility: Modified Independent                Transfers Overall transfer level: Needs assistance Equipment used: Rolling walker (2 wheeled)   Sit to Stand: Min assist         General transfer comment: Min A for balance thi ssession as patient with increased pain from previous session  Ambulation/Gait Ambulation/Gait assistance: Min assist Ambulation Distance (Feet): 12 Feet Assistive device: Rolling walker (2 wheeled) Gait Pattern/deviations: Step-to pattern;Shuffle;Trunk flexed Gait velocity: decreased   General Gait Details: Patient continues to shuffle LEs with gait, more trunk flexion today in order to cope with pain. Cues for posture and safe use  of RW. MIn A for balance as patient tends to lean posteriorly   Stairs            Wheelchair Mobility    Modified Rankin (Stroke Patients Only)       Balance                                    Cognition Arousal/Alertness: Awake/alert Behavior During Therapy: Flat affect Overall Cognitive Status: No family/caregiver present to determine baseline cognitive functioning                      Exercises      General Comments        Pertinent Vitals/Pain Pain Score: 10-Worst pain ever Pain Location: R leg.  Pain Descriptors / Indicators: Sore;Crying;Discomfort Pain Intervention(s): Monitored during session;Repositioned    Home Living                      Prior Function            PT Goals (current goals can now be found in the care plan section) Progress towards PT goals: Not progressing toward goals - comment (due to increased pain)    Frequency  Min 3X/week    PT Plan Discharge plan needs to be updated    Co-evaluation             End of  Session Equipment Utilized During Treatment: Gait belt Activity Tolerance: Patient limited by pain Patient left: in chair;with call bell/phone within reach     Time: 1028-1044 PT Time Calculation (min) (ACUTE ONLY): 16 min  Charges:  $Gait Training: 8-22 mins                    G Codes:      Jacqualyn Posey 06/15/2015, 10:49 AM 06/15/2015 Jacqualyn Posey PTA [phone] pager [phone] office

## 2015-06-16 LAB — BASIC METABOLIC PANEL
Anion gap: 5 (ref 5–15)
BUN: 15 mg/dL (ref 6–20)
CO2: 24 mmol/L (ref 22–32)
Calcium: 8.8 mg/dL — ABNORMAL LOW (ref 8.9–10.3)
Chloride: 100 mmol/L — ABNORMAL LOW (ref 101–111)
Creatinine, Ser: 1.17 mg/dL — ABNORMAL HIGH (ref 0.44–1.00)
GFR calc Af Amer: 57 mL/min — ABNORMAL LOW (ref >60)
GFR, EST NON AFRICAN AMERICAN: 49 mL/min — AB (ref >60)
GLUCOSE: 93 mg/dL (ref 65–99)
Potassium: 4.5 mmol/L (ref 3.5–5.1)
SODIUM: 129 mmol/L — AB (ref 135–145)

## 2015-06-16 LAB — MAGNESIUM: Magnesium: 1.7 mg/dL (ref 1.7–2.4)

## 2015-06-16 MED ORDER — QUETIAPINE FUMARATE 400 MG PO TABS
400.0000 mg | ORAL_TABLET | Freq: Every day | ORAL | Status: DC
  Administered 2015-06-16 – 2015-06-17 (×2): 400 mg via ORAL
  Filled 2015-06-16 (×3): qty 1

## 2015-06-16 MED ORDER — QUETIAPINE FUMARATE ER 300 MG PO TB24
300.0000 mg | ORAL_TABLET | Freq: Every day | ORAL | Status: DC
  Administered 2015-06-16 – 2015-06-17 (×2): 300 mg via ORAL
  Filled 2015-06-16 (×3): qty 1

## 2015-06-16 MED ORDER — MAGNESIUM SULFATE 50 % IJ SOLN
3.0000 g | Freq: Once | INTRAMUSCULAR | Status: AC
  Administered 2015-06-16: 3 g via INTRAVENOUS
  Filled 2015-06-16: qty 6

## 2015-06-16 MED ORDER — QUETIAPINE FUMARATE ER 300 MG PO TB24
300.0000 mg | ORAL_TABLET | Freq: Every day | ORAL | Status: DC
  Filled 2015-06-16: qty 1

## 2015-06-16 NOTE — Progress Notes (Signed)
Assisting patient out of bed and noticed a large spot of bright red blood on the pad of the bed. Her gown also had a large amount of blood on it. She stated it was coming from where they "stuck her in the stomach with a needle this morning." Applied pressure to the site for a few minutes and then applied an Allevyn foam dressing to the site. Notified Dr. Grandville Silos and received orders to discontinue heparin and start SCDs. Florian Buff, RN

## 2015-06-16 NOTE — Progress Notes (Signed)
[facility] PROGRESS NOTE  Caroline [name] SWN:[id number] DOB: Nov 17, 1950 DOA: 06/09/2015 PCP: [name], PA-C  Assessment/Plan: 1.   Hypotension Questionable etiology. Patient noted to be hypotensive over the past couple of days and had fallen secondary to dizziness and noted to be orthostatic. Patient's Avapro has been discontinued. Patient with no signs or symptoms of infection and unlikely septic. UA is negative. Chest x-ray is negative. TSH was ordered which was within normal limits at 4.311. Random plasma cortisol at 3:39 PM was 7.9. Repeat cortisol level with ACTH stim test with a baseline cortisol level of 1.4. Blood pressure improved on IV dexamethasone. NSL  IV fluids. 2-D echo with EF of 30-35% with diffuse hypokinesis grade 1 diastolic dysfunction unchanged from prior 2-D echo. Concern for possible adrenal insufficiency. Patient now also noted to be hyponatremic. Repeat cortisol level this morning was 1.8. ACTH level was not drawn as ordered. Continue hydrocortisone 10 mg every morning and 5 mg daily at bedtime. Outpatient follow-up.  2.  Probable chronic Hyponatremia Maybe secondary to hypovolemic hyponatremia versus adrenal insufficiency. Improved. TSH within normal limits. Patient with no signs or symptoms of infection. Urinalysis is negative.  Chest x-ray negative. Random cortisol was 7.9 at 3:39 PM. Urine osmolality is low. Serum osmolality is low. FENA= 0.53%. a.m. cortisol level was 1.4. Repeat cortisol level this morning was 1.8. ACTH test was not drawn as ordered this morning. Continue hydrocortisone 10 mg every morning and 5 mg daily at bedtime.  3. Probable adrenal insufficiency ACTH stim test with base cortisol level of 1.4. Cortisol level was checked 30 minutes and 90 minutes after cosyntropin was given. Cortisol level on 06/15/2015 morning was 1.8. ACTH level was not drawn as ordered. DC IV dexamethasone. Continue hydrocortisone 10 mg every morning and 5 mg daily  at bedtime. Patient will likely need outpatient follow-up with endocrinology.   4. Chronic kidney disease stage III Stable. Follow.   1. Fall- patient denies passing out, but complains of dizziness. Patient was noted to be orthostatic. Patient also with hypotension. 2-D echo with no cardiac source of emboli. Carotid Dopplers with no significant ICA stenosis. NSL IVF. Discontinued ARB. Follow. 2. History of CAD and ischemic cardio myopathy- patient's EF 30-35% on last echo from 05/18/2015. Repeat echocardiogram with EF of 30-35%, diffuse hypokinesis with 1 diastolic dysfunction with no significant change from prior 2-D echo. Continue Plavix. Continue Coreg. DC'd losartan secondary to borderline blood pressure. 3. Pubic ramus fracture- pain control, orthopedics Dr. Lorin Mercy was consulted by the ED physician. Ortho has seen the patient and recommended conservative treatment  and to follow-up as outpatient. Continue scheduled tylenol. Pain management prn. 4. DVT prophylaxis- SCDs  Code Status: Full code Family Communication: Updated patient. No family at bedside Disposition Plan: SNF when bed available.    Consultants:  Orthopedics: Dr. Lorin Mercy 06/10/2015  Procedures:  2-D echo 06/11/2015  Antibiotics:  None  HPI/Subjective: 64 y.o. female, with past medical history significant for coronary artery disease status post MI and stent placement, cardiomyopathy and carotid occlusion, presenting today status post a fall with right hip pain that happened yesterday. Patient became dizzy and fell. Patient has history of dizziness and being followed by Dr. Flonnie Overman from cardiology. Patient denies any preceding chest pains , palpitations or shortness of breath. Patient denies any chest pain or shortness of breath. Has been walking with the help of walker   Patient with no CP. No SOB. Per nursing site for DVT prophylaxis bleeding.  Objective: Filed Vitals:  06/16/15 0554  BP: 127/66  Pulse: 79   Temp: 97.6 F (36.4 C)  Resp: 18    Intake/Output Summary (Last 24 hours) at 06/16/15 1441 Last data filed at 06/16/15 0900  Gross per 24 hour  Intake    360 ml  Output      0 ml  Net    360 ml   Filed Weights   06/09/15 1756 06/09/15 2242  Weight: 52.164 kg (115 lb) 49.442 kg (109 lb)    Exam:   General:  Appears in no acute distress  Cardiovascular: S1-S2 regular  Respiratory: Clear to auscultation bilaterally  Abdomen: Soft, nontender, no organomegaly  Musculoskeletal: No edema of the lower extremities noted   Data Reviewed: Basic Metabolic Panel:  Recent Labs Lab 06/13/15 0520 06/13/15 1702 06/14/15 0500 06/14/15 1614 06/15/15 0527 06/16/15 0540  NA 126* 127* 126* 127* 130* 129*  K 4.5 4.8 4.9 5.1 4.9 4.5  CL 95* 97* 95* 96* 101 100*  CO2 24 19* 22 22 21* 24  GLUCOSE 130* 147* 99 134* 99 93  BUN 18 20 20  24* 19 15  CREATININE 1.32* 1.25* 1.28* 1.42* 1.17* 1.17*  CALCIUM 9.2 9.3 9.2 9.0 8.7* 8.8*  MG 1.6*  --   --  1.9  --  1.7   CBC:  Recent Labs Lab 06/09/15 1850 06/12/15 0728 06/13/15 0520 06/14/15 0500 06/15/15 0527  WBC 8.2 3.9* 4.9 6.7 6.2  NEUTROABS 6.1  --   --   --   --   HGB 11.5* 10.8* 10.4* 9.1* 8.8*  HCT 34.1* 31.7* 29.8* 26.5* 26.0*  MCV 86.1 83.4 83.9 84.7 85.5  PLT 232 223 233 252 237   Cardiac Enzymes:  Recent Labs Lab 06/10/15 0001 06/10/15 0534 06/10/15 1210  TROPONINI <0.03 <0.03 <0.03   Studies: No results found.  Scheduled Meds: . acetaminophen  500 mg Oral TID  . aspirin  325 mg Oral Daily  . calcium carbonate  1 tablet Oral BID WC  . carvedilol  3.125 mg Oral BID WC  . clonazePAM  2 mg Oral QHS  . clonazePAM  4 mg Oral Daily  . clopidogrel  75 mg Oral Daily  . feeding supplement (ENSURE ENLIVE)  237 mL Oral BID BM  . hydrocortisone  10 mg Oral Daily  . hydrocortisone  5 mg Oral QHS  . magnesium sulfate 1 - 4 g bolus IVPB  3 g Intravenous Once  . omega-3 acid ethyl esters  1 g Oral Daily  .  QUEtiapine  400 mg Oral QHS  . risperiDONE  1 mg Oral TID   Continuous Infusions:    Principal Problem:   Hypotension Active Problems:   ANXIETY DEPRESSION   Carotid artery occlusion with infarction   Dizziness and giddiness   Cardiomyopathy, ischemic   Pubic ramus fracture   Fracture of multiple pubic rami   Syncope   Orthostasis   Dizziness   Hyponatremia   Fall   Adrenal insufficiency    Time spent: 35 min    [facility] MD [facility] Pager [phone]. If 7PM-7AM, please contact night-coverage at [email or web address], password [facility] 06/16/2015, 2:41 PM  LOS: 7 days

## 2015-06-16 NOTE — Progress Notes (Signed)
Central Telemetry called and notify nurse of patient having a 6 run of V tach while patient sleeping. Patient is back to sinus rhythm . Patient is responsive and denies chest pain. Floor coverage notified.

## 2015-06-17 LAB — BASIC METABOLIC PANEL
Anion gap: 5 (ref 5–15)
Anion gap: 10 (ref 5–15)
BUN: 31 mg/dL — AB (ref 6–20)
BUN: 34 mg/dL — ABNORMAL HIGH (ref 6–20)
CALCIUM: 9.2 mg/dL (ref 8.9–10.3)
CHLORIDE: 95 mmol/L — AB (ref 101–111)
CO2: 27 mmol/L (ref 22–32)
CO2: 26 mmol/L (ref 22–32)
Calcium: 9.5 mg/dL (ref 8.9–10.3)
Chloride: 94 mmol/L — ABNORMAL LOW (ref 101–111)
Creatinine, Ser: 1.35 mg/dL — ABNORMAL HIGH (ref 0.44–1.00)
Creatinine, Ser: 1.48 mg/dL — ABNORMAL HIGH (ref 0.44–1.00)
GFR calc Af Amer: 48 mL/min — ABNORMAL LOW (ref >60)
GFR calc Af Amer: 43 mL/min — ABNORMAL LOW (ref >60)
GFR calc non Af Amer: 41 mL/min — ABNORMAL LOW (ref >60)
GFR, EST NON AFRICAN AMERICAN: 37 mL/min — AB (ref >60)
GLUCOSE: 99 mg/dL (ref 65–99)
Glucose, Bld: 116 mg/dL — ABNORMAL HIGH (ref 65–99)
Potassium: 4.9 mmol/L (ref 3.5–5.1)
Potassium: 4.9 mmol/L (ref 3.5–5.1)
Sodium: 127 mmol/L — ABNORMAL LOW (ref 135–145)
Sodium: 130 mmol/L — ABNORMAL LOW (ref 135–145)

## 2015-06-17 LAB — IRON AND TIBC
Iron: 71 ug/dL (ref 28–170)
SATURATION RATIOS: 31 % (ref 10.4–31.8)
TIBC: 225 ug/dL — AB (ref 250–450)
UIBC: 154 ug/dL

## 2015-06-17 LAB — CBC
HEMATOCRIT: 26.5 % — AB (ref 36.0–46.0)
HEMOGLOBIN: 8.9 g/dL — AB (ref 12.0–15.0)
MCH: 28.8 pg (ref 26.0–34.0)
MCHC: 33.6 g/dL (ref 30.0–36.0)
MCV: 85.8 fL (ref 78.0–100.0)
Platelets: 257 10*3/uL (ref 150–400)
RBC: 3.09 MIL/uL — AB (ref 3.87–5.11)
RDW: 15.7 % — AB (ref 11.5–15.5)
WBC: 7 10*3/uL (ref 4.0–10.5)

## 2015-06-17 LAB — VITAMIN B12: Vitamin B-12: 462 pg/mL (ref 180–914)

## 2015-06-17 LAB — RETICULOCYTES
RBC.: 3.01 MIL/uL — AB (ref 3.87–5.11)
RETIC CT PCT: 1.9 % (ref 0.4–3.1)
Retic Count, Absolute: 57.2 10*3/uL (ref 19.0–186.0)

## 2015-06-17 LAB — FERRITIN: Ferritin: 43 ng/mL (ref 11–307)

## 2015-06-17 LAB — TRANSFERRIN: TRANSFERRIN: 161 mg/dL — AB (ref 192–382)

## 2015-06-17 MED ORDER — HYDROCORTISONE 5 MG PO TABS
15.0000 mg | ORAL_TABLET | Freq: Every day | ORAL | Status: DC
  Administered 2015-06-17 – 2015-06-18 (×2): 15 mg via ORAL
  Filled 2015-06-17 (×2): qty 1

## 2015-06-17 MED ORDER — SODIUM CHLORIDE 0.9 % IV SOLN
INTRAVENOUS | Status: DC
  Administered 2015-06-17 – 2015-06-18 (×2): via INTRAVENOUS

## 2015-06-17 MED ORDER — HYDROCORTISONE 10 MG PO TABS
10.0000 mg | ORAL_TABLET | Freq: Every day | ORAL | Status: DC
  Administered 2015-06-17: 10 mg via ORAL
  Filled 2015-06-17 (×2): qty 1

## 2015-06-17 NOTE — Progress Notes (Signed)
Patient continues to decline the use of TED hose.  TED hose are at the patient's bedside.  IV fluid is continuing to infuse, will repeat orthostatic vital signs upon completion.  Dayshift RN reports having difficulty with IV access thus the delay in receiving IV fluid. Will continue to monitor patient.

## 2015-06-17 NOTE — Progress Notes (Signed)
[facility] PROGRESS NOTE  Caroline [name] WHQ:[id number] DOB: 1951-08-11 DOA: 06/09/2015 PCP: [name], PA-C  Assessment/Plan: 1.   Hypotension/orthostasis Questionable etiology. Patient noted to be hypotensive over the past couple of days and had fallen secondary to dizziness and noted to be orthostatic. Patient's Avapro has been discontinued. Patient with no signs or symptoms of infection and unlikely septic. UA is negative. Chest x-ray is negative. TSH was ordered which was within normal limits at 4.311. Random plasma cortisol at 3:39 PM was 7.9. Repeat cortisol level with ACTH stim test with a baseline cortisol level of 1.4. Blood pressure improved on IV dexamethasone. NSL  IV fluids. 2-D echo with EF of 30-35% with diffuse hypokinesis grade 1 diastolic dysfunction unchanged from prior 2-D echo. Concern for possible adrenal insufficiency. Patient now also noted to be hyponatremic. Repeat cortisol level was 1.8. ACTH level was not drawn as ordered. Increase hydrocortisone 15 mg every morning and 10 mg daily at bedtime. Continue gentle hydration. If no significant improvement may need to start patient on Midrin. Outpatient follow-up.  2.  Probable chronic Hyponatremia Maybe secondary to hypovolemic hyponatremia versus adrenal insufficiency. Improved. TSH within normal limits. Patient with no signs or symptoms of infection. Urinalysis is negative.  Chest x-ray negative. Random cortisol was 7.9 at 3:39 PM. Urine osmolality is low. Serum osmolality is low. FENA= 0.53%. a.m. cortisol level was 1.4. Repeat cortisol level was 1.8. ACTH test was not drawn as ordered. Increased hydrocortisone 15 mg every morning and 10 mg daily at bedtime. Patient has been placed on gentle hydration. Follow. Repeat BMET this afternoon.  3. Probable adrenal insufficiency ACTH stim test with base cortisol level of 1.4. Cortisol level was checked 30 minutes and 90 minutes after cosyntropin was given. Cortisol  level on 06/15/2015 morning was 1.8. ACTH level was not drawn as ordered. DC IV dexamethasone. Increased hydrocortisone 15 mg every morning and 10 mg daily at bedtime. Patient will likely need outpatient follow-up with endocrinology.   4. Chronic kidney disease stage III Stable. Follow.   1. Fall- patient denies passing out, but complains of dizziness. Patient was noted to be orthostatic. Patient also with hypotension. 2-D echo with no cardiac source of emboli. Carotid Dopplers with no significant ICA stenosis. NSL IVF. Discontinued ARB. Follow. 2. History of CAD and ischemic cardio myopathy- patient's EF 30-35% on last echo from 05/18/2015. Repeat echocardiogram with EF of 30-35%, diffuse hypokinesis with 1 diastolic dysfunction with no significant change from prior 2-D echo. Continue Plavix. D/C Coreg secondary to hypotension. DC'd losartan secondary to borderline blood pressure. 3. Pubic ramus fracture- pain control, orthopedics Dr. Lorin Mercy was consulted by the ED physician. Ortho has seen the patient and recommended conservative treatment  and to follow-up as outpatient. Continue scheduled tylenol. Pain management prn. 4. DVT prophylaxis- SCDs  Code Status: Full code Family Communication: Updated patient. No family at bedside Disposition Plan: SNF when bed available.    Consultants:  Orthopedics: Dr. Lorin Mercy 06/10/2015  Procedures:  2-D echo 06/11/2015  Antibiotics:  None  HPI/Subjective: 64 y.o. female, with past medical history significant for coronary artery disease status post MI and stent placement, cardiomyopathy and carotid occlusion, presenting today status post a fall with right hip pain that happened yesterday. Patient became dizzy and fell. Patient has history of dizziness and being followed by Dr. Flonnie Overman from cardiology. Patient denies any preceding chest pains , palpitations or shortness of breath. Patient denies any chest pain or shortness of breath. Has been walking with  the help of walker   Patient with no CP. No SOB. Patient noted to be dizzy with orthostasis last night. Patient currently on gentle hydration.  Objective: Filed Vitals:   06/17/15 0420  BP:   Pulse:   Temp: 97.6 F (36.4 C)  Resp:    No intake or output data in the 24 hours ending 06/17/15 1104 Filed Weights   06/09/15 1756 06/09/15 2242  Weight: 52.164 kg (115 lb) 49.442 kg (109 lb)    Exam:   General:  Appears in no acute distress  Cardiovascular: S1-S2 regular  Respiratory: Clear to auscultation bilaterally  Abdomen: Soft, nontender, no organomegaly  Musculoskeletal: No edema of the lower extremities noted   Data Reviewed: Basic Metabolic Panel:  Recent Labs Lab 06/13/15 0520  06/14/15 0500 06/14/15 1614 06/15/15 0527 06/16/15 0540 06/17/15 0752  NA 126*  < > 126* 127* 130* 129* 127*  K 4.5  < > 4.9 5.1 4.9 4.5 4.9  CL 95*  < > 95* 96* 101 100* 95*  CO2 24  < > 22 22 21* 24 27  GLUCOSE 130*  < > 99 134* 99 93 99  BUN 18  < > 20 24* 19 15 31*  CREATININE 1.32*  < > 1.28* 1.42* 1.17* 1.17* 1.35*  CALCIUM 9.2  < > 9.2 9.0 8.7* 8.8* 9.2  MG 1.6*  --   --  1.9  --  1.7  --   < > = values in this interval not displayed. CBC:  Recent Labs Lab 06/12/15 0728 06/13/15 0520 06/14/15 0500 06/15/15 0527 06/17/15 0752  WBC 3.9* 4.9 6.7 6.2 7.0  HGB 10.8* 10.4* 9.1* 8.8* 8.9*  HCT 31.7* 29.8* 26.5* 26.0* 26.5*  MCV 83.4 83.9 84.7 85.5 85.8  PLT 223 233 252 237 257   Cardiac Enzymes:  Recent Labs Lab 06/10/15 1210  TROPONINI <0.03   Studies: No results found.  Scheduled Meds: . acetaminophen  500 mg Oral TID  . aspirin  325 mg Oral Daily  . calcium carbonate  1 tablet Oral BID WC  . clonazePAM  2 mg Oral QHS  . clonazePAM  4 mg Oral Daily  . clopidogrel  75 mg Oral Daily  . feeding supplement (ENSURE ENLIVE)  237 mL Oral BID BM  . hydrocortisone  10 mg Oral QHS  . hydrocortisone  15 mg Oral Daily  . omega-3 acid ethyl esters  1 g Oral Daily   . QUEtiapine  400 mg Oral QHS   And  . QUEtiapine  300 mg Oral QHS  . risperiDONE  1 mg Oral TID   Continuous Infusions: . sodium chloride 75 mL/hr at 06/17/15 0981    Principal Problem:   Hypotension Active Problems:   ANXIETY DEPRESSION   Carotid artery occlusion with infarction   Dizziness and giddiness   Cardiomyopathy, ischemic   Pubic ramus fracture   Fracture of multiple pubic rami   Syncope   Orthostasis   Dizziness   Hyponatremia   Fall   Adrenal insufficiency    Time spent: 25 min    [facility] MD [facility] Pager [phone]. If 7PM-7AM, please contact night-coverage at [email or web address], password [facility] 06/17/2015, 11:04 AM  LOS: 8 days

## 2015-06-17 NOTE — Progress Notes (Signed)
Nurse tech [place] notified that pt refuse the TED hose. Notified Dr. Cheri Fowler.

## 2015-06-18 DIAGNOSIS — S32509S Unspecified fracture of unspecified pubis, sequela: Secondary | ICD-10-CM

## 2015-06-18 DIAGNOSIS — I255 Ischemic cardiomyopathy: Secondary | ICD-10-CM

## 2015-06-18 LAB — BASIC METABOLIC PANEL WITH GFR
Anion gap: 5 (ref 5–15)
BUN: 33 mg/dL — ABNORMAL HIGH (ref 6–20)
CO2: 27 mmol/L (ref 22–32)
Calcium: 9.1 mg/dL (ref 8.9–10.3)
Chloride: 96 mmol/L — ABNORMAL LOW (ref 101–111)
Creatinine, Ser: 1.25 mg/dL — ABNORMAL HIGH (ref 0.44–1.00)
GFR calc Af Amer: 52 mL/min — ABNORMAL LOW
GFR calc non Af Amer: 45 mL/min — ABNORMAL LOW
Glucose, Bld: 113 mg/dL — ABNORMAL HIGH (ref 65–99)
Potassium: 4.8 mmol/L (ref 3.5–5.1)
Sodium: 128 mmol/L — ABNORMAL LOW (ref 135–145)

## 2015-06-18 LAB — CBC
HCT: 25.9 % — ABNORMAL LOW (ref 36.0–46.0)
Hemoglobin: 8.7 g/dL — ABNORMAL LOW (ref 12.0–15.0)
MCH: 29 pg (ref 26.0–34.0)
MCHC: 33.6 g/dL (ref 30.0–36.0)
MCV: 86.3 fL (ref 78.0–100.0)
Platelets: 279 10*3/uL (ref 150–400)
RBC: 3 MIL/uL — ABNORMAL LOW (ref 3.87–5.11)
RDW: 15.9 % — ABNORMAL HIGH (ref 11.5–15.5)
WBC: 7 10*3/uL (ref 4.0–10.5)

## 2015-06-18 MED ORDER — HYDROCORTISONE 5 MG PO TABS: 15.0000 mg | ORAL_TABLET | Freq: Every day | ORAL | Status: DC

## 2015-06-18 MED ORDER — HYDROCORTISONE 10 MG PO TABS: 10.0000 mg | ORAL_TABLET | Freq: Every day | ORAL | Status: DC

## 2015-06-18 MED ORDER — ENSURE ENLIVE PO LIQD: 237.0000 mL | Freq: Two times a day (BID) | ORAL | Status: DC

## 2015-06-18 MED ORDER — TRAMADOL HCL 50 MG PO TABS: 50.0000 mg | ORAL_TABLET | Freq: Two times a day (BID) | ORAL | Status: DC | PRN

## 2015-06-18 MED ORDER — HYDROCODONE-ACETAMINOPHEN 5-325 MG PO TABS: 1.0000 | ORAL_TABLET | ORAL | Status: DC | PRN

## 2015-06-18 MED ORDER — SENNOSIDES-DOCUSATE SODIUM 8.6-50 MG PO TABS: 1.0000 | ORAL_TABLET | Freq: Every evening | ORAL | Status: DC | PRN

## 2015-06-18 MED ORDER — CLONAZEPAM 2 MG PO TBDP: 2.0000 mg | ORAL_TABLET | Freq: Two times a day (BID) | ORAL | Status: DC

## 2015-06-18 NOTE — Clinical Social Work Note (Signed)
Clinical Social Work Assessment  Patient Details  Name: Caroline [name] MRN: [id number] Date of Birth: 1951/07/12  Date of referral:  06/18/15               Reason for consult:  Facility Placement                Permission sought to share information with:  Chartered certified accountant granted to share information::  Yes, Verbal Permission Granted  Name::        Agency::   ([facility])  Relationship::     Contact Information:     Housing/Transportation Living arrangements for the past 2 months:  Single Family Home Source of Information:  Patient Patient Interpreter Needed:  None Criminal Activity/Legal Involvement Pertinent to Current Situation/Hospitalization:  No - Comment as needed Significant Relationships:  Adult Children Lives with:  Self Do you feel safe going back to the place where you live?  No (fall risk) Need for family participation in patient care:  No (Coment)  Care giving concerns:  No caregivers at bedside during assessment   Social Worker assessment / plan:  CSW assessed patient at bedside.  PT recommendations are SNF for STR at time of discharge.  Patient is aware and agreeable to this disposition. Patient states she will need EMS transportation upon discharge.  Patient also reports having no family members to assist with admission's paperwork at the SNF prior to arrival.  This information was relayed to the admission's liaison at [facility] where the patient wishes to receive STR.  Patient states she is from home alone with no supports.  Employment status:  Retired Nurse, adult PT Recommendations:  [address] [place], [place] / Referral to community resources:  [place]  Patient/Family's Response to care:  Patient is agreeable to SNF/STR  Patient/Family's Understanding of and Emotional Response to Diagnosis, Current Treatment, and Prognosis:  Patient is very realistic  regarding level of care needed at time of discharge  Emotional Assessment Appearance:  Appears stated age Attitude/Demeanor/Rapport:   (appropriate) Affect (typically observed):  Appropriate, Adaptable, Accepting Orientation:  Oriented to Self, Oriented to Place, Oriented to  Time, Oriented to Situation Alcohol / Substance use:  Not Applicable Psych involvement (Current and /or in the community):  No (Comment)  Discharge Needs  Concerns to be addressed:  No discharge needs identified Readmission within the last 30 days:    Current discharge risk:  None Barriers to Discharge:  No Barriers Identified   Dulcy Fanny, LCSW 06/18/2015, 12:06 PM

## 2015-06-18 NOTE — Care Management Important Message (Signed)
Important Message  Patient Details  Name: Caroline [name] MRN: [id number] Date of Birth: 1950/12/29   Medicare Important Message Given:  Yes-third notification given    Delorse Lek 06/18/2015, 3:49 PM

## 2015-06-18 NOTE — Clinical Social Work Note (Signed)
CSW has submitted clinicals to [facility] for SNF authorization.  CSW sent clinical information to SNF for admission review.  Please note: bed is available once patient is ready to discharge.  Nonnie Done, LCSW [phone]  Psychiatric & Orthopedics (5N 1-8) Clinical Social Worker

## 2015-06-18 NOTE — Clinical Social Work Placement (Signed)
   CLINICAL SOCIAL WORK PLACEMENT  NOTE  Date:  06/18/2015  Patient Details  Name: Caroline [name] MRN: [id number] Date of Birth: 07-20-1951  Clinical Social Work is seeking post-discharge placement for this patient at the [place] level of care (*CSW will initial, date and re-position this form in  chart as items are completed):  Yes   Patient/family provided with [facility]'s list of facilities offering this level of care within the geographic area requested by the patient (or if unable, by the patient's family).  Yes   Patient/family informed of their freedom to choose among providers that offer the needed level of care, that participate in Medicare, Medicaid or managed care program needed by the patient, have an available bed and are willing to accept the patient.  Yes   Patient/family informed of [facility] [facility]'s ownership interest in [facility] and [facility], as well as of the fact that they are under no obligation to receive care at these facilities.  PASRR submitted to [employer] on 06/18/15     PASRR number received on 06/18/15     Existing PASRR number confirmed on       FL2 transmitted to all facilities in geographic area requested by pt/family on 06/18/15     FL2 transmitted to all facilities within larger geographic area on       Patient informed that his/her managed care company has contracts with or will negotiate with certain facilities, including the following:        Yes   Patient/family informed of bed offers received.  Patient chooses bed at [facility] and [place] recommends and patient chooses bed at      Patient to be transferred to [facility] on  .  Patient to be transferred to facility by       Patient family notified on   of transfer.  Name of family member notified:        PHYSICIAN       Additional Comment:     _______________________________________________ Dulcy Fanny, LCSW 06/18/2015, 12:09 PM

## 2015-06-18 NOTE — Discharge Planning (Signed)
Patient will discharge today per MD order. Patient will discharge to: [facility] RN to call report prior to transportation to: [phone] Transportation: [facility]- to be scheduled after insurance authorization received  CSW sent discharge summary to SNF for review.  Packet is complete.  RN, patient and family aware of discharge plans.  Nonnie Done, [place] [phone]  Psychiatric & Orthopedics (5N 1-16) Clinical Social Worker

## 2015-06-18 NOTE — Discharge Summary (Signed)
Physician Discharge Summary  Caroline [name] WJX:[id number] DOB: 1951/01/27 DOA: 06/09/2015  PCP: Caroline Rio, PA-C  Admit date: 06/09/2015 Discharge date: 06/18/2015  Time spent: 65 minutes  Recommendations for Outpatient Follow-up:  1. Follow-up with M.D. at skilled nursing facility. Patient needs a basic metabolic profile done in about one week to follow-up on electrolytes and renal function. If patient's blood pressure is stable and able to tolerate may consider resuming patient's ARB.  2. Patient will need outpatient referral to endocrinology secondary to probable adrenal insufficiency for further evaluation and management.  Discharge Diagnoses:  Principal Problem:   Hypotension Active Problems:   Pubic ramus fracture   Hyponatremia   Adrenal insufficiency   ANXIETY DEPRESSION   Carotid artery occlusion with infarction   Dizziness and giddiness   Cardiomyopathy, ischemic   Fracture of multiple pubic rami   Orthostasis   Dizziness   Fall   Discharge Condition: Stable and improved  Diet recommendation: Heart healthy  Filed Weights   06/09/15 1756 06/09/15 2242  Weight: 52.164 kg (115 lb) 49.442 kg (109 lb)    History of present illness:  Per Dr Caroline [name] is a 64 y.o. female, with past medical history significant for coronary artery disease status post MI and stent placement, cardiomyopathy and carotid occlusion, presenting on day of admission,  status post a fall with right hip pain that happened 1 day prior to admission. Patient became dizzy and fell. Patient has history of dizziness and being followed by Dr. Flonnie Overman from cardiology. Patient denied any preceding chest pains , palpitations or shortness of breath .  Hospital Course:  1. Hypotension/orthostasis Questionable etiology. Patient noted to be hypotensive during the hospitalization and had fallen secondary to dizziness and noted to be orthostatic. Patient's Avapro was discontinued. Patient with no  signs or symptoms of infection and unlikely septic. UA is negative. Chest x-ray is negative. TSH was ordered which was within normal limits at 4.311. Random plasma cortisol at 3:39 PM was 7.9. Repeat cortisol level with ACTH stim test with a baseline cortisol level of 1.4. Blood pressure improved on IV dexamethasone and IV fluids. 2-D echo with EF of 30-35% with diffuse hypokinesis grade 1 diastolic dysfunction unchanged from prior 2-D echo. Concern for possible adrenal insufficiency. Patient also noted to be hyponatremic. Repeat cortisol level was 1.8. ACTH level was not drawn as ordered. Patient was transitioned from IV dexamethasone to oral hydrocortisone and dose titrated up to hydrocortisone 15 mg every morning and 10 mg daily at bedtime. Patient's blood pressure improved and orthostasis had resolved by day of discharge. Patient be discharged in stable and improved condition will follow-up with M.D. at skilled nursing facility. Patient also benefit from a referral to outpatient endocrinology.   2. Probable chronic Hyponatremia Maybe secondary to hypovolemic hyponatremia and probable adrenal insufficiency. Patient noted to be hyponatremic during the hospitalization. TSH within normal limits. Patient with no signs or symptoms of infection. Urinalysis is negative. Chest x-ray negative. Also status was checked and patient was noted to be orthostatic. Patient's Avapro was discontinued. Patient was hydrated with IV fluids. Patient was also initially placed on IV dexamethasone while he cosyntropin stimulation test was done. Random cortisol was 7.9 at 3:39 PM. Urine osmolality is low. Serum osmolality is low. FENA= 0.53%. a.m. cortisol level was 1.4. Repeat cortisol level was 1.8. ACTH test was not drawn as ordered. Patient was subsequently transitioned from IV dexamethasone to oral hydrocortisone and dose titrated up hydrocortisone 15 mg every morning and  10 mg daily at bedtime. Patient was also hydrated gently  with improvement in her hyponatremia. Patient be discharged in stable and improved condition on a to follow-up as outpatient.   3. Probable adrenal insufficiency ACTH stim test with base cortisol level of 1.4. Cortisol level was checked 30 minutes and 90 minutes after cosyntropin was given. Cortisol level on 06/15/2015 morning was 1.8. ACTH level was not drawn as ordered. Patient was initially placed on IV dexamethasone well ACTH stimulation test was done. Once this was completed patient was transitioned from IV dexamethasone to oral hydrocortisone and dose uptitrated to hydrocortisone 15 mg every morning and 10 mg daily at bedtime. Patient will likely need outpatient follow-up with endocrinology.   4. Chronic kidney disease stage III Stable. Follow.   5. Fall Patient had presented with a fall. Patient denied any syncopal episode but did complain of dizziness. Patient was noted to be orthostatic and also hypotensive. See problem #1. Patient was seen by PT OT and did not have any falls during this hospitalization. PT/OT. Patient be discharged to a skilled nursing facility.  6. History of coronary artery disease/ischemic cardiomyopathy Patient's EF was 30-35% Pravachol 05/18/2015. Repeat echo had 8 year for 30-35% diffuse hypokinesis with grade 1 diastolic dysfunction with no significant change from prior 2-D echo. Patient was maintained on a home regimen of Plavix and Coreg. ARB was discontinued secondary to hypotension. Patient need to follow-up as outpatient.  7 pubic ramus fracture Secondary to fall. Patient was seen in consultation by Dr. Liz Beach, Dr. Lorin Mercy who had recommended conservative treatment and outpatient follow-up. Patient was placed on scheduled Tylenol and pain management. Patient will follow-up with orthopedics in 2-3 weeks.  The rest of patient's chronic medical issues remained stable throughout the hospitalization and patient be discharged in stable and improved  condition.  Procedures:  2-D echo 06/11/2015  Consultations:  Orthopedics: Dr. Lorin Mercy 06/10/2015    Discharge Exam: Filed Vitals:   06/18/15 1244  BP: 118/60  Pulse: 89  Temp: 98.6 F (37 C)  Resp: 18    General: NAD Cardiovascular:RRR Respiratory: CTAB  Discharge Instructions   Discharge Instructions    Diet - low sodium heart healthy    Complete by:  As directed      Discharge instructions    Complete by:  As directed   Follow up with MD at SNF. Will need referal to see endocrinologist as outpatient in 2-3 weeks. Follow up with Dr Lorin Mercy in 2-3 weeks.     Increase activity slowly    Complete by:  As directed           Current Discharge Medication List    START taking these medications   Details  feeding supplement, ENSURE ENLIVE, (ENSURE ENLIVE) LIQD Take 237 mLs by mouth 2 (two) times daily between meals. Qty: 237 mL, Refills: 12    HYDROcodone-acetaminophen (NORCO/VICODIN) 5-325 MG per tablet Take 1-2 tablets by mouth every 4 (four) hours as needed for moderate pain. Qty: 20 tablet, Refills: 0    !! hydrocortisone (CORTEF) 10 MG tablet Take 1 tablet (10 mg total) by mouth at bedtime. Qty: 30 tablet, Refills: 0    !! hydrocortisone (CORTEF) 5 MG tablet Take 3 tablets (15 mg total) by mouth daily. Qty: 30 tablet, Refills: 0    senna-docusate (SENOKOT-S) 8.6-50 MG per tablet Take 1 tablet by mouth at bedtime as needed for mild constipation.     !! - Potential duplicate medications found. Please discuss with provider.  CONTINUE these medications which have CHANGED   Details  clonazepam (KLONOPIN) 2 MG disintegrating tablet Take 1-2 tablets (2-4 mg total) by mouth 2 (two) times daily. Takes 4mg  in am and 2mg  in pm Qty: 20 tablet, Refills: 0    traMADol (ULTRAM) 50 MG tablet Take 1 tablet (50 mg total) by mouth every 12 (twelve) hours as needed for severe pain. Qty: 20 tablet, Refills: 0   Associated Diagnoses: Osteoarthritis of thoracolumbar spine,  unspecified spinal osteoarthritis      CONTINUE these medications which have NOT CHANGED   Details  aspirin 325 MG tablet Take 325 mg by mouth daily.    Black Cohosh 540 MG CAPS Take 540 mg by mouth 2 (two) times daily.    carvedilol (COREG) 3.125 MG tablet Take 1 tablet (3.125 mg total) by mouth 2 (two) times daily. Qty: 180 tablet, Refills: 3    clopidogrel (PLAVIX) 75 MG tablet Take 1 tablet (75 mg total) by mouth daily. Qty: 30 tablet, Refills: 11    fish oil-omega-3 fatty acids 1000 MG capsule Take 1 g by mouth daily.     QUEtiapine (SEROQUEL XR) 300 MG 24 hr tablet Take 300 mg by mouth at bedtime.    QUEtiapine (SEROQUEL) 400 MG tablet Take 400 mg by mouth at bedtime.     risperiDONE (RISPERDAL) 2 MG tablet Take 1 mg by mouth 3 (three) times daily.      STOP taking these medications     losartan (COZAAR) 25 MG tablet        No Active Allergies Follow-up Information    Follow up with Marybelle Killings, MD On 07/03/2015.   Specialty:  Orthopedic Surgery   Why:  9:15 A.M.   Contact information:   [place] [address] [phone]       Follow up with [facility].   Why:  Someone from [facility] will contact you concerning start date and time for therapy.   Contact information:   [address] [place] [address] [phone]       Please follow up.   Why:  f/u with MD at SNF      Please follow up.   Why:  Needs referal to endiocrinology in 2-3 weeks       The results of significant diagnostics from this hospitalization (including imaging, microbiology, ancillary and laboratory) are listed below for reference.    Significant Diagnostic Studies: Ct Head Wo Contrast  06/09/2015   CLINICAL DATA:  Became dizzy and fell yesterday striking head, on Plavix, history breast cancer 2002, CHF, hypertension, coronary artery disease post MI, atrial fibrillation, carotid occlusion  EXAM: CT HEAD WITHOUT CONTRAST   TECHNIQUE: Contiguous axial images were obtained from the base of the skull through the vertex without intravenous contrast.  COMPARISON:  05/17/2014  FINDINGS: Generalized atrophy.  Normal ventricular morphology.  No midline shift or mass effect.  Small vessel chronic ischemic changes of deep cerebral white matter.  No intracranial hemorrhage, mass lesion, or acute infarction.  Visualized paranasal sinuses and mastoid air cells clear.  Bones unremarkable.  Atherosclerotic calcifications of internal carotid and vertebral arteries at skullbase.  IMPRESSION: Atrophy with small vessel chronic ischemic changes of deep cerebral white matter.  No acute intracranial abnormalities.   Electronically Signed   By: Lavonia Dana M.D.   On: 06/09/2015 19:53   Dg Chest Port 1 View  06/12/2015   CLINICAL DATA:  Hypotension with recent fall  EXAM:  PORTABLE CHEST - 1 VIEW  COMPARISON:  April 17, 2012  FINDINGS: There is no edema or consolidation. Heart size and pulmonary vascularity are normal. No adenopathy. A coronary stent is noted on the left. There are surgical clips in the right breast region.  IMPRESSION: No edema or consolidation.   Electronically Signed   By: Lowella Grip III M.D.   On: 06/12/2015 21:46   Dg Hip Unilat With Pelvis 2-3 Views Right  06/09/2015   CLINICAL DATA:  64 year old female with a history of dizziness  EXAM: DG HIP (WITH OR WITHOUT PELVIS) 2-3V RIGHT  COMPARISON:  None.  FINDINGS: Diffuse osteopenia.  Fracture of the inferior right pubic ramus.  Left hip arthroplasty.  Degenerative changes of the lumbar spine.  Vascular calcifications.  Unremarkable appearance of the proximal right femur.  IMPRESSION: Nondisplaced fracture of the right inferior pubic ramus.  Surgical changes of left hip arthroplasty.  Osteopenia.  Atherosclerosis.  Signed,  Dulcy Fanny. Earleen Newport, DO  [facility]  [facility]   Electronically Signed   By: Corrie Mckusick D.O.   On: 06/09/2015  20:58    Microbiology: Recent Results (from the past 240 hour(s))  Culture, Urine     Status: None   Collection Time: 06/12/15 11:49 PM  Result Value Ref Range Status   Specimen Description URINE, RANDOM  Final   Special Requests NONE  Final   Culture MULTIPLE SPECIES PRESENT, SUGGEST RECOLLECTION  Final   Report Status 06/14/2015 FINAL  Final     Labs: Basic Metabolic Panel:  Recent Labs Lab 06/13/15 0520  06/14/15 1614 06/15/15 0527 06/16/15 0540 06/17/15 0752 06/17/15 1657 06/18/15 0446  NA 126*  < > 127* 130* 129* 127* 130* 128*  K 4.5  < > 5.1 4.9 4.5 4.9 4.9 4.8  CL 95*  < > 96* 101 100* 95* 94* 96*  CO2 24  < > 22 21* 24 27 26 27   GLUCOSE 130*  < > 134* 99 93 99 116* 113*  BUN 18  < > 24* 19 15 31* 34* 33*  CREATININE 1.32*  < > 1.42* 1.17* 1.17* 1.35* 1.48* 1.25*  CALCIUM 9.2  < > 9.0 8.7* 8.8* 9.2 9.5 9.1  MG 1.6*  --  1.9  --  1.7  --   --   --   < > = values in this interval not displayed. Liver Function Tests: No results for input(s): AST, ALT, ALKPHOS, BILITOT, PROT, ALBUMIN in the last 168 hours. No results for input(s): LIPASE, AMYLASE in the last 168 hours. No results for input(s): AMMONIA in the last 168 hours. CBC:  Recent Labs Lab 06/13/15 0520 06/14/15 0500 06/15/15 0527 06/17/15 0752 06/18/15 0446  WBC 4.9 6.7 6.2 7.0 7.0  HGB 10.4* 9.1* 8.8* 8.9* 8.7*  HCT 29.8* 26.5* 26.0* 26.5* 25.9*  MCV 83.9 84.7 85.5 85.8 86.3  PLT 233 252 237 257 279   Cardiac Enzymes: No results for input(s): CKTOTAL, CKMB, CKMBINDEX, TROPONINI in the last 168 hours. BNP: BNP (last 3 results) No results for input(s): BNP in the last 8760 hours.  ProBNP (last 3 results) No results for input(s): PROBNP in the last 8760 hours.  CBG: No results for input(s): GLUCAP in the last 168 hours.     SignedIrine Seal MD [facility] 06/18/2015, 3:16 PM

## 2015-06-18 NOTE — Progress Notes (Signed)
Pt ready for d/c per MD. Pt taken off tele and peripheral IV removed. Report called to Lelon Frohlich at [facility]. Pts home meds stored in our pharmacy will be sent with EMS to give to nurse at facility. Will be transferred via [facility].   Raquel James 06/18/2015 5:05 PM

## 2015-06-18 NOTE — Progress Notes (Signed)
Physical Therapy Treatment Patient Details Name: Caroline [name] MRN: [id number] DOB: 11/29/1950 Today's Date: 06/18/2015    History of Present Illness Pt is a 64 y/o F s/p fall after becoming dizzy w/ resultant R pubic ramus fx.  Pt's PMH includes CAD, MI, SOB, R breast cancer, anxiety, CHF, angina, HTN, depression, a fib.    PT Comments    Patient participating more this session with therapy. She still limiting the amount that she will walk and walking continues to be at a very slow speed. Patient able to work in some therex while sitting up in the recliner. Continue to recommend SNF for ongoing Physical Therapy.     Follow Up Recommendations  Supervision for mobility/OOB;SNF     Equipment Recommendations  Rolling walker with 5" wheels (youth size)    Recommendations for Other Services       Precautions / Restrictions Precautions Precautions: Fall    Mobility  Bed Mobility Overal bed mobility: Modified Independent                Transfers Overall transfer level: Needs assistance Equipment used: Rolling walker (2 wheeled)   Sit to Stand: Min guard         General transfer comment: patient tends to pull up on RW with BUE vs. pushing up from the bed  Ambulation/Gait Ambulation/Gait assistance: Min assist Ambulation Distance (Feet): 30 Feet Assistive device: Rolling walker (2 wheeled) Gait Pattern/deviations: Step-to pattern;Shuffle;Decreased stance time - right;Decreased step length - left Gait velocity: decreased Gait velocity interpretation: Below normal speed for age/gender General Gait Details: Patient continues to shuffle LEs with gait, increased trunk flexion with cues to look forward and maintain upright posture. Better safety and balance with turning noted this session   Stairs            Wheelchair Mobility    Modified Rankin (Stroke Patients Only)       Balance                                    Cognition  Arousal/Alertness: Awake/alert Behavior During Therapy: Flat affect Overall Cognitive Status: No family/caregiver present to determine baseline cognitive functioning                      Exercises General Exercises - Lower Extremity Long Arc Quad: AROM;Both;10 reps Hip Flexion/Marching: AROM;Both;10 reps;Limitations Hip Flexion/Marching Limitations: decreased ROM    General Comments        Pertinent Vitals/Pain Pain Score: 6  Pain Location: R leg Pain Descriptors / Indicators: Sore Pain Intervention(s): Limited activity within patient's tolerance;Repositioned    Home Living                      Prior Function            PT Goals (current goals can now be found in the care plan section) Progress towards PT goals: Progressing toward goals    Frequency  Min 3X/week    PT Plan Current plan remains appropriate    Co-evaluation             End of Session   Activity Tolerance: Patient limited by fatigue;Patient limited by pain Patient left: in chair;with call bell/phone within reach     Time: 1038-1105 PT Time Calculation (min) (ACUTE ONLY): 27 min  Charges:  $Gait Training: 8-22 mins $Therapeutic Exercise: 8-22 mins  G Codes:      Jacqualyn Posey 06/18/2015, 11:11 AM  06/18/2015 Jacqualyn Posey PTA [phone] pager [phone] office

## 2015-06-19 ENCOUNTER — Encounter: Payer: Self-pay | Admitting: Internal Medicine

## 2015-06-19 ENCOUNTER — Non-Acute Institutional Stay (SKILLED_NURSING_FACILITY): Payer: Medicare Other | Admitting: Internal Medicine

## 2015-06-19 DIAGNOSIS — I255 Ischemic cardiomyopathy: Secondary | ICD-10-CM | POA: Diagnosis not present

## 2015-06-19 DIAGNOSIS — I951 Orthostatic hypotension: Secondary | ICD-10-CM

## 2015-06-19 DIAGNOSIS — E871 Hypo-osmolality and hyponatremia: Secondary | ICD-10-CM | POA: Diagnosis not present

## 2015-06-19 DIAGNOSIS — E2740 Unspecified adrenocortical insufficiency: Secondary | ICD-10-CM

## 2015-06-19 DIAGNOSIS — N189 Chronic kidney disease, unspecified: Secondary | ICD-10-CM | POA: Insufficient documentation

## 2015-06-19 DIAGNOSIS — N183 Chronic kidney disease, stage 3 unspecified: Secondary | ICD-10-CM

## 2015-06-19 DIAGNOSIS — S32599S Other specified fracture of unspecified pubis, sequela: Secondary | ICD-10-CM

## 2015-06-19 DIAGNOSIS — F341 Dysthymic disorder: Secondary | ICD-10-CM | POA: Diagnosis not present

## 2015-06-19 DIAGNOSIS — E785 Hyperlipidemia, unspecified: Secondary | ICD-10-CM

## 2015-06-19 DIAGNOSIS — S32509S Unspecified fracture of unspecified pubis, sequela: Secondary | ICD-10-CM

## 2015-06-19 LAB — FOLATE RBC
Folate, RBC: >2263 ng/mL (ref >498)
HEMATOCRIT: 27.4 % — AB (ref 34.0–46.6)

## 2015-06-19 NOTE — Assessment & Plan Note (Signed)
ACTH stim test with base cortisol level of 1.4. Cortisol level was checked 30 minutes and 90 minutes after cosyntropin was given. Cortisol level on 06/15/2015 morning was 1.8. ACTH level was not drawn as ordered. Patient was initially placed on IV dexamethasone well ACTH stimulation test was done. Once this was completed patient was transitioned from IV dexamethasone to oral hydrocortisone and dose uptitrated to hydrocortisone 15 mg every morning and 10 mg daily at bedtime.SNF plan - cont hydrocortisine, follow BP, outpt refer to endocrine

## 2015-06-19 NOTE — Assessment & Plan Note (Signed)
SNF plan - cont omega 3 FA

## 2015-06-19 NOTE — Assessment & Plan Note (Signed)
SNF plan - cont klonopin and mood stablizers of seroquel and risperdol

## 2015-06-19 NOTE — Assessment & Plan Note (Signed)
Questionable etiology. Patient noted to be hypotensive during the hospitalization and had fallen secondary to dizziness and noted to be orthostatic. Patient's Avapro was discontinued. Patient with no signs or symptoms of infection and unlikely septic. UA is negative. Chest x-ray is negative. TSH was ordered which was within normal limits at 4.311. Random plasma cortisol at 3:39 PM was 7.9. Repeat cortisol level with ACTH stim test with a baseline cortisol level of 1.4. Blood pressure improved on IV dexamethasone and IV fluids. 2-D echo with EF of 30-35% with diffuse hypokinesis grade 1 diastolic dysfunction unchanged from prior 2-D echo. Concern for possible adrenal insufficiency. Patient also noted to be hyponatremic. Repeat cortisol level was 1.8. ACTH level was not drawn as ordered. Patient was transitioned from IV dexamethasone to oral hydrocortisone and dose titrated up to hydrocortisone 15 mg every morning and 10 mg daily at bedtime.SNF plan - cont oral hydrocortisine, refer to endocrine,

## 2015-06-19 NOTE — Assessment & Plan Note (Signed)
Maybe secondary to hypovolemic hyponatremia and probable adrenal insufficiency. Patient noted to be hyponatremic during the hospitalization. TSH within normal limits. Patient with no signs or symptoms of infection. Urinalysis is negative. Chest x-ray negative. Also status was checked and patient was noted to be orthostatic. Patient's Avapro was discontinued. Patient was hydrated with IV fluids. Patient was also initially placed on IV dexamethasone while he cosyntropin stimulation test was done. Random cortisol was 7.9 at 3:39 PM. Urine osmolality is low. Serum osmolality is low. FENA= 0.53%. a.m. cortisol level was 1.4. Repeat cortisol level was 1.8. ACTH test was not drawn as ordered. Patient was subsequently transitioned from IV dexamethasone to oral hydrocortisone and dose titrated up hydrocortisone 15 mg every morning and 10 mg daily at bedtime SNF plan - cont oral hydrocortisone, follw Na+ with serial BMP, endocrine f/u as out pt

## 2015-06-19 NOTE — Assessment & Plan Note (Signed)
Secondary to fall. Patient was seen in consultation by Dr. Liz Beach, Dr. Lorin Mercy who had recommended conservative treatment and outpatient follow-up SNF plan - OT/PT, outpt f/u

## 2015-06-19 NOTE — Progress Notes (Signed)
MRN: [id number] Name: Caroline [name]  Sex: female Age: 64 y.o. DOB: January 03, 1951  [place] #: [name] Facility/Room:509 Level Of Care: SNF Provider: Inocencio Homes D Emergency Contacts: Extended Emergency Contact Information Primary Emergency Contact: [place] of [place] Phone: [phone] Relation: Daughter Secondary Emergency Contact: [place] of [place] Mobile Phone: [phone] Relation: Other  Code Status:   Allergies: Review of patient's allergies indicates no active allergies.  Chief Complaint  Patient presents with  . New Admit To SNF    HPI: Patient is 64 y.o. female with past medical history significant for coronary artery disease status post MI and stent placement, cardiomyopathy and carotid occlusion, presenting on day of admission, status post a fall with right hip pain that happened 1 day prior to admission. Patient became dizzy and fell. Patient has history of dizziness and being followed by Dr. Flonnie Overman from cardiology. Patient denied any preceding chest pains , palpitations or shortness of breath Pt was hospitalized from 8/6-15 for work-up and tx of orthostatic hypotension and hypotension felt to be probably chronic and both due to suspected adrenal insufficiency which will require continued tx with steroids and follow up as ou tpt with endocrinology. Pt also sustained a pubic ramus fx when she fell. Pt is admitted to SNF for generalized weakness, for continued tx of adrenal insufficiency and for OT/PT of pubic ramus fx. While as SNT pt will be followed for chronic cardiomyopathy with EF 30-35%, unchanged, tx with  lasix,coreg and ARB if/when hypotension improves, CAD with ASA ,tx with  plavix and coreg and hyperlipidemia with fish oil.  Past Medical History  Diagnosis Date  . GERD (gastroesophageal reflux disease)   . Dyslipidemia   . CAD (coronary artery disease)     [facility] instent restenosis treated  10/2005  . Myocardial infarction     3 stents 1st 2004, has had several  . Heart murmur     states years ago  . Shortness of breath     with exertion  . Bright disease   . Cancer     right breast 2001-02-13  . Anxiety   . Arthritis     back  . CHF (congestive heart failure)     states she was told se had this in past by MD  . Angina   . Hypertension     states a long time ago-states she never took medication  . Depression     02/13/02 had shock treatment due to depresson, spouse passed away 02-14-00  . Carotid artery occlusion   . Atrial fibrillation   . Grave's disease     pt denies    Past Surgical History  Procedure Laterality Date  . Breast lumpectomy      right breast   . Coronary angioplasty with stent placement    . Joint replacement  February 13, 2010    Left total hip replacement  . Abdominal hysterectomy      partial  . Mouth surgery    . Endarterectomy  01/08/2012    Procedure: ENDARTERECTOMY CAROTID;  Surgeon: Mal Misty, MD;  Location: [place];  Service: Vascular;  Laterality: Left;  with Dacron patch angioplasty  . Carotid endarterectomy  01/08/12    Left  . Fracture surgery  02/13/06    Broken Back      Medication List       This list is accurate as of: 06/19/15 11:59 PM.  Always use your most recent med  list.               aspirin 325 MG tablet  Take 325 mg by mouth daily.     Black Cohosh 540 MG Caps  Take 540 mg by mouth 2 (two) times daily.     carvedilol 3.125 MG tablet  Commonly known as:  COREG  Take 1 tablet (3.125 mg total) by mouth 2 (two) times daily.     clonazepam 2 MG disintegrating tablet  Commonly known as:  KLONOPIN  Take 1-2 tablets (2-4 mg total) by mouth 2 (two) times daily. Takes 4mg  in am and 2mg  in pm     clopidogrel 75 MG tablet  Commonly known as:  PLAVIX  Take 1 tablet (75 mg total) by mouth daily.     feeding supplement (ENSURE ENLIVE) Liqd  Take 237 mLs by mouth 2 (two) times daily between meals.     fish oil-omega-3 fatty acids 1000  MG capsule  Take 1 g by mouth daily.     HYDROcodone-acetaminophen 5-325 MG per tablet  Commonly known as:  NORCO/VICODIN  Take 1-2 tablets by mouth every 4 (four) hours as needed for moderate pain.     hydrocortisone 10 MG tablet  Commonly known as:  CORTEF  Take 1 tablet (10 mg total) by mouth at bedtime.     hydrocortisone 5 MG tablet  Commonly known as:  CORTEF  Take 3 tablets (15 mg total) by mouth daily.     QUEtiapine 300 MG 24 hr tablet  Commonly known as:  SEROQUEL XR  Take 300 mg by mouth at bedtime.     QUEtiapine 400 MG tablet  Commonly known as:  SEROQUEL  Take 400 mg by mouth at bedtime.     risperiDONE 2 MG tablet  Commonly known as:  RISPERDAL  Take 1 mg by mouth 3 (three) times daily.     senna-docusate 8.6-50 MG per tablet  Commonly known as:  Senokot-S  Take 1 tablet by mouth at bedtime as needed for mild constipation.     traMADol 50 MG tablet  Commonly known as:  ULTRAM  Take 1 tablet (50 mg total) by mouth every 12 (twelve) hours as needed for severe pain.        No orders of the defined types were placed in this encounter.     There is no immunization history on file for this patient.  Social History  Substance Use Topics  . Smoking status: Light Tobacco Smoker -- 0.50 packs/day for 40 years    Types: Cigarettes  . Smokeless tobacco: Never Used  . Alcohol Use: No    Family history is  + HD, HTN, DM2 and pancreatic CA  Review of Systems  DATA OBTAINED: from patient, nurse GENERAL:  no fevers, fatigue, appetite changes SKIN: No itching, rash or wounds EYES: No eye pain, redness, discharge EARS: No earache, tinnitus, change in hearing NOSE: No congestion, drainage or bleeding  MOUTH/THROAT: No mouth or tooth pain, No sore throat RESPIRATORY: No cough, wheezing, SOB CARDIAC: No chest pain, palpitations, lower extremity edema  GI: No abdominal pain, No N/V/D or constipation, No heartburn or reflux  GU: No dysuria, frequency or  urgency, or incontinence  MUSCULOSKELETAL: No unrelieved bone/joint pain NEUROLOGIC: No headache, or focal weakness, some light headedness PSYCHIATRIC: No c/o anxiety or sadness   Filed Vitals:   06/19/15 2109  BP: 90/58  Pulse: 60  Temp: 97.3 F (36.3 C)  Resp: 18    SpO2  Readings from Last 1 Encounters:  06/18/15 99%        Physical Exam  GENERAL APPEARANCE: Alert, conversant,  Thin, pale No acute distress.  SKIN: No diaphoresis rash HEAD: Normocephalic, atraumatic  EYES: Conjunctiva/lids clear. Pupils round, reactive. EOMs intact.  EARS: External exam WNL, canals clear. Hearing grossly normal.  NOSE: No deformity or discharge.  MOUTH/THROAT: Lips w/o lesions  RESPIRATORY: Breathing is even, unlabored. Lung sounds are clear   CARDIOVASCULAR: Heart RRR no murmurs, rubs or gallops. No peripheral edema.   GASTROINTESTINAL: Abdomen is soft, non-tender, not distended w/ normal bowel sounds. GENITOURINARY: Bladder non tender, not distended  MUSCULOSKELETAL: No abnormal joints or musculature NEUROLOGIC:  Cranial nerves 2-12 grossly intact. Moves all extremities  PSYCHIATRIC: flat affect, no behavioral issues  Patient Active Problem List   Diagnosis Date Noted  . Chronic renal disease, stage 3, moderately decreased glomerular filtration rate (GFR) between 30-59 mL/min/1.73 square meter 06/19/2015  . Adrenal insufficiency   . Fall   . Hyponatremia 06/12/2015  . Hypotension 06/12/2015  . Orthostasis 06/11/2015  . Dizziness   . Pubic ramus fracture 06/09/2015  . Fracture of multiple pubic rami 06/09/2015  . Syncope 06/09/2015  . Chronic diarrhea 03/21/2015  . Cardiomyopathy, ischemic 01/23/2015  . 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 Orthostatic hypotension 07/03/2014  . Pre-syncope 05/21/2014  . Chronic low back pain 05/21/2014  . Dizziness and giddiness 05/03/2014  . Difficulty speaking 05/03/2014  . Numbness-Bilateral arm / Leg 05/03/2014  . Shortness of breath  05/03/2014  . Aftercare following surgery of the circulatory system, [place] 05/03/2014  . Carotid artery occlusion with infarction 04/26/2013  . Weight loss 04/23/2012  . Hot flashes 04/23/2012  . Anemia 04/23/2012  . Rhinitis 04/23/2012  . Breast cancer 04/23/2012  . Occlusion and stenosis of carotid artery without mention of cerebral infarction 01/06/2012  . Bruit 12/09/2011  . SMOKER 05/24/2009  . SWOLLEN LIMB (NOT EDEMA) 05/24/2009  . ANXIETY DEPRESSION 02/28/2009  . GRAVES DISEASE 02/17/2009  . Hyperlipidemia 02/17/2009  . Coronary atherosclerosis 02/17/2009  . GERD 02/17/2009    CBC    Component Value Date/Time   WBC 7.0 06/18/2015 0446   RBC 3.00* 06/18/2015 0446   RBC 3.01* 06/17/2015 0535   HGB 8.7* 06/18/2015 0446   HCT 25.9* 06/18/2015 0446   HCT 27.4* 06/17/2015 0532   PLT 279 06/18/2015 0446   MCV 86.3 06/18/2015 0446   LYMPHSABS 1.2 06/09/2015 1850   MONOABS 0.8 06/09/2015 1850   EOSABS 0.1 06/09/2015 1850   BASOSABS 0.0 06/09/2015 1850    CMP     Component Value Date/Time   NA 128* 06/18/2015 0446   K 4.8 06/18/2015 0446   CL 96* 06/18/2015 0446   CO2 27 06/18/2015 0446   GLUCOSE 113* 06/18/2015 0446   BUN 33* 06/18/2015 0446   CREATININE 1.25* 06/18/2015 0446   CREATININE 2.11* 05/18/2014 1429   CALCIUM 9.1 06/18/2015 0446   PROT 7.5 03/21/2015 1550   ALBUMIN 4.2 03/21/2015 1550   AST 14 03/21/2015 1550   ALT 11 03/21/2015 1550   ALKPHOS 56 03/21/2015 1550   BILITOT 0.3 03/21/2015 1550   GFRNONAA 45* 06/18/2015 0446   GFRNONAA 25* 05/18/2014 1429   GFRAA 52* 06/18/2015 0446   GFRAA 28* 05/18/2014 1429    No results found for: HGBA1C   Ct Head Wo Contrast  06/09/2015   CLINICAL DATA:  Became dizzy and fell yesterday striking head, on Plavix, history breast cancer 2002, CHF, hypertension, coronary artery disease post  MI, atrial fibrillation, carotid occlusion  EXAM: CT HEAD WITHOUT CONTRAST  TECHNIQUE: Contiguous axial images were obtained from  the base of the skull through the vertex without intravenous contrast.  COMPARISON:  05/17/2014  FINDINGS: Generalized atrophy.  Normal ventricular morphology.  No midline shift or mass effect.  Small vessel chronic ischemic changes of deep cerebral white matter.  No intracranial hemorrhage, mass lesion, or acute infarction.  Visualized paranasal sinuses and mastoid air cells clear.  Bones unremarkable.  Atherosclerotic calcifications of internal carotid and vertebral arteries at skullbase.  IMPRESSION: Atrophy with small vessel chronic ischemic changes of deep cerebral white matter.  No acute intracranial abnormalities.   Electronically Signed   By: Lavonia Dana M.D.   On: 06/09/2015 19:53   Dg Hip Unilat With Pelvis 2-3 Views Right  06/09/2015   CLINICAL DATA:  64 year old female with a history of dizziness  EXAM: DG HIP (WITH OR WITHOUT PELVIS) 2-3V RIGHT  COMPARISON:  None.  FINDINGS: Diffuse osteopenia.  Fracture of the inferior right pubic ramus.  Left hip arthroplasty.  Degenerative changes of the lumbar spine.  Vascular calcifications.  Unremarkable appearance of the proximal right femur.  IMPRESSION: Nondisplaced fracture of the right inferior pubic ramus.  Surgical changes of left hip arthroplasty.  Osteopenia.  Atherosclerosis.  Signed,  Dulcy Fanny. Earleen Newport, DO  [facility]  [facility]   Electronically Signed   By: Corrie Mckusick D.O.   On: 06/09/2015 20:58    Not all labs, radiology exams or other studies done during hospitalization come through on my EPIC note; however they are reviewed by me.    Assessment and Plan  Orthostatic hypotension Questionable etiology. Patient noted to be hypotensive during the hospitalization and had fallen secondary to dizziness and noted to be orthostatic. Patient's Avapro was discontinued. Patient with no signs or symptoms of infection and unlikely septic. UA is negative. Chest x-ray is negative. TSH was ordered which  was within normal limits at 4.311. Random plasma cortisol at 3:39 PM was 7.9. Repeat cortisol level with ACTH stim test with a baseline cortisol level of 1.4. Blood pressure improved on IV dexamethasone and IV fluids. 2-D echo with EF of 30-35% with diffuse hypokinesis grade 1 diastolic dysfunction unchanged from prior 2-D echo. Concern for possible adrenal insufficiency. Patient also noted to be hyponatremic. Repeat cortisol level was 1.8. ACTH level was not drawn as ordered. Patient was transitioned from IV dexamethasone to oral hydrocortisone and dose titrated up to hydrocortisone 15 mg every morning and 10 mg daily at bedtime.SNF plan - cont oral hydrocortisine, refer to endocrine,   Hyponatremia Maybe secondary to hypovolemic hyponatremia and probable adrenal insufficiency. Patient noted to be hyponatremic during the hospitalization. TSH within normal limits. Patient with no signs or symptoms of infection. Urinalysis is negative. Chest x-ray negative. Also status was checked and patient was noted to be orthostatic. Patient's Avapro was discontinued. Patient was hydrated with IV fluids. Patient was also initially placed on IV dexamethasone while he cosyntropin stimulation test was done. Random cortisol was 7.9 at 3:39 PM. Urine osmolality is low. Serum osmolality is low. FENA= 0.53%. a.m. cortisol level was 1.4. Repeat cortisol level was 1.8. ACTH test was not drawn as ordered. Patient was subsequently transitioned from IV dexamethasone to oral hydrocortisone and dose titrated up hydrocortisone 15 mg every morning and 10 mg daily at bedtime SNF plan - cont oral hydrocortisone, follw Na+ with serial BMP, endocrine f/u as out pt  Adrenal insufficiency ACTH  stim test with base cortisol level of 1.4. Cortisol level was checked 30 minutes and 90 minutes after cosyntropin was given. Cortisol level on 06/15/2015 morning was 1.8. ACTH level was not drawn as ordered. Patient was initially placed on IV  dexamethasone well ACTH stimulation test was done. Once this was completed patient was transitioned from IV dexamethasone to oral hydrocortisone and dose uptitrated to hydrocortisone 15 mg every morning and 10 mg daily at bedtime.SNF plan - cont hydrocortisine, follow BP, outpt refer to endocrine  Fracture of multiple pubic rami Secondary to fall. Patient was seen in consultation by Dr. Liz Beach, Dr. Lorin Mercy who had recommended conservative treatment and outpatient follow-up SNF plan - OT/PT, outpt f/u  Cardiomyopathy, ischemic Repeat echo had 8 year for 30-35% diffuse hypokinesis with grade 1 diastolic dysfunction with no significant change from prior 2-D echo.SMGF plan - cont coreg and lasix, follow BP and BMP for lytes  ANXIETY DEPRESSION SNF plan - cont klonopin and mood stablizers of seroquel and risperdol  Hyperlipidemia SNF plan - cont omega 3 FA    Mela Perham, Noah Delaine, MD

## 2015-06-19 NOTE — Assessment & Plan Note (Signed)
Repeat echo had 8 year for 30-35% diffuse hypokinesis with grade 1 diastolic dysfunction with no significant change from prior 2-D echo.SMGF plan - cont coreg and lasix, follow BP and BMP for lytes

## 2015-06-20 ENCOUNTER — Ambulatory Visit: Payer: Medicare Other

## 2015-06-21 NOTE — Telephone Encounter (Signed)
Caroline [name] at 06/15/2015 2:25 PM     Status: Signed       Expand All Collapse All   pt dtr Caroline [name] called to cancel, pt in hospital, fell again from dizzy/lightheaded, frx right pelvic bone, at [facility] hospital, pt will probably go to rehab facility (requesting [facility])  Cancelled appts for wellness visit with Caroline [name] 06/20/15 & follow up with [facility] 06/26/15

## 2015-06-21 NOTE — Telephone Encounter (Signed)
Both Annual Wellness with Durward Fortes, RN and Follow up with Elyn Aquas, PA-C cancelled until patient is discharge from rehab.

## 2015-06-26 ENCOUNTER — Ambulatory Visit: Payer: Medicare Other | Admitting: Physician Assistant

## 2015-06-28 ENCOUNTER — Encounter: Payer: Self-pay | Admitting: Internal Medicine

## 2015-06-28 ENCOUNTER — Non-Acute Institutional Stay: Payer: Medicare Other | Admitting: Internal Medicine

## 2015-06-28 ENCOUNTER — Non-Acute Institutional Stay (SKILLED_NURSING_FACILITY): Payer: Medicare Other | Admitting: Internal Medicine

## 2015-06-28 DIAGNOSIS — S32591D Other specified fracture of right pubis, subsequent encounter for fracture with routine healing: Secondary | ICD-10-CM

## 2015-06-28 DIAGNOSIS — N183 Chronic kidney disease, stage 3 unspecified: Secondary | ICD-10-CM

## 2015-06-28 DIAGNOSIS — I255 Ischemic cardiomyopathy: Secondary | ICD-10-CM | POA: Diagnosis not present

## 2015-06-28 DIAGNOSIS — S32501D Unspecified fracture of right pubis, subsequent encounter for fracture with routine healing: Secondary | ICD-10-CM | POA: Diagnosis not present

## 2015-06-28 DIAGNOSIS — I9589 Other hypotension: Secondary | ICD-10-CM

## 2015-06-28 NOTE — Progress Notes (Signed)
Patient ID: Caroline [name], female   DOB: 1951-06-10, 64 y.o.   MRN: [id number] MRN: [id number] Name: Caroline [name]  Sex: female Age: 64 y.o. DOB: 05/26/1951  [place] #: [name] Facility/Room:509 Level Of Care: SNF Provider: Wille Celeste Emergency Contacts: Extended Emergency Contact Information Primary Emergency Contact: Caroline [name] of [place] Phone: [phone] Relation: Daughter Secondary Emergency Contact: Caroline [name] of [place] Mobile Phone: [phone] Relation: Other  Code Status:   Allergies: Review of patient's allergies indicates no active allergies.  Chief Complaint  Patient presents with  . Acute Visit    HPI: Patient is 64 y.o. female with past medical history significant for coronary artery disease status post MI and stent placement, cardiomyopathy and carotid occlusion, presenting on day o  Hospital f admission, status post a fall with right hip pain that happened 1 day prior to admission. Patient became dizzy and fell. Patient has history of dizziness and being followed by Dr. Flonnie Overman from cardiology. Patient denied any preceding chest pains , palpitations or shortness of breath Pt was hospitalized from 8/6-15 for work-up and tx of orthostatic hypotension and hypotension felt to be probably chronic and both due to suspected adrenal insufficiency which will require continued tx with steroids and follow up as ou tpt with endocrinology. Pt also sustained a pubic ramus fx when she fell. Pt is admitted to SNF for generalized weakness, for continued tx of adrenal insufficiency and for OT/PT of pubic ramus fx. While as SNT pt followed for chronic cardiomyopathy with EF 30-35%, unchanged, tx with  lasix,coreg and ARB if/when hypotension improves, CAD with ASA ,tx with  plavix and coreg and hyperlipidemia with fish oil.  She has been stable during her stay here regards to anemia that has risen with a hemoglobin currently of 9.7 which is up  from 8.7 last week.  Her main concern is that she wants to go home-per nursing-physical therapy she still needs to get stronger more ambulatory-I did spend time today at bedside explaining this to her-she reluctantly agreed to stay a few more days-but would really like to leave at some point next week.  She is participating with therapy but again she does have significant weakness she does ambulate with a walker at therapy otherwise is ambulating in a wheelchair.  She says her pain is well controlled.  Regards to CHF she is not on a diaphoretic again secondary to hypotension concerns her blood pressures are low at times today 100/56 I do see readings however systolically in the 62M at times-she does not complain of increased dizziness from baseline is denying dizziness today.  She does not appear to have significant lower extremity edema her CHF appears to be stable at this point but will have to be monitored-she does not complain of shortness of breath or chest pain    Past Medical History  Diagnosis Date  . GERD (gastroesophageal reflux disease)   . Dyslipidemia   . CAD (coronary artery disease)     [facility] instent restenosis treated 10/2005  . Myocardial infarction     3 stents 1st 2004, has had several  . Heart murmur     states years ago  . Shortness of breath     with exertion  . Bright disease   . Cancer     right breast 2002  . Anxiety   . Arthritis     back  . CHF (congestive heart failure)  states she was told se had this in past by MD  . Angina   . Hypertension     states a long time ago-states she never took medication  . Depression     2002/02/26 had shock treatment due to depresson, spouse passed away 02/27/2000  . Carotid artery occlusion   . Atrial fibrillation   . Grave's disease     pt denies    Past Surgical History  Procedure Laterality Date  . Breast lumpectomy      right breast   . Coronary angioplasty with stent placement    . Joint  replacement  26-Feb-2010    Left total hip replacement  . Abdominal hysterectomy      partial  . Mouth surgery    . Endarterectomy  01/08/2012    Procedure: ENDARTERECTOMY CAROTID;  Surgeon: Mal Misty, MD;  Location: [facility];  Service: Vascular;  Laterality: Left;  with Dacron patch angioplasty  . Carotid endarterectomy  01/08/12    Left  . Fracture surgery  02/26/06    Broken Back      Medication List       This list is accurate as of: 06/28/15  8:24 PM.  Always use your most recent med list.               aspirin 325 MG tablet  Take 325 mg by mouth daily.     Black Cohosh 540 MG Caps  Take 540 mg by mouth 2 (two) times daily.     carvedilol 3.125 MG tablet  Commonly known as:  COREG  Take 1 tablet (3.125 mg total) by mouth 2 (two) times daily.     clonazepam 2 MG disintegrating tablet  Commonly known as:  KLONOPIN  Take 1-2 tablets (2-4 mg total) by mouth 2 (two) times daily. Takes 4mg  in am and 2mg  in pm     clopidogrel 75 MG tablet  Commonly known as:  PLAVIX  Take 1 tablet (75 mg total) by mouth daily.     feeding supplement (ENSURE ENLIVE) Liqd  Take 237 mLs by mouth 2 (two) times daily between meals.     fish oil-omega-3 fatty acids 1000 MG capsule  Take 1 g by mouth daily.     HYDROcodone-acetaminophen 5-325 MG per tablet  Commonly known as:  NORCO/VICODIN  Take 1-2 tablets by mouth every 4 (four) hours as needed for moderate pain.     hydrocortisone 10 MG tablet  Commonly known as:  CORTEF  Take 1 tablet (10 mg total) by mouth at bedtime.     hydrocortisone 5 MG tablet  Commonly known as:  CORTEF  Take 3 tablets (15 mg total) by mouth daily.     QUEtiapine 300 MG 24 hr tablet  Commonly known as:  SEROQUEL XR  Take 300 mg by mouth at bedtime.     QUEtiapine 400 MG tablet  Commonly known as:  SEROQUEL  Take 400 mg by mouth at bedtime.     risperiDONE 2 MG tablet  Commonly known as:  RISPERDAL  Take 1 mg by mouth 3 (three) times daily.      senna-docusate 8.6-50 MG per tablet  Commonly known as:  Senokot-S  Take 1 tablet by mouth at bedtime as needed for mild constipation.     traMADol 50 MG tablet  Commonly known as:  ULTRAM  Take 1 tablet (50 mg total) by mouth every 12 (twelve) hours as needed for severe pain.  No orders of the defined types were placed in this encounter.     There is no immunization history on file for this patient.  Social History  Substance Use Topics  . Smoking status: Light Tobacco Smoker -- 0.50 packs/day for 40 years    Types: Cigarettes  . Smokeless tobacco: Never Used  . Alcohol Use: No    Family history is  + HD, HTN, DM2 and pancreatic CA  Review of Syst DATA OBTAINED: from patient, GENERAL:  no fevers, fatigue, appetite changes SKIN: No itching, rash or wounds EYES: No eye pain, redness, discharge EARS: No earache, tinnitus, change in hearing NOSE: No congestion, drainage or bleeding  MOUTH/THROAT: No mouth or tooth pain, No sore throat RESPIRATORY: No cough, wheezing, SOB CARDIAC: No chest pain, palpitations, lower extremity edema  GI: No abdominal pain, No N/V/D or constipation, No heartburn or reflux  GU: No dysuria, frequency or urgency, or incontinence  MUSCULOSKELETAL: No unrelieved bone/joint pain NEUROLOGIC: No headache, or focal weakness, some light headedness which she describes as chronic PSYCHIATRIC: No c/o anxiety--per nursing has had crying episodes at times secondary to wanting to go home  Filed Vitals:   06/28/15 1732  BP: 100/56  Pulse: 94  Temp: 97.7 F (36.5 C)  Resp: 16    SpO2 Readings from Last 1 Encounters:  06/18/15 99%        Physical Exam  GENERAL APPEARANCE: Alert, conversant,  Thin, pale No acute distress sitting in her wheelchair.  SKIN: No diaphoresis rash HEAD: Normocephalic, atraumatic  EYES: Conjunctiva/lids clear. Pupils round, reactive. EOMs intact.  EARS: External exam WNL, canals clear. Hearing grossly normal.   NOSE: No deformity or discharge.  MOUTH/THROAT: Oropharynx clear mucous membranes moist RESPIRATORY: Breathing is even, unlabored. Lung sounds are clear   CARDIOVASCULAR: Heart RRR no murmurs, rubs or gallops. No peripheral edema.   GASTROINTESTINAL: Abdomen is soft, non-tender, not distended w/ normal bowel sounds.   MUSCULOSKELETAL: No abnormal joints or musculature--did not have pain with palpation of thehip pelvic Area NEUROLOGIC:  Cranial nerves 2-12 grossly intact. Moves all extremities  PSYCHIATRIC: flat affect, no behavioral issues-however did have a short crying episode about wanting to go home but expressed understanding that she would need to stay a few more days  Patient Active Problem List   Diagnosis Date Noted  . Chronic renal disease, stage 3, moderately decreased glomerular filtration rate (GFR) between 30-59 mL/min/1.73 square meter 06/19/2015  . Adrenal insufficiency   . Fall   . Hyponatremia 06/12/2015  . Hypotension 06/12/2015  . Orthostasis 06/11/2015  . Dizziness   . Pubic ramus fracture 06/09/2015  . Fracture of multiple pubic rami 06/09/2015  . Syncope 06/09/2015  . Chronic diarrhea 03/21/2015  . Cardiomyopathy, ischemic 01/23/2015  . 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 Orthostatic hypotension 07/03/2014  . Pre-syncope 05/21/2014  . Chronic low back pain 05/21/2014  . Dizziness and giddiness 05/03/2014  . Difficulty speaking 05/03/2014  . Numbness-Bilateral arm / Leg 05/03/2014  . Shortness of breath 05/03/2014  . Aftercare following surgery of the circulatory system, [place] 05/03/2014  . Carotid artery occlusion with infarction 04/26/2013  . Weight loss 04/23/2012  . Hot flashes 04/23/2012  . Anemia 04/23/2012  . Rhinitis 04/23/2012  . Breast cancer 04/23/2012  . Occlusion and stenosis of carotid artery without mention of cerebral infarction 01/06/2012  . Bruit 12/09/2011  . SMOKER 05/24/2009  . SWOLLEN LIMB (NOT EDEMA) 05/24/2009  . ANXIETY DEPRESSION  02/28/2009  . GRAVES DISEASE 02/17/2009  . Hyperlipidemia  02/17/2009  . Coronary atherosclerosis 02/17/2009  . GERD 02/17/2009    August right second 2016.  WBC 7.7 hemoglobin 9.7 platelets 345. [name]  BNP-229.8  CBC    Component Value Date/Time   WBC 7.0 06/18/2015 0446   RBC 3.00* 06/18/2015 0446   RBC 3.01* 06/17/2015 0535   HGB 8.7* 06/18/2015 0446   HCT 25.9* 06/18/2015 0446   HCT 27.4* 06/17/2015 0532   PLT 279 06/18/2015 0446   MCV 86.3 06/18/2015 0446   LYMPHSABS 1.2 06/09/2015 1850   MONOABS 0.8 06/09/2015 1850   EOSABS 0.1 06/09/2015 1850   BASOSABS 0.0 06/09/2015 1850    CMP     Component Value Date/Time   NA 128* 06/18/2015 0446   K 4.8 06/18/2015 0446   CL 96* 06/18/2015 0446   CO2 27 06/18/2015 0446   GLUCOSE 113* 06/18/2015 0446   BUN 33* 06/18/2015 0446   CREATININE 1.25* 06/18/2015 0446   CREATININE 2.11* 05/18/2014 1429   CALCIUM 9.1 06/18/2015 0446   PROT 7.5 03/21/2015 1550   ALBUMIN 4.2 03/21/2015 1550   AST 14 03/21/2015 1550   ALT 11 03/21/2015 1550   ALKPHOS 56 03/21/2015 1550   BILITOT 0.3 03/21/2015 1550   GFRNONAA 45* 06/18/2015 0446   GFRNONAA 25* 05/18/2014 1429   GFRAA 52* 06/18/2015 0446   GFRAA 28* 05/18/2014 1429    No results found for: HGBA1C   Ct Head Wo Contrast  06/09/2015   CLINICAL DATA:  Became dizzy and fell yesterday striking head, on Plavix, history breast cancer 2002, CHF, hypertension, coronary artery disease post MI, atrial fibrillation, carotid occlusion  EXAM: CT HEAD WITHOUT CONTRAST  TECHNIQUE: Contiguous axial images were obtained from the base of the skull through the vertex without intravenous contrast.  COMPARISON:  05/17/2014  FINDINGS: Generalized atrophy.  Normal ventricular morphology.  No midline shift or mass effect.  Small vessel chronic ischemic changes of deep cerebral white matter.  No intracranial hemorrhage, mass lesion, or acute infarction.  Visualized paranasal sinuses and mastoid air cells  clear.  Bones unremarkable.  Atherosclerotic calcifications of internal carotid and vertebral arteries at skullbase.  IMPRESSION: Atrophy with small vessel chronic ischemic changes of deep cerebral white matter.  No acute intracranial abnormalities.   Electronically Signed   By: Lavonia Dana M.D.   On: 06/09/2015 19:53   Dg Hip Unilat With Pelvis 2-3 Views Right  06/09/2015   CLINICAL DATA:  64 year old female with a history of dizziness  EXAM: DG HIP (WITH OR WITHOUT PELVIS) 2-3V RIGHT  COMPARISON:  None.  FINDINGS: Diffuse osteopenia.  Fracture of the inferior right pubic ramus.  Left hip arthroplasty.  Degenerative changes of the lumbar spine.  Vascular calcifications.  Unremarkable appearance of the proximal right femur.  IMPRESSION: Nondisplaced fracture of the right inferior pubic ramus.  Surgical changes of left hip arthroplasty.  Osteopenia.  Atherosclerosis.  Signed,  Dulcy Fanny. Earleen Newport, DO  [facility]  [facility]   Electronically Signed   By: Corrie Mckusick D.O.   On: 06/09/2015 20:58    Not all .    Assessment and Plan #1-history of pubic ramus fracture-she is cooperating with therapy apparently still has significant weakness however-she is receiving hydrocodone as needed for pain apparently this is effective.  As noted above I did spend time explaining to her that she would benefit from staying a bit longer and she appears to understand although really wants to get home-per social worker she does not really  have a support system 24 hours at home although she does have very supportive family.  At this point will monitor patient has agreed to stay several more days.  #2 cardiomyopathy-ejection fraction 30-35 percent with grade 1 diastolic dysfunction-she is on Coreg not on a diarrhetic currently secondary to hypotension concerns or ACE inhibitor-she continues to have somewhat low blood pressures although this apparently is not new-she does not  show overt signs of CHF at this point will have to be watched closely does not complain of shortness of breath chest pain does not appear to have significant edema at this time.  #3-hypernatremia-thought possibly secondary to hypovolemic hyponatremia and probable adrenal insufficiency-it appears her baseline sodium is in the high 120s will update this next week-clinically appears to be at baseline.  #4-history of adrenal insufficiency-she received IV dexamethasone in the hospital she is now on oral hydrocortisone twice a day 50 mg in the morning 10 mg at at bedtime-she will need endocrinology referral.  History of orthostatic hypotension-of questionable etiology-this appears to be at baseline again her medications in regards blood pressure been minimized she is only on Coreg currently-not a candidate for an Ace or diarrhetic at this point secondary to her low blood pressures although if she shows CHF symptoms RBC this will have to be readdressed.  #6-history of anxiety and depression she is on Klonopin twice a day as well as Seroquel and Risperdal I suspect this will be somewhat of a challenge again this is somewhat complicated with her really wanting to go home but she did express understanding of the need to stay for now.   [id number] Caven Perine C,

## 2015-07-02 ENCOUNTER — Encounter: Payer: Self-pay | Admitting: Internal Medicine

## 2015-07-02 NOTE — Progress Notes (Signed)
Patient ID: Caroline [name], female   DOB: 1951/08/29, 64 y.o.   MRN: [id number] MRN: [id number] Name: Caroline [name]  Sex: female Age: 64 y.o. DOB: Jul 14, 1951  [place] #: [name] Facility/Room:509 Level Of Care: SNF Provider: Wille Celeste Emergency Contacts: Extended Emergency Contact Information Primary Emergency Contact: [name] Phone: [phone] Relation: Daughter Secondary Emergency Contact: [name] Mobile Phone: [phone] Relation: Other  Code Status:   Allergies: Review of patient's allergies indicates no active allergies.  Chief Complaint  Patient presents with  . Acute Visit   acute visit secondary to patient wanting to go home-follow-up pubic ramus fracture-follow-up anemia-renal insufficiency-  HPI: Patient is 64 y.o. female with past medical history significant for coronary artery disease status post MI and stent placement, cardiomyopathy and carotid occlusion, presenting on day of admission, status post a fall with right hip pain that happened 1 day prior to admission. Patient became dizzy and fell. Patient has history of dizziness and being followed by Dr. Flonnie Overman from cardiology. Patient denied any preceding chest pains , palpitations or shortness of breath Pt was hospitalized from 8/6-15 for work-up and tx of orthostatic hypotension and hypotension felt to be probably chronic and both due to suspected adrenal insufficiency which will require continued tx with steroids and follow up as ou tpt with endocrinology. Pt also sustained a pubic ramus fx when she fell. Pt was admitted to SNF for generalized weakness, for continued tx of adrenal insufficiency and for OT/PT of pubic ramus fx. While as SNT pte followed for chronic cardiomyopathy with EF 30-35%, unchanged, tx with  lasix,coreg and ARB if/when hypotension improves, CAD with ASA ,tx with  plavix and coreg and hyperlipidemia with fish oil [name] states  she is working with physical therapy she wants to go home-I did discuss this fairly extensively with her and told her she still needed to get somewhat stronger she expressed understanding but still would really like to go home at some point next week.  Her other medical issues appear to be relatively stable regards to CHF she had a BNP done which was not overtly remarkable at 229.1 she does not appear to have significant lower extremity edema or signs of decompensation here.  She also has a history of anemia but hemoglobin is trending up at 9.7.  Reticulocyte counts are within normal range iron level was normal at 71 total iron-binding capacity 2 25 indicated possibly an element of chronic anemia.   regards to your pubic ramus fracture apparently pain is fairly well controlled she does receive Norco when necessary.    Past Medical History  Diagnosis Date  . GERD (gastroesophageal reflux disease)   . Dyslipidemia   . CAD (coronary artery disease)     [facility]  Diagonal instent restenosis treated 10/2005  . Myocardial infarction     3 stents 1st 2004, has had several  . Heart murmur     states years ago  . Shortness of breath     with exertion  . Bright disease   . Cancer     right breast 2002  . Anxiety   . Arthritis     back  . CHF (congestive heart failure)     states she was told se had this in past by MD  . Angina   . Hypertension     states a long time ago-states she never took medication  . Depression  03/02/02 had shock treatment due to depresson, spouse passed away Mar 02, 2000  . Carotid artery occlusion   . Atrial fibrillation   . Grave's disease     pt denies    Past Surgical History  Procedure Laterality Date  . Breast lumpectomy      right breast   . Coronary angioplasty with stent placement    . Joint replacement  02-Mar-2010    Left total hip replacement  . Abdominal hysterectomy      partial  . Mouth surgery    . Endarterectomy  01/08/2012    Procedure:  ENDARTERECTOMY CAROTID;  Surgeon: Mal Misty, MD;  Location: [facility];  Service: Vascular;  Laterality: Left;  with Dacron patch angioplasty  . Carotid endarterectomy  01/08/12    Left  . Fracture surgery  Mar 02, 2006    Broken Back      Medication List       This list is accurate as of: 06/28/15 11:59 PM.  Always use your most recent med list.               aspirin 325 MG tablet  Take 325 mg by mouth daily.     Black Cohosh 540 MG Caps  Take 540 mg by mouth 2 (two) times daily.     carvedilol 3.125 MG tablet  Commonly known as:  COREG  Take 1 tablet (3.125 mg total) by mouth 2 (two) times daily.     clonazepam 2 MG disintegrating tablet  Commonly known as:  KLONOPIN  Take 1-2 tablets (2-4 mg total) by mouth 2 (two) times daily. Takes 4mg  in am and 2mg  in pm     clopidogrel 75 MG tablet  Commonly known as:  PLAVIX  Take 1 tablet (75 mg total) by mouth daily.     feeding supplement (ENSURE ENLIVE) Liqd  Take 237 mLs by mouth 2 (two) times daily between meals.     fish oil-omega-3 fatty acids 1000 MG capsule  Take 1 g by mouth daily.     HYDROcodone-acetaminophen 5-325 MG per tablet  Commonly known as:  NORCO/VICODIN  Take 1-2 tablets by mouth every 4 (four) hours as needed for moderate pain.     hydrocortisone 10 MG tablet  Commonly known as:  CORTEF  Take 1 tablet (10 mg total) by mouth at bedtime.     hydrocortisone 5 MG tablet  Commonly known as:  CORTEF  Take 3 tablets (15 mg total) by mouth daily.     QUEtiapine 300 MG 24 hr tablet  Commonly known as:  SEROQUEL XR  Take 300 mg by mouth at bedtime.     QUEtiapine 400 MG tablet  Commonly known as:  SEROQUEL  Take 400 mg by mouth at bedtime.     risperiDONE 2 MG tablet  Commonly known as:  RISPERDAL  Take 1 mg by mouth 3 (three) times daily.     senna-docusate 8.6-50 MG per tablet  Commonly known as:  Senokot-S  Take 1 tablet by mouth at bedtime as needed for mild constipation.     traMADol 50 MG tablet   Commonly known as:  ULTRAM  Take 1 tablet (50 mg total) by mouth every 12 (twelve) hours as needed for severe pain.        No orders of the defined types were placed in this encounter.     There is no immunization history on file for this patient.  Social History  Substance Use Topics  . Smoking status:  Light Tobacco Smoker -- 0.50 packs/day for 40 years    Types: Cigarettes  . Smokeless tobacco: Never Used  . Alcohol Use: No    Family history is  + HD, HTN, DM2 and pancreatic CA  Review of Systems  DATA OBTAINED: from patient, nurse GENERAL:  no fevers, fatigue, appetite changes SKIN: No itching, rash or wounds EYES: No eye pain, redness, discharge EARS: No earache, tinnitus, change in hearing NOSE: No congestion, drainage or bleeding  MOUTH/THROAT: No mouth or tooth pain, No sore throat RESPIRATORY: No cough, wheezing, SOB CARDIAC: No chest pain, palpitations, lower extremity edema  GI: No abdominal pain, No N/V/D or constipation, No heartburn or reflux  GU: No dysuria, frequency or urgency, or incontinence  MUSCULOSKELETAL: No unrelieved bone/joint pain NEUROLOGIC: No headache, or focal weakness, some light headedness PSYCHIATRIC: appears somewhat anxious secondary to wanting to go home badly-   Filed Vitals:   07/02/15 0931  BP: 100/56  Pulse: 98  Temp: 97.7 F (36.5 C)  Resp: 16    SpO2 Readings from Last 1 Encounters:  06/18/15 99%        Physical Exam  GENERAL APPEARANCE: Alert, conversant,  Thin, pale No acute distress.  SKIN: No diaphoresis rash HEAD: Normocephalic, atraumatic  EYES: Conjunctiva/lids clear. Pupils round, reactive. EOMs intact.  EARS: External exam WNL, canals clear. Hearing grossly normal.  NOSE: No deformity or discharge.  MOUTH/THROAT: pharynx clear mucous membranes moist   RESPIRATORY: Breathing is even, unlabored. Lung sounds are clear   CARDIOVASCULAR: Heart RRR no murmurs, rubs or gallops. No peripheral edema.    GASTROINTESTINAL: Abdomen is soft, non-tender, not distended w/ normal bowel sounds.  MUSCULOSKELETAL: No abnormal joints or musculature--appears frail-there is no tenderness to palpation of her pelvic area or deformity noted  NEUROLOGIC:  Cranial nerves 2-12 grossly intact. Moves all extremities  PSYCHIATRIC: flat affect, no behavioral issues--she was somewhat teary-eyed though however when discussing her desire to go home   Patient Active Problem List   Diagnosis Date Noted  . Chronic renal disease, stage 3, moderately decreased glomerular filtration rate (GFR) between 30-59 mL/min/1.73 square meter 06/19/2015  . Adrenal insufficiency   . Fall   . Hyponatremia 06/12/2015  . Hypotension 06/12/2015  . Orthostasis 06/11/2015  . Dizziness   . Pubic ramus fracture 06/09/2015  . Fracture of multiple pubic rami 06/09/2015  . Syncope 06/09/2015  . Chronic diarrhea 03/21/2015  . Cardiomyopathy, ischemic 01/23/2015  . 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 Orthostatic hypotension 07/03/2014  . Pre-syncope 05/21/2014  . Chronic low back pain 05/21/2014  . Dizziness and giddiness 05/03/2014  . Difficulty speaking 05/03/2014  . Numbness-Bilateral arm / Leg 05/03/2014  . Shortness of breath 05/03/2014  . Aftercare following surgery of the circulatory system, [place] 05/03/2014  . Carotid artery occlusion with infarction 04/26/2013  . Weight loss 04/23/2012  . Hot flashes 04/23/2012  . Anemia 04/23/2012  . Rhinitis 04/23/2012  . Breast cancer 04/23/2012  . Occlusion and stenosis of carotid artery without mention of cerebral infarction 01/06/2012  . Bruit 12/09/2011  . SMOKER 05/24/2009  . SWOLLEN LIMB (NOT EDEMA) 05/24/2009  . ANXIETY DEPRESSION 02/28/2009  . GRAVES DISEASE 02/17/2009  . Hyperlipidemia 02/17/2009  . Coronary atherosclerosis 02/17/2009  . GERD 02/17/2009    Labs.  06/25/2015.  WBC 7.7 hemoglobin 9.7 platelets 345.  06/18/2015.  BNP 229.1.  06/17/2015.  Reticulocytes  1.9.  Ferritin 43-iron 71-TIBC 225-B12 and folate within normal limits  CBC    Component Value Date/Time  WBC 7.0 06/18/2015 0446   RBC 3.00* 06/18/2015 0446   RBC 3.01* 06/17/2015 0535   HGB 8.7* 06/18/2015 0446   HCT 25.9* 06/18/2015 0446   HCT 27.4* 06/17/2015 0532   PLT 279 06/18/2015 0446   MCV 86.3 06/18/2015 0446   LYMPHSABS 1.2 06/09/2015 1850   MONOABS 0.8 06/09/2015 1850   EOSABS 0.1 06/09/2015 1850   BASOSABS 0.0 06/09/2015 1850    CMP     Component Value Date/Time   NA 128* 06/18/2015 0446   K 4.8 06/18/2015 0446   CL 96* 06/18/2015 0446   CO2 27 06/18/2015 0446   GLUCOSE 113* 06/18/2015 0446   BUN 33* 06/18/2015 0446   CREATININE 1.25* 06/18/2015 0446   CREATININE 2.11* 05/18/2014 1429   CALCIUM 9.1 06/18/2015 0446   PROT 7.5 03/21/2015 1550   ALBUMIN 4.2 03/21/2015 1550   AST 14 03/21/2015 1550   ALT 11 03/21/2015 1550   ALKPHOS 56 03/21/2015 1550   BILITOT 0.3 03/21/2015 1550   GFRNONAA 45* 06/18/2015 0446   GFRNONAA 25* 05/18/2014 1429   GFRAA 52* 06/18/2015 0446   GFRAA 28* 05/18/2014 1429    No results found for: HGBA1C   Ct Head Wo Contrast  06/09/2015   CLINICAL DATA:  Became dizzy and fell yesterday striking head, on Plavix, history breast cancer 2002, CHF, hypertension, coronary artery disease post MI, atrial fibrillation, carotid occlusion  EXAM: CT HEAD WITHOUT CONTRAST  TECHNIQUE: Contiguous axial images were obtained from the base of the skull through the vertex without intravenous contrast.  COMPARISON:  05/17/2014  FINDINGS: Generalized atrophy.  Normal ventricular morphology.  No midline shift or mass effect.  Small vessel chronic ischemic changes of deep cerebral white matter.  No intracranial hemorrhage, mass lesion, or acute infarction.  Visualized paranasal sinuses and mastoid air cells clear.  Bones unremarkable.  Atherosclerotic calcifications of internal carotid and vertebral arteries at skullbase.  IMPRESSION: Atrophy with small  vessel chronic ischemic changes of deep cerebral white matter.  No acute intracranial abnormalities.   Electronically Signed   By: Lavonia Dana M.D.   On: 06/09/2015 19:53   Dg Hip Unilat With Pelvis 2-3 Views Right  06/09/2015   CLINICAL DATA:  64 year old female with a history of dizziness  EXAM: DG HIP (WITH OR WITHOUT PELVIS) 2-3V RIGHT  COMPARISON:  None.  FINDINGS: Diffuse osteopenia.  Fracture of the inferior right pubic ramus.  Left hip arthroplasty.  Degenerative changes of the lumbar spine.  Vascular calcifications.  Unremarkable appearance of the proximal right femur.  IMPRESSION: Nondisplaced fracture of the right inferior pubic ramus.  Surgical changes of left hip arthroplasty.  Osteopenia.  Atherosclerosis.  Signed,  Dulcy Fanny. Earleen Newport, DO  [facility]  [facility]   Electronically Signed   By: Corrie Mckusick D.O.   On: 06/09/2015 20:58    Not all labs, radiology exams or other studies done during hospitalization come through on my EPIC note; however they are reviewed by me.    Assessment and Plan  history of pubic ramus fracture-this was secondary to fall at this point continue current pain management physical therapy patient really wants to go home-but this will depend on her progress in physical therapy-she is hoping to go home next week this will have to be evaluated certainly by physical therapy about her appropriateness I did discuss this with her and she expressed understanding although really wants to go home.  #2 history of CHF-this appears clinically stable BNP at  229 was not overtly remarkable she does not show signs of volume overload shortness of breath or chest pain.--She continues on a beta blocker  #3 anxiety depression she continue Klonopin and mood stabilizers including Seroquel and Risperdal-she is somewhat teary-eyed during our talk today secondary to wanting to go home but was pleasant and appropriate.  #4 anemia hemoglobin  is rising we will update this next week -   #5 renal insufficiency-creatinine of 1.25 on August 15 shows stability this will need updating next week as well.  #6-hyponatremia thought possibly secondary to hypovolemic hyponatremia probable adrenal insufficiency.  She is currently on oral hydrocortisone-recent sodium 128 this will need updating next week.  #7-history of orthostatic hypotension-blood pressures appear to be somewhat variable appears to run largely in the 23R- low 007M systolically she denies orthostatic symptoms currently there was concern for possible adrenal insufficiency again this is being addressed with the hydrocortisone.  [id number]     Cobie Leidner C,

## 2015-07-02 NOTE — Progress Notes (Signed)
This encounter was created in error - please disregard.

## 2015-07-04 ENCOUNTER — Encounter: Payer: Self-pay | Admitting: Internal Medicine

## 2015-07-04 ENCOUNTER — Non-Acute Institutional Stay (SKILLED_NURSING_FACILITY): Payer: Medicare Other | Admitting: Internal Medicine

## 2015-07-04 DIAGNOSIS — F341 Dysthymic disorder: Secondary | ICD-10-CM

## 2015-07-04 DIAGNOSIS — E2740 Unspecified adrenocortical insufficiency: Secondary | ICD-10-CM

## 2015-07-04 DIAGNOSIS — N183 Chronic kidney disease, stage 3 unspecified: Secondary | ICD-10-CM

## 2015-07-04 DIAGNOSIS — E785 Hyperlipidemia, unspecified: Secondary | ICD-10-CM | POA: Diagnosis not present

## 2015-07-04 DIAGNOSIS — S32501D Unspecified fracture of right pubis, subsequent encounter for fracture with routine healing: Secondary | ICD-10-CM | POA: Diagnosis not present

## 2015-07-04 DIAGNOSIS — R55 Syncope and collapse: Secondary | ICD-10-CM | POA: Diagnosis not present

## 2015-07-04 DIAGNOSIS — S32591D Other specified fracture of right pubis, subsequent encounter for fracture with routine healing: Secondary | ICD-10-CM

## 2015-07-04 DIAGNOSIS — E871 Hypo-osmolality and hyponatremia: Secondary | ICD-10-CM

## 2015-07-04 DIAGNOSIS — I255 Ischemic cardiomyopathy: Secondary | ICD-10-CM

## 2015-07-04 DIAGNOSIS — I951 Orthostatic hypotension: Secondary | ICD-10-CM | POA: Diagnosis not present

## 2015-07-04 NOTE — Progress Notes (Signed)
MRN: [id number] Name: Caroline [name]  Sex: female Age: 64 y.o. DOB: Apr 14, 1951  [place] [facility]: [name] Facility/Room:509 Level Of Care: SNF Provider: Inocencio Homes D Emergency Contacts: Extended Emergency Contact Information Primary Emergency Contact: [place] of [place] Phone: [phone] Relation: Daughter Secondary Emergency Contact: [place] of [place] Mobile Phone: [phone] Relation: Other  Code Status: DNR  Allergies: Review of patient's allergies indicates no active allergies.  Chief Complaint  Patient presents with  . Discharge Note    HPI: Patient is 64 y.o. female with past medical history significant for coronary artery disease status post MI and stent placement, cardiomyopathy and carotid occlusion,  Who was admitted to SNF s/p hospitalization for a fall with pubic ramus fx and for presumed adrenal insufficiency with hypotension. Pt is stable for d/c to home. Pt's stay remarkable only for SBP running 90-100's but without symptoms.  Past Medical History  Diagnosis Date  . GERD (gastroesophageal reflux disease)   . Dyslipidemia   . CAD (coronary artery disease)     [facility]  Diagonal instent restenosis treated 10/2005  . Myocardial infarction     3 stents 1st 2004, has had several  . Heart murmur     states years ago  . Shortness of breath     with exertion  . Bright disease   . Cancer     right breast 2001/02/12  . Anxiety   . Arthritis     back  . CHF (congestive heart failure)     states she was told se had this in past by MD  . Angina   . Hypertension     states a long time ago-states she never took medication  . Depression     02/12/2002 had shock treatment due to depresson, spouse passed away 02-13-2000  . Carotid artery occlusion   . Atrial fibrillation   . Grave's disease     pt denies    Past Surgical History  Procedure Laterality Date  . Breast lumpectomy      right breast   . Coronary angioplasty with  stent placement    . Joint replacement  2010/02/12    Left total hip replacement  . Abdominal hysterectomy      partial  . Mouth surgery    . Endarterectomy  01/08/2012    Procedure: ENDARTERECTOMY CAROTID;  Surgeon: Mal Misty, MD;  Location[facility];  Service: Vascular;  Laterality: Left;  with Dacron patch angioplasty  . Carotid endarterectomy  01/08/12    Left  . Fracture surgery  02-12-06    Broken Back      Medication List       This list is accurate as of: 07/04/15  2:01 PM.  Always use your most recent med list.               aspirin 325 MG tablet  Take 325 mg by mouth daily.     Black Cohosh 540 MG Caps  Take 540 mg by mouth 2 (two) times daily.     carvedilol 3.125 MG tablet  Commonly known as:  COREG  Take 1 tablet (3.125 mg total) by mouth 2 (two) times daily.     clonazepam 2 MG disintegrating tablet  Commonly known as:  KLONOPIN  Take 1-2 tablets (2-4 mg total) by mouth 2 (two) times daily. Takes 4mg  in am and 2mg  in pm     clopidogrel 75 MG tablet  Commonly known as:  PLAVIX  Take 1 tablet (75 mg total) by mouth daily.     feeding supplement (ENSURE ENLIVE) Liqd  Take 237 mLs by mouth 2 (two) times daily between meals.     fish oil-omega-3 fatty acids 1000 MG capsule  Take 1 g by mouth daily.     HYDROcodone-acetaminophen 5-325 MG per tablet  Commonly known as:  NORCO/VICODIN  Take 1-2 tablets by mouth every 4 (four) hours as needed for moderate pain.     hydrocortisone 10 MG tablet  Commonly known as:  CORTEF  Take 1 tablet (10 mg total) by mouth at bedtime.     hydrocortisone 5 MG tablet  Commonly known as:  CORTEF  Take 3 tablets (15 mg total) by mouth daily.     QUEtiapine 300 MG 24 hr tablet  Commonly known as:  SEROQUEL XR  Take 300 mg by mouth at bedtime.     QUEtiapine 400 MG tablet  Commonly known as:  SEROQUEL  Take 400 mg by mouth at bedtime.     risperiDONE 2 MG tablet  Commonly known as:  RISPERDAL  Take 1 mg by mouth 3 (three)  times daily.     senna-docusate 8.6-50 MG per tablet  Commonly known as:  Senokot-S  Take 1 tablet by mouth at bedtime as needed for mild constipation.     traMADol 50 MG tablet  Commonly known as:  ULTRAM  Take 1 tablet (50 mg total) by mouth every 12 (twelve) hours as needed for severe pain.        No orders of the defined types were placed in this encounter.     There is no immunization history on file for this patient.  Social History  Substance Use Topics  . Smoking status: Light Tobacco Smoker -- 0.50 packs/day for 40 years    Types: Cigarettes  . Smokeless tobacco: Never Used  . Alcohol Use: No    Filed Vitals:   07/04/15 1346  BP: 107/69  Pulse: 105  Temp: 97 F (36.1 C)  Resp: 20    Physical Exam  GENERAL APPEARANCE: Alert, conversant. No acute distress.  HEENT: Unremarkable. RESPIRATORY: Breathing is even, unlabored. Lung sounds are clear   CARDIOVASCULAR: Heart RRR no murmurs, rubs or gallops. No peripheral edema.  GASTROINTESTINAL: Abdomen is soft, non-tender, not distended w/ normal bowel sounds.  NEUROLOGIC: Cranial nerves 2-12 grossly intact. Moves all extremities  Patient Active Problem List   Diagnosis Date Noted  . Chronic renal disease, stage 3, moderately decreased glomerular filtration rate (GFR) between 30-59 mL/min/1.73 square meter 06/19/2015  . Adrenal insufficiency   . Fall   . Hyponatremia 06/12/2015  . Hypotension 06/12/2015  . Orthostasis 06/11/2015  . Dizziness   . Pubic ramus fracture 06/09/2015  . Fracture of multiple pubic rami 06/09/2015  . Syncope 06/09/2015  . Chronic diarrhea 03/21/2015  . Cardiomyopathy, ischemic 01/23/2015  . 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 Orthostatic hypotension 07/03/2014  . Pre-syncope 05/21/2014  . Chronic low back pain 05/21/2014  . Dizziness and giddiness 05/03/2014  . Difficulty speaking 05/03/2014  . Numbness-Bilateral arm / Leg 05/03/2014  . Shortness of breath 05/03/2014  . Aftercare  following surgery of the circulatory system, [place] 05/03/2014  . Carotid artery occlusion with infarction 04/26/2013  . Weight loss 04/23/2012  . Hot flashes 04/23/2012  . Anemia 04/23/2012  . Rhinitis 04/23/2012  . Breast cancer 04/23/2012  . Occlusion and stenosis of carotid artery without mention of cerebral infarction 01/06/2012  . Bruit 12/09/2011  .  SMOKER 05/24/2009  . SWOLLEN LIMB (NOT EDEMA) 05/24/2009  . ANXIETY DEPRESSION 02/28/2009  . GRAVES DISEASE 02/17/2009  . Hyperlipidemia 02/17/2009  . Coronary atherosclerosis 02/17/2009  . GERD 02/17/2009    CBC    Component Value Date/Time   WBC 7.0 06/18/2015 0446   RBC 3.00* 06/18/2015 0446   RBC 3.01* 06/17/2015 0535   HGB 8.7* 06/18/2015 0446   HCT 25.9* 06/18/2015 0446   HCT 27.4* 06/17/2015 0532   PLT 279 06/18/2015 0446   MCV 86.3 06/18/2015 0446   LYMPHSABS 1.2 06/09/2015 1850   MONOABS 0.8 06/09/2015 1850   EOSABS 0.1 06/09/2015 1850   BASOSABS 0.0 06/09/2015 1850    CMP     Component Value Date/Time   NA 128* 06/18/2015 0446   K 4.8 06/18/2015 0446   CL 96* 06/18/2015 0446   CO2 27 06/18/2015 0446   GLUCOSE 113* 06/18/2015 0446   BUN 33* 06/18/2015 0446   CREATININE 1.25* 06/18/2015 0446   CREATININE 2.11* 05/18/2014 1429   CALCIUM 9.1 06/18/2015 0446   PROT 7.5 03/21/2015 1550   ALBUMIN 4.2 03/21/2015 1550   AST 14 03/21/2015 1550   ALT 11 03/21/2015 1550   ALKPHOS 56 03/21/2015 1550   BILITOT 0.3 03/21/2015 1550   GFRNONAA 45* 06/18/2015 0446   GFRNONAA 25* 05/18/2014 1429   GFRAA 52* 06/18/2015 0446   GFRAA 28* 05/18/2014 1429    Assessment and Plan  Pt is stable for de/c to home with HH/OT/PT/nursing. Pt's prescriptions have been written. Pt will need follow -up with Endocrinology for adrenal insufficiency.   Time spent > 35 min > 50% of time with patient was spent reviewing records, labs, tests and studies, counseling and developing plan of care  Hennie Duos, MD

## 2015-07-07 DIAGNOSIS — I129 Hypertensive chronic kidney disease with stage 1 through stage 4 chronic kidney disease, or unspecified chronic kidney disease: Secondary | ICD-10-CM | POA: Diagnosis not present

## 2015-07-07 DIAGNOSIS — I25119 Atherosclerotic heart disease of native coronary artery with unspecified angina pectoris: Secondary | ICD-10-CM | POA: Diagnosis not present

## 2015-07-07 DIAGNOSIS — I509 Heart failure, unspecified: Secondary | ICD-10-CM | POA: Diagnosis not present

## 2015-07-07 DIAGNOSIS — I951 Orthostatic hypotension: Secondary | ICD-10-CM | POA: Diagnosis not present

## 2015-07-07 DIAGNOSIS — M6281 Muscle weakness (generalized): Secondary | ICD-10-CM | POA: Diagnosis not present

## 2015-07-07 DIAGNOSIS — S32591D Other specified fracture of right pubis, subsequent encounter for fracture with routine healing: Secondary | ICD-10-CM | POA: Diagnosis not present

## 2015-07-10 ENCOUNTER — Telehealth: Payer: Self-pay | Admitting: Physician Assistant

## 2015-07-10 NOTE — Telephone Encounter (Signed)
Spoke with Nicki Reaper at [facility]; gave Vo per provider instructions/SLS  Please call patient and schedule F/U office visit per provider instructions/SLS Thanks.

## 2015-07-10 NOTE — Telephone Encounter (Signed)
Caller name: Nicki Reaper with [facility] Relationship to patient: Can be reached: [phone] Pharmacy:  Reason for call: Continuing home health 1x week for 3 weeks. Please call with VO. Ok to leave VO on answering machine.

## 2015-07-11 ENCOUNTER — Telehealth: Payer: Self-pay | Admitting: Physician Assistant

## 2015-07-11 NOTE — Telephone Encounter (Signed)
She needs to be seen this week if possible.  (Concern for worsening orthostatic hypotension and what might be causing it). Make sure she is taking medications as directed. I have not seen her in quite some time and she has been hospitalized since the last time she was seen in office. She needs to stay hydrated and increase salt intake (Gatorade) to help keep BP stable.

## 2015-07-11 NOTE — Telephone Encounter (Signed)
FYI: patient has appt on Monday, 07/16/15 at 11:30a

## 2015-07-11 NOTE — Telephone Encounter (Signed)
Caller name: Ebony Hail with [facility] (PT) Relationship to patient: Can be reached: [phone] Pharmacy:  Reason for call: Reporting BP on pt Supine 102/60 After walking 10-20ft slowing BP dropped to 84/58 After resting 5 minutes went up to 98/51 After standing 1 minute went down to 76/47 After resting 10 minutes went up to 108/63  Pt is now stable. Pt was dizzy but declined Ebony Hail contacting 911.

## 2015-07-11 NOTE — Telephone Encounter (Signed)
Caller name: Arville Go with [facility] Relationship to patient: Can be reached: [phone] Pharmacy:  Reason for call: Continue physical therapy 3x week for 4 weeks, 2x week for 4 weeks, 1xweek for 1 week. Please call her with verbal order. Please call prior to [facility]. Pt is restrictive on times that she will allow them in the home.

## 2015-07-11 NOTE — Telephone Encounter (Signed)
I spoke with the patient about this and she refused to schedule appt for tomorrow or Friday d/t "not having any transportation", I implored her to make some phone calls this evening to try & find transportation to get in to office and be seen this week and call as early as possible tomorrow morning to get appointment. Patient states that she will follow request, but kept repeating that she "would just have to keep her appt scheduled for Monday"; I reiterated the need for her to be seen earlier and also protocol symptoms for calling 911/EMS for ED care, if needed, as patient sounds weak and SOB via phone conversation/SLS

## 2015-07-11 NOTE — Telephone Encounter (Signed)
VO per provider, phoned in to [place] with [facility] for continued PT/SLS

## 2015-07-11 NOTE — Telephone Encounter (Signed)
Unfortunately we have done our part and will have to hope she follows through.

## 2015-07-11 NOTE — Telephone Encounter (Signed)
Ok to give verbal order Ivin Booty.

## 2015-07-16 ENCOUNTER — Ambulatory Visit: Payer: Medicare Other | Admitting: Physician Assistant

## 2015-07-16 ENCOUNTER — Telehealth: Payer: Self-pay | Admitting: Physician Assistant

## 2015-07-18 ENCOUNTER — Telehealth: Payer: Self-pay | Admitting: Physician Assistant

## 2015-07-18 NOTE — Telephone Encounter (Signed)
Caroline [name] with [facility] Ph# [phone]  Pt has history of HTN Pt has orthostatic issues Pt had fall and broke pelvis 06/09/15 Went to SNF from hospital Pt having low BP again.  Today BP readings: sitting at rest 90/58, upon standing 67/45, after standing for 2 minutes 81/51 (pt dizzy), after sitting for 1 minute 83/60 Pt is checking herself at home 1/day while sitting, yesterday 07/17/15 90/54 taken by pt.

## 2015-07-18 NOTE — Telephone Encounter (Signed)
Yes

## 2015-07-18 NOTE — Telephone Encounter (Signed)
Per call from home health nurse pt thought she was due for follow up (see other tel note regarding low BP), called pt regarding appt she cancelled for 07/16/15, pt states that she was not able to walk and had to cancel, pt states her daughter will have to bring her and cannot come until 2 or 3 pm. Could I schedule pt in a 2:30pm cpe/new pt spot for her hospital f/u?

## 2015-07-19 ENCOUNTER — Telehealth: Payer: Self-pay | Admitting: Physician Assistant

## 2015-07-19 NOTE — Telephone Encounter (Signed)
Is she still taking her Cortef tablets as directed for orthostatic hypotension? Is she wearing compression stockings? I have not seen patient since May and she canceled her Hospital follow-up with me scheduled for 07/16/15. She needs follow-up ASAP with both our office and Cardiology. She needs to stay hydrated and increase salt intake. If she is not taking the Cortef we may need to refill medication or select a similar medication. Again she needs to be seen ASAP, today if possible before this worsens. If symptoms significant, she needs to call 911 for evaluation. This is why it is very important for her to follow-up as directed by Hospitalist MD.

## 2015-07-19 NOTE — Telephone Encounter (Signed)
Scheduled 07/30/15 2:30pm

## 2015-07-19 NOTE — Telephone Encounter (Signed)
Caller name: Nicki Reaper from [facility] [facility]  Relation to pt: RN Call back number: [phone]   Reason for call:  RN requesting verbal orders 1 month 1 due to patient having low BP

## 2015-07-19 NOTE — Telephone Encounter (Addendum)
Juluis Rainier- Patient [facility] hospital follow up to 07/31/2015 at 2:30 PM due to daughter not being able to transport her to appointment.

## 2015-07-20 NOTE — Telephone Encounter (Signed)
Called patient and left message on home voicemail to return call.

## 2015-07-20 NOTE — Telephone Encounter (Signed)
Please call and clarify as message taken by front desk is not detailed.

## 2015-07-20 NOTE — Telephone Encounter (Signed)
Ivin Booty spoke with Nicki Reaper from [place] and notified him to please urge patient to follow up in office.

## 2015-07-20 NOTE — Telephone Encounter (Signed)
Caroline [name], with [facility] informed VO given for once weekly x4 weeks & also for Education officer, museum per provider, understood & agreed. Also, asked caller to please encourage pt to keep scheduled 09.27.16 appointment with PCP [facility] Cardiology next week], Nicki Reaper said he would do so, as he feels she is being non-compliant on these matters d/t depending on daughter for transportation only/SLS

## 2015-07-23 ENCOUNTER — Encounter: Payer: Self-pay | Admitting: *Deleted

## 2015-07-26 ENCOUNTER — Encounter: Payer: Self-pay | Admitting: Cardiovascular Disease

## 2015-07-26 ENCOUNTER — Ambulatory Visit (INDEPENDENT_AMBULATORY_CARE_PROVIDER_SITE_OTHER): Payer: Medicare Other | Admitting: Cardiovascular Disease

## 2015-07-26 VITALS — BP 80/64 | HR 67 | Ht 63.0 in | Wt 109.8 lb

## 2015-07-26 DIAGNOSIS — R4781 Slurred speech: Secondary | ICD-10-CM | POA: Diagnosis not present

## 2015-07-26 DIAGNOSIS — E271 Primary adrenocortical insufficiency: Secondary | ICD-10-CM | POA: Diagnosis not present

## 2015-07-26 DIAGNOSIS — R4589 Other symptoms and signs involving emotional state: Secondary | ICD-10-CM

## 2015-07-26 NOTE — Patient Instructions (Addendum)
Medication Instructions:  Your physician recommends that you continue on your current medications as directed. Please refer to the Current Medication list given to you today.----Call Janan Halter, RN tomorrow to let me know dose of Hydrocortisone   Lab work: None ordered  Testing/Procedures: You have been referred to [facility] Neurology--? Neuromuscular disorder   You have been referred to [facility] Endocrinology--? Addison's Disease   Follow-Up: Your physician wants you to follow-up in: 6 months with Dr Blima Singer will receive a reminder letter in the mail two months in advance. If you don't receive a letter, please call our office to schedule the follow-up appointment.   Any Other Special Instructions Will Be Listed Below (If Applicable).  Patient needs to be off Seroquel and Risperdal--Dr Johnsie Cancel will send note to MD

## 2015-07-26 NOTE — Progress Notes (Signed)
Patient ID: Caroline [name], female   DOB: Aug 11, 1951, 64 y.o.   MRN: [id number] Caroline [name] is a 64 y.o. 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 Hospitalized in August 2016 with dizzyness Postural cortisol low and started on steroids.  ACE/Beta blocker d/c.  Pubic rami fracture  Studies:  - LHC (6/10): Mid Diag 90% ISR, prox RCA 20%, mid RCA 40%, EF 35% global HK with apical AK >>> PCI: Cutting balloon POBA of Diag ISR  - Echo (5/10): EF 40%  - Echo (06/11/15) ): EF 30-35%, AS dyskinesis, inf HK, mild AI, mild MR  - Nuclear (10/14): Lg anterior, apical and inf-apical infarct, no ischemia, EF 31% - similar to study from 2010; Intermediate Risk >>> Med Rx  - Carotid US (7/16): RICA occluded, L CEA patent   Recent Labs/Images:  05/10/2014: ALT <8; Hemoglobin 11.5*; TSH 3.689  05/18/2014: Creatinine 2.11*; Potassium 4.4  Ct Head Wo Contrast  05/17/2014 CLINICAL DATA: Recurrent dizziness for 2 months, several false EXAM: CT HEAD WITHOUT CONTRAST TECHNIQUE: Contiguous axial images were obtained from the base of the skull through the vertex without intravenous contrast. COMPARISON: CT brain scan of 04/17/2012 FINDINGS: The ventricular system remains prominent as are the cortical sulci and cerebellar folia, consistent with diffuse atrophy. The septum remains in a midline position. Moderate small vessel ischemic change is again noted throughout the periventricular white matter. No hemorrhage, mass lesion, or acute infarction is seen. On bone window images, no calvarial abnormality is seen. The paranasal sinuses that are visualized are clear. IMPRESSION: Atrophy and moderate small vessel ischemic change. No acute intracranial abnormality. Electronically Signed By: Ivar Drape M.D. On: 05/17/2014 13:20   Reviewed event monitor from 8/12 to 07/13/14   No arrythmia NSR occasional PVC  No correlation of dizzyness with rhythm   Carotid 05/29/15  RICA chronic occlusion left ICA no stenosis   Not clear that she has been taking her cortef.  She has worsening hypotension, postural symptoms and dizzyness.  She sees Dr Clovis Pu for psychiatry and needs to come off her  seroquel and risperdal if possible Also contributing to low sodium  ROS: Denies fever, malais, weight loss, blurry vision, decreased visual acuity, cough, sputum, SOB, hemoptysis, pleuritic pain, palpitaitons, heartburn, abdominal pain, melena, lower extremity edema, claudication, or rash.  All other systems reviewed and negative  General:  Not orthostatic but BP only 40-98  Systolic sitting and stading  Flat affect  Chronically ill frail female  HEENT: normal Neck supple with no adenopathy JVP normal Left CEA scar left bruits no thyromegaly Lungs clear with no wheezing and good diaphragmatic motion Heart:  S1/S2 no murmur, no rub, gallop or click PMI normal Abdomen: benighn, BS positve, no tenderness, no AAA no bruit.  No HSM or HJR Distal pulses intact with no bruits No edema Neuro non-focal Skin warm and dry Generalized weakness BP 90/ palp sitting and  70/palp standing    Current Outpatient Prescriptions  Medication Sig Dispense Refill  . aspirin 325 MG tablet Take 325 mg by mouth daily.    . Black Cohosh 540 MG CAPS Take 540 mg by mouth 2 (two) times daily.    . clonazePAM (KLONOPIN) 1 MG tablet Take 2 mg by mouth 2 (two) times daily.     . clopidogrel (PLAVIX) 75 MG tablet  Take 1 tablet (75 mg total) by mouth daily. 30 tablet 11  . HYDROcodone-acetaminophen (NORCO/VICODIN) 5-325 MG per tablet Take 1-2 tablets by mouth every 4 (four) hours as needed for moderate pain. 20 tablet 0  . QUEtiapine (SEROQUEL XR) 300 MG 24 hr tablet Take 300 mg by mouth at bedtime.    Caroline [name] QUEtiapine (SEROQUEL) 400 MG tablet Take 400 mg by mouth at bedtime.     . risperiDONE (RISPERDAL) 2 MG  tablet Take 1 mg by mouth 3 (three) times daily.    . traMADol (ULTRAM) 50 MG tablet Take 1 tablet (50 mg total) by mouth every 12 (twelve) hours as needed for severe pain. 20 tablet 0   No current facility-administered medications for this visit.    Allergies  Review of patient's allergies indicates no known allergies.  Electrocardiogram:  06/13/14  SR LAD poor R wave progression   Assessment and Plan CAD: Stable with no angina and good activity level.  Continue medical Rx  DCM: no clinical CHF Rx limited by postural hypotension  Postural: She needs to see Dr Clovis Pu and revise her Rx.  Her neuro status and affect are worse with more slurred speech The seroquel, risperdal, norco and klonopin are all contributing to her low sodium dizzyness and postural symptoms Very Concerned that she is not taking her cortef after low cortisol level and abnormal stim test during last hospitalization Daughter Will call us tomorrow after checking pill bottles at home  Will refer to [facility] for closer f/u  Carotid: Known RICA occlusion left patent but CNS flow dependant on circle of [place] and not ideal to have such low BP  F/U with me in 3 months  Jenkins Rouge

## 2015-07-27 ENCOUNTER — Telehealth: Payer: Self-pay | Admitting: Cardiovascular Disease

## 2015-07-27 NOTE — Telephone Encounter (Signed)
Follow Up    Pts daughter is calling to speak to [facility] regarding medication clarification. States she spoke with both Dr. Johnsie Cancel and Janan Halter during most recent office visit with pt and is requesting to speak with Claiborne Billings again. Please call.

## 2015-07-27 NOTE — Telephone Encounter (Signed)
New message     Pt was seen by Dr Johnsie Cancel yesterday.  Daughter is calling to tell Claiborne Billings that patient has hydrocortison tablets 5mg  at home but do not have hydrocortison 10mg   She was told to call back and give this info.  Daughter is a Pharmacist, hospital and cannot be called back.  She will call again around 11am to talk to a nurse

## 2015-07-29 NOTE — Telephone Encounter (Signed)
Need to call daughter and make sure she is taking steroids

## 2015-07-30 ENCOUNTER — Ambulatory Visit: Payer: Medicare Other | Admitting: Physician Assistant

## 2015-07-30 NOTE — Telephone Encounter (Addendum)
LM TO CALL BACK ./cy

## 2015-07-31 ENCOUNTER — Encounter: Payer: Self-pay | Admitting: Physician Assistant

## 2015-07-31 ENCOUNTER — Ambulatory Visit (INDEPENDENT_AMBULATORY_CARE_PROVIDER_SITE_OTHER): Payer: Medicare Other | Admitting: Physician Assistant

## 2015-07-31 ENCOUNTER — Telehealth: Payer: Self-pay | Admitting: Cardiovascular Disease

## 2015-07-31 VITALS — BP 91/55 | HR 88 | Temp 98.1°F | Resp 12 | Ht 63.0 in | Wt 112.0 lb

## 2015-07-31 DIAGNOSIS — I9589 Other hypotension: Secondary | ICD-10-CM

## 2015-07-31 MED ORDER — HYDROCORTISONE 10 MG PO TABS: 10.0000 mg | ORAL_TABLET | Freq: Every day | ORAL | Status: DC

## 2015-07-31 MED ORDER — HYDROCORTISONE 5 MG PO TABS: 5.0000 mg | ORAL_TABLET | Freq: Two times a day (BID) | ORAL | Status: DC

## 2015-07-31 NOTE — Telephone Encounter (Signed)
LM TO CALL BACK RE   IF  TAKING  CORTEF  AND  IF SO  HOW  MUCH PER  DAUGHTER  PT HAS  5 MG  AT  HOME NO  10 MG  TABS./CY

## 2015-07-31 NOTE — Progress Notes (Signed)
Patient presents to clinic today for follow-up of hypotension. Was admitted to the hospital 1.5 months ago for syncopal event. Workup revealed significant hypotension thought to be combination of her psychiatric medications and potential adrenal insufficiency. Was discharged on hydrocortisone and with Cardiology follow-up. Has seen Cardiology this week who resumed her Cortef and is setting her up with Endocrinology. Was advised that there was extreme concern her hypotension and dizziness were stemming from such high doses of her Seroquel and Risperdal, an observation that has already been discussed in detail by myself.  Medication is prescribed by her Psychiatrist, Dr. Clovis Pu. Patient states her medication is not causing her symptoms and she has not been having dizziness or lightheadedness. Daughter is present and states that she does not feel mother is taking the hydrocortisone as "her pill bottle is full".  Past Medical History  Diagnosis Date  . GERD (gastroesophageal reflux disease)   . Dyslipidemia   . CAD (coronary artery disease)     [facility]  Diagonal instent restenosis treated 10/2005  . Myocardial infarction     3 stents 1st 2004, has had several  . Heart murmur     states years ago  . Shortness of breath     with exertion  . Bright disease   . Cancer     right breast Feb 03, 2001  . Anxiety   . Arthritis     back  . CHF (congestive heart failure)     states she was told se had this in past by MD  . Angina   . Hypertension     states a long time ago-states she never took medication  . Depression     2002/02/03 had shock treatment due to depresson, spouse passed away Feb 04, 2000  . Carotid artery occlusion   . Atrial fibrillation   . Grave's disease     pt denies    Current Outpatient Prescriptions on File Prior to Visit  Medication Sig Dispense Refill  . aspirin 325 MG tablet Take 325 mg by mouth daily.    . Black Cohosh 540 MG CAPS Take 540 mg by mouth 2 (two) times daily.    .  clonazePAM (KLONOPIN) 1 MG tablet Take 2 mg by mouth 2 (two) times daily. Takes 1 tablet BID and 2 tablets at Bedtime.    . clopidogrel (PLAVIX) 75 MG tablet Take 1 tablet (75 mg total) by mouth daily. 30 tablet 11  . HYDROcodone-acetaminophen (NORCO/VICODIN) 5-325 MG per tablet Take 1-2 tablets by mouth every 4 (four) hours as needed for moderate pain. 20 tablet 0  . QUEtiapine (SEROQUEL XR) 300 MG 24 hr tablet Take 600 mg by mouth at bedtime.    [name] QUEtiapine (SEROQUEL) 400 MG tablet Take 400 mg by mouth at bedtime.     . risperiDONE (RISPERDAL) 2 MG tablet Take 2 mg by mouth 3 (three) times daily.     . traMADol (ULTRAM) 50 MG tablet Take 1 tablet (50 mg total) by mouth every 12 (twelve) hours as needed for severe pain. 20 tablet 0   No current facility-administered medications on file prior to visit.    No Known Allergies  Family History  Problem Relation Age of Onset  . Coronary artery disease Father   . Prostate cancer Father     not sure  . Pancreatic cancer Father     not sure  . Cancer Father     Pancreatic and  Prostate  . Heart disease Father  Before age 32 and BPG  . Hyperlipidemia Father   . Heart attack Father   . Hypertension Mother   . Diabetes Sister   . Colon cancer Neg Hx     Social History   Social History  . Marital Status: Widowed    Spouse Name: N/A  . Number of Children: N/A  . Years of Education: N/A   Social History Main Topics  . Smoking status: Former Smoker -- 0.50 packs/day for 40 years    Types: Cigarettes  . Smokeless tobacco: Never Used     Comment: Quit >2 months ago  . Alcohol Use: No  . Drug Use: No  . Sexual Activity: Not Asked   Other Topics Concern  . None   Social History Narrative   She has 1 child.  She is on disability.    Review of Systems - See HPI.  All other ROS are negative.  BP 91/55 mmHg  Pulse 88  Temp(Src) 98.1 F (36.7 C) (Oral)  Resp 12  Ht $R'5\' 3"'bs$  (1.6 m)  Wt 112 lb (50.803 kg)  BMI 19.84 kg/m2   SpO2 99%  Physical Exam  Constitutional: She is oriented to person, place, and time and well-developed, well-nourished, and in no distress.  HENT:  Head: Normocephalic and atraumatic.  Eyes: Conjunctivae are normal.  Cardiovascular: Normal rate, regular rhythm, normal heart sounds and intact distal pulses.   Pulmonary/Chest: Effort normal and breath sounds normal. No respiratory distress. She has no wheezes. She has no rales. She exhibits no tenderness.  Neurological: She is alert and oriented to person, place, and time.  Skin: Skin is warm and dry. No rash noted.  Psychiatric: She has a flat affect.  Vitals reviewed.   Recent Results (from the past 2160 hour(s))  Basic metabolic panel     Status: Abnormal   Collection Time: 05/29/15  1:51 PM  Result Value Ref Range   Sodium 132 (L) 135 - 145 mEq/L   Potassium 3.8 3.5 - 5.1 mEq/L   Chloride 99 96 - 112 mEq/L   CO2 23 19 - 32 mEq/L   Glucose, Bld 187 (H) 70 - 99 mg/dL   BUN 20 6 - 23 mg/dL   Creatinine, Ser 1.96 (H) 0.40 - 1.20 mg/dL   Calcium 10.3 8.4 - 10.5 mg/dL   GFR 27.46 (L) >60.00 mL/min  Type and screen     Status: None   Collection Time: 06/09/15  6:47 PM  Result Value Ref Range   ABO/RH(D) A POS    Antibody Screen NEG    Sample Expiration 96/43/8381   Basic metabolic panel     Status: Abnormal   Collection Time: 06/09/15  6:50 PM  Result Value Ref Range   Sodium 133 (L) 135 - 145 mmol/L   Potassium 4.2 3.5 - 5.1 mmol/L   Chloride 100 (L) 101 - 111 mmol/L   CO2 21 (L) 22 - 32 mmol/L   Glucose, Bld 92 65 - 99 mg/dL   BUN 16 6 - 20 mg/dL   Creatinine, Ser 1.92 (H) 0.44 - 1.00 mg/dL   Calcium 9.0 8.9 - 10.3 mg/dL   GFR calc non Af Amer 27 (L) >60 mL/min   GFR calc Af Amer 31 (L) >60 mL/min    Comment: (NOTE) The eGFR has been calculated using the CKD EPI equation. This calculation has not been validated in all clinical situations. eGFR's persistently <60 mL/min signify possible Chronic Kidney Disease.     Anion gap 12  5 - 15  CBC WITH DIFFERENTIAL     Status: Abnormal   Collection Time: 06/09/15  6:50 PM  Result Value Ref Range   WBC 8.2 4.0 - 10.5 K/uL   RBC 3.96 3.87 - 5.11 MIL/uL   Hemoglobin 11.5 (L) 12.0 - 15.0 g/dL   HCT 14.7 (L) 72.6 - 77.1 %   MCV 86.1 78.0 - 100.0 fL   MCH 29.0 26.0 - 34.0 pg   MCHC 33.7 30.0 - 36.0 g/dL   RDW 37.0 74.0 - 88.9 %   Platelets 232 150 - 400 K/uL   Neutrophils Relative % 75 43 - 77 %   Neutro Abs 6.1 1.7 - 7.7 K/uL   Lymphocytes Relative 14 12 - 46 %   Lymphs Abs 1.2 0.7 - 4.0 K/uL   Monocytes Relative 9 3 - 12 %   Monocytes Absolute 0.8 0.1 - 1.0 K/uL   Eosinophils Relative 1 0 - 5 %   Eosinophils Absolute 0.1 0.0 - 0.7 K/uL   Basophils Relative 1 0 - 1 %   Basophils Absolute 0.0 0.0 - 0.1 K/uL  Protime-INR     Status: None   Collection Time: 06/09/15  6:50 PM  Result Value Ref Range   Prothrombin Time 13.2 11.6 - 15.2 seconds   INR 0.98 0.00 - 1.49  Troponin I (q 6hr x 3)     Status: None   Collection Time: 06/10/15 12:01 AM  Result Value Ref Range   Troponin I <0.03 <0.031 ng/mL    Comment:        NO INDICATION OF MYOCARDIAL INJURY.   Troponin I (q 6hr x 3)     Status: None   Collection Time: 06/10/15  5:34 AM  Result Value Ref Range   Troponin I <0.03 <0.031 ng/mL    Comment:        NO INDICATION OF MYOCARDIAL INJURY.   Troponin I (q 6hr x 3)     Status: None   Collection Time: 06/10/15 12:10 PM  Result Value Ref Range   Troponin I <0.03 <0.031 ng/mL    Comment:        NO INDICATION OF MYOCARDIAL INJURY.   Basic metabolic panel     Status: Abnormal   Collection Time: 06/11/15  6:12 AM  Result Value Ref Range   Sodium 132 (L) 135 - 145 mmol/L   Potassium 4.0 3.5 - 5.1 mmol/L   Chloride 100 (L) 101 - 111 mmol/L   CO2 23 22 - 32 mmol/L   Glucose, Bld 88 65 - 99 mg/dL   BUN 16 6 - 20 mg/dL   Creatinine, Ser 3.44 (H) 0.44 - 1.00 mg/dL   Calcium 9.5 8.9 - 81.1 mg/dL   GFR calc non Af Amer 39 (L) >60 mL/min   GFR calc  Af Amer 45 (L) >60 mL/min    Comment: (NOTE) The eGFR has been calculated using the CKD EPI equation. This calculation has not been validated in all clinical situations. eGFR's persistently <60 mL/min signify possible Chronic Kidney Disease.    Anion gap 9 5 - 15  CBC     Status: Abnormal   Collection Time: 06/12/15  7:28 AM  Result Value Ref Range   WBC 3.9 (L) 4.0 - 10.5 K/uL   RBC 3.80 (L) 3.87 - 5.11 MIL/uL   Hemoglobin 10.8 (L) 12.0 - 15.0 g/dL   HCT 75.6 (L) 74.8 - 05.5 %   MCV 83.4 78.0 - 100.0 fL  MCH 28.4 26.0 - 34.0 pg   MCHC 34.1 30.0 - 36.0 g/dL   RDW 14.7 11.5 - 15.5 %   Platelets 223 150 - 400 K/uL  Basic metabolic panel     Status: Abnormal   Collection Time: 06/12/15  7:28 AM  Result Value Ref Range   Sodium 126 (L) 135 - 145 mmol/L   Potassium 3.9 3.5 - 5.1 mmol/L   Chloride 95 (L) 101 - 111 mmol/L   CO2 22 22 - 32 mmol/L   Glucose, Bld 99 65 - 99 mg/dL   BUN 16 6 - 20 mg/dL   Creatinine, Ser 1.50 (H) 0.44 - 1.00 mg/dL   Calcium 9.3 8.9 - 10.3 mg/dL   GFR calc non Af Amer 36 (L) >60 mL/min   GFR calc Af Amer 42 (L) >60 mL/min    Comment: (NOTE) The eGFR has been calculated using the CKD EPI equation. This calculation has not been validated in all clinical situations. eGFR's persistently <60 mL/min signify possible Chronic Kidney Disease.    Anion gap 9 5 - 15  Osmolality     Status: Abnormal   Collection Time: 06/12/15  3:39 PM  Result Value Ref Range   Osmolality 266 (L) 275 - 300 mOsm/kg    Comment: Performed at [facility]  TSH     Status: None   Collection Time: 06/12/15  3:39 PM  Result Value Ref Range   TSH 4.311 0.350 - 4.500 uIU/mL  Cortisol     Status: None   Collection Time: 06/12/15  3:39 PM  Result Value Ref Range   Cortisol, Plasma 7.9 ug/dL    Comment: (NOTE) AM    6.7 - 22.6 ug/dL PM   <10.0       ug/dL   Osmolality, urine     Status: Abnormal   Collection Time: 06/12/15  7:00 PM  Result Value Ref Range    Osmolality, Ur 204 (L) 390 - 1090 mOsm/kg    Comment: Performed at [facility]  Sodium, urine, random     Status: None   Collection Time: 06/12/15  7:00 PM  Result Value Ref Range   Sodium, Ur 20 mmol/L  Creatinine, urine, random     Status: None   Collection Time: 06/12/15  7:00 PM  Result Value Ref Range   Creatinine, Urine 44.52 mg/dL  Urinalysis, Routine w reflex microscopic (not at [facility])     Status: None   Collection Time: 06/12/15 11:49 PM  Result Value Ref Range   Color, Urine YELLOW YELLOW   APPearance CLEAR CLEAR   Specific Gravity, Urine 1.005 1.005 - 1.030   pH 6.5 5.0 - 8.0   Glucose, UA NEGATIVE NEGATIVE mg/dL   Hgb urine dipstick NEGATIVE NEGATIVE   Bilirubin Urine NEGATIVE NEGATIVE   Ketones, ur NEGATIVE NEGATIVE mg/dL   Protein, ur NEGATIVE NEGATIVE mg/dL   Urobilinogen, UA 0.2 0.0 - 1.0 mg/dL   Nitrite NEGATIVE NEGATIVE   Leukocytes, UA NEGATIVE NEGATIVE    Comment: MICROSCOPIC NOT DONE ON URINES WITH NEGATIVE PROTEIN, BLOOD, LEUKOCYTES, NITRITE, OR GLUCOSE <1000 mg/dL.  Culture, Urine     Status: None   Collection Time: 06/12/15 11:49 PM  Result Value Ref Range   Specimen Description URINE, RANDOM    Special Requests NONE    Culture MULTIPLE SPECIES PRESENT, SUGGEST RECOLLECTION    Report Status 06/14/2015 FINAL   Basic metabolic panel     Status: Abnormal   Collection Time: 06/13/15  5:20  AM  Result Value Ref Range   Sodium 126 (L) 135 - 145 mmol/L   Potassium 4.5 3.5 - 5.1 mmol/L   Chloride 95 (L) 101 - 111 mmol/L   CO2 24 22 - 32 mmol/L   Glucose, Bld 130 (H) 65 - 99 mg/dL   BUN 18 6 - 20 mg/dL   Creatinine, Ser 1.32 (H) 0.44 - 1.00 mg/dL   Calcium 9.2 8.9 - 10.3 mg/dL   GFR calc non Af Amer 42 (L) >60 mL/min   GFR calc Af Amer 49 (L) >60 mL/min    Comment: (NOTE) The eGFR has been calculated using the CKD EPI equation. This calculation has not been validated in all clinical situations. eGFR's persistently <60 mL/min signify possible  Chronic Kidney Disease.    Anion gap 7 5 - 15  CBC     Status: Abnormal   Collection Time: 06/13/15  5:20 AM  Result Value Ref Range   WBC 4.9 4.0 - 10.5 K/uL   RBC 3.55 (L) 3.87 - 5.11 MIL/uL   Hemoglobin 10.4 (L) 12.0 - 15.0 g/dL   HCT 29.8 (L) 36.0 - 46.0 %   MCV 83.9 78.0 - 100.0 fL   MCH 29.3 26.0 - 34.0 pg   MCHC 34.9 30.0 - 36.0 g/dL   RDW 14.7 11.5 - 15.5 %   Platelets 233 150 - 400 K/uL  Magnesium     Status: Abnormal   Collection Time: 06/13/15  5:20 AM  Result Value Ref Range   Magnesium 1.6 (L) 1.7 - 2.4 mg/dL  Basic metabolic panel     Status: Abnormal   Collection Time: 06/13/15  5:02 PM  Result Value Ref Range   Sodium 127 (L) 135 - 145 mmol/L   Potassium 4.8 3.5 - 5.1 mmol/L   Chloride 97 (L) 101 - 111 mmol/L   CO2 19 (L) 22 - 32 mmol/L   Glucose, Bld 147 (H) 65 - 99 mg/dL   BUN 20 6 - 20 mg/dL   Creatinine, Ser 1.25 (H) 0.44 - 1.00 mg/dL   Calcium 9.3 8.9 - 10.3 mg/dL   GFR calc non Af Amer 45 (L) >60 mL/min   GFR calc Af Amer 52 (L) >60 mL/min    Comment: (NOTE) The eGFR has been calculated using the CKD EPI equation. This calculation has not been validated in all clinical situations. eGFR's persistently <60 mL/min signify possible Chronic Kidney Disease.    Anion gap 11 5 - 15  ACTH stimulation, 3 time points     Status: None   Collection Time: 06/14/15  5:00 AM  Result Value Ref Range   Cortisol, Base 1.4 ug/dL    Comment: NO NORMAL RANGE ESTABLISHED FOR THIS TEST   Cortisol, 30 Min 17.7 ug/dL   Cortisol, 60 Min 28.2 ug/dL  CBC     Status: Abnormal   Collection Time: 06/14/15  5:00 AM  Result Value Ref Range   WBC 6.7 4.0 - 10.5 K/uL   RBC 3.13 (L) 3.87 - 5.11 MIL/uL   Hemoglobin 9.1 (L) 12.0 - 15.0 g/dL   HCT 26.5 (L) 36.0 - 46.0 %   MCV 84.7 78.0 - 100.0 fL   MCH 29.1 26.0 - 34.0 pg   MCHC 34.3 30.0 - 36.0 g/dL   RDW 15.2 11.5 - 15.5 %   Platelets 252 150 - 400 K/uL  Basic metabolic panel     Status: Abnormal   Collection Time:  06/14/15  5:00 AM  Result Value Ref  Range   Sodium 126 (L) 135 - 145 mmol/L   Potassium 4.9 3.5 - 5.1 mmol/L   Chloride 95 (L) 101 - 111 mmol/L   CO2 22 22 - 32 mmol/L   Glucose, Bld 99 65 - 99 mg/dL   BUN 20 6 - 20 mg/dL   Creatinine, Ser 1.28 (H) 0.44 - 1.00 mg/dL   Calcium 9.2 8.9 - 10.3 mg/dL   GFR calc non Af Amer 44 (L) >60 mL/min   GFR calc Af Amer 51 (L) >60 mL/min    Comment: (NOTE) The eGFR has been calculated using the CKD EPI equation. This calculation has not been validated in all clinical situations. eGFR's persistently <60 mL/min signify possible Chronic Kidney Disease.    Anion gap 9 5 - 15  Basic metabolic panel     Status: Abnormal   Collection Time: 06/14/15  4:14 PM  Result Value Ref Range   Sodium 127 (L) 135 - 145 mmol/L   Potassium 5.1 3.5 - 5.1 mmol/L   Chloride 96 (L) 101 - 111 mmol/L   CO2 22 22 - 32 mmol/L   Glucose, Bld 134 (H) 65 - 99 mg/dL   BUN 24 (H) 6 - 20 mg/dL   Creatinine, Ser 1.42 (H) 0.44 - 1.00 mg/dL   Calcium 9.0 8.9 - 10.3 mg/dL   GFR calc non Af Amer 39 (L) >60 mL/min   GFR calc Af Amer 45 (L) >60 mL/min    Comment: (NOTE) The eGFR has been calculated using the CKD EPI equation. This calculation has not been validated in all clinical situations. eGFR's persistently <60 mL/min signify possible Chronic Kidney Disease.    Anion gap 9 5 - 15  Magnesium     Status: None   Collection Time: 06/14/15  4:14 PM  Result Value Ref Range   Magnesium 1.9 1.7 - 2.4 mg/dL  Basic metabolic panel     Status: Abnormal   Collection Time: 06/15/15  5:27 AM  Result Value Ref Range   Sodium 130 (L) 135 - 145 mmol/L   Potassium 4.9 3.5 - 5.1 mmol/L   Chloride 101 101 - 111 mmol/L   CO2 21 (L) 22 - 32 mmol/L   Glucose, Bld 99 65 - 99 mg/dL   BUN 19 6 - 20 mg/dL   Creatinine, Ser 1.17 (H) 0.44 - 1.00 mg/dL   Calcium 8.7 (L) 8.9 - 10.3 mg/dL   GFR calc non Af Amer 49 (L) >60 mL/min   GFR calc Af Amer 57 (L) >60 mL/min    Comment: (NOTE) The eGFR  has been calculated using the CKD EPI equation. This calculation has not been validated in all clinical situations. eGFR's persistently <60 mL/min signify possible Chronic Kidney Disease.    Anion gap 8 5 - 15  CBC     Status: Abnormal   Collection Time: 06/15/15  5:27 AM  Result Value Ref Range   WBC 6.2 4.0 - 10.5 K/uL   RBC 3.04 (L) 3.87 - 5.11 MIL/uL   Hemoglobin 8.8 (L) 12.0 - 15.0 g/dL   HCT 26.0 (L) 36.0 - 46.0 %   MCV 85.5 78.0 - 100.0 fL   MCH 28.9 26.0 - 34.0 pg   MCHC 33.8 30.0 - 36.0 g/dL   RDW 15.2 11.5 - 15.5 %   Platelets 237 150 - 400 K/uL  Cortisol     Status: None   Collection Time: 06/15/15  5:27 AM  Result Value Ref Range   Cortisol, Plasma  1.8 ug/dL    Comment: (NOTE) AM    6.7 - 22.6 ug/dL PM   <10.0       ug/dL   ACTH stimulation, 3 time points     Status: None   Collection Time: 06/15/15  6:34 AM  Result Value Ref Range   Cortisol, Base 1.8 ug/dL    Comment: NO NORMAL RANGE ESTABLISHED FOR THIS TEST   Cortisol, 30 Min 1.9 ug/dL   Cortisol, 60 Min 2.0 ug/dL  Basic metabolic panel     Status: Abnormal   Collection Time: 06/16/15  5:40 AM  Result Value Ref Range   Sodium 129 (L) 135 - 145 mmol/L   Potassium 4.5 3.5 - 5.1 mmol/L   Chloride 100 (L) 101 - 111 mmol/L   CO2 24 22 - 32 mmol/L   Glucose, Bld 93 65 - 99 mg/dL   BUN 15 6 - 20 mg/dL   Creatinine, Ser 1.17 (H) 0.44 - 1.00 mg/dL   Calcium 8.8 (L) 8.9 - 10.3 mg/dL   GFR calc non Af Amer 49 (L) >60 mL/min   GFR calc Af Amer 57 (L) >60 mL/min    Comment: (NOTE) The eGFR has been calculated using the CKD EPI equation. This calculation has not been validated in all clinical situations. eGFR's persistently <60 mL/min signify possible Chronic Kidney Disease.    Anion gap 5 5 - 15  Magnesium     Status: None   Collection Time: 06/16/15  5:40 AM  Result Value Ref Range   Magnesium 1.7 1.7 - 2.4 mg/dL  Folate RBC     Status: Abnormal   Collection Time: 06/17/15  5:32 AM  Result Value Ref  Range   Folate, Hemolysate >620.0 Not Estab. ng/mL   Hematocrit 27.4 (L) 34.0 - 46.6 %   Folate, RBC >2263 >498 ng/mL    Comment: (NOTE) Performed At: [facility] [place], [place] [id number] Lindon Romp MD BE:[id number]   Ferritin     Status: None   Collection Time: 06/17/15  5:35 AM  Result Value Ref Range   Ferritin 43 11 - 307 ng/mL  Transferrin     Status: Abnormal   Collection Time: 06/17/15  5:35 AM  Result Value Ref Range   Transferrin 161 (L) 192 - 382 mg/dL  Iron and TIBC     Status: Abnormal   Collection Time: 06/17/15  5:35 AM  Result Value Ref Range   Iron 71 28 - 170 ug/dL   TIBC 225 (L) 250 - 450 ug/dL   Saturation Ratios 31 10.4 - 31.8 %   UIBC 154 ug/dL  Vitamin B12     Status: None   Collection Time: 06/17/15  5:35 AM  Result Value Ref Range   Vitamin B-12 462 180 - 914 pg/mL    Comment: (NOTE) This assay is not validated for testing neonatal or myeloproliferative syndrome specimens for Vitamin B12 levels.   Reticulocytes     Status: Abnormal   Collection Time: 06/17/15  5:35 AM  Result Value Ref Range   Retic Ct Pct 1.9 0.4 - 3.1 %   RBC. 3.01 (L) 3.87 - 5.11 MIL/uL   Retic Count, Manual 57.2 19.0 - 186.0 K/uL  CBC     Status: Abnormal   Collection Time: 06/17/15  7:52 AM  Result Value Ref Range   WBC 7.0 4.0 - 10.5 K/uL   RBC 3.09 (L) 3.87 - 5.11 MIL/uL   Hemoglobin 8.9 (L) 12.0 - 15.0  g/dL   HCT 26.5 (L) 36.0 - 46.0 %   MCV 85.8 78.0 - 100.0 fL   MCH 28.8 26.0 - 34.0 pg   MCHC 33.6 30.0 - 36.0 g/dL   RDW 15.7 (H) 11.5 - 15.5 %   Platelets 257 150 - 400 K/uL  Basic metabolic panel     Status: Abnormal   Collection Time: 06/17/15  7:52 AM  Result Value Ref Range   Sodium 127 (L) 135 - 145 mmol/L   Potassium 4.9 3.5 - 5.1 mmol/L   Chloride 95 (L) 101 - 111 mmol/L   CO2 27 22 - 32 mmol/L   Glucose, Bld 99 65 - 99 mg/dL   BUN 31 (H) 6 - 20 mg/dL   Creatinine, Ser 1.35 (H) 0.44 - 1.00 mg/dL   Calcium 9.2 8.9 - 10.3  mg/dL   GFR calc non Af Amer 41 (L) >60 mL/min   GFR calc Af Amer 48 (L) >60 mL/min    Comment: (NOTE) The eGFR has been calculated using the CKD EPI equation. This calculation has not been validated in all clinical situations. eGFR's persistently <60 mL/min signify possible Chronic Kidney Disease.    Anion gap 5 5 - 15  Basic metabolic panel     Status: Abnormal   Collection Time: 06/17/15  4:57 PM  Result Value Ref Range   Sodium 130 (L) 135 - 145 mmol/L   Potassium 4.9 3.5 - 5.1 mmol/L   Chloride 94 (L) 101 - 111 mmol/L   CO2 26 22 - 32 mmol/L   Glucose, Bld 116 (H) 65 - 99 mg/dL   BUN 34 (H) 6 - 20 mg/dL   Creatinine, Ser 1.48 (H) 0.44 - 1.00 mg/dL   Calcium 9.5 8.9 - 10.3 mg/dL   GFR calc non Af Amer 37 (L) >60 mL/min   GFR calc Af Amer 43 (L) >60 mL/min    Comment: (NOTE) The eGFR has been calculated using the CKD EPI equation. This calculation has not been validated in all clinical situations. eGFR's persistently <60 mL/min signify possible Chronic Kidney Disease.    Anion gap 10 5 - 15  CBC     Status: Abnormal   Collection Time: 06/18/15  4:46 AM  Result Value Ref Range   WBC 7.0 4.0 - 10.5 K/uL   RBC 3.00 (L) 3.87 - 5.11 MIL/uL   Hemoglobin 8.7 (L) 12.0 - 15.0 g/dL   HCT 25.9 (L) 36.0 - 46.0 %   MCV 86.3 78.0 - 100.0 fL   MCH 29.0 26.0 - 34.0 pg   MCHC 33.6 30.0 - 36.0 g/dL   RDW 15.9 (H) 11.5 - 15.5 %   Platelets 279 150 - 400 K/uL  Basic metabolic panel     Status: Abnormal   Collection Time: 06/18/15  4:46 AM  Result Value Ref Range   Sodium 128 (L) 135 - 145 mmol/L   Potassium 4.8 3.5 - 5.1 mmol/L   Chloride 96 (L) 101 - 111 mmol/L   CO2 27 22 - 32 mmol/L   Glucose, Bld 113 (H) 65 - 99 mg/dL   BUN 33 (H) 6 - 20 mg/dL   Creatinine, Ser 1.25 (H) 0.44 - 1.00 mg/dL   Calcium 9.1 8.9 - 10.3 mg/dL   GFR calc non Af Amer 45 (L) >60 mL/min   GFR calc Af Amer 52 (L) >60 mL/min    Comment: (NOTE) The eGFR has been calculated using the CKD EPI  equation. This calculation has not been  validated in all clinical situations. eGFR's persistently <60 mL/min signify possible Chronic Kidney Disease.    Anion gap 5 5 - 15    Assessment/Plan: Hypotension Upcoming appointment with Endocrinology. Again, like Cardiology, feel that her symptoms are stemming from such high doses of Seroquel and Risperdal. Patient refusing to believe this. Refusing to schedule follow-up with Dr. Clovis Pu. As I do not prescribe these medications for her I am limited in what I can do for her. Discussed this with patient and daughter. Will contact Dr. Casimiro Needle office to discuss. Patient is amenable to this. Resume Cortef twice daily but will increase AM dose to 15 mg. Recommended patient stop AM dose of Seroquel. Stay hydrated and increase salt intake. Follow-up with Cardiology as scheduled.

## 2015-07-31 NOTE — Telephone Encounter (Signed)
PER  PT  TAKING  HYDROCORTISONE  5 MG  BID   WILL  SEND  TO DR Johnsie Cancel  FOR REVIEW .Adonis Housekeeper

## 2015-07-31 NOTE — Patient Instructions (Signed)
Please decrease your morning dose of Seroquel to every other day for 1 week before stopping morning dose. Continue other medications as directed.    Please make sure to take the Cortef twice daily as directed.  I will contact Dr. Johnsie Cancel to see what he decided regarding your Cortef dosing. I will also contact Dr. Clovis Pu to make him aware of you current medical issues so he can begin working on your medications.  You will be contacted by an Endocrinologist for further assessment.

## 2015-07-31 NOTE — Telephone Encounter (Signed)
New problem ° ° °Pt returning your call. °

## 2015-07-31 NOTE — Telephone Encounter (Signed)
Pt scheduled to follow up with Elyn Aquas, PA-C today (07/31/15) at 2:30 pm.

## 2015-07-31 NOTE — Telephone Encounter (Signed)
F/u  Pt returning Christine's call- originally asked for [facility] to explain certain at home medications for pt. Please call back and discuss.

## 2015-08-03 NOTE — Assessment & Plan Note (Signed)
Upcoming appointment with Endocrinology. Again, like Cardiology, feel that her symptoms are stemming from such high doses of Seroquel and Risperdal. Patient refusing to believe this. Refusing to schedule follow-up with Dr. Clovis Pu. As I do not prescribe these medications for her I am limited in what I can do for her. Discussed this with patient and daughter. Will contact Dr. Casimiro Needle office to discuss. Patient is amenable to this. Resume Cortef twice daily but will increase AM dose to 15 mg. Recommended patient stop AM dose of Seroquel. Stay hydrated and increase salt intake. Follow-up with Cardiology as scheduled.

## 2015-08-09 ENCOUNTER — Telehealth: Payer: Self-pay | Admitting: Physician Assistant

## 2015-08-09 NOTE — Telephone Encounter (Signed)
LM for Scott to go back out or at least contact pt for another visit.

## 2015-08-09 NOTE — Telephone Encounter (Signed)
Noted. If she is refusing care then she is taking responsibility for her own health. If she is sick, I hope she feels better. Will you see if therapy will try to go back out another day?

## 2015-08-09 NOTE — Telephone Encounter (Signed)
Verbal order given. Please fax over written orders.

## 2015-08-09 NOTE — Telephone Encounter (Signed)
Caller name: Nicki Reaper with [facility] Can be reached: [phone]  Reason for call: Nicki Reaper called to notify pt refused skilled nursing visit today. She said she had a cold and hung up on him.

## 2015-08-09 NOTE — Telephone Encounter (Signed)
Caroline [name] with [facility] needs verbal approval for an additional social work visit to assist with services in the home and advance directives.

## 2015-08-10 ENCOUNTER — Telehealth: Payer: Self-pay | Admitting: Physician Assistant

## 2015-08-10 NOTE — Telephone Encounter (Signed)
LMOM with contact name and number for return call RE: Mother's missed appointment with Therapist and needed Social worker appointment [already approved by our office] per provider instructions, as patient is non-compliant on own/SLS

## 2015-08-10 NOTE — Telephone Encounter (Signed)
Can we please try to call Ms. Svehla to make sure she is ok? Actually, can we call her daughter (Emergency Contact) to make sure she can check on patient as well. I am sure Emine is refusing services she does not want (history of this) but I want to make sure she is ok. If no answer from either, ok to call police for a well-fare check

## 2015-08-10 NOTE — Telephone Encounter (Signed)
Pt's daughter returned your call. She says that she will be home for the rest of the day.   Home#: [phone]

## 2015-08-10 NOTE — Telephone Encounter (Signed)
We need to call and give verbal order as this was not done yesterday as requested. Also have them fax over written orders for signature.

## 2015-08-10 NOTE — Telephone Encounter (Signed)
LMOM with contact name and number for return call RE: VO for Social Work assistance and for written orders to be faxed over at [phone] per provider instructions/SLS

## 2015-08-10 NOTE — Telephone Encounter (Signed)
Caroline [name] with [facility] Ph#: [phone]  Pt did not answer today for PT appt. She went by the home and pt did not answer. The pt previously informed them she was not feeling well. Pt is now not answering phone or answering the door. Pt last seen by [facility] 08/02/15 by Education officer, museum.

## 2015-08-13 ENCOUNTER — Telehealth: Payer: Self-pay | Admitting: Physician Assistant

## 2015-08-13 NOTE — Telephone Encounter (Signed)
Daughter calling back to discuss message below

## 2015-08-14 NOTE — Telephone Encounter (Signed)
error:[id number] ° °

## 2015-08-15 ENCOUNTER — Telehealth: Payer: Self-pay | Admitting: Physician Assistant

## 2015-08-15 NOTE — Telephone Encounter (Signed)
Ok to call and give verbal order. 

## 2015-08-15 NOTE — Telephone Encounter (Signed)
HHRN informed of verbal ok. 

## 2015-08-15 NOTE — Telephone Encounter (Signed)
Caller name: Nicki Reaper  Relation to pt: RN [facility] Call back number: [phone]    Reason for call:  Requesting verbal orders extending skilled nursing for 1x for the next 2 weeks

## 2015-08-22 NOTE — Telephone Encounter (Signed)
Spoke with Ebony Hail at [facility], she D/C patient from PT yesterday and the Nursing is due to D/C patient next week/SLS

## 2015-09-07 ENCOUNTER — Ambulatory Visit: Payer: Medicare Other | Admitting: Neurology

## 2015-09-11 ENCOUNTER — Encounter: Payer: Self-pay | Admitting: Internal Medicine

## 2015-09-11 ENCOUNTER — Ambulatory Visit (INDEPENDENT_AMBULATORY_CARE_PROVIDER_SITE_OTHER): Payer: Medicare Other | Admitting: Internal Medicine

## 2015-09-11 VITALS — BP 100/60 | HR 90 | Temp 97.9°F | Resp 12 | Ht 63.0 in | Wt 113.0 lb

## 2015-09-11 DIAGNOSIS — E2740 Unspecified adrenocortical insufficiency: Secondary | ICD-10-CM | POA: Diagnosis not present

## 2015-09-11 MED ORDER — HYDROCORTISONE 10 MG PO TABS: ORAL_TABLET | ORAL | Status: DC

## 2015-09-11 MED ORDER — HYDROCORTISONE 5 MG PO TABS: ORAL_TABLET | ORAL | Status: DC

## 2015-09-11 NOTE — Patient Instructions (Signed)
Please change the Hydrocortisone: 10 mg in am and 5 mg in pm, no later than 6 pm.  Stop Black Cohosh.  Sick days rules:  If you cannot keep anything down, including your hydrocortisone medication, please go to the emergency room or your primary care physician office to get steroids injected in the muscle or vein. Alternatively, you can inject 100 mg hydrocortisone in the muscle at home.  If you have a fever (more than 100 Fahrenheit) or gastroenteritis with nausea/vomiting and diarrhea, please double the dose of your hydrocortisone for the duration of the fever or the gastroenteritis.  Do not run out of your hydrocortisone medication.   Please come back in 2 weeks for a stimulation test. Please do not take the Hydrocortisone the afternoon before the test and also the morning of the test. Bring the dose with you and take it right after the test.

## 2015-09-11 NOTE — Progress Notes (Addendum)
Patient ID: Caroline [name], female   DOB: [birth year], 64 y.o.   MRN: [id number]   HPI  Caroline [name] is a 64 y.o.-year-old female, referred by her PCP, [name], PA-C , for evaluation and treatment for suspected adrenal insufficiency. She is here with her daughter who offers part of the history.  Pt. Has a history of orthostasis, lightheadedness, hypotension x 1 year (85/65), fell (fractured pelvic bone) -status post admission in 06/2015. At the time of her last admission, she had a low a.m. cortisol and 2 cosyntropin stimulation test performed, the first being normal, while the second was completely abnormal. They were done one day apart.  Component     Latest Ref Rng 06/12/2015 06/14/2015 06/15/2015  Cortisol, Base       1.4 1.8  Cortisol, 30 Min       17.7 1.9  Cortisol, 60 Min       28.2 2.0  Cortisol, Plasma      7.9  1.8   She was started on hydrocortisone in house, and she continues with 5 mg in a.m. and 10 mg at bedtime!  Of note, she had low cortisol levels in the past, with another normal cosyntropin stimulation test on 05/01/2008: Component     Latest Ref Rng 04/30/2008 05/01/2008 04/02/2009  Cortisol, Base       9.7 . . .   Cortisol, 30 Min     > 20  25.9   Cortisol, 60 Min     > 20  29.5   Cortisol - AM     4.3 - 22.4 ug/dL 2.8 (L)  3.8 (L)   All these tests were obtained while admitted to the hospital.   Also, it is to be noted that patient was on high doses of Seroquel, Risperdal and also on benzodiazepines. The doses were recently reduced.  The rest of the pituitary labs were normal, except a higher FSH: Component     Latest Ref Rng 04/02/2009 04/23/2012          2:20 PM  Estradiol       14.6  TSH     0.350 - 4.500 uIU/mL  2.731  Cortisol - AM     4.3 - 22.4 ug/dL 3.8 (L)   Free T4     0.80 - 1.80 ng/dL  0.88  FSH       169.2 (H)  LH       58.2   No h/o steroid use in the past. No h/o Depo-provera, Megace, po ketoconazole, phenytoin, rifampin,  chronic fluconazole use. No h/o autoimmune diseases in pt or family mbs. No excess use of NSAIDs. On Plavix.  No h/o generalized infections or HIV. No IVDA. No h/o head injury or severe HA. 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 She has CAD - 5 AMIs- Dr Johnsie Cancel.   Pt mentions: - + weight loss, now resolved; but low appetite - + fatigue - + Poor sleep - no nausea - no vomiting - no abdominal pain - no mm aches - + palpitations - chronic - no HAs - no syncopal episodes - no dark skin discoloration  She has a h/o hyponatremia, no hyperkalemia.   Chemistry      Component Value Date/Time   NA 128* 06/18/2015 0446   K 4.8 06/18/2015 0446   CL 96* 06/18/2015 0446   CO2 27 06/18/2015 0446   BUN 33* 06/18/2015 0446   CREATININE 1.25* 06/18/2015 4235  CREATININE 2.11* 05/18/2014 1429      Component Value Date/Time   CALCIUM 9.1 06/18/2015 0446   ALKPHOS 56 03/21/2015 1550   AST 14 03/21/2015 1550   ALT 11 03/21/2015 1550   BILITOT 0.3 03/21/2015 1550     ROS: Constitutional: See history of present illness Eyes: + blurry vision, no xerophthalmia ENT: no sore throat, no nodules palpated in throat, + dysphagia/no odynophagia, no hoarseness Cardiovascular: + CP/+ SOB/+ palpitations/no leg swelling Respiratory: no cough/SOB Gastrointestinal: no N/V/+ D/+ C/ + heartburn Musculoskeletal: no muscle/joint aches Skin: no rashes, + hair loss, + easy bruising Neurological: no tremors/numbness/tingling/dizziness Psychiatric: + Both: depression/anxiety  Past Medical History  Diagnosis Date  . GERD (gastroesophageal reflux disease)   . Dyslipidemia   . CAD (coronary artery disease)     [facility]  Diagonal instent restenosis treated 10/2005  . Myocardial infarction [facility])     3 stents 1st 2004, has had several  . Heart murmur     states years ago  . Shortness of breath     with exertion  . Bright disease   . Cancer [facility])     right breast 12/26/00  . Anxiety   .  Arthritis     back  . CHF (congestive heart failure) ([place])     states she was told se had this in past by MD  . Angina   . Hypertension     states a long time ago-states she never took medication  . Depression     2001-12-26 had shock treatment due to depresson, spouse passed away 1999/12/27  . Carotid artery occlusion   . Atrial fibrillation ([place])   . Grave's disease     pt denies   Past Surgical History  Procedure Laterality Date  . Breast lumpectomy      right breast   . Coronary angioplasty with stent placement    . Joint replacement  12-26-09    Left total hip replacement  . Abdominal hysterectomy      partial  . Mouth surgery    . Endarterectomy  01/08/2012    Procedure: ENDARTERECTOMY CAROTID;  Surgeon: Mal Misty, MD;  Location: [place] [facility];  Service: Vascular;  Laterality: Left;  with Dacron patch angioplasty  . Carotid endarterectomy  01/08/12    Left  . Fracture surgery  2005-12-26    Broken Back   Social History   Social History  . Marital Status: Widowed    Spouse Name: N/A  . Number of Children: 1   Social History Main Topics  . Smoking status: Former Smoker -- 0.50 packs/day for 40 years    Types: Cigarettes  . Smokeless tobacco: Never Used     Comment: Quit >2 months ago  . Alcohol Use: No  . Drug Use: No   Social History Narrative   She has 1 child.  She is on disability.   Current Outpatient Prescriptions on File Prior to Visit  Medication Sig Dispense Refill  . aspirin 325 MG tablet Take 325 mg by mouth daily.    . Black Cohosh 540 MG CAPS Take 540 mg by mouth 2 (two) times daily.    . clonazePAM (KLONOPIN) 1 MG tablet Take 2 mg by mouth 2 (two) times daily. Takes 1 tablet BID and 2 tablets at Bedtime.    . clopidogrel (PLAVIX) 75 MG tablet Take 1 tablet (75 mg total) by mouth daily. 30 tablet 11  . HYDROcodone-acetaminophen (NORCO/VICODIN) 5-325 MG per tablet Take 1-2  tablets by mouth every 4 (four) hours as needed for moderate pain. 20 tablet 0  . hydrocortisone  (CORTEF) 10 MG tablet Take 1 tablet (10 mg total) by mouth at bedtime. 30 tablet 1  . hydrocortisone (CORTEF) 5 MG tablet Take 1 tablet (5 mg total) by mouth 2 (two) times daily. (Patient taking differently: Take 15 mg by mouth daily. )    . QUEtiapine (SEROQUEL XR) 300 MG 24 hr tablet Take 600 mg by mouth at bedtime.    [name] QUEtiapine (SEROQUEL) 400 MG tablet Take 400 mg by mouth at bedtime.     . risperiDONE (RISPERDAL) 2 MG tablet Take 2 mg by mouth 3 (three) times daily.     . traMADol (ULTRAM) 50 MG tablet Take 1 tablet (50 mg total) by mouth every 12 (twelve) hours as needed for severe pain. 20 tablet 0   No current facility-administered medications on file prior to visit.   No Known Allergies Family History  Problem Relation Age of Onset  . Coronary artery disease Father   . Prostate cancer Father     not sure  . Pancreatic cancer Father     not sure  . Cancer Father     Pancreatic and  Prostate  . Heart disease Father     Before age 38 and BPG  . Hyperlipidemia Father   . Heart attack Father   . Hypertension Mother   . Diabetes Sister   . Colon cancer Neg Hx    PE: BP 100/60 mmHg  Pulse 90  Temp(Src) 97.9 F (36.6 C) (Oral)  Resp 12  Ht _0  (1.6 m)  Wt 113 lb (51.256 kg)  BMI 20.02 kg/m2  SpO2 98% Wt Readings from Last 3 Encounters:  09/11/15 113 lb (51.256 kg)  07/31/15 112 lb (50.803 kg)  07/26/15 109 lb 12.8 oz (49.805 kg)   Constitutional: Normal weight, in NAD Eyes: PERRLA, EOMI, no exophthalmos ENT: moist mucous membranes, no thyromegaly, no cervical lymphadenopathy Cardiovascular: RRR, No MRG Respiratory: CTA B Gastrointestinal: abdomen soft, NT, ND, BS+ Musculoskeletal: no deformities, strength intact in all 4 Skin: moist, warm, no rashes; no dark discoloration of skin Neurological: Mild tremor with outstretched hands, DTR normal in all 4  ASSESSMENT: 1. Suspected Adrenal insufficiency  PLAN:  1. Suspected Adrenal insufficiency (AI) - I  reviewed patient's chart, and her previous cortisol and cosyntropin stimulation tests. She has had low random and a.m. cortisol levels before 06/15/2015, however, normal stimulation tests. A normal stimulation test trumps a low a.m. cortisol. However, the stimulation test from 06/15/2015 was very abnormal, and I do not have a good explanation for this. Pt describes that she did not feel a difference in her symptoms after she started hydrocortisone, while patients with true adrenal insufficiency would feel a huge positive difference. My suspicion is that her previous episodes of low blood pressure, lightheadedness and orthostasis were actually due to the high doses of her psychotropic medications: Seroquel, Risperdal, benzodiazepines. She does feel better now from the blood pressure point of view, but she feels that this did not happen after starting hydrocortisone but after reducing the doses of these medications. Also, per my review of the literature, Seroquel, even at lower doses, can suppress cortisol and ACTH, so I'm hoping that reducing the dose of the medication might allow the endogenous cortisol to recover. - As of now, however, I will treat her as if she had adrenal insufficiency. We discussed to repeat the stimulation test in 2 weeks -  If the stimulation test is normal, we may be able to taper hydrocortisone to off or maybe use it only in stress situations. - We will plan to check this at 8 am >> will return for this. I advised her not to take her hydrocortisone the afternoon before the test and also in the morning of the test. However, she is to bring her hydrocortisone with her and take the first dose of the day right after the labs are drawn. If the stimulation test is abnormal and pt turns out to have AI, we will need to check several tests to try to establish the etiology. Only primary adrenal insufficiency is called Addison's disease, but we we do not have a diagnosis as of now (this was one of  her questions). - we discussed about proper replacement with Hydrocortisone: a bid dose is likely best >> 10 mg in am and 5 mg in pm, best ~3 pm, but no later than 6 PM. She is now taking 5 mg in a.m. and 10 mg at bedtime, which is not ideal, since it exactly opposite the circadian secretion of cortisol. Subsequently, she cannot sleep at night and has to take a sleeping pill.  - Discussed about and given written instructions for sick days rules:  If you cannot keep anything down, including your hydrocortisone medication, please go to the emergency room or your primary care physician office to get steroids injected in the muscle or vein. Alternatively, you can inject 100 mg hydrocortisone in the muscle at home.  If you have a fever (more than 100 Fahrenheit) or gastroenteritis with nausea/vomiting and diarrhea, please double the dose of your hydrocortisone for the duration of the fever or the gastroenteritis.  Do not run out of your hydrocortisone medication. - She does not think that she can give herself an injection of hydrocortisone, therefore, I directed her to the nearest emergency room, or urgent care or even PCPs office if she cannot take tablets of hydrocortisone by mouth - advised for [place] bracelet mentioning "adrenal insufficiency". - Return in about 3 months (around 12/12/2015).  - time spent with the patient and her daughter: 1 hour, of which >50% was spent in obtaining information about her symptoms, reviewing her previous labs, evaluations, and treatments, counseling her about her condition (please see the discussed topics above), and developing a plan to further investigate it; she and her daughter had a number of questions which I addressed.  Orders Placed This Encounter  Procedures  . Cortisol  . ACTH  . Cortisol  . Cortisol   Component     Latest Ref Rng 09/26/2015 09/26/2015 09/26/2015        11:44 AM 12:23 PM 12:54 PM  Cortisol, Plasma      31.1 31.8 32.7  C206  ACTH     6 - 50 pg/mL 12    Very unusual cosyntropin stimulation test, with high cortisol in the morning (in the setting of normal ACTH), and minimum change over the next hour. It is difficult to draw conclusion from the above values, therefore, I plan to repeat her cosyntropin stimulation test when she comes back in 3 months. For now, continue hydrocortisone as discussed.

## 2015-09-26 ENCOUNTER — Other Ambulatory Visit (INDEPENDENT_AMBULATORY_CARE_PROVIDER_SITE_OTHER): Payer: Medicare Other

## 2015-09-26 ENCOUNTER — Other Ambulatory Visit (INDEPENDENT_AMBULATORY_CARE_PROVIDER_SITE_OTHER): Payer: Medicare Other | Admitting: *Deleted

## 2015-09-26 ENCOUNTER — Other Ambulatory Visit: Payer: Self-pay | Admitting: *Deleted

## 2015-09-26 DIAGNOSIS — E2740 Unspecified adrenocortical insufficiency: Secondary | ICD-10-CM

## 2015-09-26 LAB — CORTISOL
CORTISOL PLASMA: 31.1 ug/dL
Cortisol, Plasma: 31.8 ug/dL
Cortisol, Plasma: 32.7 ug/dL

## 2015-09-26 MED ORDER — COSYNTROPIN 0.25 MG IJ SOLR
0.2500 mg | Freq: Once | INTRAMUSCULAR | Status: AC
  Administered 2015-09-26: 0.25 mg via INTRAMUSCULAR

## 2015-09-28 LAB — ACTH: C206 ACTH: 12 pg/mL (ref 6–50)

## 2015-10-01 ENCOUNTER — Other Ambulatory Visit: Payer: Medicare Other

## 2015-10-03 ENCOUNTER — Encounter: Payer: Self-pay | Admitting: *Deleted

## 2015-10-04 ENCOUNTER — Ambulatory Visit: Payer: Medicare Other | Admitting: Neurology

## 2015-11-09 ENCOUNTER — Ambulatory Visit: Payer: Medicare Other | Admitting: Neurology

## 2015-11-16 ENCOUNTER — Telehealth: Payer: Self-pay | Admitting: Behavioral Health

## 2015-11-16 NOTE — Telephone Encounter (Signed)
Assessed the following gaps in care: Mammogram Pap (past hx of hysterectomy)  Colonoscopy Flu  Unable to reach patient at this time to schedule CPE. Left message for patient to return call when available.

## 2015-12-13 ENCOUNTER — Ambulatory Visit (INDEPENDENT_AMBULATORY_CARE_PROVIDER_SITE_OTHER): Payer: Medicare Other | Admitting: Internal Medicine

## 2015-12-13 ENCOUNTER — Other Ambulatory Visit (INDEPENDENT_AMBULATORY_CARE_PROVIDER_SITE_OTHER): Payer: Medicare Other | Admitting: *Deleted

## 2015-12-13 ENCOUNTER — Encounter: Payer: Self-pay | Admitting: Internal Medicine

## 2015-12-13 VITALS — BP 100/60 | HR 100 | Temp 97.9°F | Resp 12 | Wt 115.0 lb

## 2015-12-13 DIAGNOSIS — E2740 Unspecified adrenocortical insufficiency: Secondary | ICD-10-CM

## 2015-12-13 LAB — CORTISOL
CORTISOL PLASMA: 14.5 ug/dL
Cortisol, Plasma: 21.9 ug/dL
Cortisol, Plasma: 28.2 ug/dL

## 2015-12-13 MED ORDER — COSYNTROPIN 0.25 MG IJ SOLR
0.2500 mg | Freq: Once | INTRAMUSCULAR | Status: AC
  Administered 2015-12-13: 0.25 mg via INTRAMUSCULAR

## 2015-12-13 NOTE — Progress Notes (Signed)
Patient ID: Caroline [name], female   DOB: June 21, 1951, 65 y.o.   MRN: [id number]   HPI  Caroline [name] is a 65 y.o.-year-old female, returning for follow-up for suspected adrenal insufficiency. She is here with her daughter who offers part of the history. Last visit 3 months ago.  Reviewed and addended history: Pt. has a history of orthostasis, lightheadedness, hypotension x 1 year (85/65), fell (fractured pelvic bone) -status post admission in 06/2015. At the time of her last admission, she had a low a.m. cortisol and 2 cosyntropin stimulation test performed, the first being normal, while the second was completely abnormal. They were done one day apart.  Component     Latest Ref Rng 06/12/2015 06/14/2015 06/15/2015  Cortisol, Base       1.4 1.8  Cortisol, 30 Min       17.7 1.9  Cortisol, 60 Min       28.2 2.0  Cortisol, Plasma      7.9  1.8   Of note, she had low cortisol levels in the past, with another normal cosyntropin stimulation test on 05/01/2008: Component     Latest Ref Rng 04/30/2008 05/01/2008 04/02/2009  Cortisol, Base       9.7 . . .   Cortisol, 30 Min     > 20  25.9   Cortisol, 60 Min     > 20  29.5   Cortisol - AM     4.3 - 22.4 ug/dL 2.8 (L)  3.8 (L)   All these tests were obtained while admitted to the hospital.   She was started on hydrocortisone in house.   The rest of the pituitary labs were normal, except a higher FSH: Component     Latest Ref Rng 04/02/2009 04/23/2012          2:20 PM  Estradiol       14.6  TSH     0.350 - 4.500 uIU/mL  2.731  Cortisol - AM     4.3 - 22.4 ug/dL 3.8 (L)   Free T4     0.80 - 1.80 ng/dL  0.88  FSH       169.2 (H)  LH       58.2   No h/o steroid use in the past. No h/o Depo-provera, Megace, po ketoconazole, phenytoin, rifampin, chronic fluconazole use. No h/o autoimmune diseases in pt or family mbs. No excess use of NSAIDs. On Plavix.  No h/o generalized infections or HIV. No IVDA. No h/o head injury or severe HA. +  h/o malignancy - BrCa, stage 1, no ChTx or RxTx. She has CAD - 5 AMIs- Dr Johnsie Cancel.   Also, it is to be noted that patient was on high doses of Seroquel, Risperdal and also on benzodiazepines. The doses were reduced before last visit.   The patient was started on hydrocortisone in the hospital, which we optimized at last visit >> 10 mg in a.m. and 5 mg in p.m.  She started to feel much better after reduction of her psychotropic medication, but did not feel a big difference after starting hydrocortisone.  At last visit, we checked another stim test, with very unusual results - high cortisol in the morning which did not stimulate 1 hour after cosyntropin. ACTH normal: Component     Latest Ref Rng 09/26/2015 09/26/2015 09/26/2015        11:44 AM 12:23 PM 12:54 PM  Cortisol, Plasma      31.1 31.8 32.7  C206 ACTH  6 - 50 pg/mL 12     Due to the unusual results, we opted to continue her on hydrocortisone and recheck her stim test at this visit.  Pt mentions: - + cold intolerance - + mm aches - no weight loss/gain - resolved fatigue - + Poor sleep (but takes 2nd HC dose late) - no nausea - no vomiting - no abdominal pain - no mm aches - no palpitations - no dizziness - no HAs - no syncopal episodes  She has a h/o hyponatremia, no hyperkalemia.  She did not stop the [employer] (tried to but felt poorly >> restarted).  ROS: Constitutional: See history of present illness Eyes: no blurry vision, no xerophthalmia ENT: no sore throat, no nodules palpated in throat, no dysphagia/no odynophagia, no hoarseness Cardiovascular: no CP/SOB/palpitations/leg swelling Respiratory: no cough/SOB Gastrointestinal: no N/V/D/C/ + heartburn Musculoskeletal: + muscle/no joint aches Skin: no rashes Neurological: no tremors/numbness/tingling/dizziness  I reviewed pt's medications, allergies, PMH, social hx, family hx, and changes were documented in the history of present illness. Otherwise,  unchanged from my initial visit note.  Past Medical History  Diagnosis Date  . GERD (gastroesophageal reflux disease)   . Dyslipidemia   . CAD (coronary artery disease)     [facility]  Diagonal instent restenosis treated 10/2005  . Myocardial infarction [facility])     3 stents 1st 2004, has had several  . Heart murmur     states years ago  . Shortness of breath     with exertion  . Bright disease   . Cancer [facility])     right breast 2001-02-03  . Anxiety   . Arthritis     back  . CHF (congestive heart failure) ([place])     states she was told se had this in past by MD  . Angina   . Hypertension     states a long time ago-states she never took medication  . Depression     02/03/02 had shock treatment due to depresson, spouse passed away 2000-02-04  . Carotid artery occlusion   . Atrial fibrillation ([place])   . Grave's disease     pt denies   Past Surgical History  Procedure Laterality Date  . Breast lumpectomy      right breast   . Coronary angioplasty with stent placement    . Joint replacement  February 03, 2010    Left total hip replacement  . Abdominal hysterectomy      partial  . Mouth surgery    . Endarterectomy  01/08/2012    Procedure: ENDARTERECTOMY CAROTID;  Surgeon: Mal Misty, MD;  Location: [place];  Service: Vascular;  Laterality: Left;  with Dacron patch angioplasty  . Carotid endarterectomy  01/08/12    Left  . Fracture surgery  02/03/06    Broken Back   Social History   Social History  . Marital Status: Widowed    Spouse Name: N/A  . Number of Children: 1   Social History Main Topics  . Smoking status: Former Smoker -- 0.50 packs/day for 40 years    Types: Cigarettes  . Smokeless tobacco: Never Used     Comment: Quit >2 months ago  . Alcohol Use: No  . Drug Use: No   Social History Narrative   She has 1 child.  She is on disability.   Current Outpatient Prescriptions on File Prior to Visit  Medication Sig Dispense Refill  . aspirin 325 MG tablet Take 325 mg by mouth daily.     [name]  Black Cohosh 540 MG CAPS Take 540 mg by mouth 2 (two) times daily.    . clonazePAM (KLONOPIN) 1 MG tablet Take 2 mg by mouth 2 (two) times daily. Takes 1 tablet BID and 2 tablets at Bedtime.    . clopidogrel (PLAVIX) 75 MG tablet Take 1 tablet (75 mg total) by mouth daily. 30 tablet 11  . HYDROcodone-acetaminophen (NORCO/VICODIN) 5-325 MG per tablet Take 1-2 tablets by mouth every 4 (four) hours as needed for moderate pain. 20 tablet 0  . hydrocortisone (CORTEF) 10 MG tablet Take 1 tablet in am 30 tablet 1  . hydrocortisone (CORTEF) 5 MG tablet Take 1 tablet in pm, no later than 6 pm    . QUEtiapine (SEROQUEL XR) 300 MG 24 hr tablet Take 600 mg by mouth at bedtime.    [name] QUEtiapine (SEROQUEL) 400 MG tablet Take 400 mg by mouth at bedtime.     . risperiDONE (RISPERDAL) 2 MG tablet Take 2 mg by mouth 3 (three) times daily.     . traMADol (ULTRAM) 50 MG tablet Take 1 tablet (50 mg total) by mouth every 12 (twelve) hours as needed for severe pain. 20 tablet 0   No current facility-administered medications on file prior to visit.   No Known Allergies Family History  Problem Relation Age of Onset  . Coronary artery disease Father   . Prostate cancer Father     not sure  . Pancreatic cancer Father     not sure  . Cancer Father     Pancreatic and  Prostate  . Heart disease Father     Before age 5 and BPG  . Hyperlipidemia Father   . Heart attack Father   . Hypertension Mother   . Diabetes Sister   . Colon cancer Neg Hx    PE: BP 100/60 mmHg  Pulse 100  Temp(Src) 97.9 F (36.6 C) (Oral)  Resp 12  Wt 115 lb (52.164 kg)  SpO2 96% Body mass index is 20.38 kg/(m^2). Wt Readings from Last 3 Encounters:  12/13/15 115 lb (52.164 kg)  09/11/15 113 lb (51.256 kg)  07/31/15 112 lb (50.803 kg)   Constitutional: Normal weight, in NAD Eyes: PERRLA, EOMI, no exophthalmos ENT: moist mucous membranes, no thyromegaly, no cervical lymphadenopathy Cardiovascular: RRR, No MRG Respiratory: CTA  B Gastrointestinal: abdomen soft, NT, ND, BS+ Musculoskeletal: no deformities, strength intact in all 4 Skin: moist, warm, no rashes Neurological: no tremor with outstretched hands, DTR normal in all 4  ASSESSMENT: 1. Suspected Adrenal insufficiency  PLAN:  1. Suspected Adrenal insufficiency (AI) - I reviewed patient's chart, and her previous cortisol and cosyntropin stimulation tests. She has had low random and a.m. cortisol levels before 06/15/2015, however, normal stimulation tests. A normal stimulation test trumps a low a.m. cortisol. However, the stimulation test from 06/15/2015 was very abnormal, and I do not have a good explanation for this. Pt describes that she did not feel a difference in her symptoms after she started hydrocortisone, while patients with true adrenal insufficiency would feel a huge positive difference. My suspicion is that her previous episodes of low blood pressure, lightheadedness and orthostasis were actually due to the high doses of her psychotropic medications: Seroquel, Risperdal, benzodiazepines. She does feel better now from the blood pressure point of view, but she feels that this did not happen after starting hydrocortisone but after reducing the doses of these medications. Also, Seroquel, even at lower doses, can suppress cortisol and ACTH, so hopefully reducing the dose  of the medication might allow the endogenous cortisol to recover. - As of now, especially in the setting of the last unusual cosyntropin stimulation test however, we are treating her as if she had adrenal insufficiency. We will need to repeat the stim test now that she is off her psychotropic medications for several months. - If the stimulation test is normal, we may be able to taper hydrocortisone to off or maybe use it only in stress situations. - I ask her to stop at the lab for a stimulation test (she cannot return in am). Last HC dose was at 7 pm yesterday, now 2 pm.  - We'll continue 10 mg  of hydrocortisone a.m. and 5 mg in the p.m >> advised her to take the second day no later than 6 pm to allow her to slepp well. She feels good on this dose, without significant weight gain or weight loss, dizziness, hypertension, headaches, nausea, vomiting. - Discussed again about and given written instructions for sick days rules:  If you cannot keep anything down, including your hydrocortisone medication, please go to the emergency room or your primary care physician office to get steroids injected in the muscle or vein. Alternatively, you can inject 100 mg hydrocortisone in the muscle at home.  If you have a fever (more than 100 Fahrenheit) or gastroenteritis with nausea/vomiting and diarrhea, please double the dose of your hydrocortisone for the duration of the fever or the gastroenteritis.  Do not run out of your hydrocortisone medication. - She does not think that she can give herself an injection of hydrocortisone, therefore, she knows to go to the nearest emergency room, urgent care or PCPs office if she cannot take tablets of hydrocortisone by mouth - advised for [facility] bracelet mentioning "adrenal insufficiency". - Return in about 6 months (around 06/11/2016).   Orders Placed This Encounter  Procedures  . Cortisol    Please draw at time 0 min  . Cortisol    Please draw at time 30 min  . Cortisol    Please draw at time 60 min    Component     Latest Ref Rng 12/13/2015 12/13/2015 12/13/2015         2:07 PM  2:37 PM  3:03 PM  Cortisol, Plasma      14.5 21.9 28.2  Normal stimulation test. At this point, due to history, I will suggest that she decreases the dose of hydrocortisone to 5 mg in a.m., but be ready to increase the dose back to 15 mg a day if she starts feeling poorly, with dizziness, orthostasis, nausea, headaches. She should also have it at hand in case of stress. At next visit, will check a cortisol level and if normal we will stop the hydrocortisone.

## 2015-12-13 NOTE — Patient Instructions (Signed)
Please continue the Hydrocortisone: 10 mg in am and 5 mg in pm, no later than 6 pm.  Sick days rules:  If you cannot keep anything down, including your hydrocortisone medication, please go to the emergency room or your primary care physician office to get steroids injected in the muscle or vein. Alternatively, you can inject 100 mg hydrocortisone in the muscle at home.   If you have a fever (more than 100 Fahrenheit) or gastroenteritis with nausea/vomiting and diarrhea, please double the dose of your hydrocortisone for the duration of the fever or the gastroenteritis.   Do not run out of your hydrocortisone medication.  Please stop at the lab for the stimulation test.  Please come back for a follow-up appointment in 6 months.

## 2015-12-21 ENCOUNTER — Encounter: Payer: Self-pay | Admitting: *Deleted

## 2016-01-03 ENCOUNTER — Telehealth: Payer: Self-pay | Admitting: Cardiovascular Disease

## 2016-01-03 ENCOUNTER — Encounter: Payer: Self-pay | Admitting: Cardiovascular Disease

## 2016-01-03 ENCOUNTER — Telehealth: Payer: Self-pay | Admitting: Physician Assistant

## 2016-01-03 NOTE — Telephone Encounter (Signed)
New Message  This message is to inform you that we have made 3 consecutive attempts to contact the patient since 10/03/2016. We have also mailed a letter to the patient to inform them to call in and schedule. Although we were unsuccessful in these attempts we wanted you to be aware of our efforts. Will remove the patient from our recall list at this time    Caroline [name] [facility]

## 2016-01-03 NOTE — Telephone Encounter (Signed)
LM for pt to call and schedule flu shot or update records & to schedule CPE (last unknown)

## 2016-01-10 ENCOUNTER — Ambulatory Visit: Payer: Medicare Other | Admitting: Neurology

## 2016-01-21 ENCOUNTER — Encounter: Payer: Self-pay | Admitting: Neurology

## 2016-02-06 ENCOUNTER — Ambulatory Visit: Payer: Medicare Other | Admitting: Neurology

## 2016-02-08 ENCOUNTER — Ambulatory Visit: Payer: Medicare Other | Admitting: Neurology

## 2016-03-19 ENCOUNTER — Other Ambulatory Visit: Payer: Self-pay | Admitting: Cardiovascular Disease

## 2016-03-19 NOTE — Telephone Encounter (Signed)
Rx request sent to pharmacy.  

## 2016-04-15 ENCOUNTER — Other Ambulatory Visit: Payer: Self-pay | Admitting: Cardiovascular Disease

## 2016-04-17 ENCOUNTER — Encounter: Payer: Self-pay | Admitting: Cardiology

## 2016-04-30 ENCOUNTER — Ambulatory Visit: Payer: Medicare Other | Admitting: Cardiology

## 2016-05-07 ENCOUNTER — Ambulatory Visit: Payer: Medicare Other | Admitting: Physician Assistant

## 2016-05-22 ENCOUNTER — Telehealth: Payer: Self-pay | Admitting: Cardiovascular Disease

## 2016-05-22 NOTE — Telephone Encounter (Signed)
Called patient back about making an appointment with Dr. Johnsie Cancel, since she is due for a 6 month follow-up. Patient has an appointment with Almyra Deforest PA next week, and he will order carotid duplex for patient at that time if needed.

## 2016-05-22 NOTE — Telephone Encounter (Signed)
New Message  Pt calling to speak w/ RN- stated that she is to have a carotid ultrasound- no order/recall in syst. Please call back and discuss.

## 2016-05-30 ENCOUNTER — Ambulatory Visit: Payer: Medicare Other | Admitting: Physician Assistant

## 2016-06-03 ENCOUNTER — Encounter: Payer: Self-pay | Admitting: Physician Assistant

## 2016-06-03 ENCOUNTER — Ambulatory Visit (INDEPENDENT_AMBULATORY_CARE_PROVIDER_SITE_OTHER): Payer: Medicare Other | Admitting: Physician Assistant

## 2016-06-03 VITALS — BP 116/62 | HR 91 | Resp 16 | Ht 63.0 in | Wt 128.0 lb

## 2016-06-03 DIAGNOSIS — M47819 Spondylosis without myelopathy or radiculopathy, site unspecified: Secondary | ICD-10-CM | POA: Diagnosis not present

## 2016-06-03 DIAGNOSIS — M4310 Spondylolisthesis, site unspecified: Secondary | ICD-10-CM | POA: Diagnosis not present

## 2016-06-03 MED ORDER — HYDROCODONE-ACETAMINOPHEN 5-325 MG PO TABS: 1.0000 | ORAL_TABLET | Freq: Three times a day (TID) | ORAL | 0 refills | Status: DC | PRN

## 2016-06-03 NOTE — Patient Instructions (Signed)
Please limit heavy lifting. Rest when possible. Take the pain medication as directed.  You will be contacted for assessment by an Orthopedist.  Follow-up with me in 1 month. Return sooner if needed.

## 2016-06-03 NOTE — Assessment & Plan Note (Signed)
Cervical, thoracic and lumbar. Chronic arthritis. Discussed stretches and water aerobics. Will begin Norco as directed for chronic pain. Referral placed to Orthopedics for ongoing assessment and interventions to help with pain. She has been informed we are not a chronic pain management practice. If chronic narcotics are needed, she will need referral to [facility]. Follow-up 1 month.

## 2016-06-03 NOTE — Progress Notes (Signed)
Patient presents to clinic today to discuss medication for chronic back pain. Patient with history of cervical, thoracic and lumbar degenerative disc disease with documented retrolisthesis of L2 on L3. Patient endorses chronic aching pain,mainly of lower back that does not radiate. Notes pain with household chores like vacuuming or cleaning. Ambulates well but notes aching pain with long-distance ambulation. Pain is alleviated with lying down at night. Previously on Tramadol PRN for flares but noted non-therapeutic. Would like to discuss options for her pain.   Past Medical History:  Diagnosis Date  . Angina   . Anxiety   . Arthritis    back  . Atrial fibrillation ([place])   . Bright disease   . CAD (coronary artery disease)    [facility]  Diagonal instent restenosis treated 10/2005  . Cancer [facility])    right breast 02-28-01  . Carotid artery occlusion   . CHF (congestive heart failure) ([place])    states she was told se had this in past by MD  . Depression    28-Feb-2002 had shock treatment due to depresson, spouse passed away Feb 29, 2000  . Dyslipidemia   . GERD (gastroesophageal reflux disease)   . Grave's disease    pt denies  . Heart murmur    states years ago  . Hypertension    states a long time ago-states she never took medication  . Myocardial infarction [facility])    3 stents 1st 2004, has had several  . Shortness of breath    with exertion    Current Outpatient Prescriptions on File Prior to Visit  Medication Sig Dispense Refill  . QUEtiapine (SEROQUEL XR) 300 MG 24 hr tablet Take 600 mg by mouth at bedtime.    [name] QUEtiapine (SEROQUEL) 400 MG tablet Take 400 mg by mouth at bedtime.     . risperiDONE (RISPERDAL) 2 MG tablet Take 2 mg by mouth 3 (three) times daily.     [name] aspirin 325 MG tablet Take 325 mg by mouth daily.    . Black Cohosh 540 MG CAPS Take 540 mg by mouth 2 (two) times daily.    . clonazePAM (KLONOPIN) 1 MG tablet Take 2 mg by mouth 2 (two) times daily. Takes 1 tablet BID  and 2 tablets at Bedtime.    . clopidogrel (PLAVIX) 75 MG tablet TAKE 1 TABLET BY MOUTH DAILY. PLEASE SCHEDULE APPOINTMENT FOR REFILLS 30 tablet 2   No current facility-administered medications on file prior to visit.     No Known Allergies  Family History  Problem Relation Age of Onset  . Coronary artery disease Father   . Prostate cancer Father     not sure  . Pancreatic cancer Father     not sure  . Cancer Father     Pancreatic and  Prostate  . Heart disease Father     Before age 36 and BPG  . Hyperlipidemia Father   . Heart attack Father   . Hypertension Mother   . Diabetes Sister   . Colon cancer Neg Hx     Social History   Social History  . Marital status: Widowed    Spouse name: N/A  . Number of children: N/A  . Years of education: N/A   Social History Main Topics  . Smoking status: Former Smoker    Packs/day: 0.50    Years: 40.00    Types: Cigarettes  . Smokeless tobacco: Never Used     Comment: Quit >2 months ago  .  Alcohol use No  . Drug use: No  . Sexual activity: Not Asked   Other Topics Concern  . None   Social History Narrative   She has 1 child.  She is on disability.    Review of Systems - See HPI.  All other ROS are negative.  BP 116/62 (BP Location: Right Arm, Patient Position: Sitting, Cuff Size: Normal)   Pulse 91   Resp 16   Ht 5\' 3"  (1.6 m)   Wt 128 lb (58.1 kg)   SpO2 98%   BMI 22.67 kg/m   Physical Exam  Constitutional: She is oriented to person, place, and time and well-developed, well-nourished, and in no distress.  Cardiovascular: Normal rate, regular rhythm, normal heart sounds and intact distal pulses.   Pulmonary/Chest: Effort normal.  Musculoskeletal:       Cervical back: Normal.       Thoracic back: She exhibits pain. She exhibits no tenderness, no bony tenderness and no spasm.       Lumbar back: She exhibits tenderness and pain. She exhibits no bony tenderness and no spasm.  Neurological: She is alert and oriented  to person, place, and time.  Skin: Skin is warm and dry. No rash noted.  Psychiatric: Affect normal.  Vitals reviewed.   Assessment/Plan: Spondylarthrosis Cervical, thoracic and lumbar. Chronic arthritis. Discussed stretches and water aerobics. Will begin Norco as directed for chronic pain. Referral placed to Orthopedics for ongoing assessment and interventions to help with pain. She has been informed we are not a chronic pain management practice. If chronic narcotics are needed, she will need referral to [facility]. Follow-up 1 month.    [name], PA-C

## 2016-06-12 ENCOUNTER — Ambulatory Visit: Payer: Medicare Other | Admitting: Internal Medicine

## 2016-06-17 NOTE — Progress Notes (Signed)
Cardiology Office Note    Date:  06/18/2016   ID:  Caroline [name], DOB February 09, 1951, MRN [id number]  PCP:  [name], PA-C  Cardiologist:  Dr. Johnsie Cancel  CC: 6 month follow up.   History of Present Illness:  Caroline [name] is a 65 y.o. 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   Hospitalized in 06/2015 with dizziness and fall with pubic rami fracture. Postural cortisol low and started on steroids.  ACE/Beta blocker d/c.   She was last seen by Dr. Johnsie Cancel in 07/2015. He was very concerned that she wasn't taking her steroids as prescibed. He felt that the seroquel, risperdal, norco and klonopin are all contributing to her low sodium dizziness and postural symptoms. She was noted to have slurred speech and he took her off the seroquel and risperidal. She was referred to endo and neuro for closer follow up. Now followed by endo (Dr. Letta Median) who feels after further testing she may have never had adrenal insufficiency and that previous episodes of low blood pressure, lightheadedness and orthostasis were actually due to the high doses of her psychotropic medications: Seroquel, Risperdal, benzodiazepines, which she has been tapering off. She continues to follow with Dr. Cruzita Lederer.   Today she presents to clinic for follow up. She is alone today. She has not seen Dr. Letta Median back and is no longer taking hydrocortisone. Her psychiatrist put her back on Seroquel and Risperidal. She is feeling fine today. No CP or SOB, but sometimes she does have some SOB when lying flat. No LE edema or PND. No dizziness or syncope. No blood in stool or urine. No more postural dizziness. She occasionally gets palpitations when she is lying down. She primarily complains of  back pain. No formal exercise but wants to start but when walks up stairs she gets some SOB but no chest pain.     Past Medical History:  Diagnosis Date  . Angina   . Anxiety   . Arthritis    back  . Atrial fibrillation ([place])   . Bright disease   . CAD (coronary artery disease)    [facility]  Diagonal instent restenosis treated 10/2005  . Cancer [facility])    right breast 02/17/2001  . Carotid artery occlusion   . CHF (congestive heart failure) ([place])    states she was told se had this in past by MD  . Depression    2002/02/17 had shock treatment due to depresson, spouse passed away 02/18/2000  . Dyslipidemia   . GERD (gastroesophageal reflux disease)   . Grave's disease    pt denies  . Heart murmur    states years ago  . Hypertension    states a long time ago-states she never took medication  . Myocardial infarction [facility])    3 stents 1st 2004, has had several  . Shortness of breath    with exertion    Past Surgical History:  Procedure Laterality Date  . ABDOMINAL HYSTERECTOMY     partial  . BREAST LUMPECTOMY     right breast   . CAROTID ENDARTERECTOMY  01/08/12   Left  . CORONARY ANGIOPLASTY WITH STENT PLACEMENT    . ENDARTERECTOMY  01/08/2012   Procedure: ENDARTERECTOMY CAROTID;  Surgeon: Mal Misty, MD;  Location: [place];  Service: Vascular;  Laterality: Left;  with Dacron patch angioplasty  . FRACTURE SURGERY  2007   Broken Back  . JOINT REPLACEMENT  2011   Left total hip replacement  . MOUTH SURGERY      Current Medications: Outpatient Medications Prior to Visit  Medication Sig Dispense Refill  . aspirin 325 MG tablet Take 325 mg by mouth daily.    . Black Cohosh 540 MG CAPS Take 540 mg by mouth 2 (two) times daily.    . clonazePAM (KLONOPIN) 1 MG tablet Take 2 mg by mouth 2 (two) times daily. Takes 1 tablet BID and 2 tablets at Bedtime.    . clopidogrel (PLAVIX) 75 MG tablet TAKE 1 TABLET BY MOUTH DAILY.  30 tablet 2  . HYDROcodone-acetaminophen (NORCO/VICODIN) 5-325 MG  tablet Take 1 tablet by mouth every 8 (eight) hours as needed for moderate pain. 60 tablet 0  . risperiDONE (RISPERDAL) 2 MG tablet Take 2 mg by mouth 3 (three) times daily.     . QUEtiapine (SEROQUEL XR) 300 MG 24 hr tablet Take 600 mg by mouth at bedtime.    [name] QUEtiapine (SEROQUEL) 400 MG tablet Take 400 mg by mouth at bedtime.      No facility-administered medications prior to visit.      Allergies:   Review of patient's allergies indicates no known allergies.   Social History   Social History  . Marital status: Widowed    Spouse name: N/A  . Number of children: N/A  . Years of education: N/A   Social History Main Topics  . Smoking status: Former Smoker    Packs/day: 0.50    Years: 40.00    Types: Cigarettes  . Smokeless tobacco: Never Used     Comment: Quit >2 months ago  . Alcohol use No  . Drug use: No  . Sexual activity: Not Asked   Other Topics Concern  . None   Social History Narrative   She has 1 child.  She is on disability.     Family History:  The patient's family history includes Cancer in her father; Coronary artery disease in her father; Diabetes in her sister; Heart attack in her father; Heart disease in her father; Hyperlipidemia in her father; Hypertension in her mother; Pancreatic cancer in her father; Prostate cancer in her father.     ROS:   Please see the history of present illness.    ROS All other systems reviewed and are negative.   PHYSICAL EXAM:   VS:  BP 100/62   Pulse 92   Ht 5\' 3"  (1.6 m)   Wt 128 lb 12.8 oz (58.4 kg)   BMI 22.82 kg/m    GEN: Well nourished, well developed, in no acute distress  HEENT: normal  Neck: no JVD, carotid bruits, or masses Cardiac: RRR; no murmurs, rubs, or gallops,no edema  Respiratory:  clear to auscultation bilaterally, normal work of breathing GI: soft, nontender, nondistended, + BS MS: no deformity or atrophy  Skin: warm and dry, no rash Neuro:  Alert and Oriented x 3, Strength and sensation are  intact Psych: euthymic mood, full affect  Wt Readings from Last 3 Encounters:  06/18/16 128 lb 12.8 oz (58.4 kg)  06/03/16 128 lb (58.1 kg)  12/13/15 115 lb (52.2 kg)      Studies/Labs Reviewed:   EKG:  EKG is ordered today.  The ekg ordered today demonstrates NSR, HR 93. LAD,  prolonged QT: 386/479  Recent Labs: No results found for requested labs within last 8760 hours.   Lipid Panel    Component Value Date/Time   CHOL 264 (H) 04/23/2012 1420   TRIG 126 04/23/2012 1420   HDL 67 04/23/2012 1420   CHOLHDL 3.9 04/23/2012 1420   VLDL 25 04/23/2012 1420   LDLCALC 172 (H) 04/23/2012 1420    Additional studies/ records that were reviewed today include:  - LHC (6/10): Mid Diag 90% ISR, prox RCA 20%, mid RCA 40%, EF 35% global HK with apical AK >>> PCI: Cutting balloon POBA of Diag ISR  - Echo (5/10): EF 40%  - Echo (06/11/15) ): EF 30-35%, AS dyskinesis, inf HK, mild AI, mild MR  - Nuclear (10/14): Lg anterior, apical and inf-apical infarct, no ischemia, EF 31% - similar to study from 2010; Intermediate Risk >>> Med Rx  - Carotid US (7/16): RICA occluded, L CEA patent    ASSESSMENT & PLAN:   Ischemic cardiomyopathy:  As noted, her BP is too low to tolerate any medications.  She does have some orthopnea. Will check a ECHO. EF was 30-35% back in 06/2015. If EF remains <35%, we may need to refer to EP to discuss ICD placement. She currently appears euvolemic on exam, but with orthopnea I will check BNP. If elevated, I will prescribe lasix 20mg  PRN for SOB.    Chronic systolic heart failure:  She is NYHA 2b-3.  Volume is stable.    CAD:  No angina.  Continue ASA, Plavix.  No BB due to hypotension. Unclear why she isn't on a statin. Patient cannot tell me and daughter not here.   Carotid artery disease: known RICA occlusion left CEA patent but CNS flow dependant on circle of [place] and not ideal to have low BP. FU with Dr. Kellie Simmering as planned.   CKD: creat baseline is 1.2-1.4. Will  check BMET today in case we need to Rx Lasix  Prolonged QT: QT/QTc: 386/479 today. This actually appears better than previous (620/724 a year ago). Likely 2/2 to her psych meds being lowered. She is on Risperdal and Seroquel which she needs per patient and psychiatry. We will continue to monitor these.    Medication Adjustments/Labs and Tests Ordered: Current medicines are reviewed at length with the patient today.  Concerns regarding medicines are outlined above.  Medication changes, Labs and Tests ordered today are listed in the Patient Instructions below. Patient Instructions  Medication Instructions:  Your physician recommends that you continue on your current medications as directed. Please refer to the Current Medication list given to you today.   Labwork: TODAY:  BMP & BNP  Testing/Procedures: Your physician has requested that you have an echocardiogram. Echocardiography is a painless test that uses sound waves to create images of your heart. It provides your doctor with information about the size and shape of your heart and how well your heart's chambers and valves are working. This procedure takes approximately one hour. There are no restrictions for this procedure.    Follow-Up: Your physician recommends that you schedule a follow-up appointment in: 3 MONTHS WITH DR. Johnsie Cancel    Any Other Special Instructions Will Be Listed Below (If Applicable).  Dr. Kellie Simmering # [phone]   Echocardiogram An echocardiogram, or echocardiography, uses sound waves (ultrasound) to produce an image of your heart. The echocardiogram is simple, painless, obtained within a short period of time, and offers valuable information to your health care provider. The images from an echocardiogram can  provide information such as:  Evidence of coronary artery disease (CAD).  Heart size.  Heart muscle function.  Heart valve function.  Aneurysm detection.  Evidence of a past heart attack.  Fluid  buildup around the heart.  Heart muscle thickening.  Assess heart valve function. LET [facility] CARE PROVIDER KNOW ABOUT:  Any allergies you have.  All medicines you are taking, including vitamins, herbs, eye drops, creams, and over-the-counter medicines.  Previous problems you or members of your family have had with the use of anesthetics.  Any blood disorders you have.  Previous surgeries you have had.  Medical conditions you have.  Possibility of pregnancy, if this applies. BEFORE THE PROCEDURE  No special preparation is needed. Eat and drink normally.  PROCEDURE   In order to produce an image of your heart, gel will be applied to your chest and a wand-like tool (transducer) will be moved over your chest. The gel will help transmit the sound waves from the transducer. The sound waves will harmlessly bounce off your heart to allow the heart images to be captured in real-time motion. These images will then be recorded.  You may need an IV to receive a medicine that improves the quality of the pictures. AFTER THE PROCEDURE You may return to your normal schedule including diet, activities, and medicines, unless your health care provider tells you otherwise.   This information is not intended to replace advice given to you by your health care provider. Make sure you discuss any questions you have with your health care provider.   Document Released: 10/17/2000 Document Revised: 11/10/2014 Document Reviewed: 06/27/2013 Elsevier Interactive Patient Education [employer].    If you need a refill on your cardiac medications before your next appointment, please call your pharmacy.       Signed, Angelena Form, PA-C  06/18/2016 2:23 PM    [facility] [address], [place], [place]  [address] Phone: [phone]; Fax: [phone]

## 2016-06-18 ENCOUNTER — Encounter: Payer: Self-pay | Admitting: Physician Assistant

## 2016-06-18 ENCOUNTER — Encounter (INDEPENDENT_AMBULATORY_CARE_PROVIDER_SITE_OTHER): Payer: Self-pay

## 2016-06-18 ENCOUNTER — Ambulatory Visit (INDEPENDENT_AMBULATORY_CARE_PROVIDER_SITE_OTHER): Payer: Medicare Other | Admitting: Physician Assistant

## 2016-06-18 VITALS — BP 100/62 | HR 92 | Ht 63.0 in | Wt 128.8 lb

## 2016-06-18 DIAGNOSIS — I779 Disorder of arteries and arterioles, unspecified: Secondary | ICD-10-CM | POA: Diagnosis not present

## 2016-06-18 DIAGNOSIS — I739 Peripheral vascular disease, unspecified: Secondary | ICD-10-CM

## 2016-06-18 DIAGNOSIS — I2510 Atherosclerotic heart disease of native coronary artery without angina pectoris: Secondary | ICD-10-CM | POA: Diagnosis not present

## 2016-06-18 DIAGNOSIS — I255 Ischemic cardiomyopathy: Secondary | ICD-10-CM | POA: Diagnosis not present

## 2016-06-18 DIAGNOSIS — I4581 Long QT syndrome: Secondary | ICD-10-CM

## 2016-06-18 DIAGNOSIS — I5022 Chronic systolic (congestive) heart failure: Secondary | ICD-10-CM

## 2016-06-18 DIAGNOSIS — R9431 Abnormal electrocardiogram [ECG] [EKG]: Secondary | ICD-10-CM

## 2016-06-18 DIAGNOSIS — N189 Chronic kidney disease, unspecified: Secondary | ICD-10-CM

## 2016-06-18 NOTE — Patient Instructions (Addendum)
Medication Instructions:  Your physician recommends that you continue on your current medications as directed. Please refer to the Current Medication list given to you today.   Labwork: TODAY:  BMP & BNP  Testing/Procedures: Your physician has requested that you have an echocardiogram. Echocardiography is a painless test that uses sound waves to create images of your heart. It provides your doctor with information about the size and shape of your heart and how well your heart's chambers and valves are working. This procedure takes approximately one hour. There are no restrictions for this procedure.    Follow-Up: Your physician recommends that you schedule a follow-up appointment in: 3 MONTHS WITH DR. Johnsie Cancel    Any Other Special Instructions Will Be Listed Below (If Applicable).  Dr. Kellie Simmering # [phone]   Echocardiogram An echocardiogram, or echocardiography, uses sound waves (ultrasound) to produce an image of your heart. The echocardiogram is simple, painless, obtained within a short period of time, and offers valuable information to your health care provider. The images from an echocardiogram can provide information such as:  Evidence of coronary artery disease (CAD).  Heart size.  Heart muscle function.  Heart valve function.  Aneurysm detection.  Evidence of a past heart attack.  Fluid buildup around the heart.  Heart muscle thickening.  Assess heart valve function. LET [facility] CARE PROVIDER KNOW ABOUT:  Any allergies you have.  All medicines you are taking, including vitamins, herbs, eye drops, creams, and over-the-counter medicines.  Previous problems you or members of your family have had with the use of anesthetics.  Any blood disorders you have.  Previous surgeries you have had.  Medical conditions you have.  Possibility of pregnancy, if this applies. BEFORE THE PROCEDURE  No special preparation is needed. Eat and drink normally.  PROCEDURE    In order to produce an image of your heart, gel will be applied to your chest and a wand-like tool (transducer) will be moved over your chest. The gel will help transmit the sound waves from the transducer. The sound waves will harmlessly bounce off your heart to allow the heart images to be captured in real-time motion. These images will then be recorded.  You may need an IV to receive a medicine that improves the quality of the pictures. AFTER THE PROCEDURE You may return to your normal schedule including diet, activities, and medicines, unless your health care provider tells you otherwise.   This information is not intended to replace advice given to you by your health care provider. Make sure you discuss any questions you have with your health care provider.   Document Released: 10/17/2000 Document Revised: 11/10/2014 Document Reviewed: 06/27/2013 Elsevier Interactive Patient Education [employer].    If you need a refill on your cardiac medications before your next appointment, please call your pharmacy.

## 2016-06-19 ENCOUNTER — Telehealth: Payer: Self-pay | Admitting: *Deleted

## 2016-06-19 DIAGNOSIS — I10 Essential (primary) hypertension: Secondary | ICD-10-CM

## 2016-06-19 LAB — BASIC METABOLIC PANEL
BUN: 21 mg/dL (ref 7–25)
CHLORIDE: 98 mmol/L (ref 98–110)
CO2: 26 mmol/L (ref 20–31)
Calcium: 9.4 mg/dL (ref 8.6–10.4)
Creat: 2.16 mg/dL — ABNORMAL HIGH (ref 0.50–0.99)
Glucose, Bld: 84 mg/dL (ref 65–99)
POTASSIUM: 4.5 mmol/L (ref 3.5–5.3)
Sodium: 134 mmol/L — ABNORMAL LOW (ref 135–146)

## 2016-06-19 LAB — BRAIN NATRIURETIC PEPTIDE: Brain Natriuretic Peptide: 275.8 pg/mL — ABNORMAL HIGH (ref <100)

## 2016-06-19 NOTE — Telephone Encounter (Signed)
Patient called and stated that the pharmacy did not receive the rx for lasix yesterday. Per her office visit with Angelena Form, PA: CKD: creat baseline is 1.2-1.4. Will check BMET today in case we need to Rx Lasix. She can be reached at [phone]. Thanks, MI

## 2016-06-19 NOTE — Telephone Encounter (Signed)
Called patient about her lab results. Patient stated that she sees Dr. Justin Mend at [facility]. Patient stated her symptoms have not improved. Patient complaining of SOB. Patient's BNP was 275.8, with BMET showing BUN 21 and Creatinine 2.16. Per Angelena Form PA-C office note " I will check BNP. If elevated, I will prescribe lasix 20mg  PRN for SOB. " Will forward to Angelena Form PA-C for advisement.

## 2016-06-20 MED ORDER — FUROSEMIDE 20 MG PO TABS: 20.0000 mg | ORAL_TABLET | Freq: Every day | ORAL | 1 refills | Status: DC | PRN

## 2016-06-20 NOTE — Telephone Encounter (Signed)
-----   Message from Eileen Stanford, PA-C sent at 06/19/2016  6:11 AM EDT ----- Kidney function much worse. Not on any meds that affect kidneys. Does she follow with a nephrologist?

## 2016-06-20 NOTE — Telephone Encounter (Signed)
Pt has been made aware of the importance of her getting an appt with her Nephrologist due to her worsened kidney function. Pt was made aware that we will call in Lasix 20 mg and was explained that if she didn't need it daily, do not take it daily, ONLY PRN FOR SOB. Pt will come back 06/25/16 on day of her echo, to repeat bmet. Pt agreeable with this plan and verbalized understanding.

## 2016-06-25 ENCOUNTER — Other Ambulatory Visit: Payer: Medicare Other | Admitting: *Deleted

## 2016-06-25 ENCOUNTER — Other Ambulatory Visit: Payer: Self-pay

## 2016-06-25 ENCOUNTER — Ambulatory Visit (HOSPITAL_COMMUNITY): Payer: Medicare Other | Attending: Cardiology

## 2016-06-25 DIAGNOSIS — I2510 Atherosclerotic heart disease of native coronary artery without angina pectoris: Secondary | ICD-10-CM | POA: Diagnosis not present

## 2016-06-25 DIAGNOSIS — E785 Hyperlipidemia, unspecified: Secondary | ICD-10-CM | POA: Insufficient documentation

## 2016-06-25 DIAGNOSIS — I509 Heart failure, unspecified: Secondary | ICD-10-CM | POA: Diagnosis not present

## 2016-06-25 DIAGNOSIS — Z87891 Personal history of nicotine dependence: Secondary | ICD-10-CM | POA: Insufficient documentation

## 2016-06-25 DIAGNOSIS — I351 Nonrheumatic aortic (valve) insufficiency: Secondary | ICD-10-CM | POA: Diagnosis not present

## 2016-06-25 DIAGNOSIS — C50911 Malignant neoplasm of unspecified site of right female breast: Secondary | ICD-10-CM | POA: Diagnosis not present

## 2016-06-25 DIAGNOSIS — Z8249 Family history of ischemic heart disease and other diseases of the circulatory system: Secondary | ICD-10-CM | POA: Insufficient documentation

## 2016-06-25 DIAGNOSIS — I110 Hypertensive heart disease with heart failure: Secondary | ICD-10-CM | POA: Insufficient documentation

## 2016-06-25 DIAGNOSIS — E0500 Thyrotoxicosis with diffuse goiter without thyrotoxic crisis or storm: Secondary | ICD-10-CM | POA: Insufficient documentation

## 2016-06-25 DIAGNOSIS — I340 Nonrheumatic mitral (valve) insufficiency: Secondary | ICD-10-CM | POA: Diagnosis not present

## 2016-06-25 DIAGNOSIS — I255 Ischemic cardiomyopathy: Secondary | ICD-10-CM | POA: Diagnosis not present

## 2016-06-25 DIAGNOSIS — I10 Essential (primary) hypertension: Secondary | ICD-10-CM

## 2016-06-25 LAB — BASIC METABOLIC PANEL
BUN: 12 mg/dL (ref 7–25)
CALCIUM: 9.4 mg/dL (ref 8.6–10.4)
CO2: 25 mmol/L (ref 20–31)
CREATININE: 2.01 mg/dL — AB (ref 0.50–0.99)
Chloride: 98 mmol/L (ref 98–110)
Glucose, Bld: 82 mg/dL (ref 65–99)
Potassium: 4.5 mmol/L (ref 3.5–5.3)
SODIUM: 134 mmol/L — AB (ref 135–146)

## 2016-06-26 ENCOUNTER — Telehealth: Payer: Self-pay

## 2016-06-26 NOTE — Telephone Encounter (Signed)
Called patient about echo results. Per Dr. Johnsie Cancel, patient needs a referral to EP and CHF clinic for further work up including possible right and left cath and ACID. Patient verbalized understanding. Will send message to CHF clinic and EP scheduler to call patient with an appointment.

## 2016-06-26 NOTE — Telephone Encounter (Signed)
-----   Message from Caroline Hector, MD sent at 06/26/2016 12:04 AM EDT ----- Can refer her to EP and CHF clinic for further w/u including possible right and left cath and AICD

## 2016-07-01 ENCOUNTER — Encounter: Payer: Self-pay | Admitting: Internal Medicine

## 2016-07-01 ENCOUNTER — Other Ambulatory Visit: Payer: Self-pay | Admitting: Family

## 2016-07-01 DIAGNOSIS — I6521 Occlusion and stenosis of right carotid artery: Secondary | ICD-10-CM

## 2016-07-02 ENCOUNTER — Ambulatory Visit (INDEPENDENT_AMBULATORY_CARE_PROVIDER_SITE_OTHER): Payer: Medicare Other | Admitting: Internal Medicine

## 2016-07-02 ENCOUNTER — Encounter: Payer: Self-pay | Admitting: Internal Medicine

## 2016-07-02 ENCOUNTER — Encounter (HOSPITAL_COMMUNITY): Payer: Self-pay | Admitting: General Practice

## 2016-07-02 ENCOUNTER — Other Ambulatory Visit: Payer: Self-pay

## 2016-07-02 ENCOUNTER — Inpatient Hospital Stay (HOSPITAL_COMMUNITY)
Admission: AD | Admit: 2016-07-02 | Discharge: 2016-07-05 | DRG: 286 | Disposition: A | Discharge status: Home / Self Care | Payer: Medicare Other | Source: Ambulatory Visit | Attending: Internal Medicine | Admitting: Internal Medicine

## 2016-07-02 VITALS — BP 90/60 | HR 102 | Ht 63.0 in | Wt 126.0 lb

## 2016-07-02 DIAGNOSIS — Z955 Presence of coronary angioplasty implant and graft: Secondary | ICD-10-CM

## 2016-07-02 DIAGNOSIS — E2740 Unspecified adrenocortical insufficiency: Secondary | ICD-10-CM | POA: Diagnosis present

## 2016-07-02 DIAGNOSIS — I340 Nonrheumatic mitral (valve) insufficiency: Secondary | ICD-10-CM | POA: Diagnosis present

## 2016-07-02 DIAGNOSIS — I951 Orthostatic hypotension: Secondary | ICD-10-CM | POA: Diagnosis present

## 2016-07-02 DIAGNOSIS — I2510 Atherosclerotic heart disease of native coronary artery without angina pectoris: Secondary | ICD-10-CM | POA: Diagnosis present

## 2016-07-02 DIAGNOSIS — I5030 Unspecified diastolic (congestive) heart failure: Secondary | ICD-10-CM

## 2016-07-02 DIAGNOSIS — I130 Hypertensive heart and chronic kidney disease with heart failure and stage 1 through stage 4 chronic kidney disease, or unspecified chronic kidney disease: Principal | ICD-10-CM | POA: Diagnosis present

## 2016-07-02 DIAGNOSIS — N183 Chronic kidney disease, stage 3 (moderate): Secondary | ICD-10-CM | POA: Diagnosis present

## 2016-07-02 DIAGNOSIS — E871 Hypo-osmolality and hyponatremia: Secondary | ICD-10-CM | POA: Diagnosis present

## 2016-07-02 DIAGNOSIS — I255 Ischemic cardiomyopathy: Secondary | ICD-10-CM | POA: Diagnosis present

## 2016-07-02 DIAGNOSIS — F329 Major depressive disorder, single episode, unspecified: Secondary | ICD-10-CM | POA: Diagnosis present

## 2016-07-02 DIAGNOSIS — D649 Anemia, unspecified: Secondary | ICD-10-CM | POA: Diagnosis present

## 2016-07-02 DIAGNOSIS — N059 Unspecified nephritic syndrome with unspecified morphologic changes: Secondary | ICD-10-CM | POA: Diagnosis present

## 2016-07-02 DIAGNOSIS — I5023 Acute on chronic systolic (congestive) heart failure: Secondary | ICD-10-CM | POA: Diagnosis present

## 2016-07-02 DIAGNOSIS — I5022 Chronic systolic (congestive) heart failure: Secondary | ICD-10-CM | POA: Diagnosis not present

## 2016-07-02 HISTORY — DX: Other chronic pain: G89.29

## 2016-07-02 HISTORY — DX: Bipolar disorder, unspecified: F31.9

## 2016-07-02 HISTORY — DX: Dorsalgia, unspecified: M54.9

## 2016-07-02 HISTORY — DX: Thyrotoxicosis with diffuse goiter without thyrotoxic crisis or storm: E05.00

## 2016-07-02 HISTORY — DX: Non-ST elevation (NSTEMI) myocardial infarction: I21.4

## 2016-07-02 HISTORY — DX: Malignant neoplasm of unspecified site of right female breast: C50.911

## 2016-07-02 LAB — CBC WITH DIFFERENTIAL/PLATELET
Basophils Absolute: 0.1 10*3/uL (ref 0.0–0.1)
Basophils Relative: 1 %
Eosinophils Absolute: 0.1 10*3/uL (ref 0.0–0.7)
Eosinophils Relative: 1 %
HCT: 31.9 % — ABNORMAL LOW (ref 36.0–46.0)
Hemoglobin: 10.5 g/dL — ABNORMAL LOW (ref 12.0–15.0)
Lymphocytes Relative: 27 %
Lymphs Abs: 1.8 10*3/uL (ref 0.7–4.0)
MCH: 28.5 pg (ref 26.0–34.0)
MCHC: 32.9 g/dL (ref 30.0–36.0)
MCV: 86.4 fL (ref 78.0–100.0)
Monocytes Absolute: 0.6 10*3/uL (ref 0.1–1.0)
Monocytes Relative: 9 %
Neutro Abs: 4.2 10*3/uL (ref 1.7–7.7)
Neutrophils Relative %: 62 %
Platelets: 262 10*3/uL (ref 150–400)
RBC: 3.69 MIL/uL — ABNORMAL LOW (ref 3.87–5.11)
RDW: 14.2 % (ref 11.5–15.5)
WBC: 6.7 10*3/uL (ref 4.0–10.5)

## 2016-07-02 LAB — COMPREHENSIVE METABOLIC PANEL
ALT: 9 U/L — ABNORMAL LOW (ref 14–54)
AST: 14 U/L — ABNORMAL LOW (ref 15–41)
Albumin: 3.5 g/dL (ref 3.5–5.0)
Alkaline Phosphatase: 61 U/L (ref 38–126)
Anion gap: 8 (ref 5–15)
BUN: 19 mg/dL (ref 6–20)
CO2: 22 mmol/L (ref 22–32)
Calcium: 9.7 mg/dL (ref 8.9–10.3)
Chloride: 99 mmol/L — ABNORMAL LOW (ref 101–111)
Creatinine, Ser: 2.12 mg/dL — ABNORMAL HIGH (ref 0.44–1.00)
GFR calc Af Amer: 27 mL/min — ABNORMAL LOW (ref >60)
GFR calc non Af Amer: 24 mL/min — ABNORMAL LOW (ref >60)
Glucose, Bld: 86 mg/dL (ref 65–99)
Potassium: 4.7 mmol/L (ref 3.5–5.1)
Sodium: 129 mmol/L — ABNORMAL LOW (ref 135–145)
Total Bilirubin: 0.5 mg/dL (ref 0.3–1.2)
Total Protein: 6.4 g/dL — ABNORMAL LOW (ref 6.5–8.1)

## 2016-07-02 LAB — TSH: TSH: 2.385 u[IU]/mL (ref 0.350–4.500)

## 2016-07-02 LAB — MAGNESIUM: Magnesium: 1.8 mg/dL (ref 1.7–2.4)

## 2016-07-02 MED ORDER — CLOPIDOGREL BISULFATE 75 MG PO TABS
75.0000 mg | ORAL_TABLET | Freq: Every day | ORAL | Status: DC
Start: 2016-07-03 — End: 2016-07-05
  Administered 2016-07-03 – 2016-07-05 (×3): 75 mg via ORAL
  Filled 2016-07-02 (×3): qty 1

## 2016-07-02 MED ORDER — ALPRAZOLAM 0.25 MG PO TABS: 0.2500 mg | ORAL_TABLET | Freq: Three times a day (TID) | ORAL | Status: DC | PRN

## 2016-07-02 MED ORDER — SODIUM CHLORIDE 0.9 % IV SOLN: 250.0000 mL | INTRAVENOUS | Status: DC | PRN

## 2016-07-02 MED ORDER — ASPIRIN EC 81 MG PO TBEC
81.0000 mg | DELAYED_RELEASE_TABLET | Freq: Every day | ORAL | Status: DC
  Administered 2016-07-04 – 2016-07-05 (×2): 81 mg via ORAL
  Filled 2016-07-02 (×3): qty 1

## 2016-07-02 MED ORDER — CLONAZEPAM 0.5 MG PO TABS
2.0000 mg | ORAL_TABLET | Freq: Two times a day (BID) | ORAL | Status: DC
  Administered 2016-07-02 – 2016-07-05 (×6): 2 mg via ORAL
  Filled 2016-07-02 (×6): qty 4

## 2016-07-02 MED ORDER — NITROGLYCERIN 0.4 MG SL SUBL: 0.4000 mg | SUBLINGUAL_TABLET | SUBLINGUAL | Status: DC | PRN

## 2016-07-02 MED ORDER — ACETAMINOPHEN 325 MG PO TABS: 650.0000 mg | ORAL_TABLET | ORAL | Status: DC | PRN

## 2016-07-02 MED ORDER — QUETIAPINE FUMARATE ER 300 MG PO TB24
300.0000 mg | ORAL_TABLET | Freq: Two times a day (BID) | ORAL | Status: DC
  Administered 2016-07-02 – 2016-07-05 (×6): 300 mg via ORAL
  Filled 2016-07-02 (×6): qty 1

## 2016-07-02 MED ORDER — ONDANSETRON HCL 4 MG/2ML IJ SOLN: 4.0000 mg | Freq: Four times a day (QID) | INTRAMUSCULAR | Status: DC | PRN

## 2016-07-02 MED ORDER — QUETIAPINE FUMARATE 400 MG PO TABS
400.0000 mg | ORAL_TABLET | Freq: Every day | ORAL | Status: DC
  Administered 2016-07-02 – 2016-07-04 (×3): 400 mg via ORAL
  Filled 2016-07-02 (×3): qty 1

## 2016-07-02 MED ORDER — SODIUM CHLORIDE 0.9% FLUSH
3.0000 mL | Freq: Two times a day (BID) | INTRAVENOUS | Status: DC
  Administered 2016-07-02 – 2016-07-03 (×2): 3 mL via INTRAVENOUS

## 2016-07-02 MED ORDER — SODIUM CHLORIDE 0.9% FLUSH: 3.0000 mL | INTRAVENOUS | Status: DC | PRN

## 2016-07-02 MED ORDER — RISPERIDONE 1 MG PO TABS
2.0000 mg | ORAL_TABLET | Freq: Three times a day (TID) | ORAL | Status: DC
  Administered 2016-07-02 – 2016-07-05 (×7): 2 mg via ORAL
  Filled 2016-07-02 (×6): qty 2
  Filled 2016-07-02: qty 1
  Filled 2016-07-02: qty 2

## 2016-07-02 MED ORDER — HEPARIN SODIUM (PORCINE) 5000 UNIT/ML IJ SOLN
5000.0000 [IU] | Freq: Three times a day (TID) | INTRAMUSCULAR | Status: DC
  Administered 2016-07-02: 5000 [IU] via SUBCUTANEOUS
  Filled 2016-07-02: qty 1

## 2016-07-02 NOTE — Patient Instructions (Signed)
Medication Instructions: - Your physician recommends that you continue on your current medications as directed. Please refer to the Current Medication list given to you today.  Labwork: - none  Procedures/Testing: - none  Follow-Up: - pending hospital discharge  Any Additional Special Instructions Will Be Listed Below (If Applicable). Proceed directly across the street to [facility]. Once you enter the driveway, proceed straight, this will curve around a bit You will enter the [facility], Entrance "A" Proceed straight down the hall way to Admitting to register, this will be on your left.  You will then be directed to your room - Unit [place]    If you need a refill on your cardiac medications before your next appointment, please call your pharmacy.

## 2016-07-02 NOTE — Progress Notes (Signed)
Pt admitted to 3e28. Pt is alert and oriented x 4. Vital signs within normal limits. Call bell within reach. On call MD paged for admission orders.

## 2016-07-02 NOTE — Progress Notes (Signed)
ELECTROPHYSIOLOGY CONSULT NOTE  Patient ID: Caroline [name], MRN: [id number], DOB/AGE: [birth year] 63 y.o. Admit date: (Not on file) Date of Consult: 07/02/2016  Primary Physician: [name], PA-C Primary Cardiologist: [place] [facility]  Chief Complaint: ICD    HPI Caroline [name] is a 65 y.o. female  Referred for consideration of an ICD  She has a history of coronary artery disease with stenting of her diagonal 02/10/10 . She has chronic kidney disease with creatinine most recently of 2.   10/14 Myoview EF 31% anterior anteroapical infarct unchanged from 2010 8/17 Echo EF 25% with severe MR.    Following her echo, Dr. Mariana Single recommended consideration of a right left heart catheterization and EP eval for ICD  She also has a history of dizziness and falls  8/16  cortisol was low. She was started on steroids. Now no longer   Her medical regime has been complicated by psychotropic meds   She has since been resumed by he psychiatrist on her seroquel and risperidal   She has been followed by Dr Cruzita Lederer  8/16 Cr 1.35  hemoglobin 8.7 8/17 Cr 2.01 K4.5   Past Medical History:  Diagnosis Date  . Angina   . Anxiety   . Arthritis    back  . Atrial fibrillation ([place])   . Bright disease   . CAD (coronary artery disease)    [facility]  Diagonal instent restenosis treated 10/2005  . Cancer [facility])    right breast 2001/02/10  . Carotid artery occlusion   . CHF (congestive heart failure) ([place])    states she was told se had this in past by MD  . Depression    2002/02/10 had shock treatment due to depresson, spouse passed away 2000/02/11  . Dyslipidemia   . GERD (gastroesophageal reflux disease)   . Grave's disease    pt denies  . Heart murmur    states years ago  . Hypertension    states a long time ago-states she never took medication  . Myocardial infarction [facility])    3 stents 1st 2004, has had several  . Shortness of breath    with exertion      Surgical History:  Past  Surgical History:  Procedure Laterality Date  . ABDOMINAL HYSTERECTOMY     partial  . BREAST LUMPECTOMY     right breast   . CAROTID ENDARTERECTOMY  01/08/12   Left  . CORONARY ANGIOPLASTY WITH STENT PLACEMENT    . ENDARTERECTOMY  01/08/2012   Procedure: ENDARTERECTOMY CAROTID;  Surgeon: Mal Misty, MD;  Location: [facility];  Service: Vascular;  Laterality: Left;  with Dacron patch angioplasty  . FRACTURE SURGERY  2006/02/10   Broken Back  . JOINT REPLACEMENT  Feb 10, 2010   Left total hip replacement  . MOUTH SURGERY       Home Meds: Prior to Admission medications   Medication Sig Start Date End Date Taking? Authorizing Provider  aspirin 325 MG tablet Take 325 mg by mouth daily.   Yes Historical Provider, MD  Black Cohosh 540 MG CAPS Take 540 mg by mouth 2 (two) times daily.   Yes Historical Provider, MD  clonazePAM (KLONOPIN) 1 MG tablet Take 2 mg by mouth 2 (two) times daily. Takes 1 tablet BID and 2 tablets at Bedtime.   Yes Historical Provider, MD  clopidogrel (PLAVIX) 75 MG tablet TAKE 1 TABLET BY MOUTH DAILY.  04/16/16  Yes Josue Hector, MD  HYDROcodone-acetaminophen (NORCO/VICODIN) 5-325 MG tablet Take 1 tablet by mouth every 8 (eight) hours as needed for moderate pain. 06/03/16  Yes Brunetta Jeans, PA-C  QUEtiapine (SEROQUEL XR) 300 MG 24 hr tablet Take 300 mg by mouth 2 (two) times daily.   Yes Historical Provider, MD  QUEtiapine (SEROQUEL) 400 MG tablet Take 400 mg by mouth at bedtime.   Yes Historical Provider, MD  risperiDONE (RISPERDAL) 2 MG tablet Take 2 mg by mouth 3 (three) times daily.    Yes Historical Provider, MD    Allergies: No Known Allergies  Social History   Social History  . Marital status: Widowed    Spouse name: N/A  . Number of children: N/A  . Years of education: N/A   Occupational History  . Not on file.   Social History Main Topics  . Smoking status: Former Smoker    Packs/day: 0.50    Years: 40.00    Types: Cigarettes  . Smokeless tobacco: Never Used       Comment: Quit >2 months ago  . Alcohol use No  . Drug use: No  . Sexual activity: Not on file   Other Topics Concern  . Not on file   Social History Narrative   She has 1 child.  She is on disability.     Family History  Problem Relation Age of Onset  . Coronary artery disease Father   . Prostate cancer Father     not sure  . Pancreatic cancer Father     not sure  . Cancer Father     Pancreatic and  Prostate  . Heart disease Father     Before age 21 and BPG  . Hyperlipidemia Father   . Heart attack Father   . Hypertension Mother   . Diabetes Sister   . Colon cancer Neg Hx      ROS:  Please see the history of present illness.   There is a lady that on worsening over I direct admission is wondering arrhythmia noted largely just help with the orders as I can't remember the officePni on her.  All other systems reviewed and negative.    Physical Exam:  Blood pressure 90/60, pulse (!) 102, height 5\' 3"  (1.6 m), weight 126 lb (57.2 kg), SpO2 97 %. General: Well developed, well nourished female in no acute distress. Head: Normocephalic, atraumatic, sclera non-icteric, no xanthomas, nares are without discharge. EENT: normal  Lymph Nodes:  none 10not elevated. Back:without scoliosis kyphosis  Lungs: Clear bilaterally to auscultation without wheezes, rales, or rhonchi. Breathing is unlabored. Heart: RRR with S1 S2. 2 /6 systolic murmur . No rubs, or gallops appreciated. Abdomen: Soft, non-tender, non-distended with normoactive bowel sounds. No hepatomegaly. No rebound/guarding. No obvious abdominal masses. Msk:  Strength and tone appear normal for age. Extremities: No clubbing or cyanosis. 1+* edema.  Distal pedal pulses are 2+ and equal bilaterally. Skin: Warm and Dry Neuro: Alert and oriented X 3. CN III-XII intact Grossly normal sensory and motor function . Psych:  Responds to questions appropriately with tearful and anxious affect .      Labs: Cardiac Enzymes No  results for input(s): CKTOTAL, CKMB, TROPONINI in the last 72 hours. CBC Lab Results  Component Value Date   WBC 7.0 06/18/2015   HGB 8.7 (L) 06/18/2015   HCT 25.9 (L) 06/18/2015   MCV 86.3 06/18/2015   PLT 279 06/18/2015   PROTIME: No results for input(s): LABPROT, INR in the last 72 hours. Chemistry  No results for input(s): NA, K, CL, CO2, BUN, CREATININE, CALCIUM, PROT, BILITOT, ALKPHOS, ALT, AST, GLUCOSE in the last 168 hours.  Invalid input(s): LABALBU Lipids Lab Results  Component Value Date   CHOL 264 (H) 04/23/2012   HDL 67 04/23/2012   LDLCALC 172 (H) 04/23/2012   TRIG 126 04/23/2012   BNP Pro B Natriuretic peptide (BNP)  Date/Time Value Ref Range Status  04/01/2009 03:00 AM 156.0 (H) 0.0 - 100.0 pg/mL Final  05/02/2008 01:30 PM 442.0 (H)  Final   Thyroid Function Tests: No results for input(s): TSH, T4TOTAL, T3FREE, THYROIDAB in the last 72 hours.  Invalid input(s): FREET3 Miscellaneous Lab Results  Component Value Date   DDIMER  03/31/2009    0.46        AT THE INHOUSE ESTABLISHED CUTOFF VALUE OF 0.48 ug/mL FEU, THIS ASSAY HAS BEEN DOCUMENTED IN THE LITERATURE TO HAVE A SENSITIVITY AND NEGATIVE PREDICTIVE VALUE OF AT LEAST 98 TO 99%.  THE TEST RESULT SHOULD BE CORRELATED WITH AN ASSESSMENT OF THE CLINICAL PROBABILITY OF DVT / VTE.    Radiology/Studies:  No results found.  EKG:  NSR 93 24/10/42    Assessment and Plan:  Ischemic cardiomyopathy  Mitral regurgitation-severe  CHF systolic acute and chronic   High Anxiety/Depression  Hypotension//Orthostatic  Falls with hx of Cortisol deficiency\  Renal Insufficiency  Anemia    Her orthopnea and PND are very disruptive and anxiety provoking.  There is no signif fluid, but increased JVP  Her MR may be positional trigger, but there is also a striking anxiety component and we need to be able to separate the two.  Dr PN recommendation was to do R and L heart cath. Will plan for AM  although the timing may need to be adjusted giving her renal issues. We'll reassess this tonight.   Managing her CHF will be difficult with low BP   Dig may be of benefit, notwithstanding sinus but will defer to CHF team             Virl Axe

## 2016-07-03 ENCOUNTER — Encounter (HOSPITAL_COMMUNITY)
Admission: AD | Disposition: A | Discharge status: Home / Self Care | Payer: Self-pay | Source: Ambulatory Visit | Attending: Internal Medicine

## 2016-07-03 ENCOUNTER — Ambulatory Visit (HOSPITAL_COMMUNITY): Admission: RE | Admit: 2016-07-03 | Payer: Medicare Other | Source: Ambulatory Visit | Admitting: Cardiology

## 2016-07-03 DIAGNOSIS — N183 Chronic kidney disease, stage 3 (moderate): Secondary | ICD-10-CM

## 2016-07-03 DIAGNOSIS — I5023 Acute on chronic systolic (congestive) heart failure: Secondary | ICD-10-CM

## 2016-07-03 HISTORY — PX: CARDIAC CATHETERIZATION: SHX172

## 2016-07-03 LAB — BASIC METABOLIC PANEL
Anion gap: 7 (ref 5–15)
BUN: 24 mg/dL — ABNORMAL HIGH (ref 6–20)
CO2: 24 mmol/L (ref 22–32)
Calcium: 9.5 mg/dL (ref 8.9–10.3)
Chloride: 99 mmol/L — ABNORMAL LOW (ref 101–111)
Creatinine, Ser: 2.15 mg/dL — ABNORMAL HIGH (ref 0.44–1.00)
GFR calc Af Amer: 27 mL/min — ABNORMAL LOW (ref >60)
GFR calc non Af Amer: 23 mL/min — ABNORMAL LOW (ref >60)
Glucose, Bld: 85 mg/dL (ref 65–99)
Potassium: 4.4 mmol/L (ref 3.5–5.1)
Sodium: 130 mmol/L — ABNORMAL LOW (ref 135–145)

## 2016-07-03 LAB — POCT I-STAT 3, VENOUS BLOOD GAS (G3P V)
ACID-BASE DEFICIT: 5 mmol/L — AB (ref 0.0–2.0)
Acid-base deficit: 6 mmol/L — ABNORMAL HIGH (ref 0.0–2.0)
Bicarbonate: 19.5 mmol/L — ABNORMAL LOW (ref 20.0–28.0)
Bicarbonate: 20.9 mmol/L (ref 20.0–28.0)
O2 SAT: 62 %
O2 SAT: 62 %
PCO2 VEN: 42.7 mmHg — AB (ref 44.0–60.0)
TCO2: 21 mmol/L (ref 0–100)
TCO2: 22 mmol/L (ref 0–100)
pCO2, Ven: 39.1 mmHg — ABNORMAL LOW (ref 44.0–60.0)
pH, Ven: 7.298 (ref 7.250–7.430)
pH, Ven: 7.307 (ref 7.250–7.430)
pO2, Ven: 35 mmHg (ref 32.0–45.0)
pO2, Ven: 36 mmHg (ref 32.0–45.0)

## 2016-07-03 LAB — POCT I-STAT 3, ART BLOOD GAS (G3+)
ACID-BASE DEFICIT: 7 mmol/L — AB (ref 0.0–2.0)
BICARBONATE: 18.8 mmol/L — AB (ref 20.0–28.0)
O2 SAT: 97 %
TCO2: 20 mmol/L (ref 0–100)
pCO2 arterial: 38 mmHg (ref 32.0–48.0)
pH, Arterial: 7.301 — ABNORMAL LOW (ref 7.350–7.450)
pO2, Arterial: 98 mmHg (ref 83.0–108.0)

## 2016-07-03 LAB — PROTIME-INR
INR: 0.96
PROTHROMBIN TIME: 12.8 s (ref 11.4–15.2)

## 2016-07-03 SURGERY — RIGHT/LEFT HEART CATH AND CORONARY ANGIOGRAPHY
Anesthesia: LOCAL

## 2016-07-03 MED ORDER — ASPIRIN 81 MG PO CHEW
81.0000 mg | CHEWABLE_TABLET | ORAL | Status: AC
  Administered 2016-07-03: 81 mg via ORAL
  Filled 2016-07-03: qty 1

## 2016-07-03 MED ORDER — LIDOCAINE HCL (PF) 1 % IJ SOLN
INTRAMUSCULAR | Status: AC
  Filled 2016-07-03: qty 30

## 2016-07-03 MED ORDER — LIDOCAINE HCL (PF) 1 % IJ SOLN
INTRAMUSCULAR | Status: DC | PRN
  Administered 2016-07-03: 17 mL via INTRADERMAL

## 2016-07-03 MED ORDER — FENTANYL CITRATE (PF) 100 MCG/2ML IJ SOLN
INTRAMUSCULAR | Status: DC | PRN
  Administered 2016-07-03: 12.5 ug via INTRAVENOUS
  Administered 2016-07-03: 25 ug via INTRAVENOUS

## 2016-07-03 MED ORDER — SODIUM CHLORIDE 0.9% FLUSH: 3.0000 mL | Freq: Two times a day (BID) | INTRAVENOUS | Status: DC

## 2016-07-03 MED ORDER — SODIUM CHLORIDE 0.9 % IV SOLN: INTRAVENOUS | Status: DC

## 2016-07-03 MED ORDER — SODIUM CHLORIDE 0.9 % IV SOLN: 250.0000 mL | INTRAVENOUS | Status: DC | PRN

## 2016-07-03 MED ORDER — MIDAZOLAM HCL 2 MG/2ML IJ SOLN
INTRAMUSCULAR | Status: AC
  Filled 2016-07-03: qty 2

## 2016-07-03 MED ORDER — MIDAZOLAM HCL 2 MG/2ML IJ SOLN
INTRAMUSCULAR | Status: DC | PRN
  Administered 2016-07-03: 0.5 mg via INTRAVENOUS

## 2016-07-03 MED ORDER — SODIUM CHLORIDE 0.9% FLUSH
3.0000 mL | Freq: Two times a day (BID) | INTRAVENOUS | Status: DC
  Administered 2016-07-04 – 2016-07-05 (×2): 3 mL via INTRAVENOUS

## 2016-07-03 MED ORDER — IOPAMIDOL (ISOVUE-370) INJECTION 76%
INTRAVENOUS | Status: DC | PRN
  Administered 2016-07-03: 50 mL via INTRA_ARTERIAL

## 2016-07-03 MED ORDER — IOPAMIDOL (ISOVUE-370) INJECTION 76%
INTRAVENOUS | Status: AC
  Filled 2016-07-03: qty 100

## 2016-07-03 MED ORDER — FENTANYL CITRATE (PF) 100 MCG/2ML IJ SOLN
INTRAMUSCULAR | Status: AC
  Filled 2016-07-03: qty 2

## 2016-07-03 MED ORDER — HEPARIN (PORCINE) IN NACL 2-0.9 UNIT/ML-% IJ SOLN
INTRAMUSCULAR | Status: DC | PRN
  Administered 2016-07-03: 1000 mL

## 2016-07-03 MED ORDER — HEPARIN (PORCINE) IN NACL 2-0.9 UNIT/ML-% IJ SOLN
INTRAMUSCULAR | Status: AC
  Filled 2016-07-03: qty 1000

## 2016-07-03 MED ORDER — ALUM & MAG HYDROXIDE-SIMETH 200-200-20 MG/5ML PO SUSP
15.0000 mL | ORAL | Status: DC | PRN
  Administered 2016-07-03: 15 mL via ORAL
  Filled 2016-07-03: qty 30

## 2016-07-03 MED ORDER — SODIUM CHLORIDE 0.9% FLUSH: 3.0000 mL | INTRAVENOUS | Status: DC | PRN

## 2016-07-03 MED ORDER — HEPARIN (PORCINE) IN NACL 100-0.45 UNIT/ML-% IJ SOLN
750.0000 [IU]/h | INTRAMUSCULAR | Status: DC
  Administered 2016-07-04: 750 [IU]/h via INTRAVENOUS
  Filled 2016-07-03: qty 250

## 2016-07-03 MED ORDER — SODIUM CHLORIDE 0.9 % IV SOLN
INTRAVENOUS | Status: DC
  Administered 2016-07-03: 12:00:00 via INTRAVENOUS

## 2016-07-03 MED ORDER — SODIUM CHLORIDE 0.9% FLUSH
3.0000 mL | Freq: Two times a day (BID) | INTRAVENOUS | Status: DC
  Administered 2016-07-03: 3 mL via INTRAVENOUS

## 2016-07-03 SURGICAL SUPPLY — 10 items
CATH INFINITI 5FR MULTPACK ANG (CATHETERS) ×1 IMPLANT
CATH SWAN GANZ 7F STRAIGHT (CATHETERS) ×1 IMPLANT
KIT HEART LEFT (KITS) ×2 IMPLANT
KIT HEART RIGHT NAMIC (KITS) ×2 IMPLANT
PACK CARDIAC CATHETERIZATION (CUSTOM PROCEDURE TRAY) ×2 IMPLANT
SHEATH PINNACLE 5F 10CM (SHEATH) ×1 IMPLANT
SHEATH PINNACLE 7F 10CM (SHEATH) ×1 IMPLANT
TRANSDUCER W/STOPCOCK (MISCELLANEOUS) ×4 IMPLANT
TUBING CIL FLEX 10 FLL-RA (TUBING) ×2 IMPLANT
WIRE EMERALD 3MM-J .035X150CM (WIRE) ×1 IMPLANT

## 2016-07-03 NOTE — Interval H&P Note (Signed)
History and Physical Interval Note:  07/03/2016 5:06 PM  Caroline [name]  has presented today for surgery, with the diagnosis of Heart Failure and severe mitral regurgitation.  The various methods of treatment have been discussed with the patient and family. After consideration of risks, benefits and other options for treatment, the patient has consented to  Procedure(s): Right/Left Heart Cath and Coronary Angiography (N/A) as a surgical intervention .  The patient's history has been reviewed, patient examined, no change in status, stable for surgery.  I have reviewed the patient's chart and labs.  Questions were answered to the patient's satisfaction.     Glenetta Hew

## 2016-07-03 NOTE — Progress Notes (Addendum)
ANTICOAGULATION CONSULT NOTE - Initial Consult  Pharmacy Consult for heparin Indication: chest pain/ACS  No Known Allergies  Patient Measurements: Height: 5\' 3"  (160 cm) Weight: 123 lb (55.8 kg) (b scale) IBW/kg (Calculated) : 52.4 Heparin Dosing Weight: 55.8kg  Vital Signs: Temp: 97.6 F (36.4 C) (08/31 0801) Temp Source: Oral (08/31 0801) BP: 109/64 (08/31 1900) Pulse Rate: 84 (08/31 1900)  Labs:  Recent Labs  07/02/16 1953 07/03/16 0336 07/03/16 0450  HGB 10.5*  --   --   HCT 31.9*  --   --   PLT 262  --   --   LABPROT  --   --  12.8  INR  --   --  0.96  CREATININE 2.12* 2.15*  --     Estimated Creatinine Clearance (by C-G formula based on SCr of 2.15 mg/dL) Female: 22.2 mL/min Female: 27.8 mL/min   Medical History: Past Medical History:  Diagnosis Date  . Angina   . Anxiety   . Arthritis    "severe in my back" (07/02/2016)  . Atrial fibrillation ([place])   . Bipolar disorder ([place])   . Bright disease   . CAD (coronary artery disease)    [facility]  Diagonal instent restenosis treated 10/2005  . Cancer of right breast ([place]) 02/03/2001  . Carotid artery occlusion   . CHF (congestive heart failure) ([place])    states she was told se had this in past by MD  . Chronic back pain   . Depression    2002-02-03 had shock treatment due to depresson, spouse passed away 02/04/2000  . Dyslipidemia   . GERD (gastroesophageal reflux disease)   . Graves' disease    /notes 06/03/2010  . Heart murmur    states years ago  . Hypertension    states a long time ago-states she never took medication  . Non Q wave myocardial infarction ([place]) 03/2004   Archie Endo 06/03/2010  . Shortness of breath    with exertion   Assessment: 45 YOF s/p cath, with severe 2V CAD and recurrent pre-  and in-stent restenosis. Pharmacy is consulted to start heparin 8 hrs after sheath removal (~ 1830). Pending decision on redo PCI vs. CABG with MVR. Hgb 10.5, pltc wnl.   Goal of Therapy:  Heparin level 0.3-0.7  units/ml Monitor platelets by anticoagulation protocol: Yes   Plan:  - Start heparin infusion 750 units/hr at 0230 on 9/1, No bolus - f/u heparin level at 1000 - daily heparin level and CBC - f/u plans for further procedures - d/c sq heparin  Maryanna Shape, PharmD, BCPS  Clinical Pharmacist  Pager: [phone]   07/03/2016,7:17 PM

## 2016-07-03 NOTE — H&P (View-Only) (Signed)
Patient Name: Caroline [name] Date of Encounter: 07/03/2016  Active Problems:   Acute on chronic systolic (congestive) heart failure [facility])   Primary Cardiologist: Dr. Johnsie Cancel Patient Profile: Caroline [name] is a 65 y.o. 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 Admitted on 07/02/16 for right and left heart cath (pending renal function) and consideration for ICD placement.   SUBJECTIVE: Feels well, no chest pain or orthopnea.    OBJECTIVE Vitals:   07/02/16 2300 07/03/16 0153 07/03/16 0550 07/03/16 0801  BP: (!) 92/50  (!) 98/56 (!) 91/51  Pulse: 90 87 80 76  Resp: 18   16  Temp: 98.2 F (36.8 C)  98.5 F (36.9 C) 97.6 F (36.4 C)  TempSrc: Oral  Oral Oral  SpO2: 98% 99% 97% 96%  Weight:   123 lb (55.8 kg)   Height:        Intake/Output Summary (Last 24 hours) at 07/03/16 0816 Last data filed at 07/03/16 0550  Gross per 24 hour  Intake                0 ml  Output              800 ml  Net             -800 ml   Filed Weights   07/02/16 1856 07/03/16 0550  Weight: 123 lb 11.2 oz (56.1 kg) 123 lb (55.8 kg)    PHYSICAL EXAM General: Well developed, well nourished, female in no acute distress. Head: Normocephalic, atraumatic.  Neck: Supple without bruits, no JVD. Lungs:  Resp regular and unlabored, CTA. Heart: RRR, S1, S2, no S3, S4, 2/6 systolic murmur heard, no rub. Abdomen: Soft, non-tender, non-distended, BS + x 4.  Extremities: No clubbing, cyanosis, no edema.  Neuro: Alert and oriented X 3. Moves all extremities spontaneously. Psych: Flat affect   LABS: CBC: Recent Labs  07/02/16 1953  WBC 6.7  NEUTROABS 4.2  HGB 10.5*  HCT 31.9*  MCV 86.4  PLT 262   INR: Recent Labs  07/03/16 0450  INR 123456   Basic Metabolic Panel: Recent Labs   07/02/16 1953 07/03/16 0336  NA 129* 130*  K 4.7 4.4  CL 99* 99*  CO2 22 24  GLUCOSE 86 85  BUN 19 24*  CREATININE 2.12* 2.15*  CALCIUM 9.7 9.5  MG 1.8  --    Liver Function Tests: Recent Labs  07/02/16 1953  AST 14*  ALT 9*  ALKPHOS 61  BILITOT 0.5  PROT 6.4*  ALBUMIN 3.5   BNP:  Brain Natriuretic Peptide  Date/Time Value Ref Range Status  06/18/2016 02:25 PM 275.8 (H) <100 pg/mL Final    Comment:      BNP levels increase with age in the general population with the highest values seen in individuals greater than 9 years of age. Reference: Joellyn Rued Cardiol 2002; [id number].      Thyroid Function Tests: Recent Labs  07/02/16 1953  TSH 2.385     Current Facility-Administered Medications:  .  0.9 %  sodium chloride infusion, 250 mL, Intravenous, PRN, Doreene Burke Kilroy, PA-C .  0.9 %  sodium chloride infusion, 250 mL, Intravenous, PRN, Leonie Man, MD .  0.9 %  sodium chloride infusion, , Intravenous, Continuous,  Leonie Man, MD .  acetaminophen (TYLENOL) tablet 650 mg, 650 mg, Oral, Q4H PRN, Erlene Quan, PA-C .  ALPRAZolam (XANAX) tablet 0.25 mg, 0.25 mg, Oral, TID PRN, Erlene Quan, PA-C .  aspirin EC tablet 81 mg, 81 mg, Oral, Daily, Luke K Kilroy, PA-C .  clonazePAM [facility]) tablet 2 mg, 2 mg, Oral, BID, Doreene Burke Kilroy, PA-C, 2 mg at 07/02/16 2200 .  clopidogrel (PLAVIX) tablet 75 mg, 75 mg, Oral, Daily, Luke K Kilroy, PA-C .  heparin injection 5,000 Units, 5,000 Units, Subcutaneous, Q8H, Doreene Burke Matthews, [place], Stopped at 07/03/16 1400 .  nitroGLYCERIN (NITROSTAT) SL tablet 0.4 mg, 0.4 mg, Sublingual, Q5 Min x 3 PRN, Luke K Kilroy, PA-C .  ondansetron [facility]) injection 4 mg, 4 mg, Intravenous, Q6H PRN, Erlene Quan, PA-C .  QUEtiapine (SEROQUEL XR) 24 hr tablet 300 mg, 300 mg, Oral, BID, Doreene Burke Kilroy, PA-C, 300 mg at 07/02/16 2201 .  QUEtiapine (SEROQUEL) tablet 400 mg, 400 mg, Oral, QHS, Luke K Kilroy, PA-C, 400 mg at 07/02/16 2200 .  risperiDONE  (RISPERDAL) tablet 2 mg, 2 mg, Oral, TID, [employer], PA-C, 2 mg at 07/02/16 2200 .  sodium chloride flush (NS) 0.9 % injection 3 mL, 3 mL, Intravenous, Q12H, [employer], PA-C, 3 mL at 07/02/16 2251 .  sodium chloride flush (NS) 0.9 % injection 3 mL, 3 mL, Intravenous, PRN, Doreene Burke Kilroy, PA-C .  sodium chloride flush (NS) 0.9 % injection 3 mL, 3 mL, Intravenous, Q12H, Leonie Man, MD .  sodium chloride flush (NS) 0.9 % injection 3 mL, 3 mL, Intravenous, PRN, Leonie Man, MD . sodium chloride      TELE:  NSR    ECG: NSR  Echo 06/18/16 - Left ventricle: The cavity size was moderately dilated. Wall   thickness was normal. The estimated ejection fraction was 25%.   Diffuse hypokinesis. Doppler parameters are consistent with   restrictive physiology, indicative of decreased left ventricular   diastolic compliance and/or increased left atrial pressure.   E/medial e&' > 15 suggests LV end diastolic pressure at least 20   mmHg. - Aortic valve: There was no stenosis. There was mild   regurgitation. - Mitral valve: Mildly calcified annulus. There was severe   regurgitation, suspect functional due to annular dilatation. - Left atrium: The atrium was moderately dilated. - Right ventricle: The cavity size was normal. Systolic function   was normal. - Tricuspid valve: Peak RV-RA gradient (S): 23 mm Hg. - Pulmonary arteries: PA peak pressure: 26 mm Hg (S). - Inferior vena cava: The vessel was normal in size. The   respirophasic diameter changes were in the normal range (= 50%),   consistent with normal central venous pressure.  Impressions:  - Moderate dilated LV with diffuse hypokinesis, EF 25%. Restrictive   diastolic function with evidence for elevated LV filling   pressure. Normal RV size and systolic function. Severe mitral   regurgitation, likely functional due to annular dilatation.   Consider HF clinic referral. Current Medications:  . aspirin EC  81 mg Oral Daily    . clonazePAM  2 mg Oral BID  . clopidogrel  75 mg Oral Daily  . heparin  5,000 Units Subcutaneous Q8H  . QUEtiapine  300 mg Oral BID  . QUEtiapine  400 mg Oral QHS  . risperiDONE  2 mg Oral TID  . sodium chloride flush  3 mL Intravenous Q12H  . sodium chloride flush  3  mL Intravenous Q12H   . sodium chloride      ASSESSMENT AND PLAN: Active Problems:   Acute on chronic systolic (congestive) heart failure ([place])  1. Acute on chronic systolic CHF: EF is 123456 with diffuse hypokinesis. She was referred to EP for AICD for ischemic cardiomyopathy. Admitted yesterday from office as she needs right and left heart cath.   Does not appear volume overloaded on exam. She is not on any CHF meds due to hypotension.   2. Chronic kidney disease stage III: Baseline is 2.0. She is getting hydration currently, however with low EF she is at risk for volume overload.  MD to advise on timing of cath.  3. Chronic hyponatremia: this is an ongoing issue related to her psych meds. Will continue to follow.   4. Severe mitral regurgitation  Signed, Arbutus Leas , NP 8:16 AM 07/03/2016 Pager [phone]  Patient seen, examined. Available data reviewed. Agree with findings, assessment, and plan as outlined by Jettie Booze, NP. The patient is independently interviewed and examined. Reviewed notes of Dr. Johnsie Cancel and Dr. Caryl Comes. Reviewed her echocardiogram which shows severe LV dysfunction and severe mitral regurgitation. The patient has chronic kidney disease and her creatinine is at baseline. She has not been able to tolerate an aggressive medical program because of chronic hypotension. She is here for right and left heart catheterization. I think it is reasonable to proceed today with a limited contrast study to include coronary angiography and hemodynamic assessment. Advanced CHF consult has been recommended and I will notify them of her admission. Probably would pursue a transesophageal echocardiogram to better  evaluate her mitral regurgitation but will await results of her cardiac catheterization first. Will give gnetle IV fluids prior to cath.  I have reviewed the risks, indications, and alternatives to cardiac catheterization, possible angioplasty, and stenting with the patient. Risks include but are not limited to bleeding, infection, vascular injury, stroke, myocardial infection, arrhythmia, kidney injury, radiation-related injury in the case of prolonged fluoroscopy use, emergency cardiac surgery, and death. The patient understands the risks of serious complication is 1-2 in [id number] with diagnostic cardiac cath and 1-2% or less with angioplasty/stenting.   Sherren Mocha, M.D. 07/03/2016 12:15 PM

## 2016-07-03 NOTE — Progress Notes (Signed)
Patient complained of sob. Checked SATS was 99% and she remained asymptomatic. Md. Koleen Nimrod was notified of patient's complaint. No additional orders given. Will continue to monitor for patient safety and comfort.

## 2016-07-03 NOTE — Progress Notes (Signed)
Attempted to insert a PIV but was unsuccessful. IV team was consulted to attempted to start both IV's for Cardiac Cath so that fluids could be started. Awaiting their arrival. Will continue to monitor patient safety.

## 2016-07-03 NOTE — Progress Notes (Addendum)
Patient Name: Caroline [name] Date of Encounter: 07/03/2016  Active Problems:   Acute on chronic systolic (congestive) heart failure [facility])   Primary Cardiologist: Dr. Johnsie Cancel Patient Profile: TYKEIRA FORTES is a 65 y.o. 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 Admitted on 07/02/16 for right and left heart cath (pending renal function) and consideration for ICD placement.   SUBJECTIVE: Feels well, no chest pain or orthopnea.    OBJECTIVE Vitals:   07/02/16 2300 07/03/16 0153 07/03/16 0550 07/03/16 0801  BP: (!) 92/50  (!) 98/56 (!) 91/51  Pulse: 90 87 80 76  Resp: 18   16  Temp: 98.2 F (36.8 C)  98.5 F (36.9 C) 97.6 F (36.4 C)  TempSrc: Oral  Oral Oral  SpO2: 98% 99% 97% 96%  Weight:   123 lb (55.8 kg)   Height:        Intake/Output Summary (Last 24 hours) at 07/03/16 0816 Last data filed at 07/03/16 0550  Gross per 24 hour  Intake                0 ml  Output              800 ml  Net             -800 ml   Filed Weights   07/02/16 1856 07/03/16 0550  Weight: 123 lb 11.2 oz (56.1 kg) 123 lb (55.8 kg)    PHYSICAL EXAM General: Well developed, well nourished, female in no acute distress. Head: Normocephalic, atraumatic.  Neck: Supple without bruits, no JVD. Lungs:  Resp regular and unlabored, CTA. Heart: RRR, S1, S2, no S3, S4, 2/6 systolic murmur heard, no rub. Abdomen: Soft, non-tender, non-distended, BS + x 4.  Extremities: No clubbing, cyanosis, no edema.  Neuro: Alert and oriented X 3. Moves all extremities spontaneously. Psych: Flat affect   LABS: CBC: Recent Labs  07/02/16 1953  WBC 6.7  NEUTROABS 4.2  HGB 10.5*  HCT 31.9*  MCV 86.4  PLT 262   INR: Recent Labs  07/03/16 0450  INR 123456   Basic Metabolic Panel: Recent Labs   07/02/16 1953 07/03/16 0336  NA 129* 130*  K 4.7 4.4  CL 99* 99*  CO2 22 24  GLUCOSE 86 85  BUN 19 24*  CREATININE 2.12* 2.15*  CALCIUM 9.7 9.5  MG 1.8  --    Liver Function Tests: Recent Labs  07/02/16 1953  AST 14*  ALT 9*  ALKPHOS 61  BILITOT 0.5  PROT 6.4*  ALBUMIN 3.5   BNP:  Brain Natriuretic Peptide  Date/Time Value Ref Range Status  06/18/2016 02:25 PM 275.8 (H) <100 pg/mL Final    Comment:      BNP levels increase with age in the general population with the highest values seen in individuals greater than 32 years of age. Reference: Joellyn Rued Cardiol 2002; [id number].      Thyroid Function Tests: Recent Labs  07/02/16 1953  TSH 2.385     Current Facility-Administered Medications:  .  0.9 %  sodium chloride infusion, 250 mL, Intravenous, PRN, Doreene Burke Kilroy, PA-C .  0.9 %  sodium chloride infusion, 250 mL, Intravenous, PRN, Leonie Man, MD .  0.9 %  sodium chloride infusion, , Intravenous, Continuous,  Leonie Man, MD .  acetaminophen (TYLENOL) tablet 650 mg, 650 mg, Oral, Q4H PRN, Erlene Quan, PA-C .  ALPRAZolam (XANAX) tablet 0.25 mg, 0.25 mg, Oral, TID PRN, Erlene Quan, PA-C .  aspirin EC tablet 81 mg, 81 mg, Oral, Daily, Luke K Kilroy, PA-C .  clonazePAM [facility]) tablet 2 mg, 2 mg, Oral, BID, Doreene Burke Kilroy, PA-C, 2 mg at 07/02/16 2200 .  clopidogrel (PLAVIX) tablet 75 mg, 75 mg, Oral, Daily, Luke K Kilroy, PA-C .  heparin injection 5,000 Units, 5,000 Units, Subcutaneous, Q8H, Doreene Burke [place], [place], Stopped at 07/03/16 1400 .  nitroGLYCERIN (NITROSTAT) SL tablet 0.4 mg, 0.4 mg, Sublingual, Q5 Min x 3 PRN, Luke K Kilroy, PA-C .  ondansetron [facility]) injection 4 mg, 4 mg, Intravenous, Q6H PRN, Erlene Quan, PA-C .  QUEtiapine (SEROQUEL XR) 24 hr tablet 300 mg, 300 mg, Oral, BID, Doreene Burke Kilroy, PA-C, 300 mg at 07/02/16 2201 .  QUEtiapine (SEROQUEL) tablet 400 mg, 400 mg, Oral, QHS, Luke K Kilroy, PA-C, 400 mg at 07/02/16 2200 .  risperiDONE  (RISPERDAL) tablet 2 mg, 2 mg, Oral, TID, [employer], PA-C, 2 mg at 07/02/16 2200 .  sodium chloride flush (NS) 0.9 % injection 3 mL, 3 mL, Intravenous, Q12H, [employer], PA-C, 3 mL at 07/02/16 2251 .  sodium chloride flush (NS) 0.9 % injection 3 mL, 3 mL, Intravenous, PRN, Doreene Burke Kilroy, PA-C .  sodium chloride flush (NS) 0.9 % injection 3 mL, 3 mL, Intravenous, Q12H, Leonie Man, MD .  sodium chloride flush (NS) 0.9 % injection 3 mL, 3 mL, Intravenous, PRN, Leonie Man, MD . sodium chloride      TELE:  NSR    ECG: NSR  Echo 06/18/16 - Left ventricle: The cavity size was moderately dilated. Wall   thickness was normal. The estimated ejection fraction was 25%.   Diffuse hypokinesis. Doppler parameters are consistent with   restrictive physiology, indicative of decreased left ventricular   diastolic compliance and/or increased left atrial pressure.   E/medial e&' > 15 suggests LV end diastolic pressure at least 20   mmHg. - Aortic valve: There was no stenosis. There was mild   regurgitation. - Mitral valve: Mildly calcified annulus. There was severe   regurgitation, suspect functional due to annular dilatation. - Left atrium: The atrium was moderately dilated. - Right ventricle: The cavity size was normal. Systolic function   was normal. - Tricuspid valve: Peak RV-RA gradient (S): 23 mm Hg. - Pulmonary arteries: PA peak pressure: 26 mm Hg (S). - Inferior vena cava: The vessel was normal in size. The   respirophasic diameter changes were in the normal range (= 50%),   consistent with normal central venous pressure.  Impressions:  - Moderate dilated LV with diffuse hypokinesis, EF 25%. Restrictive   diastolic function with evidence for elevated LV filling   pressure. Normal RV size and systolic function. Severe mitral   regurgitation, likely functional due to annular dilatation.   Consider HF clinic referral. Current Medications:  . aspirin EC  81 mg Oral Daily    . clonazePAM  2 mg Oral BID  . clopidogrel  75 mg Oral Daily  . heparin  5,000 Units Subcutaneous Q8H  . QUEtiapine  300 mg Oral BID  . QUEtiapine  400 mg Oral QHS  . risperiDONE  2 mg Oral TID  . sodium chloride flush  3 mL Intravenous Q12H  . sodium chloride flush  3  mL Intravenous Q12H   . sodium chloride      ASSESSMENT AND PLAN: Active Problems:   Acute on chronic systolic (congestive) heart failure ([place])  1. Acute on chronic systolic CHF: EF is 123456 with diffuse hypokinesis. She was referred to EP for AICD for ischemic cardiomyopathy. Admitted yesterday from office as she needs right and left heart cath.   Does not appear volume overloaded on exam. She is not on any CHF meds due to hypotension.   2. Chronic kidney disease stage III: Baseline is 2.0. She is getting hydration currently, however with low EF she is at risk for volume overload.  MD to advise on timing of cath.  3. Chronic hyponatremia: this is an ongoing issue related to her psych meds. Will continue to follow.   4. Severe mitral regurgitation  Signed, Arbutus Leas , NP 8:16 AM 07/03/2016 Pager [phone]  Patient seen, examined. Available data reviewed. Agree with findings, assessment, and plan as outlined by Jettie Booze, NP. The patient is independently interviewed and examined. Reviewed notes of Dr. Johnsie Cancel and Dr. Caryl Comes. Reviewed her echocardiogram which shows severe LV dysfunction and severe mitral regurgitation. The patient has chronic kidney disease and her creatinine is at baseline. She has not been able to tolerate an aggressive medical program because of chronic hypotension. She is here for right and left heart catheterization. I think it is reasonable to proceed today with a limited contrast study to include coronary angiography and hemodynamic assessment. Advanced CHF consult has been recommended and I will notify them of her admission. Probably would pursue a transesophageal echocardiogram to better  evaluate her mitral regurgitation but will await results of her cardiac catheterization first. Will give gnetle IV fluids prior to cath.  I have reviewed the risks, indications, and alternatives to cardiac catheterization, possible angioplasty, and stenting with the patient. Risks include but are not limited to bleeding, infection, vascular injury, stroke, myocardial infection, arrhythmia, kidney injury, radiation-related injury in the case of prolonged fluoroscopy use, emergency cardiac surgery, and death. The patient understands the risks of serious complication is 1-2 in [id number] with diagnostic cardiac cath and 1-2% or less with angioplasty/stenting.   Sherren Mocha, M.D. 07/03/2016 12:15 PM

## 2016-07-03 NOTE — Progress Notes (Signed)
Responded to spiritual care consult for prayer. Patient is sleeping. I spoke with patients nurse. Patient is scheduled to go to cath lab at [place] was made on behalf of patient. Chaplain available as needed.  Jaclynn Major, [place], [facility], Pager [phone]

## 2016-07-04 ENCOUNTER — Inpatient Hospital Stay (HOSPITAL_COMMUNITY): Payer: Medicare Other

## 2016-07-04 ENCOUNTER — Encounter (HOSPITAL_COMMUNITY): Payer: Self-pay | Admitting: Cardiology

## 2016-07-04 ENCOUNTER — Encounter (HOSPITAL_COMMUNITY)
Admission: AD | Disposition: A | Discharge status: Home / Self Care | Payer: Self-pay | Source: Ambulatory Visit | Attending: Internal Medicine

## 2016-07-04 DIAGNOSIS — I2510 Atherosclerotic heart disease of native coronary artery without angina pectoris: Secondary | ICD-10-CM

## 2016-07-04 DIAGNOSIS — I5022 Chronic systolic (congestive) heart failure: Secondary | ICD-10-CM

## 2016-07-04 DIAGNOSIS — I340 Nonrheumatic mitral (valve) insufficiency: Secondary | ICD-10-CM

## 2016-07-04 HISTORY — PX: TEE WITHOUT CARDIOVERSION: SHX5443

## 2016-07-04 LAB — CBC
HCT: 29.3 % — ABNORMAL LOW (ref 36.0–46.0)
Hemoglobin: 9.6 g/dL — ABNORMAL LOW (ref 12.0–15.0)
MCH: 28.7 pg (ref 26.0–34.0)
MCHC: 32.8 g/dL (ref 30.0–36.0)
MCV: 87.5 fL (ref 78.0–100.0)
Platelets: 256 10*3/uL (ref 150–400)
RBC: 3.35 MIL/uL — ABNORMAL LOW (ref 3.87–5.11)
RDW: 14.5 % (ref 11.5–15.5)
WBC: 6.1 10*3/uL (ref 4.0–10.5)

## 2016-07-04 LAB — BASIC METABOLIC PANEL
ANION GAP: 5 (ref 5–15)
BUN: 19 mg/dL (ref 6–20)
CHLORIDE: 105 mmol/L (ref 101–111)
CO2: 23 mmol/L (ref 22–32)
Calcium: 9.2 mg/dL (ref 8.9–10.3)
Creatinine, Ser: 1.73 mg/dL — ABNORMAL HIGH (ref 0.44–1.00)
GFR calc non Af Amer: 30 mL/min — ABNORMAL LOW (ref >60)
GFR, EST AFRICAN AMERICAN: 35 mL/min — AB (ref >60)
GLUCOSE: 88 mg/dL (ref 65–99)
POTASSIUM: 4.2 mmol/L (ref 3.5–5.1)
Sodium: 133 mmol/L — ABNORMAL LOW (ref 135–145)

## 2016-07-04 SURGERY — ECHOCARDIOGRAM, TRANSESOPHAGEAL
Anesthesia: Moderate Sedation

## 2016-07-04 MED ORDER — SODIUM CHLORIDE 0.9 % IV SOLN
INTRAVENOUS | Status: DC
  Administered 2016-07-04: 10:00:00 via INTRAVENOUS

## 2016-07-04 MED ORDER — MAGNESIUM HYDROXIDE 400 MG/5ML PO SUSP
15.0000 mL | Freq: Once | ORAL | Status: AC
  Administered 2016-07-04: 15 mL via ORAL
  Filled 2016-07-04: qty 30

## 2016-07-04 MED ORDER — FENTANYL CITRATE (PF) 100 MCG/2ML IJ SOLN
INTRAMUSCULAR | Status: AC
  Filled 2016-07-04: qty 2

## 2016-07-04 MED ORDER — DIGOXIN 125 MCG PO TABS
0.0625 mg | ORAL_TABLET | Freq: Every day | ORAL | Status: DC
  Administered 2016-07-04 – 2016-07-05 (×2): 0.0625 mg via ORAL
  Filled 2016-07-04 (×2): qty 1

## 2016-07-04 MED ORDER — FENTANYL CITRATE (PF) 100 MCG/2ML IJ SOLN
INTRAMUSCULAR | Status: DC | PRN
  Administered 2016-07-04: 12.5 ug via INTRAVENOUS

## 2016-07-04 MED ORDER — HEPARIN SODIUM (PORCINE) 5000 UNIT/ML IJ SOLN
5000.0000 [IU] | Freq: Three times a day (TID) | INTRAMUSCULAR | Status: DC
  Administered 2016-07-04 – 2016-07-05 (×3): 5000 [IU] via SUBCUTANEOUS
  Filled 2016-07-04 (×3): qty 1

## 2016-07-04 MED ORDER — DIPHENHYDRAMINE HCL 50 MG/ML IJ SOLN
INTRAMUSCULAR | Status: AC
Start: 2016-07-04 — End: 2016-07-04
  Filled 2016-07-04: qty 1

## 2016-07-04 MED ORDER — MIDAZOLAM HCL 10 MG/2ML IJ SOLN
INTRAMUSCULAR | Status: DC | PRN
  Administered 2016-07-04: 1 mg via INTRAVENOUS
  Administered 2016-07-04: 2 mg via INTRAVENOUS

## 2016-07-04 MED ORDER — MIDAZOLAM HCL 5 MG/ML IJ SOLN
INTRAMUSCULAR | Status: AC
  Filled 2016-07-04: qty 2

## 2016-07-04 MED ORDER — ATORVASTATIN CALCIUM 40 MG PO TABS
40.0000 mg | ORAL_TABLET | Freq: Every day | ORAL | Status: DC
  Administered 2016-07-04: 40 mg via ORAL
  Filled 2016-07-04: qty 1

## 2016-07-04 MED ORDER — DIPHENHYDRAMINE HCL 50 MG/ML IJ SOLN
INTRAMUSCULAR | Status: DC | PRN
  Administered 2016-07-04: 50 mg via INTRAVENOUS

## 2016-07-04 MED ORDER — BUTAMBEN-TETRACAINE-BENZOCAINE 2-2-14 % EX AERO
INHALATION_SPRAY | CUTANEOUS | Status: DC | PRN
  Administered 2016-07-04: 2 via TOPICAL

## 2016-07-04 NOTE — Progress Notes (Signed)
Echocardiogram Echocardiogram Transesophageal has been performed.  Caroline [name] 07/04/2016, 10:34 AM

## 2016-07-04 NOTE — Progress Notes (Addendum)
Patient ID: Caroline [name], female   DOB: [birth year], 65 y.o.   MRN: [id number]    SUBJECTIVE: Drowsy this morning.  No chest pain, no dyspnea at rest.  Has not been out of bed yet.   LHC/RHC (8/31):  80% OM1, 95% D2 RA mean 4 PA 32/9, mean 22 Mean PCWP 10 CI 2.7 Fick, 2.08 thermo  Scheduled Meds: . aspirin EC  81 mg Oral Daily  . atorvastatin  40 mg Oral q1800  . clonazePAM  2 mg Oral BID  . clopidogrel  75 mg Oral Daily  . digoxin  0.0625 mg Oral Daily  . heparin subcutaneous  5,000 Units Subcutaneous Q8H  . QUEtiapine  300 mg Oral BID  . QUEtiapine  400 mg Oral QHS  . risperiDONE  2 mg Oral TID  . sodium chloride flush  3 mL Intravenous Q12H  . sodium chloride flush  3 mL Intravenous Q12H  . sodium chloride flush  3 mL Intravenous Q12H   Continuous Infusions:  PRN Meds:.sodium chloride, sodium chloride, acetaminophen, ALPRAZolam, alum & mag hydroxide-simeth, nitroGLYCERIN, ondansetron (ZOFRAN) IV, sodium chloride flush, sodium chloride flush, sodium chloride flush    Vitals:   07/03/16 2105 07/03/16 2143 07/03/16 2341 07/04/16 0453  BP: 104/64 (!) 99/54 (!) 94/52 (!) 90/56  Pulse: 87 84 83 79  Resp: 16   20  Temp: 97.7 F (36.5 C)   97.7 F (36.5 C)  TempSrc: Oral   Oral  SpO2: 100%   100%  Weight:    123 lb 9.6 oz (56.1 kg)  Height:        Intake/Output Summary (Last 24 hours) at 07/04/16 0758 Last data filed at 07/04/16 0400  Gross per 24 hour  Intake           856.92 ml  Output             1950 ml  Net         -1093.08 ml    LABS: Basic Metabolic Panel:  Recent Labs  07/02/16 1953 07/03/16 0336 07/04/16 0419  NA 129* 130* 133*  K 4.7 4.4 4.2  CL 99* 99* 105  CO2 22 24 23   GLUCOSE 86 85 88  BUN 19 24* 19  CREATININE 2.12* 2.15* 1.73*  CALCIUM 9.7 9.5 9.2  MG 1.8  --   --    Liver Function Tests:  Recent Labs  07/02/16 1953  AST 14*  ALT 9*  ALKPHOS 61  BILITOT 0.5  PROT 6.4*  ALBUMIN 3.5   No results for input(s): LIPASE, AMYLASE  in the last 72 hours. CBC:  Recent Labs  07/02/16 1953 07/04/16 0419  WBC 6.7 6.1  NEUTROABS 4.2  --   HGB 10.5* 9.6*  HCT 31.9* 29.3*  MCV 86.4 87.5  PLT 262 256   Cardiac Enzymes: No results for input(s): CKTOTAL, CKMB, CKMBINDEX, TROPONINI in the last 72 hours. BNP: Invalid input(s): POCBNP D-Dimer: No results for input(s): DDIMER in the last 72 hours. Hemoglobin A1C: No results for input(s): HGBA1C in the last 72 hours. Fasting Lipid Panel: No results for input(s): CHOL, HDL, LDLCALC, TRIG, CHOLHDL, LDLDIRECT in the last 72 hours. Thyroid Function Tests:  Recent Labs  07/02/16 1953  TSH 2.385   Anemia Panel: No results for input(s): VITAMINB12, FOLATE, FERRITIN, TIBC, IRON, RETICCTPCT in the last 72 hours.  RADIOLOGY: No results found.  PHYSICAL EXAM General: NAD Neck: No JVD, no thyromegaly or thyroid nodule.  Lungs: Clear to auscultation bilaterally with  normal respiratory effort. CV: lateral PMI.  Heart regular S1/S2, no S3/S4, 2/6 HSM HSM.  No peripheral edema.    Abdomen: Soft, nontender, no hepatosplenomegaly, no distention.  Neurologic: Alert and oriented x 3.  Psych: Normal affect. Extremities: No clubbing or cyanosis.   TELEMETRY: Reviewed telemetry pt in NSR  ASSESSMENT AND PLAN: 65 yo with history of orthostatic hypotension, depression, CAD, CKD (Bright's disease) and cardiomyopathy (out of proportion to CAD) was admitted for right and left heart cath.  She was found to have severe stenosis in OM2 and D2.  Also has severe MR by echo.  1. Chronic systolic CHF: Filling pressures optimized by RHC.  Cardiac output relatively preserved by Fick but low by thermodilution (CI 2.08).  She has chronic exertional dyspnea.  Last echo reviewed, EF 25%, moderate LV dilation, severe MR (?functional).  Cardiomyopathy does not appear to be fully explained by degree of coronary disease. BP has been marginal historically and not able to tolerate BP-active meds.  -  Filling pressures ok on RHC, does not need diuresis at this time.  - No BP room for ACEI/BB. - Creatinine 1.7 today.  I will try her on low dose digoxin 0.0625 mg daily.  Need to follow level closely if we use this medication.  - Could MR be worsening her symptoms? Plan for TEE today to evaluate.  If purely functional MR, not clear that fixing will help her long-term.  2. CAD: No chest pain, but noted to have OM1 and D2 severe stenosis.  Does not explain cardiomyopathy.  Not fixed yesterday to limit contrast.  These lesions could be fixed in future => can consider CABG-MV repair but not sure she would be a good candidate for this.  - Continue ASA/Plavix, add statin.  Not sure why she is not on one.  3. Mitral regurgitation: Severe by recent TTE.  May be functional given LV dilation.  Not clear that fixing functional MR will be beneficial.  Will do TEE today, if there appears to be valvular abnormality as well, would discuss with cardiac surgeon and also consider Mitraclip.  4. Anemia: Stable.  5. Orthostatic hypotension: Has limited cardiac med titration. Concern for adrenal insufficiency in the past but endocrinology apparently has thought more likely due to psychotropic meds.  She is not on steroids.  Needs to ambulate today, will consult PT/rehab.   6. Depression: Significant.  She is on multiple psychotropic meds.  7. CKD: Stage III.  Brights disease.  Creatinine stable at 1.7.   Loralie Champagne 07/04/2016 8:14 AM  TEE done this morning.  The mitral valve looked structurally normal but there was severe central mitral regurgitation, likely due to annular dilatation (functional MR).    I think she would be high risk for surgery with cardiomyopathy (EF 25-30%), and repair of functional MR is controversial.  Given her normal filling pressures, I would hold off on considering mitral valve surgery.  Mitraclip may be a reasonable option in the future.    She will need to ambulate today.  Possibly home  later today versus tomorrow depending on how she does getting out of bed. I will have her followup with me in CHF clinic.   Loralie Champagne 07/04/2016 10:40 AM

## 2016-07-04 NOTE — H&P (View-Only) (Signed)
Patient ID: Caroline [name], female   DOB: [birth year], 65 y.o.   MRN: [id number]    SUBJECTIVE: Drowsy this morning.  No chest pain, no dyspnea at rest.  Has not been out of bed yet.   LHC/RHC (8/31):  80% OM1, 95% D2 RA mean 4 PA 32/9, mean 22 Mean PCWP 10 CI 2.7 Fick, 2.08 thermo  Scheduled Meds: . aspirin EC  81 mg Oral Daily  . atorvastatin  40 mg Oral q1800  . clonazePAM  2 mg Oral BID  . clopidogrel  75 mg Oral Daily  . digoxin  0.0625 mg Oral Daily  . heparin subcutaneous  5,000 Units Subcutaneous Q8H  . QUEtiapine  300 mg Oral BID  . QUEtiapine  400 mg Oral QHS  . risperiDONE  2 mg Oral TID  . sodium chloride flush  3 mL Intravenous Q12H  . sodium chloride flush  3 mL Intravenous Q12H  . sodium chloride flush  3 mL Intravenous Q12H   Continuous Infusions:  PRN Meds:.sodium chloride, sodium chloride, acetaminophen, ALPRAZolam, alum & mag hydroxide-simeth, nitroGLYCERIN, ondansetron (ZOFRAN) IV, sodium chloride flush, sodium chloride flush, sodium chloride flush    Vitals:   07/03/16 2105 07/03/16 2143 07/03/16 2341 07/04/16 0453  BP: 104/64 (!) 99/54 (!) 94/52 (!) 90/56  Pulse: 87 84 83 79  Resp: 16   20  Temp: 97.7 F (36.5 C)   97.7 F (36.5 C)  TempSrc: Oral   Oral  SpO2: 100%   100%  Weight:    123 lb 9.6 oz (56.1 kg)  Height:        Intake/Output Summary (Last 24 hours) at 07/04/16 0758 Last data filed at 07/04/16 0400  Gross per 24 hour  Intake           856.92 ml  Output             1950 ml  Net         -1093.08 ml    LABS: Basic Metabolic Panel:  Recent Labs  07/02/16 1953 07/03/16 0336 07/04/16 0419  NA 129* 130* 133*  K 4.7 4.4 4.2  CL 99* 99* 105  CO2 22 24 23   GLUCOSE 86 85 88  BUN 19 24* 19  CREATININE 2.12* 2.15* 1.73*  CALCIUM 9.7 9.5 9.2  MG 1.8  --   --    Liver Function Tests:  Recent Labs  07/02/16 1953  AST 14*  ALT 9*  ALKPHOS 61  BILITOT 0.5  PROT 6.4*  ALBUMIN 3.5   No results for input(s): LIPASE, AMYLASE  in the last 72 hours. CBC:  Recent Labs  07/02/16 1953 07/04/16 0419  WBC 6.7 6.1  NEUTROABS 4.2  --   HGB 10.5* 9.6*  HCT 31.9* 29.3*  MCV 86.4 87.5  PLT 262 256   Cardiac Enzymes: No results for input(s): CKTOTAL, CKMB, CKMBINDEX, TROPONINI in the last 72 hours. BNP: Invalid input(s): POCBNP D-Dimer: No results for input(s): DDIMER in the last 72 hours. Hemoglobin A1C: No results for input(s): HGBA1C in the last 72 hours. Fasting Lipid Panel: No results for input(s): CHOL, HDL, LDLCALC, TRIG, CHOLHDL, LDLDIRECT in the last 72 hours. Thyroid Function Tests:  Recent Labs  07/02/16 1953  TSH 2.385   Anemia Panel: No results for input(s): VITAMINB12, FOLATE, FERRITIN, TIBC, IRON, RETICCTPCT in the last 72 hours.  RADIOLOGY: No results found.  PHYSICAL EXAM General: NAD Neck: No JVD, no thyromegaly or thyroid nodule.  Lungs: Clear to auscultation bilaterally with  normal respiratory effort. CV: lateral PMI.  Heart regular S1/S2, no S3/S4, 2/6 HSM HSM.  No peripheral edema.    Abdomen: Soft, nontender, no hepatosplenomegaly, no distention.  Neurologic: Alert and oriented x 3.  Psych: Normal affect. Extremities: No clubbing or cyanosis.   TELEMETRY: Reviewed telemetry pt in NSR  ASSESSMENT AND PLAN: 65 yo with history of orthostatic hypotension, depression, CAD, CKD (Bright's disease) and cardiomyopathy (out of proportion to CAD) was admitted for right and left heart cath.  She was found to have severe stenosis in OM2 and D2.  Also has severe MR by echo.  1. Chronic systolic CHF: Filling pressures optimized by RHC.  Cardiac output relatively preserved by Fick but low by thermodilution (CI 2.08).  She has chronic exertional dyspnea.  Last echo reviewed, EF 25%, moderate LV dilation, severe MR (?functional).  Cardiomyopathy does not appear to be fully explained by degree of coronary disease. BP has been marginal historically and not able to tolerate BP-active meds.  -  Filling pressures ok on RHC, does not need diuresis at this time.  - No BP room for ACEI/BB. - Creatinine 1.7 today.  I will try her on low dose digoxin 0.0625 mg daily.  Need to follow level closely if we use this medication.  - Could MR be worsening her symptoms? Plan for TEE today to evaluate.  If purely functional MR, not clear that fixing will help her long-term.  2. CAD: No chest pain, but noted to have OM1 and D2 severe stenosis.  Does not explain cardiomyopathy.  Not fixed yesterday to limit contrast.  These lesions could be fixed in future => can consider CABG-MV repair but not sure she would be a good candidate for this.  - Continue ASA/Plavix, add statin.  Not sure why she is not on one.  3. Mitral regurgitation: Severe by recent TTE.  May be functional given LV dilation.  Not clear that fixing functional MR will be beneficial.  Will do TEE today, if there appears to be valvular abnormality as well, would discuss with cardiac surgeon and also consider Mitraclip.  4. Anemia: Stable.  5. Orthostatic hypotension: Has limited cardiac med titration. Concern for adrenal insufficiency in the past but endocrinology apparently has thought more likely due to psychotropic meds.  She is not on steroids.  Needs to ambulate today, will consult PT/rehab.   6. Depression: Significant.  She is on multiple psychotropic meds.  7. CKD: Stage III.  Brights disease.  Creatinine stable at 1.7.   Loralie Champagne 07/04/2016 8:14 AM

## 2016-07-04 NOTE — Progress Notes (Signed)
Discussed with family. They are unable to pick patient up this evening.     Plan for likely d/c in am.    Beryle Beams" [place], PA-C 07/04/2016 1:26 PM

## 2016-07-04 NOTE — Evaluation (Addendum)
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 Admitted on 07/02/16 for right and left heart cath and consideration for ICD placement.   Clinical Impression  Patient evaluated by Physical Therapy with no further acute PT needs identified. Patient on room air with SaO2 97% at rest and 93% while walking. PT is signing off. Thank you for this referral.     Follow Up Recommendations No PT follow up    Equipment Recommendations  None recommended by PT    Recommendations for Other Services       Precautions / Restrictions Precautions Precautions: None      Mobility  Bed Mobility Overal bed mobility: Modified Independent             General bed mobility comments: incr time; bradykinesia  Transfers Overall transfer level: Independent Equipment used: None                Ambulation/Gait Ambulation/Gait assistance: Independent Ambulation Distance (Feet): 250 Feet Assistive device: None Gait Pattern/deviations: Step-through pattern;Decreased stride length   Gait velocity interpretation: Below normal speed for age/gender    Stairs Stairs: Yes Stairs assistance: Modified independent (Device/Increase time) Stair Management: One rail Left;Alternating pattern;Step to pattern;Forwards Number of Stairs: 10 General stair comments: alternating steps ascending; step-to descending  Wheelchair Mobility    Modified Rankin (Stroke Patients Only)       Balance Overall balance assessment: Independent                                           Pertinent Vitals/Pain See vitals flow sheet.  Pain Assessment: No/denies pain    Home Living Family/patient expects to be  discharged to:: Private residence Living Arrangements: Alone Available Help at Discharge: Friend(s);Available PRN/intermittently Type of Home: House Home Access: Stairs to enter Entrance Stairs-Rails: Doctor, general practice of Steps: 6 Home Layout: Two level (bedroom upstairs; often sleeps on couch downstairs) Home Equipment: Walker - 2 wheels;Cane - single point;Bedside commode;Wheelchair - manual      Prior Function Level of Independence: Needs assistance   Gait / Transfers Assistance Needed: independent with walking  ADL's / Homemaking Assistance Needed: assist with cleaning; friend does grocery shopping        Hand Dominance        Extremity/Trunk Assessment   Upper Extremity Assessment: Overall WFL for tasks assessed           Lower Extremity Assessment: Overall WFL for tasks assessed      Cervical / Trunk Assessment: Normal  Communication      Cognition Arousal/Alertness: Awake/alert Behavior During Therapy: Flat affect Overall Cognitive Status: Within Functional Limits for tasks assessed                      General Comments      Exercises        Assessment/Plan    PT Assessment Patent does not need any further PT services  PT Diagnosis     PT Problem List    PT Treatment Interventions     PT Goals (Current goals can be found in the Care Plan section) Acute Rehab  PT Goals PT Goal Formulation: All assessment and education complete, DC therapy    Frequency     Barriers to discharge        Co-evaluation               End of Session Equipment Utilized During Treatment: Gait belt Activity Tolerance: Patient tolerated treatment well Patient left: in chair;with call bell/phone within reach Nurse Communication: Mobility status (no PT needs)         Time: 1330-1405 PT Time Calculation (min) (ACUTE ONLY): 35 min   Charges:   PT Evaluation $PT Eval Low Complexity: 1 Procedure     PT G Codes:         Marise Knapper Jul 30, 2016, 2:24 PM  Pager [phone]

## 2016-07-04 NOTE — CV Procedure (Signed)
Procedure: TEE  Indication: Mitral regurgitation  Sedation:Versed 3 mg IV, Fentanyl 12.5 mcg IV, Benadryl 50 mg IV  Findings: Please see echo section for full report.  Moderately dilated LV with severe diffuse hypokinesis, EF 25-30%.  Normal RV size and systolic function. Moderate left atrial enlargement, no LA appendage thrombus.  Normal right atrium.  No ASD or PFO by color doppler.  Trivial TR.  Trileaflet aortic valve with mild calcification.  Mild aortic insufficiency, no stenosis.  There was severe central mitral regurgitation.  The mitral valve appeared structurally normal.  Suspect functional MR due to annular dilatation.  There was systolic blunting to slight reversal in the pulmonary vein doppler pattern.  Normal caliber aorta with grade III plaque in the descending thoracic aorta.   Impression:  Severe MR, probably functional due to annular dilatation.   Loralie Champagne 07/04/2016  10:36 AM

## 2016-07-04 NOTE — Interval H&P Note (Signed)
History and Physical Interval Note:  07/04/2016 10:09 AM  Caroline [name]  has presented today for surgery, with the diagnosis of mitral regurgitation  The various methods of treatment have been discussed with the patient and family. After consideration of risks, benefits and other options for treatment, the patient has consented to  Procedure(s): TRANSESOPHAGEAL ECHOCARDIOGRAM (TEE) (N/A) as a surgical intervention .  The patient's history has been reviewed, patient examined, no change in status, stable for surgery.  I have reviewed the patient's chart and labs.  Questions were answered to the patient's satisfaction.     Trissa Molina [employer]

## 2016-07-05 LAB — CBC
HCT: 31.8 % — ABNORMAL LOW (ref 36.0–46.0)
Hemoglobin: 10.2 g/dL — ABNORMAL LOW (ref 12.0–15.0)
MCH: 28.4 pg (ref 26.0–34.0)
MCHC: 32.1 g/dL (ref 30.0–36.0)
MCV: 88.6 fL (ref 78.0–100.0)
PLATELETS: 267 10*3/uL (ref 150–400)
RBC: 3.59 MIL/uL — ABNORMAL LOW (ref 3.87–5.11)
RDW: 14.3 % (ref 11.5–15.5)
WBC: 5.3 10*3/uL (ref 4.0–10.5)

## 2016-07-05 LAB — BASIC METABOLIC PANEL
Anion gap: 8 (ref 5–15)
BUN: 15 mg/dL (ref 6–20)
CHLORIDE: 103 mmol/L (ref 101–111)
CO2: 22 mmol/L (ref 22–32)
CREATININE: 1.68 mg/dL — AB (ref 0.44–1.00)
Calcium: 9.5 mg/dL (ref 8.9–10.3)
GFR calc Af Amer: 36 mL/min — ABNORMAL LOW (ref >60)
GFR calc non Af Amer: 31 mL/min — ABNORMAL LOW (ref >60)
Glucose, Bld: 95 mg/dL (ref 65–99)
Potassium: 4.7 mmol/L (ref 3.5–5.1)
SODIUM: 133 mmol/L — AB (ref 135–145)

## 2016-07-05 MED ORDER — DIGOXIN 62.5 MCG PO TABS: 0.0625 mg | ORAL_TABLET | Freq: Every day | ORAL | 11 refills | Status: DC

## 2016-07-05 MED ORDER — ATORVASTATIN CALCIUM 40 MG PO TABS: 40.0000 mg | ORAL_TABLET | Freq: Every day | ORAL | 11 refills | Status: DC

## 2016-07-05 MED ORDER — ASPIRIN 81 MG PO TABS: 81.0000 mg | ORAL_TABLET | Freq: Every day | ORAL | Status: AC

## 2016-07-05 MED ORDER — NITROGLYCERIN 0.4 MG SL SUBL: 0.4000 mg | SUBLINGUAL_TABLET | SUBLINGUAL | 3 refills | Status: AC | PRN

## 2016-07-05 NOTE — Progress Notes (Signed)
[id number] Pt declined walk at this time. Gave her CHF booklet and reviewed zones. Encouraged daily weights and reviewed when to call MD with weight gain. Gave low sodium diets and discussed 2000 mg sodium restriction. Discussed CRP but pt declined at this time. Encouraged pt to increase activity slowly. Had some difficulty with teach back and will need re enforcement of ed. Encouraged her to read over materials. Graylon Good RN BSN 07/05/2016 12:09 PM

## 2016-07-05 NOTE — Progress Notes (Signed)
Primary cardiologist: Dr. Loralie Champagne  Seen for followup: Cardiomyopathy, mitral regurgitation  Subjective:    No complaints of shortness of breath this morning. States she has ambulated. No palpitations.  Objective:   Temp:  [97.3 F (36.3 C)-98.7 F (37.1 C)] 98.7 F (37.1 C) (09/02 0445) Pulse Rate:  [74-83] 74 (09/02 0445) Resp:  [14-18] 14 (09/02 0445) BP: (71-95)/(39-70) 90/51 (09/02 0445) SpO2:  [97 %-99 %] 97 % (09/02 0445) Weight:  [122 lb 1.6 oz (55.4 kg)] 122 lb 1.6 oz (55.4 kg) (09/02 0445) Last BM Date: 07/01/16  Filed Weights   07/04/16 0453 07/04/16 0941 07/05/16 0445  Weight: 123 lb 9.6 oz (56.1 kg) 123 lb (55.8 kg) 122 lb 1.6 oz (55.4 kg)    Intake/Output Summary (Last 24 hours) at 07/05/16 1045 Last data filed at 07/05/16 0900  Gross per 24 hour  Intake              600 ml  Output              750 ml  Net             -150 ml    Telemetry: Sinus rhythm and sinus tachycardia.  Exam:  General: No distress, lying supine in bed.  Lungs: Clear, nonlabored.  Cardiac: RRR with 2/6 systolic murmur at the apex.  Abdomen: NABS.  Extremities: Pitting edema.  Lab Results:  Basic Metabolic Panel:  Recent Labs Lab 07/02/16 1953 07/03/16 0336 07/04/16 0419 07/05/16 0240  NA 129* 130* 133* 133*  K 4.7 4.4 4.2 4.7  CL 99* 99* 105 103  CO2 22 24 23 22   GLUCOSE 86 85 88 95  BUN 19 24* 19 15  CREATININE 2.12* 2.15* 1.73* 1.68*  CALCIUM 9.7 9.5 9.2 9.5  MG 1.8  --   --   --     Liver Function Tests:  Recent Labs Lab 07/02/16 1953  AST 14*  ALT 9*  ALKPHOS 61  BILITOT 0.5  PROT 6.4*  ALBUMIN 3.5    CBC:  Recent Labs Lab 07/02/16 1953 07/04/16 0419 07/05/16 0240  WBC 6.7 6.1 5.3  HGB 10.5* 9.6* 10.2*  HCT 31.9* 29.3* 31.8*  MCV 86.4 87.5 88.6  PLT 262 256 267   Cardiac catheterization 07/03/2016:  There is no aortic valve stenosis.  1st Mrg lesion, 80 %stenosed just prior to its branch point  2nd Diag-1 lesion, 95  %stenosed just prior to the stented segment.  2nd Diag-2 overlapping stents, 35 %stenosed.  LV end diastolic pressure is normal.  Right heart catheter pressures are essentially normal. No pulmonary hypertension. Low/normal RV EDP, PCWP, RAP & RVP  Moderate to severely reduced output/index. No large V wave from potential MR    Patient has severe 2 vessel disease with recurrent pre-and in-stent restenosis in the diagonal branch as well as a new lesion in the Circumflex-OM branch. Severely reduced LV function/EF and cardiac index are out of proportion with extent of CAD. 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  Medications:   Scheduled Medications: . aspirin EC  81 mg Oral Daily  . atorvastatin  40 mg Oral q1800  . clonazePAM  2 mg Oral BID  . clopidogrel  75 mg Oral Daily  . digoxin  0.0625 mg Oral Daily  . heparin subcutaneous  5,000 Units Subcutaneous Q8H  . QUEtiapine  300 mg Oral BID  . QUEtiapine  400 mg Oral QHS  . risperiDONE  2 mg Oral TID  .  sodium chloride flush  3 mL Intravenous Q12H  . sodium chloride flush  3 mL Intravenous Q12H  . sodium chloride flush  3 mL Intravenous Q12H     PRN Medications:  sodium chloride, sodium chloride, acetaminophen, ALPRAZolam, alum & mag hydroxide-simeth, nitroGLYCERIN, ondansetron (ZOFRAN) IV, sodium chloride flush, sodium chloride flush, sodium chloride flush   Assessment:   1. Chronic systolic heart failure with LVEF approximately 25%. Adequate filling pressures by right heart catheterization. She is not on diuretics at this time. Low blood pressure limits use of ACE inhibitor or beta blocker. She is on Lanoxin at this time.  2. 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 Felt to be very high risk for surgery and therefore conservative management at this time. Mitraclip might be an option later.  3. CKD stage 3, creatinine 1.6. Has history of Bright's disease.  4. Significant  depression on psychotropic therapy.  5. CAD with OM1 and D2 stenoses, not explanation for cardiomyopathy. In the absence of angina symptoms regressed was a she is not planned at this time. She is on aspirin and Plavix as well as statin therapy.   Plan/Discussion:    As per Dr. Claris Gladden note, plan is for discharge home today with close follow-up. Medical therapy is limited as outlined above. No specific additions being made today. Would arrange office visit in the Advanced Heart Failure clinic within 7 days.    Satira Sark, M.D., F.A.C.C.

## 2016-07-05 NOTE — Progress Notes (Signed)
Patient alert and oriented, denies pain. Iv and tele d/c. D/c instruction explain and given to the patient and family. All questions answered, pt. Verbalized understanding. D/c patient home per order.

## 2016-07-05 NOTE — Progress Notes (Signed)
Informed by nurse that pt's pharmacy is now closed and needs paper RX for another local pharmacy. She would still like the other ones pending at her other pharmacy so I handwrote 30 day rx for the requested scripts of atorvastatin, NTG, and digoxin. Tucker Steedley PA-C

## 2016-07-05 NOTE — Care Management (Signed)
CM met with patient at bedside concerning medication assistance. Patient has Caroline [name] Medicare listed as health insurance. Patient reports not having a drug prescription plan. Pt pharmacy was confirmed as [facility].CM attempted to contact pharmacy but closed today.  Discussed Goodrx plans patient agreeable. CM printed coupons for discharge prescription reviewed prices, she requested [facility].patient was agreeable and coupons handed to patient. Updated Taiwo RN.No further CM needs identified

## 2016-07-05 NOTE — Discharge Summary (Signed)
Discharge Summary    Patient ID: Caroline [name],  MRN: [id number], DOB/AGE: [birth year] 65 y.o.  Admit date: 07/02/2016 Discharge date: 07/05/2016  Primary Care Provider: [name] Primary Cardiologist: Dr Johnsie Cancel, Dr Aundra Dubin  Discharge Diagnoses    Active Problems:   Acute on chronic systolic (congestive) heart failure (HCC)  Allergies No Known Allergies  Diagnostic Studies/Procedures    R/L heart cath: 08/31 RA mean 4 PA 32/9, mean 22 Mean PCWP 10 CI 2.7 Fick, 2.08 thermo  There is no aortic valve stenosis.  1st Mrg lesion, 80 %stenosed just prior to its branch point  2nd Diag-1 lesion, 95 %stenosed just prior to the stented segment.  2nd Diag-2 overlapping stents, 35 %stenosed.  LV end diastolic pressure is normal.  Right heart catheter pressures are essentially normal. No pulmonary hypertension. Low/normal RV EDP, PCWP, RAP & RVP  Moderate to severely reduced output/index. No large V wave from potential MR Patient has severe 2 vessel disease with recurrent pre-and in-stent restenosis in the diagonal branch as well as a new lesion in the Circumflex-OM branch. Severely reduced LV function/EF and cardiac index are out of proportion with extent of CAD. 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 Plan:  Return to nursing unit for post catheterization care.  Would hold off on further IV diuresis. Need to determine mitral regurgitation status in order to decide whether or not the patient would require redo PCI on 2 lesions versus CABG with MVR.  09/01 TEE Findings: Please see echo section for full report.  Moderately dilated LV with severe diffuse hypokinesis, EF 25-30%.  Normal RV size and systolic function. Moderate left atrial enlargement, no LA appendage thrombus.  Normal right atrium.  No ASD or PFO by color doppler.  Trivial TR.  Trileaflet aortic valve with mild calcification.  Mild aortic insufficiency, no stenosis.  There was  severe central mitral regurgitation.  The mitral valve appeared structurally normal.  Suspect functional MR due to annular dilatation.  There was systolic blunting to slight reversal in the pulmonary vein doppler pattern.  Normal caliber aorta with grade III plaque in the descending thoracic aorta.      Impression:  Severe MR, probably functional due to annular dilatation.  _____________   History of Present Illness     65 y.o. 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 EF was lower and MR severe, R/L heart cath and EP referral recommended. She was seen by Dr Caryl Comes on 08/30. She came in for the cath on 08/30 pm for hydration.  Hospital Course     Consultants: None  She had the R/L heart cath, results above. A TEE was indicated to determine if CABG/MVR was needed.  Dr Aundra Dubin did the TEE and reviewed all data: "I think she would be high risk for surgery with cardiomyopathy (EF 25-30%), and repair of functional MR is controversial.  Given her normal filling pressures, I would hold off on considering mitral valve surgery.  Mitraclip may be a reasonable option in the future." He will follow her as an outpatient.  Pt seen by Dr Domenic Polite on 09/02, not on diuretics right now, BP limits ACE and BB. She is on ASA, Plavix, and statin. She was seen by cardiac rehab  and educated on heart-healthy lifestyle modifications. She was ambulating without chest pain or SOB and considered stable for discharge, to follow up as an outpatient. _____________  Discharge Vitals Blood pressure (!) 97/47, pulse 81, temperature 97.7 F (36.5 C), temperature source Oral, resp. rate 20, height 5\' 3"  (1.6 m), weight 122 lb 1.6 oz (55.4 kg), SpO2 100 %.  Filed Weights   07/04/16  0453 07/04/16 0941 07/05/16 0445  Weight: 123 lb 9.6 oz (56.1 kg) 123 lb (55.8 kg) 122 lb 1.6 oz (55.4 kg)    Labs & Radiologic Studies    CBC  Recent Labs  07/02/16 1953 07/04/16 0419 07/05/16 0240  WBC 6.7 6.1 5.3  NEUTROABS 4.2  --   --   HGB 10.5* 9.6* 10.2*  HCT 31.9* 29.3* 31.8*  MCV 86.4 87.5 88.6  PLT 262 256 [id number]   Basic Metabolic Panel  Recent Labs  07/02/16 1953  07/04/16 0419 07/05/16 0240  NA 129*  < > 133* 133*  K 4.7  < > 4.2 4.7  CL 99*  < > 105 103  CO2 22  < > 23 22  GLUCOSE 86  < > 88 95  BUN 19  < > 19 15  CREATININE 2.12*  < > 1.73* 1.68*  CALCIUM 9.7  < > 9.2 9.5  MG 1.8  --   --   --   < > = values in this interval not displayed. Liver Function Tests  Recent Labs  07/02/16 1953  AST 14*  ALT 9*  ALKPHOS 61  BILITOT 0.5  PROT 6.4*  ALBUMIN 3.5   Thyroid Function Tests  Recent Labs  07/02/16 1953  TSH 2.385   _____________   Disposition   Pt is being discharged home today in good condition.  Follow-up Plans & Appointments    Follow-up Information    Loralie Champagne, MD Follow up on 07/16/2016.   Specialty:  Cardiology Why:  at 1030 for post hospital follow up. Please bring all of your medications to your visit. The code for parking is 0030. Contact information: [place] [address] [phone]          Discharge Instructions    Diet - low sodium heart healthy    Complete by:  As directed   Increase activity slowly    Complete by:  As directed     Discharge Medications   Current Discharge Medication List    START taking these medications   Details  atorvastatin (LIPITOR) 40 MG tablet Take 1 tablet (40 mg total) by mouth daily at 6 PM. Qty: 30 tablet, Refills: 11    digoxin 62.5 MCG TABS Take 0.0625 mg by mouth daily. Qty: 30 tablet, Refills: 11    nitroGLYCERIN (NITROSTAT) 0.4 MG SL tablet Place 1 tablet (0.4 mg total) under the tongue every 5 (five) minutes as needed for  chest pain. Qty: 25 tablet, Refills: 3      CONTINUE these medications which have CHANGED   Details  aspirin 81 MG tablet Take 1 tablet (81 mg total) by mouth daily.      CONTINUE these medications which have NOT CHANGED   Details  Black Cohosh 540 MG CAPS Take 540 mg by mouth 2 (two) times daily.    clonazePAM (KLONOPIN) 1 MG tablet Take 2 mg by mouth 2 (two) times daily.     clopidogrel (PLAVIX) 75 MG tablet TAKE 1 TABLET BY MOUTH DAILY. PLEASE SCHEDULE APPOINTMENT FOR REFILLS Qty:  30 tablet, Refills: 2    QUEtiapine (SEROQUEL XR) 300 MG 24 hr tablet Take 300 mg by mouth 2 (two) times daily.    QUEtiapine (SEROQUEL) 400 MG tablet Take 400 mg by mouth at bedtime.    risperiDONE (RISPERDAL) 2 MG tablet Take 2 mg by mouth 3 (three) times daily.     HYDROcodone-acetaminophen (NORCO/VICODIN) 5-325 MG tablet Take 1 tablet by mouth every 8 (eight) hours as needed for moderate pain. Qty: 60 tablet, Refills: 0         Outstanding Labs/Studies   None  Duration of Discharge Encounter   Greater than 30 minutes including physician time.  SignedRosaria Ferries NP 07/05/2016, 1:16 PM

## 2016-07-06 ENCOUNTER — Encounter (HOSPITAL_COMMUNITY): Payer: Self-pay | Admitting: Cardiology

## 2016-07-08 ENCOUNTER — Encounter: Payer: Self-pay | Admitting: Physician Assistant

## 2016-07-08 ENCOUNTER — Telehealth: Payer: Self-pay | Admitting: Behavioral Health

## 2016-07-08 ENCOUNTER — Ambulatory Visit: Payer: Medicare Other | Admitting: Physician Assistant

## 2016-07-08 ENCOUNTER — Ambulatory Visit (INDEPENDENT_AMBULATORY_CARE_PROVIDER_SITE_OTHER): Payer: Medicare Other | Admitting: Physician Assistant

## 2016-07-08 VITALS — BP 104/52 | HR 91 | Temp 98.6°F | Resp 16 | Ht 63.0 in | Wt 127.4 lb

## 2016-07-08 DIAGNOSIS — M545 Low back pain, unspecified: Secondary | ICD-10-CM

## 2016-07-08 DIAGNOSIS — I951 Orthostatic hypotension: Secondary | ICD-10-CM | POA: Diagnosis not present

## 2016-07-08 DIAGNOSIS — E785 Hyperlipidemia, unspecified: Secondary | ICD-10-CM | POA: Diagnosis not present

## 2016-07-08 DIAGNOSIS — G8929 Other chronic pain: Secondary | ICD-10-CM | POA: Diagnosis not present

## 2016-07-08 MED ORDER — HYDROCODONE-ACETAMINOPHEN 10-325 MG PO TABS: 1.0000 | ORAL_TABLET | Freq: Four times a day (QID) | ORAL | 0 refills | Status: DC | PRN

## 2016-07-08 NOTE — Patient Instructions (Signed)
Please take medications as directed.  You need to take the medications the Cardiologist prescribed for you. Schedule a follow-up with them ASAP.   I am glad you are feeling well. Take the pain medication as directed only if needed for severe pain. You will again be contacted for assessment by Pain Medicine. Do not cancel this appointment.

## 2016-07-08 NOTE — Telephone Encounter (Signed)
Transition Care Management Follow-up Telephone Call  Admit date: 07/02/2016 Discharge date: 07/05/2016  Primary Care Provider: [name] Primary Cardiologist: Dr Johnsie Cancel, Dr Aundra Dubin   How have you been since you were released from the hospital? Patient stated, "Fine".   Do you understand why you were in the hospital? yes   Do you understand the discharge instructions? yes   Where were you discharged to? Home with her daughter.   Items Reviewed:  Medications reviewed: yes  Allergies reviewed: yes, NKA  Dietary changes reviewed: yes, low sodium diet  Referrals reviewed: yes, follow-up with PCP and cardiologist   Functional Questionnaire:   Activities of Daily Living (ADLs):   She states they are independent in the following: ambulation, bathing and hygiene, feeding, continence, grooming, toileting and dressing States they require assistance with the following: None   Any transportation issues/concerns?: no   Any patient concerns? yes, patient voiced that she's concerned about going back to the hospital on 07/16/16 to have stents placed.   Confirmed importance and date/time of follow-up visits scheduled yes, 07/08/16 at 3:30 PM.  Provider Appointment booked with Brunetta Jeans, PA-C.  Confirmed with patient if condition begins to worsen call PCP or go to the ER.  Patient was given the office number and encouraged to call back with question or concerns.  : yes

## 2016-07-08 NOTE — Progress Notes (Signed)
Patient presents to clinic today for follow-up of orthostasis and hyperlipidemia.   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 Has stopped all steroids given by Endocrinology. Is followed by Cardiology for acute of chronic diastolic heart failure. Recently had cardiac catheterizations and will behaving ICD placement in the near future. Is currently on a regimen of Digoxin as other medications are being avoided due to low BP. Patient denies chest pain, palpitations, lightheadedness, dizziness, vision changes or frequent headaches.  BP Readings from Last 3 Encounters:  07/08/16 (!) 104/52  07/05/16 (!) 97/47  07/02/16 90/60   Patient with history of CAD and hyperlipidemia. Is prescribed Lipitor and Plavix by Cardiology. Recent catheterization. Is not taking medications as directed by Cardiology. States she just has not had time to pick up her medications.  Patient also FU for chronic low back pain 2/2 spondylolisthesis. Is taking medication as directed but notes sub-therapeutic. Denies new or worsening symptoms.   Past Medical History:  Diagnosis Date  . Angina   . Anxiety   . Arthritis    "severe in my back" (07/02/2016)  . Atrial fibrillation ([place])   . Bipolar disorder ([place])   . Bright disease   . CAD (coronary artery disease)    [facility]  Diagonal instent restenosis treated 10/2005  . Cancer of right breast ([place]) 01-Feb-2001  . Carotid artery occlusion   . CHF (congestive heart failure) ([place])    states she was told se had this in past by MD  . Chronic back pain   . Depression    February 01, 2002 had shock treatment due to depresson, spouse passed away Feb 02, 2000  . Dyslipidemia   . GERD (gastroesophageal reflux disease)   . Graves' disease    /notes 06/03/2010  . Heart murmur    states years ago  . Hypertension    states a long time ago-states she never took medication  . Non Q wave myocardial infarction ([place])  03/2004   Archie Endo 06/03/2010  . Shortness of breath    with exertion    Current Outpatient Prescriptions on File Prior to Visit  Medication Sig Dispense Refill  . Black Cohosh 540 MG CAPS Take 540 mg by mouth 2 (two) times daily.    . clonazePAM (KLONOPIN) 1 MG tablet Take 2 mg by mouth 2 (two) times daily.     . clopidogrel (PLAVIX) 75 MG tablet TAKE 1 TABLET BY MOUTH DAILY. PLEASE SCHEDULE APPOINTMENT FOR REFILLS 30 tablet 2  . QUEtiapine (SEROQUEL XR) 300 MG 24 hr tablet Take 300 mg by mouth 2 (two) times daily.    . QUEtiapine (SEROQUEL) 400 MG tablet Take 400 mg by mouth at bedtime.    . risperiDONE (RISPERDAL) 2 MG tablet Take 2 mg by mouth 3 (three) times daily.     [name] aspirin 81 MG tablet Take 1 tablet (81 mg total) by mouth daily. (Patient not taking: Reported on 07/08/2016)    . atorvastatin (LIPITOR) 40 MG tablet Take 1 tablet (40 mg total) by mouth daily at 6 PM. (Patient not taking: Reported on 07/08/2016) 30 tablet 11  . digoxin 62.5 MCG TABS Take 0.0625 mg by mouth daily. (Patient not taking: Reported on 07/08/2016) 30 tablet 11  . nitroGLYCERIN (NITROSTAT) 0.4 MG SL tablet Place 1 tablet (0.4 mg total) under the tongue every 5 (five) minutes as needed for chest pain. (Patient not taking: Reported on 07/08/2016) 25 tablet 3  No current facility-administered medications on file prior to visit.     No Known Allergies  Family History  Problem Relation Age of Onset  . Coronary artery disease Father   . Prostate cancer Father     not sure  . Pancreatic cancer Father     not sure  . Cancer Father     Pancreatic and  Prostate  . Heart disease Father     Before age 17 and BPG  . Hyperlipidemia Father   . Heart attack Father   . Hypertension Mother   . Diabetes Sister   . Colon cancer Neg Hx     Social History   Social History  . Marital status: Widowed    Spouse name: N/A  . Number of children: N/A  . Years of education: N/A   Social History Main Topics  . Smoking  status: Former Smoker    Packs/day: 0.75    Years: 49.00    Types: Cigarettes    Quit date: 08/04/2015  . Smokeless tobacco: Never Used  . Alcohol use No  . Drug use: No  . Sexual activity: No   Other Topics Concern  . None   Social History Narrative   She has 1 child.  She is on disability.    Review of Systems - See HPI.  All other ROS are negative.  BP (!) 104/52 (BP Location: Left Arm, Patient Position: Sitting, Cuff Size: Normal)   Pulse 91   Temp 98.6 F (37 C) (Oral)   Resp 16   Ht '5\' 3"'$  (1.6 m)   Wt 127 lb 6 oz (57.8 kg)   SpO2 98%   BMI 22.56 kg/m   Physical Exam  Constitutional: She is oriented to person, place, and time and well-developed, well-nourished, and in no distress.  HENT:  Head: Normocephalic and atraumatic.  Eyes: Conjunctivae are normal.  Cardiovascular: Normal rate, regular rhythm, normal heart sounds and intact distal pulses.   Pulmonary/Chest: Effort normal.  Neurological: She is alert and oriented to person, place, and time.  Skin: Skin is warm and dry. No rash noted.  Psychiatric: Affect normal.  Vitals reviewed.   Recent Results (from the past 2160 hour(s))  Basic Metabolic Panel (BMET)     Status: Abnormal   Collection Time: 06/18/16  2:25 PM  Result Value Ref Range   Sodium 134 (L) 135 - 146 mmol/L   Potassium 4.5 3.5 - 5.3 mmol/L   Chloride 98 98 - 110 mmol/L   CO2 26 20 - 31 mmol/L   Glucose, Bld 84 65 - 99 mg/dL   BUN 21 7 - 25 mg/dL   Creat 2.16 (H) 0.50 - 0.99 mg/dL    Comment:   For patients > or = 63 years of age: The upper reference limit for Creatinine is approximately 13% higher for people identified as African-American.      Calcium 9.4 8.6 - 10.4 mg/dL  B Nat Peptide     Status: Abnormal   Collection Time: 06/18/16  2:25 PM  Result Value Ref Range   Brain Natriuretic Peptide 275.8 (H) <100 pg/mL    Comment:   BNP levels increase with age in the general population with the highest values seen in individuals  greater than 77 years of age. Reference: Joellyn Rued Cardiol 2002; 76:195-09.     Basic Metabolic Panel (BMET)     Status: Abnormal   Collection Time: 06/25/16  2:14 PM  Result Value Ref Range   Sodium  134 (L) 135 - 146 mmol/L   Potassium 4.5 3.5 - 5.3 mmol/L   Chloride 98 98 - 110 mmol/L   CO2 25 20 - 31 mmol/L   Glucose, Bld 82 65 - 99 mg/dL   BUN 12 7 - 25 mg/dL   Creat 2.01 (H) 0.50 - 0.99 mg/dL    Comment:   For patients > or = 63 years of age: The upper reference limit for Creatinine is approximately 13% higher for people identified as African-American.      Calcium 9.4 8.6 - 10.4 mg/dL  Comprehensive metabolic panel     Status: Abnormal   Collection Time: 07/02/16  7:53 PM  Result Value Ref Range   Sodium 129 (L) 135 - 145 mmol/L   Potassium 4.7 3.5 - 5.1 mmol/L   Chloride 99 (L) 101 - 111 mmol/L   CO2 22 22 - 32 mmol/L   Glucose, Bld 86 65 - 99 mg/dL   BUN 19 6 - 20 mg/dL   Creatinine, Ser 2.12 (H) 0.44 - 1.00 mg/dL   Calcium 9.7 8.9 - 10.3 mg/dL   Total Protein 6.4 (L) 6.5 - 8.1 g/dL   Albumin 3.5 3.5 - 5.0 g/dL   AST 14 (L) 15 - 41 U/L   ALT 9 (L) 14 - 54 U/L   Alkaline Phosphatase 61 38 - 126 U/L   Total Bilirubin 0.5 0.3 - 1.2 mg/dL   GFR calc non Af Amer 24 (L) >60 mL/min   GFR calc Af Amer 27 (L) >60 mL/min    Comment: (NOTE) The eGFR has been calculated using the CKD EPI equation. This calculation has not been validated in all clinical situations. eGFR's persistently <60 mL/min signify possible Chronic Kidney Disease.    Anion gap 8 5 - 15  Magnesium     Status: None   Collection Time: 07/02/16  7:53 PM  Result Value Ref Range   Magnesium 1.8 1.7 - 2.4 mg/dL  TSH     Status: None   Collection Time: 07/02/16  7:53 PM  Result Value Ref Range   TSH 2.385 0.350 - 4.500 uIU/mL  CBC WITH DIFFERENTIAL     Status: Abnormal   Collection Time: 07/02/16  7:53 PM  Result Value Ref Range   WBC 6.7 4.0 - 10.5 K/uL   RBC 3.69 (L) 3.87 - 5.11 MIL/uL    Hemoglobin 10.5 (L) 12.0 - 15.0 g/dL   HCT 31.9 (L) 36.0 - 46.0 %   MCV 86.4 78.0 - 100.0 fL   MCH 28.5 26.0 - 34.0 pg   MCHC 32.9 30.0 - 36.0 g/dL   RDW 14.2 11.5 - 15.5 %   Platelets 262 150 - 400 K/uL   Neutrophils Relative % 62 %   Neutro Abs 4.2 1.7 - 7.7 K/uL   Lymphocytes Relative 27 %   Lymphs Abs 1.8 0.7 - 4.0 K/uL   Monocytes Relative 9 %   Monocytes Absolute 0.6 0.1 - 1.0 K/uL   Eosinophils Relative 1 %   Eosinophils Absolute 0.1 0.0 - 0.7 K/uL   Basophils Relative 1 %   Basophils Absolute 0.1 0.0 - 0.1 K/uL  Basic metabolic panel     Status: Abnormal   Collection Time: 07/03/16  3:36 AM  Result Value Ref Range   Sodium 130 (L) 135 - 145 mmol/L   Potassium 4.4 3.5 - 5.1 mmol/L   Chloride 99 (L) 101 - 111 mmol/L   CO2 24 22 - 32 mmol/L   Glucose,  Bld 85 65 - 99 mg/dL   BUN 24 (H) 6 - 20 mg/dL   Creatinine, Ser 2.15 (H) 0.44 - 1.00 mg/dL   Calcium 9.5 8.9 - 10.3 mg/dL   GFR calc non Af Amer 23 (L) >60 mL/min   GFR calc Af Amer 27 (L) >60 mL/min    Comment: (NOTE) The eGFR has been calculated using the CKD EPI equation. This calculation has not been validated in all clinical situations. eGFR's persistently <60 mL/min signify possible Chronic Kidney Disease.    Anion gap 7 5 - 15  Protime-INR     Status: None   Collection Time: 07/03/16  4:50 AM  Result Value Ref Range   Prothrombin Time 12.8 11.4 - 15.2 seconds   INR 0.96   I-STAT 3, arterial blood gas (G3+)     Status: Abnormal   Collection Time: 07/03/16  5:44 PM  Result Value Ref Range   pH, Arterial 7.301 (L) 7.350 - 7.450   pCO2 arterial 38.0 32.0 - 48.0 mmHg   pO2, Arterial 98.0 83.0 - 108.0 mmHg   Bicarbonate 18.8 (L) 20.0 - 28.0 mmol/L   TCO2 20 0 - 100 mmol/L   O2 Saturation 97.0 %   Acid-base deficit 7.0 (H) 0.0 - 2.0 mmol/L   Patient temperature HIDE    Sample type ARTERIAL   I-STAT 3, venous blood gas (G3P V)     Status: Abnormal   Collection Time: 07/03/16  5:48 PM  Result Value Ref Range     pH, Ven 7.307 7.250 - 7.430   pCO2, Ven 39.1 (L) 44.0 - 60.0 mmHg   pO2, Ven 35.0 32.0 - 45.0 mmHg   Bicarbonate 19.5 (L) 20.0 - 28.0 mmol/L   TCO2 21 0 - 100 mmol/L   O2 Saturation 62.0 %   Acid-base deficit 6.0 (H) 0.0 - 2.0 mmol/L   Patient temperature HIDE    Sample type VENOUS    Comment NOTIFIED PHYSICIAN   I-STAT 3, venous blood gas (G3P V)     Status: Abnormal   Collection Time: 07/03/16  6:02 PM  Result Value Ref Range   pH, Ven 7.298 7.250 - 7.430   pCO2, Ven 42.7 (L) 44.0 - 60.0 mmHg   pO2, Ven 36.0 32.0 - 45.0 mmHg   Bicarbonate 20.9 20.0 - 28.0 mmol/L   TCO2 22 0 - 100 mmol/L   O2 Saturation 62.0 %   Acid-base deficit 5.0 (H) 0.0 - 2.0 mmol/L   Patient temperature HIDE    Sample type VENOUS    Comment NOTIFIED PHYSICIAN   CBC     Status: Abnormal   Collection Time: 07/04/16  4:19 AM  Result Value Ref Range   WBC 6.1 4.0 - 10.5 K/uL   RBC 3.35 (L) 3.87 - 5.11 MIL/uL   Hemoglobin 9.6 (L) 12.0 - 15.0 g/dL   HCT 29.3 (L) 36.0 - 46.0 %   MCV 87.5 78.0 - 100.0 fL   MCH 28.7 26.0 - 34.0 pg   MCHC 32.8 30.0 - 36.0 g/dL   RDW 14.5 11.5 - 15.5 %   Platelets 256 150 - 400 K/uL  Basic metabolic panel     Status: Abnormal   Collection Time: 07/04/16  4:19 AM  Result Value Ref Range   Sodium 133 (L) 135 - 145 mmol/L   Potassium 4.2 3.5 - 5.1 mmol/L   Chloride 105 101 - 111 mmol/L   CO2 23 22 - 32 mmol/L   Glucose, Bld 88 65 -  99 mg/dL   BUN 19 6 - 20 mg/dL   Creatinine, Ser 1.73 (H) 0.44 - 1.00 mg/dL   Calcium 9.2 8.9 - 10.3 mg/dL   GFR calc non Af Amer 30 (L) >60 mL/min   GFR calc Af Amer 35 (L) >60 mL/min    Comment: (NOTE) The eGFR has been calculated using the CKD EPI equation. This calculation has not been validated in all clinical situations. eGFR's persistently <60 mL/min signify possible Chronic Kidney Disease.    Anion gap 5 5 - 15  Basic metabolic panel     Status: Abnormal   Collection Time: 07/05/16  2:40 AM  Result Value Ref Range   Sodium 133  (L) 135 - 145 mmol/L   Potassium 4.7 3.5 - 5.1 mmol/L   Chloride 103 101 - 111 mmol/L   CO2 22 22 - 32 mmol/L   Glucose, Bld 95 65 - 99 mg/dL   BUN 15 6 - 20 mg/dL   Creatinine, Ser 1.68 (H) 0.44 - 1.00 mg/dL   Calcium 9.5 8.9 - 10.3 mg/dL   GFR calc non Af Amer 31 (L) >60 mL/min   GFR calc Af Amer 36 (L) >60 mL/min    Comment: (NOTE) The eGFR has been calculated using the CKD EPI equation. This calculation has not been validated in all clinical situations. eGFR's persistently <60 mL/min signify possible Chronic Kidney Disease.    Anion gap 8 5 - 15  CBC     Status: Abnormal   Collection Time: 07/05/16  2:40 AM  Result Value Ref Range   WBC 5.3 4.0 - 10.5 K/uL   RBC 3.59 (L) 3.87 - 5.11 MIL/uL   Hemoglobin 10.2 (L) 12.0 - 15.0 g/dL   HCT 31.8 (L) 36.0 - 46.0 %   MCV 88.6 78.0 - 100.0 fL   MCH 28.4 26.0 - 34.0 pg   MCHC 32.1 30.0 - 36.0 g/dL   RDW 14.3 11.5 - 15.5 %   Platelets 267 150 - 400 K/uL    Assessment/Plan: 1. Hyperlipidemia Patient non-compliant with medications despite cardiac conditions. She is to pick up medications and begin as directed. She is to call to schedule FU with Cardiology as directed by specialist's last OV note.   2. Chronic low back pain Discussed pain management. Will place referral giving chronicity of pain and number of medical comorbidity. Think she would benefit for PT. Will give some thought.  Rx Norco to use sparingly for significant back pain.  3. Orthostasis BP at low end of normal. Has discontinued her medications. Asymptomatic. BP is higher than previously noted. Denies further assessment at present. Recommend she FU with Cardiology and Endocrinology.   [name], PA-C

## 2016-07-14 ENCOUNTER — Other Ambulatory Visit: Payer: Self-pay | Admitting: Cardiovascular Disease

## 2016-07-16 ENCOUNTER — Encounter (HOSPITAL_COMMUNITY): Payer: Self-pay | Admitting: *Deleted

## 2016-07-16 ENCOUNTER — Ambulatory Visit (HOSPITAL_COMMUNITY)
Admit: 2016-07-16 | Discharge: 2016-07-16 | Disposition: A | Discharge status: Home / Self Care | Payer: Medicare Other | Source: Ambulatory Visit | Attending: Cardiology | Admitting: Cardiology

## 2016-07-16 ENCOUNTER — Other Ambulatory Visit (HOSPITAL_COMMUNITY): Payer: Self-pay | Admitting: *Deleted

## 2016-07-16 ENCOUNTER — Encounter (HOSPITAL_COMMUNITY): Payer: Self-pay

## 2016-07-16 VITALS — BP 110/54 | HR 84 | Wt 124.5 lb

## 2016-07-16 DIAGNOSIS — I340 Nonrheumatic mitral (valve) insufficiency: Secondary | ICD-10-CM | POA: Insufficient documentation

## 2016-07-16 DIAGNOSIS — Z7902 Long term (current) use of antithrombotics/antiplatelets: Secondary | ICD-10-CM | POA: Diagnosis not present

## 2016-07-16 DIAGNOSIS — N183 Chronic kidney disease, stage 3 (moderate): Secondary | ICD-10-CM | POA: Diagnosis not present

## 2016-07-16 DIAGNOSIS — Z7982 Long term (current) use of aspirin: Secondary | ICD-10-CM | POA: Diagnosis not present

## 2016-07-16 DIAGNOSIS — Z79899 Other long term (current) drug therapy: Secondary | ICD-10-CM | POA: Diagnosis not present

## 2016-07-16 DIAGNOSIS — I5022 Chronic systolic (congestive) heart failure: Secondary | ICD-10-CM

## 2016-07-16 DIAGNOSIS — F329 Major depressive disorder, single episode, unspecified: Secondary | ICD-10-CM | POA: Insufficient documentation

## 2016-07-16 DIAGNOSIS — I951 Orthostatic hypotension: Secondary | ICD-10-CM | POA: Diagnosis not present

## 2016-07-16 DIAGNOSIS — I779 Disorder of arteries and arterioles, unspecified: Secondary | ICD-10-CM

## 2016-07-16 DIAGNOSIS — I255 Ischemic cardiomyopathy: Secondary | ICD-10-CM | POA: Diagnosis present

## 2016-07-16 DIAGNOSIS — N189 Chronic kidney disease, unspecified: Secondary | ICD-10-CM

## 2016-07-16 DIAGNOSIS — I2510 Atherosclerotic heart disease of native coronary artery without angina pectoris: Secondary | ICD-10-CM | POA: Diagnosis not present

## 2016-07-16 DIAGNOSIS — Z833 Family history of diabetes mellitus: Secondary | ICD-10-CM | POA: Insufficient documentation

## 2016-07-16 DIAGNOSIS — I739 Peripheral vascular disease, unspecified: Secondary | ICD-10-CM

## 2016-07-16 DIAGNOSIS — I6521 Occlusion and stenosis of right carotid artery: Secondary | ICD-10-CM | POA: Diagnosis not present

## 2016-07-16 DIAGNOSIS — Z8249 Family history of ischemic heart disease and other diseases of the circulatory system: Secondary | ICD-10-CM | POA: Diagnosis not present

## 2016-07-16 DIAGNOSIS — M545 Low back pain: Secondary | ICD-10-CM | POA: Diagnosis not present

## 2016-07-16 LAB — BASIC METABOLIC PANEL
Anion gap: 11 (ref 5–15)
BUN: 20 mg/dL (ref 6–20)
CALCIUM: 9.6 mg/dL (ref 8.9–10.3)
CHLORIDE: 97 mmol/L — AB (ref 101–111)
CO2: 22 mmol/L (ref 22–32)
CREATININE: 2.01 mg/dL — AB (ref 0.44–1.00)
GFR calc non Af Amer: 25 mL/min — ABNORMAL LOW (ref >60)
GFR, EST AFRICAN AMERICAN: 29 mL/min — AB (ref >60)
GLUCOSE: 102 mg/dL — AB (ref 65–99)
Potassium: 4.2 mmol/L (ref 3.5–5.1)
Sodium: 130 mmol/L — ABNORMAL LOW (ref 135–145)

## 2016-07-16 LAB — DIGOXIN LEVEL: Digoxin Level: 1.1 ng/mL (ref 0.8–2.0)

## 2016-07-16 MED ORDER — SPIRONOLACTONE 25 MG PO TABS: 12.5000 mg | ORAL_TABLET | Freq: Every day | ORAL | 3 refills | Status: DC

## 2016-07-16 NOTE — Patient Instructions (Signed)
Start Spironolactone 12.5 mg (1/2 tab) daily  Labs today  Your physician has requested that you have a cardiac catheterization. Cardiac catheterization is used to diagnose and/or treat various heart conditions. Doctors may recommend this procedure for a number of different reasons. The most common reason is to evaluate chest pain. Chest pain can be a symptom of coronary artery disease (CAD), and cardiac catheterization can show whether plaque is narrowing or blocking your heart's arteries. This procedure is also used to evaluate the valves, as well as measure the blood flow and oxygen levels in different parts of your heart. For further information please visit [email or web address]. Please follow instruction sheet, as given.  Your physician recommends that you schedule a follow-up appointment in: 3 weeks

## 2016-07-16 NOTE — Progress Notes (Signed)
PCP: Elyn Aquas Cardiology: Dr. Aundra Dubin  65 yo with history of severe depression, CKD stage III, carotid stenosis, CAD, and chronic systolic CHF presents for cardiology followup.  She recently had right and left heart cath (8/17), showing optimized LV and RV filling pressures and relatively preserved cardiac output.  She had severe stenoses in a large OM1 and a large D2.  Given elevated creatinine, PCI was not done and she was taken off the table for decision-making.  She subsequently had a TEE in 8/17 showed EF 25-30% with severe functional MR.    Her functional capacity appears fairly poor.  She lives alone.  She is limited by back pain and also, as noted, has severe depression with multiple psychotropic meds. Orthostatic hypotension has limited our ability to get her on an ideal cardiac regimen.  She is only taking digoxin at low dose.  Currently, denies orthostatic lightheadedness.  She is short of breath walking up steps or walking longer distances.  No orthopnea/PND.  No chest pain.  Not getting lightheaded spells currently.    ECG: NSR, LAFB, 1st degree AV block, narrow QRS  Labs (8/17): TSH normal Labs (9/17): Na 133, K 4.7, creatinine 1.68, hgb 10.2  PMH: 1. CKD: Stage III, has Bright's disease.  2. Chronic hyponatremia 3. Carotid stenosis: RICA totally occluded, s/p left CEA in 2013.  4. Depression 5. CAD: h/o PCI to diagonal with later in-stent restenosis and POBA to diagonal in 2011.   - LHC (8/17) with 80% OM1 stenosis, 95% D2 stenosis.   6. H/o orthostatic hypotension: Worked up for adrenal insufficiency by endocrinology, thought to be more likely due to psychotropic meds.  7. Chronic low back pain with spondylolisthesis.  8. 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  Cardiomyopathy seems out of proportion to coronary disease.  - RHC (8/17): mean RA 4, PA 32.9, mean PCWP 10, CI 2.7 Fick, 2.08 thermodilution.  - TEE (8/17): EF 25-30%, moderately  dilated LV, RV normal size and systolic function, severe central mitral regurgitation (suspect functional). 9. Mitral regurgitation: Severe, likely functional.   SH: Lives alone in [place].  Nonsmoker, no ETOH.    Family History  Problem Relation Age of Onset  . Coronary artery disease Father   . Prostate cancer Father     not sure  . Pancreatic cancer Father     not sure  . Cancer Father     Pancreatic and  Prostate  . Heart disease Father     Before age 84 and BPG  . Hyperlipidemia Father   . Heart attack Father   . Hypertension Mother   . Diabetes Sister   . Colon cancer Neg Hx    ROS: All systems reviewed and negative except as per HPI.   Current Outpatient Prescriptions  Medication Sig Dispense Refill  . aspirin 81 MG tablet Take 1 tablet (81 mg total) by mouth daily.    [name] atorvastatin (LIPITOR) 40 MG tablet Take 1 tablet (40 mg total) by mouth daily at 6 PM. 30 tablet 11  . Black Cohosh 540 MG CAPS Take 540 mg by mouth 2 (two) times daily.    . clonazePAM (KLONOPIN) 1 MG tablet Take 2 mg by mouth 2 (two) times daily.     . clopidogrel (PLAVIX) 75 MG tablet Take ONE (1) tablet by mouth once daily 30 tablet 9  . digoxin 62.5 MCG TABS Take 0.0625 mg by mouth daily. 30 tablet 11  . HYDROcodone-acetaminophen (NORCO) 10-325 MG tablet Take 1  tablet by mouth every 6 (six) hours as needed. 30 tablet 0  . QUEtiapine (SEROQUEL XR) 300 MG 24 hr tablet Take 300 mg by mouth 2 (two) times daily.    . QUEtiapine (SEROQUEL) 400 MG tablet Take 400 mg by mouth at bedtime.    . risperiDONE (RISPERDAL) 2 MG tablet Take 2 mg by mouth 3 (three) times daily.     . nitroGLYCERIN (NITROSTAT) 0.4 MG SL tablet Place 1 tablet (0.4 mg total) under the tongue every 5 (five) minutes as needed for chest pain. (Patient not taking: Reported on 07/16/2016) 25 tablet 3  . spironolactone (ALDACTONE) 25 MG tablet Take 0.5 tablets (12.5 mg total) by mouth daily. 15 tablet 3   No current facility-administered  medications for this encounter.    BP (!) 110/54   Pulse 84   Wt 124 lb 8 oz (56.5 kg)   SpO2 98%   BMI 22.05 kg/m  General: NAD Neck: No JVD, no thyromegaly or thyroid nodule.  Lungs: Clear to auscultation bilaterally with normal respiratory effort. CV: Nondisplaced PMI.  Heart regular S1/S2, no S3/S4, 2/6 HSM at apex.  No peripheral edema.  No carotid bruit.  Normal pedal pulses.  Abdomen: Soft, nontender, no hepatosplenomegaly, no distention.  Skin: Intact without lesions or rashes.  Neurologic: Alert and oriented x 3.  Psych: Normal affect. Extremities: No clubbing or cyanosis.  HEENT: Normal.   Assessment/Plan: 1. Chronic systolic CHF: TEE (0000000) with EF 25-30%, diffuse hypokinesis.  Suspect mixed ischemic/nonischemic cardiomyopathy.  She has had trouble tolerating cardiac meds due to orthostatic hypotension.  She does not appear volume overloaded on exam, and recent RHC showed normal right and left heart filling pressures with relatively preserved cardiac output.  - Continue digoxin 0.0625 mg daily, check level today.  - Will try to get her on low dose beta blocker at next appt.   - She does not appear to need a diuretic at this time.  - Will plan PCI to OM1 and D2 (see below).  In absence of significant volume overload, dyspnea may be her anginal equivalent.   2. CAD: Recent LHC (8/17) with severe disease in large OM1 and large D2.  As above, dyspnea may be anginal equivalent given her normal filling pressures. Also possible that PCI could lead to some improvement in LV function.  - Plan PCI OM1 and D2 next week, will arrange with Dr Ellyn Hack who did her initial cath.  - Continue ASA, Plavix, and atorvastatin.   3. CKD: Stage III.  Has history of Bright's disease.  Will have to follow closely with PCI.  4. Carotid stenosis: RICA occluded, has had left CEA.  5. MItral regurgitation: Severe functional MR.  Benefit to repair is questionable.  Mitraclip would be consideration but  surgical repair likely would be of prohibitive risk (and questionable benefit).   6. Poor functional status: Appears to be from combination of depression and low back pain.  Loralie Champagne 07/17/2016

## 2016-07-22 ENCOUNTER — Encounter (HOSPITAL_COMMUNITY)
Admission: RE | Disposition: A | Discharge status: Home / Self Care | Payer: Self-pay | Source: Ambulatory Visit | Attending: Cardiology

## 2016-07-22 ENCOUNTER — Ambulatory Visit (HOSPITAL_COMMUNITY)
Admission: RE | Admit: 2016-07-22 | Discharge: 2016-07-23 | Disposition: A | Discharge status: Home / Self Care | Payer: Medicare Other | Source: Ambulatory Visit | Attending: Cardiology | Admitting: Cardiology

## 2016-07-22 ENCOUNTER — Encounter (HOSPITAL_COMMUNITY): Payer: Self-pay | Admitting: *Deleted

## 2016-07-22 ENCOUNTER — Telehealth (HOSPITAL_COMMUNITY): Payer: Self-pay | Admitting: *Deleted

## 2016-07-22 DIAGNOSIS — I5022 Chronic systolic (congestive) heart failure: Secondary | ICD-10-CM | POA: Insufficient documentation

## 2016-07-22 DIAGNOSIS — I340 Nonrheumatic mitral (valve) insufficiency: Secondary | ICD-10-CM | POA: Diagnosis not present

## 2016-07-22 DIAGNOSIS — N189 Chronic kidney disease, unspecified: Secondary | ICD-10-CM | POA: Diagnosis present

## 2016-07-22 DIAGNOSIS — N183 Chronic kidney disease, stage 3 (moderate): Secondary | ICD-10-CM | POA: Diagnosis not present

## 2016-07-22 DIAGNOSIS — E785 Hyperlipidemia, unspecified: Secondary | ICD-10-CM | POA: Diagnosis not present

## 2016-07-22 DIAGNOSIS — K219 Gastro-esophageal reflux disease without esophagitis: Secondary | ICD-10-CM | POA: Insufficient documentation

## 2016-07-22 DIAGNOSIS — I2510 Atherosclerotic heart disease of native coronary artery without angina pectoris: Secondary | ICD-10-CM

## 2016-07-22 DIAGNOSIS — Z7902 Long term (current) use of antithrombotics/antiplatelets: Secondary | ICD-10-CM | POA: Insufficient documentation

## 2016-07-22 DIAGNOSIS — I63239 Cerebral infarction due to unspecified occlusion or stenosis of unspecified carotid arteries: Secondary | ICD-10-CM | POA: Diagnosis present

## 2016-07-22 DIAGNOSIS — T82858A Stenosis of vascular prosthetic devices, implants and grafts, initial encounter: Secondary | ICD-10-CM | POA: Insufficient documentation

## 2016-07-22 DIAGNOSIS — F329 Major depressive disorder, single episode, unspecified: Secondary | ICD-10-CM | POA: Insufficient documentation

## 2016-07-22 DIAGNOSIS — Z955 Presence of coronary angioplasty implant and graft: Secondary | ICD-10-CM | POA: Insufficient documentation

## 2016-07-22 DIAGNOSIS — Z9861 Coronary angioplasty status: Secondary | ICD-10-CM

## 2016-07-22 DIAGNOSIS — D649 Anemia, unspecified: Secondary | ICD-10-CM | POA: Diagnosis not present

## 2016-07-22 DIAGNOSIS — I25119 Atherosclerotic heart disease of native coronary artery with unspecified angina pectoris: Secondary | ICD-10-CM | POA: Insufficient documentation

## 2016-07-22 DIAGNOSIS — I255 Ischemic cardiomyopathy: Secondary | ICD-10-CM | POA: Insufficient documentation

## 2016-07-22 DIAGNOSIS — Z79899 Other long term (current) drug therapy: Secondary | ICD-10-CM | POA: Diagnosis not present

## 2016-07-22 DIAGNOSIS — F419 Anxiety disorder, unspecified: Secondary | ICD-10-CM | POA: Insufficient documentation

## 2016-07-22 DIAGNOSIS — I959 Hypotension, unspecified: Secondary | ICD-10-CM | POA: Insufficient documentation

## 2016-07-22 DIAGNOSIS — F341 Dysthymic disorder: Secondary | ICD-10-CM | POA: Diagnosis present

## 2016-07-22 DIAGNOSIS — I25118 Atherosclerotic heart disease of native coronary artery with other forms of angina pectoris: Secondary | ICD-10-CM | POA: Insufficient documentation

## 2016-07-22 DIAGNOSIS — I6529 Occlusion and stenosis of unspecified carotid artery: Secondary | ICD-10-CM | POA: Insufficient documentation

## 2016-07-22 DIAGNOSIS — Z7982 Long term (current) use of aspirin: Secondary | ICD-10-CM | POA: Insufficient documentation

## 2016-07-22 DIAGNOSIS — Y838 Other surgical procedures as the cause of abnormal reaction of the patient, or of later complication, without mention of misadventure at the time of the procedure: Secondary | ICD-10-CM | POA: Insufficient documentation

## 2016-07-22 HISTORY — PX: CARDIAC CATHETERIZATION: SHX172

## 2016-07-22 LAB — POCT ACTIVATED CLOTTING TIME
ACTIVATED CLOTTING TIME: 241 s
ACTIVATED CLOTTING TIME: 279 s
Activated Clotting Time: 252 seconds
Activated Clotting Time: 285 seconds
Activated Clotting Time: 202 seconds
Activated Clotting Time: 164 seconds

## 2016-07-22 LAB — BASIC METABOLIC PANEL WITH GFR
Anion gap: 10 (ref 5–15)
BUN: 17 mg/dL (ref 6–20)
CO2: 22 mmol/L (ref 22–32)
Calcium: 9.9 mg/dL (ref 8.9–10.3)
Chloride: 99 mmol/L — ABNORMAL LOW (ref 101–111)
Creatinine, Ser: 2.26 mg/dL — ABNORMAL HIGH (ref 0.44–1.00)
GFR calc Af Amer: 25 mL/min — ABNORMAL LOW
GFR calc non Af Amer: 22 mL/min — ABNORMAL LOW
Glucose, Bld: 109 mg/dL — ABNORMAL HIGH (ref 65–99)
Potassium: 4 mmol/L (ref 3.5–5.1)
Sodium: 131 mmol/L — ABNORMAL LOW (ref 135–145)

## 2016-07-22 LAB — PROTIME-INR
INR: 0.97
Prothrombin Time: 12.9 seconds (ref 11.4–15.2)

## 2016-07-22 LAB — CBC
HCT: 33.4 % — ABNORMAL LOW (ref 36.0–46.0)
Hemoglobin: 10.9 g/dL — ABNORMAL LOW (ref 12.0–15.0)
MCH: 28.6 pg (ref 26.0–34.0)
MCHC: 32.6 g/dL (ref 30.0–36.0)
MCV: 87.7 fL (ref 78.0–100.0)
Platelets: 280 K/uL (ref 150–400)
RBC: 3.81 MIL/uL — ABNORMAL LOW (ref 3.87–5.11)
RDW: 14.2 % (ref 11.5–15.5)
WBC: 9.6 K/uL (ref 4.0–10.5)

## 2016-07-22 SURGERY — CORONARY STENT INTERVENTION
Anesthesia: LOCAL

## 2016-07-22 MED ORDER — MORPHINE SULFATE (PF) 2 MG/ML IV SOLN
2.0000 mg | INTRAVENOUS | Status: DC | PRN
  Administered 2016-07-22: 2 mg via INTRAVENOUS
  Filled 2016-07-22: qty 1

## 2016-07-22 MED ORDER — QUETIAPINE FUMARATE ER 300 MG PO TB24
300.0000 mg | ORAL_TABLET | Freq: Two times a day (BID) | ORAL | Status: DC
  Administered 2016-07-22: 22:00:00 300 mg via ORAL
  Filled 2016-07-22 (×3): qty 1

## 2016-07-22 MED ORDER — SODIUM CHLORIDE 0.9% FLUSH
3.0000 mL | Freq: Two times a day (BID) | INTRAVENOUS | Status: DC
Start: 2016-07-22 — End: 2016-07-23
  Administered 2016-07-22 – 2016-07-23 (×2): 3 mL via INTRAVENOUS

## 2016-07-22 MED ORDER — MIDAZOLAM HCL 2 MG/2ML IJ SOLN
INTRAMUSCULAR | Status: AC
  Filled 2016-07-22: qty 2

## 2016-07-22 MED ORDER — SODIUM CHLORIDE 0.9% FLUSH: 3.0000 mL | INTRAVENOUS | Status: DC | PRN

## 2016-07-22 MED ORDER — ONDANSETRON HCL 4 MG/2ML IJ SOLN: 4.0000 mg | Freq: Four times a day (QID) | INTRAMUSCULAR | Status: DC | PRN

## 2016-07-22 MED ORDER — ACETAMINOPHEN 325 MG PO TABS: 650.0000 mg | ORAL_TABLET | ORAL | Status: DC | PRN

## 2016-07-22 MED ORDER — FENTANYL CITRATE (PF) 100 MCG/2ML IJ SOLN
INTRAMUSCULAR | Status: AC
  Filled 2016-07-22: qty 2

## 2016-07-22 MED ORDER — ASPIRIN EC 81 MG PO TBEC
81.0000 mg | DELAYED_RELEASE_TABLET | Freq: Every day | ORAL | Status: DC
  Administered 2016-07-23: 81 mg via ORAL
  Filled 2016-07-22: qty 1

## 2016-07-22 MED ORDER — HEPARIN (PORCINE) IN NACL 2-0.9 UNIT/ML-% IJ SOLN
INTRAMUSCULAR | Status: AC
  Filled 2016-07-22: qty 1000

## 2016-07-22 MED ORDER — DIGOXIN 0.0625 MG HALF TABLET
0.0625 mg | ORAL_TABLET | Freq: Every day | ORAL | Status: DC
  Administered 2016-07-23: 0.0625 mg via ORAL
  Filled 2016-07-22: qty 1

## 2016-07-22 MED ORDER — SODIUM CHLORIDE 0.9 % IV SOLN
INTRAVENOUS | Status: DC
  Administered 2016-07-22: 10:00:00 via INTRAVENOUS

## 2016-07-22 MED ORDER — LIDOCAINE HCL (PF) 1 % IJ SOLN
INTRAMUSCULAR | Status: DC | PRN
  Administered 2016-07-22: 20 mL via INTRADERMAL

## 2016-07-22 MED ORDER — BLACK COHOSH 540 MG PO CAPS
540.0000 mg | ORAL_CAPSULE | Freq: Two times a day (BID) | ORAL | Status: DC
Start: 2016-07-22 — End: 2016-07-22

## 2016-07-22 MED ORDER — HEPARIN (PORCINE) IN NACL 2-0.9 UNIT/ML-% IJ SOLN
INTRAMUSCULAR | Status: DC | PRN
  Administered 2016-07-22: 1000 mL

## 2016-07-22 MED ORDER — IOPAMIDOL (ISOVUE-370) INJECTION 76%
INTRAVENOUS | Status: AC
  Filled 2016-07-22: qty 125

## 2016-07-22 MED ORDER — HYDROCODONE-ACETAMINOPHEN 10-325 MG PO TABS: 1.0000 | ORAL_TABLET | Freq: Four times a day (QID) | ORAL | Status: DC | PRN

## 2016-07-22 MED ORDER — FENTANYL CITRATE (PF) 100 MCG/2ML IJ SOLN
INTRAMUSCULAR | Status: DC | PRN
  Administered 2016-07-22 (×2): 25 ug via INTRAVENOUS

## 2016-07-22 MED ORDER — SODIUM CHLORIDE 0.9% FLUSH: 3.0000 mL | Freq: Two times a day (BID) | INTRAVENOUS | Status: DC

## 2016-07-22 MED ORDER — ATORVASTATIN CALCIUM 40 MG PO TABS
40.0000 mg | ORAL_TABLET | Freq: Every day | ORAL | Status: DC
  Administered 2016-07-22 – 2016-07-23 (×2): 40 mg via ORAL
  Filled 2016-07-22 (×2): qty 1

## 2016-07-22 MED ORDER — ATROPINE SULFATE 1 MG/10ML IJ SOSY
PREFILLED_SYRINGE | INTRAMUSCULAR | Status: AC
  Filled 2016-07-22: qty 10

## 2016-07-22 MED ORDER — SODIUM CHLORIDE 0.9 % IV SOLN
INTRAVENOUS | Status: DC
  Administered 2016-07-22: 1000 mL via INTRAVENOUS

## 2016-07-22 MED ORDER — CLONAZEPAM 0.5 MG PO TABS
2.0000 mg | ORAL_TABLET | Freq: Two times a day (BID) | ORAL | Status: DC
  Administered 2016-07-22: 23:00:00 2 mg via ORAL
  Filled 2016-07-22 (×2): qty 4

## 2016-07-22 MED ORDER — RISPERIDONE 2 MG PO TABS
2.0000 mg | ORAL_TABLET | Freq: Three times a day (TID) | ORAL | Status: DC
  Administered 2016-07-22 – 2016-07-23 (×3): 2 mg via ORAL
  Filled 2016-07-22 (×4): qty 1

## 2016-07-22 MED ORDER — MIDAZOLAM HCL 2 MG/2ML IJ SOLN
INTRAMUSCULAR | Status: DC | PRN
  Administered 2016-07-22 (×2): 1 mg via INTRAVENOUS

## 2016-07-22 MED ORDER — NITROGLYCERIN 1 MG/10 ML FOR IR/CATH LAB
INTRA_ARTERIAL | Status: AC
  Filled 2016-07-22: qty 10

## 2016-07-22 MED ORDER — SODIUM CHLORIDE 0.9 % IV SOLN: 250.0000 mL | INTRAVENOUS | Status: DC | PRN

## 2016-07-22 MED ORDER — IOPAMIDOL (ISOVUE-370) INJECTION 76%
INTRAVENOUS | Status: AC
  Filled 2016-07-22: qty 50

## 2016-07-22 MED ORDER — CLOPIDOGREL BISULFATE 75 MG PO TABS
75.0000 mg | ORAL_TABLET | Freq: Every day | ORAL | Status: DC
  Administered 2016-07-23: 10:00:00 75 mg via ORAL
  Filled 2016-07-22: qty 1

## 2016-07-22 MED ORDER — QUETIAPINE FUMARATE 400 MG PO TABS
400.0000 mg | ORAL_TABLET | Freq: Every day | ORAL | Status: DC
  Administered 2016-07-22: 22:00:00 400 mg via ORAL
  Filled 2016-07-22 (×2): qty 1

## 2016-07-22 MED ORDER — HEPARIN SODIUM (PORCINE) 1000 UNIT/ML IJ SOLN
INTRAMUSCULAR | Status: DC | PRN
  Administered 2016-07-22: 5000 [IU] via INTRAVENOUS
  Administered 2016-07-22: 2000 [IU] via INTRAVENOUS
  Administered 2016-07-22: 3000 [IU] via INTRAVENOUS

## 2016-07-22 MED ORDER — NITROGLYCERIN 0.4 MG SL SUBL: 0.4000 mg | SUBLINGUAL_TABLET | SUBLINGUAL | Status: DC | PRN

## 2016-07-22 MED ORDER — HEPARIN SODIUM (PORCINE) 1000 UNIT/ML IJ SOLN
INTRAMUSCULAR | Status: AC
  Filled 2016-07-22: qty 1

## 2016-07-22 MED ORDER — LIDOCAINE HCL (PF) 1 % IJ SOLN
INTRAMUSCULAR | Status: AC
  Filled 2016-07-22: qty 30

## 2016-07-22 SURGICAL SUPPLY — 17 items
BALLN ANGIOSCULPT RX 2.5X10 (BALLOONS) ×2
BALLN EMERGE MR 2.0X12 (BALLOONS) ×2
BALLN ~~LOC~~ EMERGE MR 2.75X8 (BALLOONS) ×2
BALLOON ANGIOSCULPT RX 2.5X10 (BALLOONS) IMPLANT
BALLOON EMERGE MR 2.0X12 (BALLOONS) IMPLANT
BALLOON ~~LOC~~ EMERGE MR 2.75X8 (BALLOONS) IMPLANT
CATH VISTA GUIDE 6FR XBLAD3.5 (CATHETERS) ×1 IMPLANT
KIT ENCORE 26 ADVANTAGE (KITS) ×3 IMPLANT
KIT HEART LEFT (KITS) ×2 IMPLANT
PACK CARDIAC CATHETERIZATION (CUSTOM PROCEDURE TRAY) ×2 IMPLANT
SHEATH PINNACLE 6F 10CM (SHEATH) ×1 IMPLANT
STENT PROMUS PREM MR 2.5X12 (Permanent Stent) ×2 IMPLANT
TRANSDUCER W/STOPCOCK (MISCELLANEOUS) ×2 IMPLANT
TUBING CIL FLEX 10 FLL-RA (TUBING) ×2 IMPLANT
WIRE ASAHI PROWATER 180CM (WIRE) ×1 IMPLANT
WIRE EMERALD 3MM-J .035X150CM (WIRE) ×1 IMPLANT
WIRE HI TORQ BMW 190CM (WIRE) ×1 IMPLANT

## 2016-07-22 NOTE — Telephone Encounter (Signed)
Notes Recorded by Harvie Junior, CMA on 07/22/2016 at 10:43 AM EDT Unable to reach patient.letter mailed   ------  Notes Recorded by Harvie Junior, CMA on 07/18/2016 at 4:15 PM EDT Unable to reach patient. This attempt the phone kept ringing and no voicemail picked up .  ------  Notes Recorded by Harvie Junior, [place] on 07/17/2016 at 9:35 AM EDT Left message for patient to call back.  ------  Notes Recorded by Larey Dresser, MD on 07/17/2016 at 9:18 AM EDT Creatinine is within the range in which she varies. Cut back on po fluid intake with persistent mild hyponatremia. Needs to return prior to taking am digoxin for a digoxin level as a trough, would like her to do this as soon as she can.   Ref Range & Units 6d ago  Digoxin Level 0.8 - 2.0 ng/mL 1.1   Resulting Agency  [facility]

## 2016-07-22 NOTE — Interval H&P Note (Signed)
History and Physical Interval Note:  07/22/2016 3:09 PM  Caroline [name]  has presented today for surgery, with the diagnosis of CAD, CHF with angina. The various methods of treatment have been discussed with the patient and family. After consideration of risks, benefits and other options for treatment, the patient has consented to  Procedure(s): Coronary Stent Intervention (N/A) as a surgical intervention .  The patient's history has been reviewed, patient examined, no change in status, stable for surgery.  I have reviewed the patient's chart and labs.  Questions were answered to the patient's satisfaction.     Cath Lab Visit (complete for each Cath Lab visit)  Clinical Evaluation Leading to the Procedure:   ACS: No.  Non-ACS:    Anginal Classification: CCS III  Anti-ischemic medical therapy: Minimal Therapy (1 class of medications)  Non-Invasive Test Results: No non-invasive testing performed  Prior CABG: No previous CABG    Glenetta Hew

## 2016-07-22 NOTE — H&P (View-Only) (Signed)
PCP: Elyn Aquas Cardiology: Dr. Aundra Dubin  65 yo with history of severe depression, CKD stage III, carotid stenosis, CAD, and chronic systolic CHF presents for cardiology followup.  She recently had right and left heart cath (8/17), showing optimized LV and RV filling pressures and relatively preserved cardiac output.  She had severe stenoses in a large OM1 and a large D2.  Given elevated creatinine, PCI was not done and she was taken off the table for decision-making.  She subsequently had a TEE in 8/17 showed EF 25-30% with severe functional MR.    Her functional capacity appears fairly poor.  She lives alone.  She is limited by back pain and also, as noted, has severe depression with multiple psychotropic meds. Orthostatic hypotension has limited our ability to get her on an ideal cardiac regimen.  She is only taking digoxin at low dose.  Currently, denies orthostatic lightheadedness.  She is short of breath walking up steps or walking longer distances.  No orthopnea/PND.  No chest pain.  Not getting lightheaded spells currently.    ECG: NSR, LAFB, 1st degree AV block, narrow QRS  Labs (8/17): TSH normal Labs (9/17): Na 133, K 4.7, creatinine 1.68, hgb 10.2  PMH: 1. CKD: Stage III, has Bright's disease.  2. Chronic hyponatremia 3. Carotid stenosis: RICA totally occluded, s/p left CEA in 2013.  4. Depression 5. CAD: h/o PCI to diagonal with later in-stent restenosis and POBA to diagonal in 2011.   - LHC (8/17) with 80% OM1 stenosis, 95% D2 stenosis.   6. H/o orthostatic hypotension: Worked up for adrenal insufficiency by endocrinology, thought to be more likely due to psychotropic meds.  7. Chronic low back pain with spondylolisthesis.  8. 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  Cardiomyopathy seems out of proportion to coronary disease.  - RHC (8/17): mean RA 4, PA 32.9, mean PCWP 10, CI 2.7 Fick, 2.08 thermodilution.  - TEE (8/17): EF 25-30%, moderately  dilated LV, RV normal size and systolic function, severe central mitral regurgitation (suspect functional). 9. Mitral regurgitation: Severe, likely functional.   SH: Lives alone in [place].  Nonsmoker, no ETOH.    Family History  Problem Relation Age of Onset  . Coronary artery disease Father   . Prostate cancer Father     not sure  . Pancreatic cancer Father     not sure  . Cancer Father     Pancreatic and  Prostate  . Heart disease Father     Before age 32 and BPG  . Hyperlipidemia Father   . Heart attack Father   . Hypertension Mother   . Diabetes Sister   . Colon cancer Neg Hx    ROS: All systems reviewed and negative except as per HPI.   Current Outpatient Prescriptions  Medication Sig Dispense Refill  . aspirin 81 MG tablet Take 1 tablet (81 mg total) by mouth daily.    [name] atorvastatin (LIPITOR) 40 MG tablet Take 1 tablet (40 mg total) by mouth daily at 6 PM. 30 tablet 11  . Black Cohosh 540 MG CAPS Take 540 mg by mouth 2 (two) times daily.    . clonazePAM (KLONOPIN) 1 MG tablet Take 2 mg by mouth 2 (two) times daily.     . clopidogrel (PLAVIX) 75 MG tablet Take ONE (1) tablet by mouth once daily 30 tablet 9  . digoxin 62.5 MCG TABS Take 0.0625 mg by mouth daily. 30 tablet 11  . HYDROcodone-acetaminophen (NORCO) 10-325 MG tablet Take 1  tablet by mouth every 6 (six) hours as needed. 30 tablet 0  . QUEtiapine (SEROQUEL XR) 300 MG 24 hr tablet Take 300 mg by mouth 2 (two) times daily.    . QUEtiapine (SEROQUEL) 400 MG tablet Take 400 mg by mouth at bedtime.    . risperiDONE (RISPERDAL) 2 MG tablet Take 2 mg by mouth 3 (three) times daily.     . nitroGLYCERIN (NITROSTAT) 0.4 MG SL tablet Place 1 tablet (0.4 mg total) under the tongue every 5 (five) minutes as needed for chest pain. (Patient not taking: Reported on 07/16/2016) 25 tablet 3  . spironolactone (ALDACTONE) 25 MG tablet Take 0.5 tablets (12.5 mg total) by mouth daily. 15 tablet 3   No current facility-administered  medications for this encounter.    BP (!) 110/54   Pulse 84   Wt 124 lb 8 oz (56.5 kg)   SpO2 98%   BMI 22.05 kg/m  General: NAD Neck: No JVD, no thyromegaly or thyroid nodule.  Lungs: Clear to auscultation bilaterally with normal respiratory effort. CV: Nondisplaced PMI.  Heart regular S1/S2, no S3/S4, 2/6 HSM at apex.  No peripheral edema.  No carotid bruit.  Normal pedal pulses.  Abdomen: Soft, nontender, no hepatosplenomegaly, no distention.  Skin: Intact without lesions or rashes.  Neurologic: Alert and oriented x 3.  Psych: Normal affect. Extremities: No clubbing or cyanosis.  HEENT: Normal.   Assessment/Plan: 1. Chronic systolic CHF: TEE (0000000) with EF 25-30%, diffuse hypokinesis.  Suspect mixed ischemic/nonischemic cardiomyopathy.  She has had trouble tolerating cardiac meds due to orthostatic hypotension.  She does not appear volume overloaded on exam, and recent RHC showed normal right and left heart filling pressures with relatively preserved cardiac output.  - Continue digoxin 0.0625 mg daily, check level today.  - Will try to get her on low dose beta blocker at next appt.   - She does not appear to need a diuretic at this time.  - Will plan PCI to OM1 and D2 (see below).  In absence of significant volume overload, dyspnea may be her anginal equivalent.   2. CAD: Recent LHC (8/17) with severe disease in large OM1 and large D2.  As above, dyspnea may be anginal equivalent given her normal filling pressures. Also possible that PCI could lead to some improvement in LV function.  - Plan PCI OM1 and D2 next week, will arrange with Dr Ellyn Hack who did her initial cath.  - Continue ASA, Plavix, and atorvastatin.   3. CKD: Stage III.  Has history of Bright's disease.  Will have to follow closely with PCI.  4. Carotid stenosis: RICA occluded, has had left CEA.  5. MItral regurgitation: Severe functional MR.  Benefit to repair is questionable.  Mitraclip would be consideration but  surgical repair likely would be of prohibitive risk (and questionable benefit).   6. Poor functional status: Appears to be from combination of depression and low back pain.  Loralie Champagne 07/17/2016

## 2016-07-23 ENCOUNTER — Encounter (HOSPITAL_COMMUNITY): Payer: Self-pay | Admitting: Cardiology

## 2016-07-23 DIAGNOSIS — I951 Orthostatic hypotension: Secondary | ICD-10-CM

## 2016-07-23 DIAGNOSIS — Z9861 Coronary angioplasty status: Secondary | ICD-10-CM

## 2016-07-23 DIAGNOSIS — I2510 Atherosclerotic heart disease of native coronary artery without angina pectoris: Secondary | ICD-10-CM | POA: Diagnosis not present

## 2016-07-23 DIAGNOSIS — I25118 Atherosclerotic heart disease of native coronary artery with other forms of angina pectoris: Secondary | ICD-10-CM | POA: Diagnosis not present

## 2016-07-23 DIAGNOSIS — T82858A Stenosis of vascular prosthetic devices, implants and grafts, initial encounter: Secondary | ICD-10-CM | POA: Diagnosis not present

## 2016-07-23 DIAGNOSIS — Z955 Presence of coronary angioplasty implant and graft: Secondary | ICD-10-CM | POA: Diagnosis not present

## 2016-07-23 DIAGNOSIS — N183 Chronic kidney disease, stage 3 (moderate): Secondary | ICD-10-CM | POA: Diagnosis not present

## 2016-07-23 HISTORY — PX: CORONARY STENT PLACEMENT: SHX1402

## 2016-07-23 LAB — CBC
HEMATOCRIT: 27.7 % — AB (ref 36.0–46.0)
HEMATOCRIT: 28 % — AB (ref 36.0–46.0)
HEMOGLOBIN: 9 g/dL — AB (ref 12.0–15.0)
HEMOGLOBIN: 9 g/dL — AB (ref 12.0–15.0)
MCH: 28.6 pg (ref 26.0–34.0)
MCH: 28.5 pg (ref 26.0–34.0)
MCHC: 32.5 g/dL (ref 30.0–36.0)
MCHC: 32.1 g/dL (ref 30.0–36.0)
MCV: 87.9 fL (ref 78.0–100.0)
MCV: 88.6 fL (ref 78.0–100.0)
Platelets: 259 10*3/uL (ref 150–400)
Platelets: 250 10*3/uL (ref 150–400)
RBC: 3.15 MIL/uL — ABNORMAL LOW (ref 3.87–5.11)
RBC: 3.16 MIL/uL — ABNORMAL LOW (ref 3.87–5.11)
RDW: 14.6 % (ref 11.5–15.5)
RDW: 14.6 % (ref 11.5–15.5)
WBC: 4.5 10*3/uL (ref 4.0–10.5)
WBC: 7.4 10*3/uL (ref 4.0–10.5)

## 2016-07-23 LAB — BASIC METABOLIC PANEL
ANION GAP: 6 (ref 5–15)
BUN: 23 mg/dL — AB (ref 6–20)
CHLORIDE: 104 mmol/L (ref 101–111)
CO2: 22 mmol/L (ref 22–32)
Calcium: 8.9 mg/dL (ref 8.9–10.3)
Creatinine, Ser: 1.99 mg/dL — ABNORMAL HIGH (ref 0.44–1.00)
GFR calc Af Amer: 30 mL/min — ABNORMAL LOW (ref >60)
GFR calc non Af Amer: 26 mL/min — ABNORMAL LOW (ref >60)
GLUCOSE: 89 mg/dL (ref 65–99)
POTASSIUM: 4.1 mmol/L (ref 3.5–5.1)
Sodium: 132 mmol/L — ABNORMAL LOW (ref 135–145)

## 2016-07-23 MED ORDER — LIVING BETTER WITH HEART FAILURE BOOK
Freq: Once | Status: AC
  Administered 2016-07-23: 06:00:00

## 2016-07-23 MED ORDER — SODIUM CHLORIDE 0.9 % IV BOLUS (SEPSIS)
250.0000 mL | Freq: Once | INTRAVENOUS | Status: AC
  Administered 2016-07-23: 250 mL via INTRAVENOUS

## 2016-07-23 MED ORDER — ANGIOPLASTY BOOK
Freq: Once | Status: AC
  Administered 2016-07-23: 06:00:00
  Filled 2016-07-23: qty 1

## 2016-07-23 NOTE — Care Management Note (Signed)
Case Management Note  Patient Details  Name: Caroline [name] MRN: [id number] Date of Birth: 1951/07/12  Subjective/Objective:  S/p coronary stent intervention, on plavix and asa, will be on plavix and asa. For dc, no needs.                  Action/Plan:   Expected Discharge Date:                  Expected Discharge Plan:  Home/Self Care  In-House Referral:     Discharge planning Services  CM Consult  Post Acute Care Choice:    Choice offered to:     DME Arranged:    DME Agency:     HH Arranged:    HH Agency:     Status of Service:  Completed, signed off  If discussed at [employer] of Stay Meetings, dates discussed:    Additional Comments:  Zenon Mayo, RN 07/23/2016, 4:23 PM

## 2016-07-23 NOTE — Discharge Summary (Signed)
Discharge Summary    Patient ID: Caroline [name],  MRN: [id number], DOB/AGE: January 30, 1951 65 y.o.  Admit date: 07/22/2016 Discharge date: 07/23/2016  Primary Care Provider: [name] Primary Cardiologist: Dr. Aundra Dubin  EP: Dr. Caryl Comes   Discharge Diagnoses    Principal Problem:   CAD S/P percutaneous coronary angioplasty Active Problems:   Hyperlipidemia   ANXIETY DEPRESSION   GERD   Anemia   Carotid artery occlusion with infarction (HCC)   Cardiomyopathy, ischemic   Chronic renal disease, stage 3, moderately decreased glomerular filtration rate (GFR) between 30-59 mL/min/1.73 square meter   Coronary artery disease involving native coronary artery   Allergies No Known Allergies   History of Present Illness     65 yo with history of severe depression, CKD stage III, carotid stenosis, CAD, and chronic systolic CHF who presented to [facility] on 07/22/16 for planned cardiac cath and PCI.     She recently had right and left heart cath (8/17), showing optimized LV and RV filling pressures and relatively preserved cardiac output.  She had severe stenoses in a large OM1 and a large D2. Given elevated creatinine, PCI was not done and she was taken off the table for decision-making.  She subsequently had a TEE in 8/17 showed EF 25-30% with severe functional MR.    She was seen in the office by Dr. Aundra Dubin on 07/16/16 for follow up. Her functional capacity appeared fairly poor. She reported being limited by back pain and also, as noted, has severe depression with multiple psychotropic meds. Orthostatic hypotension has limited our ability to get her on an ideal cardiac regimen. She is only taking digoxin at low dose. She reported being short of breath walking up steps or walking longer distances. No s/s CHF. It was felt that dyspnea may be her anginal equivalent given her normal filling pressures and Dr. Aundra Dubin felt that revascularization may help improve EF, so she was set up for staged PCI  to [place] and D2 with Dr. Ellyn Hack on 07/22/16.   Hospital Course     Consultants: none   1. Chronic systolic CHF: TEE (0000000) with EF 25-30%, diffuse hypokinesis. Suspect mixed ischemic/nonischemic cardiomyopathy. She has had trouble tolerating cardiac meds due to orthostatic hypotension. She does not appear volume overloaded on exam, and recent RHC showed normal right and left heart filling pressures with relatively preserved cardiac output.  - Continue digoxin 0.0625 mg daily and spiro 12.5mg  daily.  - She does not appear to need a diuretic at this time.  - She underwent PCI with DES to OM1 and D2 yesterday. Hopefully her EF will improve some. Will likely need a repeat echo in 90 days and if EF not improve >35%, she may need ICD for primary prevention  2. CAD: Recent LHC (8/17) with severe disease in large OM1 and large D2. Dyspnea felt to be possibly to be anginal equivalent given her normal filling pressures and Dr. Aundra Dubin felt that revascularization may also help improve EF so she was set up for staged PCI to OM1 and D2 with Dr. Ellyn Hack on 07/22/16. This was successfully performed with no complications.  - Continue ASA, Plavix, and atorvastatin. No BB 2/2 hypotension  3. CKD: Stage III. Has history of Bright's disease. Creat stable at 1.99 after PCI with 140 mL of contrast (lower than yesterday).   4. Carotid stenosis: RICA occluded, has had left CEA.   5. MItral regurgitation: Severe functional MR. Benefit to repair is questionable. Mitraclip would be  consideration but surgical repair likely would be of prohibitive risk (and questionable benefit).   6. Poor functional status: Appears to be from combination of depression and low back pain.  7. Hypotension: SBP in 50-60s by BP cuff. Manual BP with SBP 90 which is actually close to her baseline She has a long history of orthostatic hypotension and soft BPs. She was given 250cc bolus of NS. Continue spiro 12.5mg  daily.   8.  Acute on chronic anemia: Hg dropped from 10.9--> 9.0 but she did get IVFs. Repeat CBC showed stable H/H. Likely 2/2 hemodilution   The patient has had an uncomplicated hospital course and is recovering well. The femoral catheter site is stable. She has been seen by Dr. Aundra Dubin today and deemed ready for discharge home. All follow-up appointments have been scheduled. Discharge medications are listed below.  _____________  Discharge Vitals Blood pressure (!) 58/39, pulse 87, temperature 98 F (36.7 C), temperature source Oral, resp. rate (!) 21, height 5\' 3"  (1.6 m), weight 121 lb 4.1 oz (55 kg), SpO2 97 %.  Filed Weights   07/22/16 0940 07/23/16 0552  Weight: 123 lb (55.8 kg) 121 lb 4.1 oz (55 kg)    Labs & Radiologic Studies     CBC  Recent Labs  07/23/16 0317 07/23/16 0911  WBC 4.5 7.4  HGB 9.0* 9.0*  HCT 27.7* 28.0*  MCV 87.9 88.6  PLT 259 [id number]   Basic Metabolic Panel  Recent Labs  07/22/16 0945 07/23/16 0317  NA 131* 132*  K 4.0 4.1  CL 99* 104  CO2 22 22  GLUCOSE 109* 89  BUN 17 23*  CREATININE 2.26* 1.99*  CALCIUM 9.9 8.9     Diagnostic Studies/Procedures    07/22/16 Coronary Stent Intervention  Conclusion   Lesion #1: 2nd Diag-2 lesion, 95 %stenosed - just proximal to previous stents with ~50% ISR  A STENT PROMUS PREM MR 2.5X12 drug eluting stent was successfully placed, and overlaps previously placed stent.  Post intervention, there is a 0% residual stenosis.  Lesion #2: 1st Mrg lesion, 80 %stenosed.  A STENT PROMUS PREM MR 2.5X12 drug eluting stent was successfully placed.  Post intervention, there is a 0% residual stenosis.   Successful 2 vessel PCI of D1 and OM1 with extensive angioplasty of in-stent restenosis in the diagonal branch. Very difficult to image the circumflex OM lesion.  140 mL of contrast used.  Plan:  Overnight monitoring in 6 Central postprocedure unit.  IV fluids 125 mL per hour for 8 hours. -- Monitor for  signs of heart failure  Continue aspirin and Plavix  Expected discharge tomorrow    _____________    Disposition   Pt is being discharged home today in good condition.  Follow-up Plans & Appointments    Follow-up Information    Loralie Champagne, MD .   Specialty:  Cardiology Why:  The office will call to scheudle an appointment for follow up. Please call them if you don't hear from the office by this Friday.  Contact information: [place] [address] [phone]          Discharge Instructions    Amb Referral to Cardiac Rehabilitation    Complete by:  As directed    Diagnosis:  Coronary Stents Comment - to [place]      Discharge Medications     Medication List    TAKE these medications   aspirin 81 MG tablet Take 1 tablet (81 mg total) by  mouth daily.   atorvastatin 40 MG tablet Commonly known as:  LIPITOR Take 1 tablet (40 mg total) by mouth daily at 6 PM.   Black Cohosh 540 MG Caps Take 540 mg by mouth 2 (two) times daily.   clonazePAM 1 MG tablet Commonly known as:  KLONOPIN Take 2 mg by mouth 2 (two) times daily.   clopidogrel 75 MG tablet Commonly known as:  PLAVIX Take ONE (1) tablet by mouth once daily What changed:  See the new instructions.   Digoxin 62.5 MCG Tabs Take 0.0625 mg by mouth daily.   HYDROcodone-acetaminophen 10-325 MG tablet Commonly known as:  NORCO Take 1 tablet by mouth every 6 (six) hours as needed.   nitroGLYCERIN 0.4 MG SL tablet Commonly known as:  NITROSTAT Place 1 tablet (0.4 mg total) under the tongue every 5 (five) minutes as needed for chest pain.   QUEtiapine 300 MG 24 hr tablet Commonly known as:  SEROQUEL XR Take 300 mg by mouth 2 (two) times daily.   QUEtiapine 400 MG tablet Commonly known as:  SEROQUEL Take 400 mg by mouth at bedtime.   risperiDONE 2 MG tablet Commonly known as:  RISPERDAL Take 2 mg by mouth 3 (three) times daily.   spironolactone 25 MG  tablet Commonly known as:  ALDACTONE Take 0.5 tablets (12.5 mg total) by mouth daily.          Outstanding Labs/Studies   BMET, CBC  Duration of Discharge Encounter   Greater than 30 minutes including physician time.  Signed, Angelena Form PA-C 07/23/2016, 12:49 PM

## 2016-07-23 NOTE — Progress Notes (Addendum)
Patient Name: Caroline [name] Date of Encounter: 07/23/2016     Active Problems:   Coronary artery disease involving native coronary artery   CAD S/P percutaneous coronary angioplasty    SUBJECTIVE  Sleepy but will respond to questions. She didn't sleep well last night. No back or groin pain. No dizziness. Feeling normal.   Hemoglobin mildly lower but no back pain or groin complications.   CURRENT MEDS . aspirin EC  81 mg Oral Daily  . atorvastatin  40 mg Oral q1800  . clonazePAM  2 mg Oral BID  . clopidogrel  75 mg Oral Daily  . digoxin  0.0625 mg Oral Daily  . QUEtiapine  300 mg Oral BID  . QUEtiapine  400 mg Oral QHS  . risperiDONE  2 mg Oral TID  . sodium chloride flush  3 mL Intravenous Q12H    OBJECTIVE  Vitals:   07/22/16 2330 07/23/16 0100 07/23/16 0200 07/23/16 0552  BP: (!) 88/35 (!) 96/37 (!) 102/43 (!) 97/43  Pulse:    88  Resp: (!) 23 14 15 17   Temp:    98 F (36.7 C)  TempSrc:    Oral  SpO2:      Weight:    121 lb 4.1 oz (55 kg)  Height:        Intake/Output Summary (Last 24 hours) at 07/23/16 0719 Last data filed at 07/22/16 2329  Gross per 24 hour  Intake            667.5 ml  Output             1100 ml  Net           -432.5 ml   Filed Weights   07/22/16 0940 07/23/16 0552  Weight: 123 lb (55.8 kg) 121 lb 4.1 oz (55 kg)    PHYSICAL EXAM  General: Pleasant, NAD. Lethargic  Neuro: Alert and oriented X 3. Moves all extremities spontaneously. Psych: Normal affect. HEENT:  Normal  Neck: Supple without bruits or JVD. Lungs:  Resp regular and unlabored, CTA. Heart: RRR no s3, s4, or murmurs. Abdomen: Soft, non-tender, non-distended, BS + x 4.  Extremities: No clubbing, cyanosis or edema. DP/PT/Radials 2+ and equal bilaterally.  Accessory Clinical Findings  CBC  Recent Labs  07/22/16 0945 07/23/16 0317  WBC 9.6 4.5  HGB 10.9* 9.0*  HCT 33.4* 27.7*  MCV 87.7 87.9  PLT 280 [id number]   Basic Metabolic Panel  Recent Labs   07/22/16 0945 07/23/16 0317  NA 131* 132*  K 4.0 4.1  CL 99* 104  CO2 22 22  GLUCOSE 109* 89  BUN 17 23*  CREATININE 2.26* 1.99*  CALCIUM 9.9 8.9    TELE  NSR  Radiology/Studies  07/22/16 Coronary Stent Intervention  Conclusion   Lesion #1: 2nd Diag-2 lesion, 95 %stenosed - just proximal to previous stents with ~50% ISR  A STENT PROMUS PREM MR 2.5X12 drug eluting stent was successfully placed, and overlaps previously placed stent.  Post intervention, there is a 0% residual stenosis.  Lesion #2: 1st Mrg lesion, 80 %stenosed.  A STENT PROMUS PREM MR 2.5X12 drug eluting stent was successfully placed.  Post intervention, there is a 0% residual stenosis.    Successful 2 vessel PCI of D1 and OM1 with extensive angioplasty of in-stent restenosis in the diagonal branch. Very difficult to image the circumflex OM lesion.  140 mL of contrast used.  Plan:  Overnight monitoring in 6 Central postprocedure unit.  IV fluids  125 mL per hour for 8 hours. -- Monitor for signs of heart failure  Continue aspirin and Plavix  Expected discharge tomorrow     ASSESSMENT AND PLAN  1. Chronic systolic CHF: TEE (0000000) with EF 25-30%, diffuse hypokinesis.  Suspect mixed ischemic/nonischemic cardiomyopathy.  She has had trouble tolerating cardiac meds due to orthostatic hypotension.  She does not appear volume overloaded on exam, and recent RHC showed normal right and left heart filling pressures with relatively preserved cardiac output.  - Continue digoxin 0.0625 mg daily - She does not appear to need a diuretic at this time.  - She underwent PCI with DES to OM1 and D2 yesterday.  2. CAD: Recent LHC (8/17) with severe disease in large OM1 and large D2. Dyspnea felt to be possibly to be anginal equivalent given her normal filling pressures and Dr. Aundra Dubin felt that revascularization may also help improve EF so she was set up for staged PCI to [place] and D2 with Dr. Ellyn Hack on 07/22/16.   - Continue ASA, Plavix, and atorvastatin.  No BB 2/2 hypotension  3. CKD: Stage III.  Has history of Bright's disease.  Creat stable at 1.99 after PCI with 140 mL of contrast (lower than yesterday).   4. Carotid stenosis: RICA occluded, has had left CEA.   5. MItral regurgitation: Severe functional MR.  Benefit to repair is questionable.  Mitraclip would be consideration but surgical repair likely would be of prohibitive risk (and questionable benefit).    6. Poor functional status: Appears to be from combination of depression and low back pain.  7. Hypotension: SBP in 50-60s by BP cuff. Manual BP with SBP 90 which is actually close to her baseline. Will continue to monitor and give a 250cc bolus of NS  8. Acute on chronic anemia: Hg dropped from 10.9--> 9.0 but she did get IVFs. Will recheck CBC and if it has dropped further, we may need to scan her back to r/o groin site hematoma. Physical exam unremarkable for significant groin hematoma. No back pain.   Signed, Angelena Form PA-C  Pager [phone]  Patient seen with PA, agree with the above note.  Somewhat drowsy this morning with flat affect but no complaints.  This actually seems fairly close to her baseline.  BP low by automatic cuff this morning but denies lightheadedness, etc.  I rechecked with manual cuff, SBP about 90.  At baseline, BP is low and she has a history of orthostatic hypotension.  Creatinine is lower today than yesterday.  - Hemoglobin lower today but may be hemodilution.  Repeat stat to make sure no further drop.  No groin hematoma and she denies back pain.  Can scan for RP hematoma if hgb down further.  - Will give 250 cc normal saline.  She is not volume overloaded on exam.  - Measure BP by manual cuff.  Can get her up later this morning if she remains stable to walk.  She may be able to go home this afternoon.   Loralie Champagne 07/23/2016 9:38 AM

## 2016-07-23 NOTE — Progress Notes (Signed)
CARDIAC REHAB PHASE I   PRE:  Rate/Rhythm: 77 SR  BP:  Sitting: 92/60 manual        SaO2: 98 RA  MODE:  Ambulation: 300 ft   POST:  Rate/Rhythm: 98 SR  BP:  Sitting: 88/56 manual         SaO2: 100 RA  Pt ambulated 300 ft on RA, handheld assist, slow, steady gait, tolerated well with no complaints. Completed PCI/stent education.  Reviewed risk factors, PCI book, anti-platelet therapy, stent card, activity restrictions, ntg, exercise, heart healthy diet, sodium restrictions, CHF booklet and zone tool, daily weights and phase 2 cardiac rehab. Pt verbalized understanding, somewhat flat affect. Pt agrees to phase 2 cardiac rehab referral, will send to [facility] per pt request. Pt to recliner after walk, call bell within reach.   [id number] Lenna Sciara, RN, BSN 07/23/2016 11:34 AM

## 2016-07-23 NOTE — Care Management Note (Signed)
Case Management Note  Patient Details  Name: Caroline [name] MRN: [id number] Date of Birth: 1951-03-29  Subjective/Objective:   S/p coronary stent intervention, on plavix and asa, NCM will cont to follow for dc needs.                 Action/Plan:   Expected Discharge Date:                  Expected Discharge Plan:  Home/Self Care  In-House Referral:     Discharge planning Services  CM Consult  Post Acute Care Choice:    Choice offered to:     DME Arranged:    DME Agency:     HH Arranged:    HH Agency:     Status of Service:  In process, will continue to follow  If discussed at Long Length of Stay Meetings, dates discussed:    Additional Comments:  Zenon Mayo, RN 07/23/2016, 9:01 AM

## 2016-08-07 ENCOUNTER — Encounter (HOSPITAL_COMMUNITY): Payer: Medicare Other

## 2016-08-09 ENCOUNTER — Inpatient Hospital Stay (HOSPITAL_COMMUNITY)
Admission: EM | Admit: 2016-08-09 | Discharge: 2016-08-13 | DRG: 683 | Disposition: A | Discharge status: Home Health | Payer: Medicare Other | Attending: Internal Medicine | Admitting: Internal Medicine

## 2016-08-09 ENCOUNTER — Emergency Department (HOSPITAL_COMMUNITY): Payer: Medicare Other

## 2016-08-09 ENCOUNTER — Encounter (HOSPITAL_COMMUNITY): Payer: Self-pay | Admitting: Vascular Surgery

## 2016-08-09 DIAGNOSIS — R296 Repeated falls: Secondary | ICD-10-CM | POA: Diagnosis present

## 2016-08-09 DIAGNOSIS — I951 Orthostatic hypotension: Secondary | ICD-10-CM

## 2016-08-09 DIAGNOSIS — Z833 Family history of diabetes mellitus: Secondary | ICD-10-CM

## 2016-08-09 DIAGNOSIS — E785 Hyperlipidemia, unspecified: Secondary | ICD-10-CM | POA: Diagnosis present

## 2016-08-09 DIAGNOSIS — Z8249 Family history of ischemic heart disease and other diseases of the circulatory system: Secondary | ICD-10-CM

## 2016-08-09 DIAGNOSIS — F418 Other specified anxiety disorders: Secondary | ICD-10-CM | POA: Diagnosis present

## 2016-08-09 DIAGNOSIS — E876 Hypokalemia: Secondary | ICD-10-CM | POA: Diagnosis present

## 2016-08-09 DIAGNOSIS — Z96642 Presence of left artificial hip joint: Secondary | ICD-10-CM | POA: Diagnosis present

## 2016-08-09 DIAGNOSIS — I5022 Chronic systolic (congestive) heart failure: Secondary | ICD-10-CM | POA: Diagnosis present

## 2016-08-09 DIAGNOSIS — I340 Nonrheumatic mitral (valve) insufficiency: Secondary | ICD-10-CM | POA: Diagnosis present

## 2016-08-09 DIAGNOSIS — D649 Anemia, unspecified: Secondary | ICD-10-CM | POA: Diagnosis present

## 2016-08-09 DIAGNOSIS — K219 Gastro-esophageal reflux disease without esophagitis: Secondary | ICD-10-CM | POA: Diagnosis present

## 2016-08-09 DIAGNOSIS — I2510 Atherosclerotic heart disease of native coronary artery without angina pectoris: Secondary | ICD-10-CM | POA: Diagnosis present

## 2016-08-09 DIAGNOSIS — Z66 Do not resuscitate: Secondary | ICD-10-CM | POA: Diagnosis present

## 2016-08-09 DIAGNOSIS — N179 Acute kidney failure, unspecified: Principal | ICD-10-CM

## 2016-08-09 DIAGNOSIS — F17210 Nicotine dependence, cigarettes, uncomplicated: Secondary | ICD-10-CM | POA: Diagnosis present

## 2016-08-09 DIAGNOSIS — R627 Adult failure to thrive: Secondary | ICD-10-CM | POA: Diagnosis present

## 2016-08-09 DIAGNOSIS — Z9861 Coronary angioplasty status: Secondary | ICD-10-CM

## 2016-08-09 DIAGNOSIS — I130 Hypertensive heart and chronic kidney disease with heart failure and stage 1 through stage 4 chronic kidney disease, or unspecified chronic kidney disease: Secondary | ICD-10-CM | POA: Diagnosis present

## 2016-08-09 DIAGNOSIS — R634 Abnormal weight loss: Secondary | ICD-10-CM

## 2016-08-09 DIAGNOSIS — Z7902 Long term (current) use of antithrombotics/antiplatelets: Secondary | ICD-10-CM

## 2016-08-09 DIAGNOSIS — F341 Dysthymic disorder: Secondary | ICD-10-CM | POA: Diagnosis present

## 2016-08-09 DIAGNOSIS — Z7982 Long term (current) use of aspirin: Secondary | ICD-10-CM

## 2016-08-09 DIAGNOSIS — Z955 Presence of coronary angioplasty implant and graft: Secondary | ICD-10-CM

## 2016-08-09 DIAGNOSIS — W19XXXA Unspecified fall, initial encounter: Secondary | ICD-10-CM

## 2016-08-09 DIAGNOSIS — I4891 Unspecified atrial fibrillation: Secondary | ICD-10-CM | POA: Diagnosis present

## 2016-08-09 DIAGNOSIS — N183 Chronic kidney disease, stage 3 (moderate): Secondary | ICD-10-CM | POA: Diagnosis not present

## 2016-08-09 DIAGNOSIS — I255 Ischemic cardiomyopathy: Secondary | ICD-10-CM | POA: Diagnosis not present

## 2016-08-09 DIAGNOSIS — Z6820 Body mass index (BMI) 20.0-20.9, adult: Secondary | ICD-10-CM

## 2016-08-09 DIAGNOSIS — R55 Syncope and collapse: Secondary | ICD-10-CM

## 2016-08-09 DIAGNOSIS — E440 Moderate protein-calorie malnutrition: Secondary | ICD-10-CM | POA: Diagnosis present

## 2016-08-09 DIAGNOSIS — F3175 Bipolar disorder, in partial remission, most recent episode depressed: Secondary | ICD-10-CM | POA: Diagnosis present

## 2016-08-09 DIAGNOSIS — Z800 Family history of malignant neoplasm of digestive organs: Secondary | ICD-10-CM

## 2016-08-09 DIAGNOSIS — I252 Old myocardial infarction: Secondary | ICD-10-CM

## 2016-08-09 DIAGNOSIS — N189 Chronic kidney disease, unspecified: Secondary | ICD-10-CM

## 2016-08-09 DIAGNOSIS — Z853 Personal history of malignant neoplasm of breast: Secondary | ICD-10-CM

## 2016-08-09 DIAGNOSIS — Z79899 Other long term (current) drug therapy: Secondary | ICD-10-CM

## 2016-08-09 LAB — COMPREHENSIVE METABOLIC PANEL
ALT: 16 U/L (ref 14–54)
AST: 23 U/L (ref 15–41)
Albumin: 3.5 g/dL (ref 3.5–5.0)
Alkaline Phosphatase: 73 U/L (ref 38–126)
Anion gap: 11 (ref 5–15)
BILIRUBIN TOTAL: 0.6 mg/dL (ref 0.3–1.2)
BUN: 35 mg/dL — AB (ref 6–20)
CO2: 25 mmol/L (ref 22–32)
CREATININE: 2.95 mg/dL — AB (ref 0.44–1.00)
Calcium: 9 mg/dL (ref 8.9–10.3)
Chloride: 101 mmol/L (ref 101–111)
GFR, EST AFRICAN AMERICAN: 18 mL/min — AB (ref >60)
GFR, EST NON AFRICAN AMERICAN: 16 mL/min — AB (ref >60)
Glucose, Bld: 115 mg/dL — ABNORMAL HIGH (ref 65–99)
POTASSIUM: 3.1 mmol/L — AB (ref 3.5–5.1)
Sodium: 137 mmol/L (ref 135–145)
TOTAL PROTEIN: 6.5 g/dL (ref 6.5–8.1)

## 2016-08-09 LAB — CBC WITH DIFFERENTIAL/PLATELET
BASOS ABS: 0.1 10*3/uL (ref 0.0–0.1)
Basophils Relative: 1 %
EOS ABS: 0.2 10*3/uL (ref 0.0–0.7)
EOS PCT: 3 %
HCT: 32.5 % — ABNORMAL LOW (ref 36.0–46.0)
Hemoglobin: 10.5 g/dL — ABNORMAL LOW (ref 12.0–15.0)
Lymphocytes Relative: 18 %
Lymphs Abs: 1.2 10*3/uL (ref 0.7–4.0)
MCH: 28.5 pg (ref 26.0–34.0)
MCHC: 32.3 g/dL (ref 30.0–36.0)
MCV: 88.3 fL (ref 78.0–100.0)
Monocytes Absolute: 0.7 10*3/uL (ref 0.1–1.0)
Monocytes Relative: 10 %
Neutro Abs: 4.6 10*3/uL (ref 1.7–7.7)
Neutrophils Relative %: 68 %
PLATELETS: 269 10*3/uL (ref 150–400)
RBC: 3.68 MIL/uL — AB (ref 3.87–5.11)
RDW: 14.5 % (ref 11.5–15.5)
WBC: 6.8 10*3/uL (ref 4.0–10.5)

## 2016-08-09 LAB — I-STAT TROPONIN, ED: TROPONIN I, POC: 0.13 ng/mL — AB (ref 0.00–0.08)

## 2016-08-09 LAB — TROPONIN I: Troponin I: 0.1 ng/mL (ref <0.03)

## 2016-08-09 LAB — DIGOXIN LEVEL: Digoxin Level: 0.7 ng/mL — ABNORMAL LOW (ref 0.8–2.0)

## 2016-08-09 MED ORDER — DIGOXIN 0.0625 MG HALF TABLET
0.0625 mg | ORAL_TABLET | Freq: Every day | ORAL | Status: DC
  Administered 2016-08-09: 0.0625 mg via ORAL
  Filled 2016-08-09 (×2): qty 1

## 2016-08-09 MED ORDER — QUETIAPINE FUMARATE 400 MG PO TABS
400.0000 mg | ORAL_TABLET | Freq: Every day | ORAL | Status: DC
  Administered 2016-08-09 – 2016-08-12 (×4): 400 mg via ORAL
  Filled 2016-08-09 (×4): qty 1

## 2016-08-09 MED ORDER — NITROGLYCERIN 0.4 MG SL SUBL: 0.4000 mg | SUBLINGUAL_TABLET | SUBLINGUAL | Status: DC | PRN

## 2016-08-09 MED ORDER — CLONAZEPAM 0.5 MG PO TABS
2.0000 mg | ORAL_TABLET | Freq: Two times a day (BID) | ORAL | Status: DC
  Administered 2016-08-09 – 2016-08-10 (×2): 2 mg via ORAL
  Filled 2016-08-09 (×2): qty 4

## 2016-08-09 MED ORDER — POTASSIUM CHLORIDE CRYS ER 20 MEQ PO TBCR
40.0000 meq | EXTENDED_RELEASE_TABLET | Freq: Once | ORAL | Status: DC
  Filled 2016-08-09 (×2): qty 2

## 2016-08-09 MED ORDER — SODIUM CHLORIDE 0.9 % IV SOLN
INTRAVENOUS | Status: DC
  Administered 2016-08-09: 17:00:00 via INTRAVENOUS

## 2016-08-09 MED ORDER — SPIRONOLACTONE 25 MG PO TABS
12.5000 mg | ORAL_TABLET | Freq: Every day | ORAL | Status: DC
  Administered 2016-08-09: 12.5 mg via ORAL
  Filled 2016-08-09 (×2): qty 1

## 2016-08-09 MED ORDER — SODIUM CHLORIDE 0.9 % IV BOLUS (SEPSIS)
1000.0000 mL | Freq: Once | INTRAVENOUS | Status: DC
  Administered 2016-08-09: 1000 mL via INTRAVENOUS

## 2016-08-09 MED ORDER — SODIUM CHLORIDE 0.9% FLUSH
3.0000 mL | Freq: Two times a day (BID) | INTRAVENOUS | Status: DC
  Administered 2016-08-10 – 2016-08-13 (×7): 3 mL via INTRAVENOUS

## 2016-08-09 MED ORDER — ASPIRIN EC 81 MG PO TBEC
81.0000 mg | DELAYED_RELEASE_TABLET | Freq: Every day | ORAL | Status: DC
  Administered 2016-08-09 – 2016-08-13 (×5): 81 mg via ORAL
  Filled 2016-08-09 (×5): qty 1

## 2016-08-09 MED ORDER — ACETAMINOPHEN 650 MG RE SUPP: 650.0000 mg | Freq: Four times a day (QID) | RECTAL | Status: DC | PRN

## 2016-08-09 MED ORDER — RISPERIDONE 1 MG PO TABS
2.0000 mg | ORAL_TABLET | Freq: Three times a day (TID) | ORAL | Status: DC
  Administered 2016-08-09 – 2016-08-10 (×2): 2 mg via ORAL
  Filled 2016-08-09 (×2): qty 2

## 2016-08-09 MED ORDER — SODIUM CHLORIDE 0.9 % IV SOLN
INTRAVENOUS | Status: DC
  Administered 2016-08-09: 18:00:00 via INTRAVENOUS

## 2016-08-09 MED ORDER — QUETIAPINE FUMARATE ER 300 MG PO TB24
300.0000 mg | ORAL_TABLET | Freq: Two times a day (BID) | ORAL | Status: DC
  Administered 2016-08-09 – 2016-08-10 (×2): 300 mg via ORAL
  Filled 2016-08-09 (×2): qty 1

## 2016-08-09 MED ORDER — HEPARIN SODIUM (PORCINE) 5000 UNIT/ML IJ SOLN
5000.0000 [IU] | Freq: Three times a day (TID) | INTRAMUSCULAR | Status: DC
  Administered 2016-08-09 – 2016-08-13 (×10): 5000 [IU] via SUBCUTANEOUS
  Filled 2016-08-09 (×10): qty 1

## 2016-08-09 MED ORDER — ACETAMINOPHEN 325 MG PO TABS: 650.0000 mg | ORAL_TABLET | Freq: Four times a day (QID) | ORAL | Status: DC | PRN

## 2016-08-09 MED ORDER — ATORVASTATIN CALCIUM 40 MG PO TABS
40.0000 mg | ORAL_TABLET | Freq: Every day | ORAL | Status: DC
  Administered 2016-08-09 – 2016-08-12 (×4): 40 mg via ORAL
  Filled 2016-08-09 (×4): qty 1

## 2016-08-09 MED ORDER — CLOPIDOGREL BISULFATE 75 MG PO TABS
75.0000 mg | ORAL_TABLET | Freq: Every day | ORAL | Status: DC
  Administered 2016-08-09 – 2016-08-13 (×5): 75 mg via ORAL
  Filled 2016-08-09 (×5): qty 1

## 2016-08-09 NOTE — ED Provider Notes (Signed)
[facility] DEPT Provider Note   CSN: [id number] Arrival date & time: 08/09/16  1407     History   Chief Complaint Chief Complaint  Patient presents with  . Fall  . Dizziness    HPI Caroline [name] is a 65 y.o. female.  HPI    Caroline [name] is a 65 y.o. female, with a history of CHF, Afib, PCI, severe mitral regurg, Stage III CKD, and carotid stenosis, presenting to the ED with an episode of dizziness followed by a fall just PTA. Pt states she fell backward and hit the back of her head on the floor. Pt states she also fell last night due to dizziness and hit her face with that fall. Patient currently denies pain or complaints.   Denies SOB, CP, LOC, visual disturbances, fever/recent illness, or any other complaints.   Patient had a left heart cath on 06/19/2016. 2 vessel PCI of D1 and OM1 with extensive angioplasty of in-stent restenosis in the diagonal branch performed on 07/22/2016 by Caroline [name]. Patient had an echo performed on 07/04/2016 that showed severe mitral regurgitation that was likely functional.  Sees Caroline [name] from [facility] cardiology.  Past Medical History:  Diagnosis Date  . Angina   . Anxiety   . Arthritis    "severe in my back" (07/02/2016)  . Atrial fibrillation ([place])   . Bipolar disorder ([place])   . Bright disease   . CAD (coronary artery disease)    [place]  [place]  Diagonal instent restenosis treated 10/2005  . Cancer of right breast ([place]) 2001-02-10  . Carotid artery occlusion   . CHF (congestive heart failure) ([place])    states she was told Caroline [name] had this in past by MD  . Chronic back pain   . Depression    02/10/02 had shock treatment due to depresson, spouse passed away 02/11/00  . Dyslipidemia   . GERD (gastroesophageal reflux disease)   . Graves' disease    /notes 06/03/2010  . Heart murmur    states years ago  . Hypertension    states a long time ago-states she never took medication  . Non Q wave myocardial infarction ([place]) 03/2004   Caroline [name] 06/03/2010   . Shortness of breath    with exertion    Patient Active Problem List   Diagnosis Date Noted  . Coronary artery disease involving native coronary artery   . CAD S/P percutaneous coronary angioplasty   . Acute on chronic systolic (congestive) heart failure 07/02/2016  . 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 Chronic renal disease, stage 3, moderately decreased glomerular filtration rate (GFR) between 30-59 mL/min/1.73 square meter 06/19/2015  . Adrenal insufficiency ([place])   . Orthostasis 06/11/2015  . Pubic ramus fracture ([place]) 06/09/2015  . Fracture of multiple pubic rami ([place]) 06/09/2015  . Chronic diarrhea 03/21/2015  . Cardiomyopathy, ischemic 01/23/2015  . 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 Chronic low back pain 05/21/2014  . Dizziness and giddiness 05/03/2014  . Difficulty speaking 05/03/2014  . Numbness-Bilateral arm / Leg 05/03/2014  . Aftercare following surgery of the circulatory system, [place] 05/03/2014  . Carotid artery occlusion with infarction ([place]) 04/26/2013  . Weight loss 04/23/2012  . Hot flashes 04/23/2012  . Anemia 04/23/2012  . Breast cancer ([place]) 04/23/2012  . Occlusion and stenosis of carotid artery without mention of cerebral infarction 01/06/2012  . Bruit 12/09/2011  . SMOKER 05/24/2009  . ANXIETY DEPRESSION 02/28/2009  . GRAVES DISEASE 02/17/2009  . Hyperlipidemia 02/17/2009  . Coronary atherosclerosis 02/17/2009  .  GERD 02/17/2009    Past Surgical History:  Procedure Laterality Date  . BREAST LUMPECTOMY Right 2002  . CARDIAC CATHETERIZATION N/A 07/03/2016   Procedure: Right/Left Heart Cath and Coronary Angiography;  Surgeon: Caroline Man, MD;  Location: [facility] CV LAB;  Service: Cardiovascular;  Laterality: N/A;  . CARDIAC CATHETERIZATION N/A 07/22/2016   Procedure: Coronary Stent Intervention;  Surgeon: Caroline Man, MD;  Location: [facility] LAB;  Service: Cardiovascular;  Laterality: N/A;  . CAROTID ENDARTERECTOMY  01/08/12   Left    . CORONARY ANGIOPLASTY  03/2009   Caroline [name] 06/03/2010  . CORONARY ANGIOPLASTY WITH STENT PLACEMENT  03/2004   Caroline [name] 06/03/2010;  . CORONARY ANGIOPLASTY WITH STENT PLACEMENT      "I've got 3 stent in there" (07/02/2016)  . CORONARY STENT PLACEMENT  07/23/2016   STENT PROMUS PREM MR 2.5X12 drug eluting stent was successfully placed, and overlaps previously placed stent.  [name] ENDARTERECTOMY  01/08/2012   Procedure: ENDARTERECTOMY CAROTID;  Surgeon: Caroline Misty, MD;  Location: [facility];  Service: Vascular;  Laterality: Left;  with Dacron patch angioplasty  . FRACTURE SURGERY  2007   Broken Back  . JOINT REPLACEMENT    . MULTIPLE TOOTH EXTRACTIONS  2016   "18 or 19"  . TEE WITHOUT CARDIOVERSION N/A 07/04/2016   Procedure: TRANSESOPHAGEAL ECHOCARDIOGRAM (TEE);  Surgeon: Caroline Dresser, MD;  Location: [facility];  Service: Cardiovascular;  Laterality: N/A;  . TOTAL HIP ARTHROPLASTY Left 2011  . VAGINAL HYSTERECTOMY  1970s   partial    OB History    No data available       Home Medications    Prior to Admission medications   Medication Sig Start Date End Date Taking? Authorizing Provider  aspirin 81 MG tablet Take 1 tablet (81 mg total) by mouth daily. 07/05/16  Yes Caroline G Barrett, PA-C  atorvastatin (LIPITOR) 40 MG tablet Take 1 tablet (40 mg total) by mouth daily at 6 PM. 07/05/16  Yes Caroline G Barrett, PA-C  Black Cohosh 540 MG CAPS Take 540 mg by mouth 2 (two) times daily.   Yes Historical Provider, MD  clonazePAM (KLONOPIN) 1 MG tablet Take 2 mg by mouth 2 (two) times daily.    Yes Historical Provider, MD  clopidogrel (PLAVIX) 75 MG tablet Take ONE (1) tablet by mouth once daily Patient taking differently: Take 75 mg by mouth once daily 07/15/16  Yes Caroline Hector, MD  digoxin 62.5 MCG TABS Take 0.0625 mg by mouth daily. 07/05/16  Yes Caroline G Barrett, PA-C  furosemide (LASIX) 20 MG tablet Take 20 mg by mouth daily as needed for fluid or edema.  07/21/16  Yes Historical Provider, MD   QUEtiapine (SEROQUEL XR) 300 MG 24 hr tablet Take 300 mg by mouth 2 (two) times daily.   Yes Historical Provider, MD  QUEtiapine (SEROQUEL) 400 MG tablet Take 400 mg by mouth at bedtime.   Yes Historical Provider, MD  risperiDONE (RISPERDAL) 2 MG tablet Take 2 mg by mouth 3 (three) times daily.    Yes Historical Provider, MD  spironolactone (ALDACTONE) 25 MG tablet Take 0.5 tablets (12.5 mg total) by mouth daily. 07/16/16 10/14/16 Yes Caroline Dresser, MD  HYDROcodone-acetaminophen [facility]) 10-325 MG tablet Take 1 tablet by mouth every 6 (six) hours as needed. Patient not taking: Reported on 08/09/2016 07/08/16   Brunetta Jeans, PA-C  nitroGLYCERIN (NITROSTAT) 0.4 MG SL tablet Place 1 tablet (0.4 mg total) under the tongue every 5 (five)  minutes as needed for chest pain. 07/05/16   Evelene Croon Barrett, PA-C    Family History Family History  Problem Relation Age of Onset  . Coronary artery disease Father   . Prostate cancer Father     not sure  . Pancreatic cancer Father     not sure  . Cancer Father     Pancreatic and  Prostate  . Heart disease Father     Before age 39 and BPG  . Hyperlipidemia Father   . Heart attack Father   . Hypertension Mother   . Diabetes Sister   . Colon cancer Neg Hx     Social History Social History  Substance Use Topics  . Smoking status: Current Some Day Smoker    Packs/day: 0.75    Years: 49.00    Types: Cigarettes    Last attempt to quit: 08/04/2015  . Smokeless tobacco: Never Used  . Alcohol use No     Allergies   Review of patient's allergies indicates no known allergies.   Review of Systems Review of Systems  Constitutional: Negative for chills and fever.  Respiratory: Negative for shortness of breath.   Cardiovascular: Negative for chest pain.  Gastrointestinal: Negative for abdominal pain, nausea and vomiting.  Neurological: Positive for dizziness (resolved). Negative for syncope, weakness, numbness and headaches.  All other systems  reviewed and are negative.    Physical Exam Updated Vital Signs BP 118/66 (BP Location: Right Arm)   Pulse 77   Temp 97.8 F (36.6 C) (Oral)   Resp 18   SpO2 100%   Physical Exam  Constitutional: She is oriented to person, place, and time. She appears well-developed and well-nourished. No distress.  HENT:  Head: Normocephalic.  Abrasions to the bridge of the nose and under the left nare. Appear to be from patient's fall last night. No active hemorrhage. No facial swelling, instability, crepitus, or deformity. Skull seems to be stable.   Eyes: Conjunctivae and EOM are normal. Pupils are equal, round, and reactive to light.  Neck: Normal range of motion. Neck supple.  Cardiovascular: Normal rate and intact distal pulses.  An irregularly irregular rhythm present.  Pulmonary/Chest: Effort normal and breath sounds normal. No respiratory distress.  Abdominal: Soft. There is no tenderness. There is no guarding.  Musculoskeletal: She exhibits no edema or tenderness.  Full ROM in all extremities and spine. No midline spinal tenderness.   Lymphadenopathy:    She has no cervical adenopathy.  Neurological: She is alert and oriented to person, place, and time.  No sensory deficits. Strength 5/5 in all extremities. Coordination intact. Cranial nerves III-XII grossly intact. No facial droop.   Skin: Skin is warm and dry. Capillary refill takes less than 2 seconds. She is not diaphoretic.  Psychiatric: She has a normal mood and affect. Her behavior is normal.  Nursing note and vitals reviewed.    ED Treatments / Results  Labs (all labs ordered are listed, but only abnormal results are displayed) Labs Reviewed  COMPREHENSIVE METABOLIC PANEL - Abnormal; Notable for the following:       Result Value   Potassium 3.1 (*)    Glucose, Bld 115 (*)    BUN 35 (*)    Creatinine, Ser 2.95 (*)    GFR calc non Af Amer 16 (*)    GFR calc Af Amer 18 (*)    All other components within normal limits    CBC WITH DIFFERENTIAL/PLATELET - Abnormal; Notable for the following:  RBC 3.68 (*)    Hemoglobin 10.5 (*)    HCT 32.5 (*)    All other components within normal limits  I-STAT TROPOININ, ED - Abnormal; Notable for the following:    Troponin i, poc 0.13 (*)    All other components within normal limits  URINALYSIS, ROUTINE W REFLEX MICROSCOPIC (NOT AT [facility])   Hemoglobin  Date Value Ref Range Status  08/09/2016 10.5 (L) 12.0 - 15.0 g/dL Final  07/23/2016 9.0 (L) 12.0 - 15.0 g/dL Final  07/23/2016 9.0 (L) 12.0 - 15.0 g/dL Final  07/22/2016 10.9 (L) 12.0 - 15.0 g/dL Final   BUN  Date Value Ref Range Status  08/09/2016 35 (H) 6 - 20 mg/dL Final  07/23/2016 23 (H) 6 - 20 mg/dL Final  07/22/2016 17 6 - 20 mg/dL Final  07/16/2016 20 6 - 20 mg/dL Final   Creat  Date Value Ref Range Status  06/25/2016 2.01 (H) 0.50 - 0.99 mg/dL Final    Comment:      For patients > or = 63 years of age: The upper reference limit for Creatinine is approximately 13% higher for people identified as African-American.     06/18/2016 2.16 (H) 0.50 - 0.99 mg/dL Final    Comment:      For patients > or = 63 years of age: The upper reference limit for Creatinine is approximately 13% higher for people identified as African-American.     05/18/2014 2.11 (H) 0.50 - 1.10 mg/dL Final  05/10/2014 2.35 (H) 0.50 - 1.10 mg/dL Final   Creatinine, Ser  Date Value Ref Range Status  08/09/2016 2.95 (H) 0.44 - 1.00 mg/dL Final  07/23/2016 1.99 (H) 0.44 - 1.00 mg/dL Final  07/22/2016 2.26 (H) 0.44 - 1.00 mg/dL Final  07/16/2016 2.01 (H) 0.44 - 1.00 mg/dL Final      EKG  EKG Interpretation  Date/Time:  Saturday August 09 2016 14:36:51 EDT Ventricular Rate:  76 PR Interval:    QRS Duration: 104 QT Interval:  432 QTC Calculation: 486 R Axis:   -16 Text Interpretation:  Sinus or ectopic atrial rhythm Prolonged PR interval Borderline left axis deviation Consider anterior infarct Repol abnrm suggests  ischemia, lateral leads SImarl flipped t waves to prior in 1,2,AVF Confirmed by [facility] MD, [place] [phone]) on 08/09/2016 2:51:17 PM       Radiology Dg Chest 2 View  Result Date: 08/09/2016 CLINICAL DATA:  Dizziness and fall.  Ex smoker. EXAM: CHEST  2 VIEW COMPARISON:  06/12/2015. FINDINGS: Heart size upper limits normal. Prominent coronary artery calcification with stents. No active infiltrates or failure no mediastinal masses, effusions, or pneumothorax. Previous RIGHT breast biopsy. Osteopenia. No compression fracture. Similar appearance to priors. IMPRESSION: No active cardiopulmonary disease. Electronically Signed   By: Staci Righter M.D.   On: 08/09/2016 15:53    Procedures Procedures (including critical care time)  Medications Ordered in ED Medications  sodium chloride 0.9 % bolus 1,000 mL (1,000 mLs Intravenous New Bag/Given 08/09/16 1648)  potassium chloride SA (K-DUR,KLOR-CON) CR tablet 40 mEq (not administered)    Orthostatic VS for the past 24 hrs:  BP- Lying Pulse- Lying BP- Sitting Pulse- Sitting BP- Standing at 0 minutes Pulse- Standing at 0 minutes  08/09/16 1626 113/69 77 93/60 80 (!) 63/45 84      Initial Impression / Assessment and Plan / ED Course  I have reviewed the triage vital signs and the nursing notes.  Pertinent labs & imaging results that were available during my care  of the patient were reviewed by me and considered in my medical decision making (see chart for details).  Clinical Course      Patient presents with 2 episodes of dizziness and falls over the last 24 hours. Patient has a recent history of cardiac catheterization and stent placement. Patient is orthostatic here in the ED, however, she was also noted to have chronic orthostasis on a recent discharge summary. Despite this, patient has a positive troponin and appears to be unsafe for discharge based on current information.   3:58 PM Spoke with Dr. Meda Coffee, Cardiologist, who agreed to see  the patient. 4:46 PM Spoke with Dr. Sloan Leiter, hospitalist, who Agreed to admit the patient to telemetry observation.  Findings and plan of care discussed with Courtney Paris, MD. Dr. Sherry Ruffing personally evaluated and examined this patient.  Vitals:   08/09/16 1414 08/09/16 1430 08/09/16 1515  BP: 118/66 110/61 96/62  Pulse: 77 76 78  Resp: 18 19 18   Temp: 97.8 F (36.6 C)    TempSrc: Oral    SpO2: 100% 93% 98%     Final Clinical Impressions(s) / ED Diagnoses   Final diagnoses:  Orthostasis  AKI (acute kidney injury) ([place])  Hypokalemia    New Prescriptions New Prescriptions   No medications on file     Lorayne Bender, Hershal Coria 08/09/16 [place], MD 08/09/16 2021

## 2016-08-09 NOTE — ED Notes (Signed)
Admitting MD ordered to stop 1L bolus and run NS fluid at 75/hr. Pt received 354ml of bolus before ending it.

## 2016-08-09 NOTE — H&P (Addendum)
HISTORY AND PHYSICAL       PATIENT DETAILS Name: Caroline [name] Age: 65 y.o. Sex: female Date of Birth: [birth year] Admit Date: 08/09/2016 [id number], Sandria Manly, PA-C   Patient coming from: Home   CHIEF COMPLAINT:  Lightheadedness and fall  HPI: Caroline [name] is a 65 y.o. female with medical history significant of CAD status post recent PCI in September, chronic systolic heart failure, depression, stage III chronic kidney disease, long-standing history of hypertension with orthostatic changes presented to the ED for evaluation of the above-noted complaints. Please note, patient has a long-standing history of hypertension and orthostatic hypotension has had numerous falls of the past few years. Per family, patient sustained a fall this past Thursday when one of her family members was visiting, she denies any loss of consciousness-she refused to come to the emergency room then. Her daughter visited her today, when she stood up to open the door, she again fell. She again denies loss of consciousness. Her daughter convinced her to come to the emergency room.  Patient denies any fever, nausea, vomiting or diarrhea. She claims that she is eating relatively well. She has a very flat affect and seems depressed-but denies any suicidal or homicidal ideations.   Patient also denies any chest pain or shortness of breath. She does not measure her weights daily, but does not think that she has put on some weight.  ED Course: In the ED, she was found to be orthostatic, creatinine was 2.95 which is slightly higher than her usual baseline, troponins were mildly elevated. Cardiology was consulted, the hospitalist service was asked to admit this patient for further evaluation and treatment.   Note: Lives at: Home Mobility: Independent Chronic Indwelling Foley:no   REVIEW OF SYSTEMS:  Constitutional:   No  weight loss, night sweats,  Fevers, chills, fatigue.  HEENT:    No  headaches, Dysphagia,Tooth/dental problems,Sore throat,  No sneezing, itching, ear ache, nasal congestion, post nasal drip  Cardio-vascular: No chest pain,Orthopnea, PND,lower extremity edema, anasarca, palpitations  GI:  No heartburn, indigestion, abdominal pain, nausea, vomiting, diarrhea, melena or hematochezia  Resp: No shortness of breath, cough, hemoptysis,plueritic chest pain.   Skin:  No rash or lesions.  GU:  No dysuria, change in color of urine, no urgency or frequency.  No flank pain.  Musculoskeletal: No joint pain or swelling.  No decreased range of motion.  No back pain.  Endocrine: No heat intolerance, no cold intolerance, no polyuria, no polydipsia  Psych:   No memory loss.   ALLERGIES:  No Known Allergies  PAST MEDICAL HISTORY: Past Medical History:  Diagnosis Date  . Angina   . Anxiety   . Arthritis    "severe in my back" (07/02/2016)  . Atrial fibrillation ([place])   . Bipolar disorder ([place])   . Bright disease   . CAD (coronary artery disease)    [facility]  Diagonal instent restenosis treated 10/2005  . Cancer of right breast ([place]) 2001/02/21  . Carotid artery occlusion   . CHF (congestive heart failure) ([place])    states she was told se had this in past by MD  . Chronic back pain   . Depression    02/21/2002 had shock treatment due to depresson, spouse passed away 02/22/2000  . Dyslipidemia   . GERD (gastroesophageal reflux disease)   . Graves' disease    /notes 06/03/2010  . Heart murmur    states years ago  . Hypertension  states a long time ago-states she never took medication  . Non Q wave myocardial infarction ([place]) 03/2004   Archie Endo 06/03/2010  . Shortness of breath    with exertion    PAST SURGICAL HISTORY: Past Surgical History:  Procedure Laterality Date  . BREAST LUMPECTOMY Right 2002  . CARDIAC CATHETERIZATION N/A 07/03/2016   Procedure: Right/Left Heart Cath and Coronary Angiography;  Surgeon: Leonie Man, MD;  Location: [facility];  Service: Cardiovascular;  Laterality: N/A;  . CARDIAC CATHETERIZATION N/A 07/22/2016   Procedure: Coronary Stent Intervention;  Surgeon: Leonie Man, MD;  Location: [facility];  Service: Cardiovascular;  Laterality: N/A;  . CAROTID ENDARTERECTOMY  01/08/12   Left  . CORONARY ANGIOPLASTY  03/2009   Archie Endo 06/03/2010  . CORONARY ANGIOPLASTY WITH STENT PLACEMENT  03/2004   Archie Endo 06/03/2010;  . CORONARY ANGIOPLASTY WITH STENT PLACEMENT      "I've got 3 stent in there" (07/02/2016)  . CORONARY STENT PLACEMENT  07/23/2016   STENT PROMUS PREM MR 2.5X12 drug eluting stent was successfully placed, and overlaps previously placed stent.  [name] ENDARTERECTOMY  01/08/2012   Procedure: ENDARTERECTOMY CAROTID;  Surgeon: Mal Misty, MD;  Location: [facility];  Service: Vascular;  Laterality: Left;  with Dacron patch angioplasty  . FRACTURE SURGERY  2007   Broken Back  . JOINT REPLACEMENT    . MULTIPLE TOOTH EXTRACTIONS  2016   "18 or 19"  . TEE WITHOUT CARDIOVERSION N/A 07/04/2016   Procedure: TRANSESOPHAGEAL ECHOCARDIOGRAM (TEE);  Surgeon: Larey Dresser, MD;  Location: [facility];  Service: Cardiovascular;  Laterality: N/A;  . TOTAL HIP ARTHROPLASTY Left 2011  . VAGINAL HYSTERECTOMY  1970s   partial    MEDICATIONS AT HOME: Prior to Admission medications   Medication Sig Start Date End Date Taking? Authorizing Provider  aspirin 81 MG tablet Take 1 tablet (81 mg total) by mouth daily. 07/05/16  Yes Rhonda G Barrett, PA-C  atorvastatin (LIPITOR) 40 MG tablet Take 1 tablet (40 mg total) by mouth daily at 6 PM. 07/05/16  Yes Rhonda G Barrett, PA-C  Black Cohosh 540 MG CAPS Take 540 mg by mouth 2 (two) times daily.   Yes Historical Provider, MD  clonazePAM (KLONOPIN) 1 MG tablet Take 2 mg by mouth 2 (two) times daily.    Yes Historical Provider, MD  clopidogrel (PLAVIX) 75 MG tablet Take ONE (1) tablet by mouth once daily Patient taking differently: Take 75 mg by mouth once daily 07/15/16  Yes Josue Hector, MD  digoxin 62.5 MCG TABS Take 0.0625 mg by mouth daily. 07/05/16  Yes Rhonda G Barrett, PA-C  furosemide (LASIX) 20 MG tablet Take 20 mg by mouth daily as needed for fluid or edema.  07/21/16  Yes Historical Provider, MD  QUEtiapine (SEROQUEL XR) 300 MG 24 hr tablet Take 300 mg by mouth 2 (two) times daily.   Yes Historical Provider, MD  QUEtiapine (SEROQUEL) 400 MG tablet Take 400 mg by mouth at bedtime.   Yes Historical Provider, MD  risperiDONE (RISPERDAL) 2 MG tablet Take 2 mg by mouth 3 (three) times daily.    Yes Historical Provider, MD  spironolactone (ALDACTONE) 25 MG tablet Take 0.5 tablets (12.5 mg total) by mouth daily. 07/16/16 10/14/16 Yes Larey Dresser, MD  nitroGLYCERIN (NITROSTAT) 0.4 MG SL tablet Place 1 tablet (0.4 mg total) under the tongue every 5 (five) minutes as needed for chest pain. 07/05/16   Lonn Georgia, PA-C  FAMILY HISTORY: Family History  Problem Relation Age of Onset  . Coronary artery disease Father   . Prostate cancer Father     not sure  . Pancreatic cancer Father     not sure  . Cancer Father     Pancreatic and  Prostate  . Heart disease Father     Before age 1 and BPG  . Hyperlipidemia Father   . Heart attack Father   . Hypertension Mother   . Diabetes Sister   . Colon cancer Neg Hx      SOCIAL HISTORY:  reports that she has been smoking Cigarettes.  She has a 36.75 pack-year smoking history. She has never used smokeless tobacco. She reports that she does not drink alcohol or use drugs.  PHYSICAL EXAM: Blood pressure 96/62, pulse 78, temperature 97.8 F (36.6 C), temperature source Oral, resp. rate 18, SpO2 98 %.  General appearance :Awake, alert, not in any distress. Her speech is slow, she has a very flat affect-and appears chronically ill appearing.  Eyes:, pupils equally reactive to light and accomodation,no scleral icterus.Pink conjunctiva HEENT: Atraumatic and Normocephalic Neck: supple,. No cervical lymphadenopathy. No  thyromegaly Resp:Good air entry bilaterally, no added sounds  CVS: S1 S2 regular,+ systolic murmur GI: Bowel sounds present, Non tender and not distended with no gaurding, rigidity or rebound.No organomegaly Extremities: B/L Lower Ext shows no edema, both legs are warm to touch Neurology:  speech clear,Non focal, sensation is grossly intact. Psychiatric: Normal judgment and insight. Alert and oriented x 3. Normal mood. Musculoskeletal:.No digital cyanosis Skin:No Rash, warm and dry Wounds:N/A  LABS ON ADMISSION:  I have personally reviewed following labs and imaging studies  CBC:  Recent Labs Lab 08/09/16 1413  WBC 6.8  NEUTROABS 4.6  HGB 10.5*  HCT 32.5*  MCV 88.3  PLT [id number]    Basic Metabolic Panel:  Recent Labs Lab 08/09/16 1413  NA 137  K 3.1*  CL 101  CO2 25  GLUCOSE 115*  BUN 35*  CREATININE 2.95*  CALCIUM 9.0    GFR: CrCl cannot be calculated (Unknown ideal weight.).  Liver Function Tests:  Recent Labs Lab 08/09/16 1413  AST 23  ALT 16  ALKPHOS 73  BILITOT 0.6  PROT 6.5  ALBUMIN 3.5   No results for input(s): LIPASE, AMYLASE in the last 168 hours. No results for input(s): AMMONIA in the last 168 hours.  Coagulation Profile: No results for input(s): INR, PROTIME in the last 168 hours.  Cardiac Enzymes: No results for input(s): CKTOTAL, CKMB, CKMBINDEX, TROPONINI in the last 168 hours.  BNP (last 3 results) No results for input(s): PROBNP in the last 8760 hours.  HbA1C: No results for input(s): HGBA1C in the last 72 hours.  CBG: No results for input(s): GLUCAP in the last 168 hours.  Lipid Profile: No results for input(s): CHOL, HDL, LDLCALC, TRIG, CHOLHDL, LDLDIRECT in the last 72 hours.  Thyroid Function Tests: No results for input(s): TSH, T4TOTAL, FREET4, T3FREE, THYROIDAB in the last 72 hours.  Anemia Panel: No results for input(s): VITAMINB12, FOLATE, FERRITIN, TIBC, IRON, RETICCTPCT in the last 72 hours.  Urine analysis:     Component Value Date/Time   COLORURINE YELLOW 06/12/2015 [place] 06/12/2015 2349   LABSPEC 1.005 06/12/2015 2349   PHURINE 6.5 06/12/2015 2349   GLUCOSEU NEGATIVE 06/12/2015 2349   HGBUR NEGATIVE 06/12/2015 2349   BILIRUBINUR NEGATIVE 06/12/2015 2349   KETONESUR NEGATIVE 06/12/2015 2349   PROTEINUR NEGATIVE 06/12/2015 2349  UROBILINOGEN 0.2 06/12/2015 2349   NITRITE NEGATIVE 06/12/2015 2349   LEUKOCYTESUR NEGATIVE 06/12/2015 2349    Sepsis Labs: Lactic Acid, Venous No results found for: [place]   Microbiology: No results found for this or any previous visit (from the past 240 hour(s)).    RADIOLOGIC STUDIES ON ADMISSION: Dg Chest 2 View  Result Date: 08/09/2016 CLINICAL DATA:  Dizziness and fall.  Ex smoker. EXAM: CHEST  2 VIEW COMPARISON:  06/12/2015. FINDINGS: Heart size upper limits normal. Prominent coronary artery calcification with stents. No active infiltrates or failure no mediastinal masses, effusions, or pneumothorax. Previous RIGHT breast biopsy. Osteopenia. No compression fracture. Similar appearance to priors. IMPRESSION: No active cardiopulmonary disease. Electronically Signed   By: Staci Righter M.D.   On: 08/09/2016 15:53   Ct Head Wo Contrast  Result Date: 08/09/2016 CLINICAL DATA:  Increase falls and dizziness.  Hypotension. EXAM: CT HEAD WITHOUT CONTRAST CT CERVICAL SPINE WITHOUT CONTRAST TECHNIQUE: Multidetector CT imaging of the head and cervical spine was performed following the standard protocol without intravenous contrast. Multiplanar CT image reconstructions of the cervical spine were also generated. COMPARISON:  June 09, 2015 FINDINGS: CT HEAD FINDINGS Brain: No subdural, epidural, or subarachnoid hemorrhage. No mass, mass effect, or midline shift. Ventricles and sulci are prominent but stable. Cerebellum and brainstem are normal. Basal cisterns are patent. No acute cortical ischemia or infarct. Vascular: Calcified atherosclerosis  is seen in the intracranial carotid arteries and the basilar artery. Skull: Normal. Negative for fracture or focal lesion. Sinuses/Orbits: No acute finding. Other: No other abnormalities. CT CERVICAL SPINE FINDINGS Alignment: Normal. Skull base and vertebrae: Mild anterior wedging of C5 is favored to be chronic. No fracture or soft tissue swelling identified. Facet degenerative changes are seen in the upper cervical spine. No acute fractures are identified. Soft tissues and spinal canal: No prevertebral fluid or swelling. No visible canal hematoma. Disc levels: Mild degenerative changes including the upper sixth assessed. Upper chest: Negative. Other: No other abnormalities. IMPRESSION: 1. No acute intracranial abnormality. 2. No evidence of acute fracture or traumatic malalignment. Electronically Signed   By: Dorise Bullion III M.D   On: 08/09/2016 16:54   Ct Cervical Spine Wo Contrast  Result Date: 08/09/2016 CLINICAL DATA:  Increase falls and dizziness.  Hypotension. EXAM: CT HEAD WITHOUT CONTRAST CT CERVICAL SPINE WITHOUT CONTRAST TECHNIQUE: Multidetector CT imaging of the head and cervical spine was performed following the standard protocol without intravenous contrast. Multiplanar CT image reconstructions of the cervical spine were also generated. COMPARISON:  June 09, 2015 FINDINGS: CT HEAD FINDINGS Brain: No subdural, epidural, or subarachnoid hemorrhage. No mass, mass effect, or midline shift. Ventricles and sulci are prominent but stable. Cerebellum and brainstem are normal. Basal cisterns are patent. No acute cortical ischemia or infarct. Vascular: Calcified atherosclerosis is seen in the intracranial carotid arteries and the basilar artery. Skull: Normal. Negative for fracture or focal lesion. Sinuses/Orbits: No acute finding. Other: No other abnormalities. CT CERVICAL SPINE FINDINGS Alignment: Normal. Skull base and vertebrae: Mild anterior wedging of C5 is favored to be chronic. No fracture or  soft tissue swelling identified. Facet degenerative changes are seen in the upper cervical spine. No acute fractures are identified. Soft tissues and spinal canal: No prevertebral fluid or swelling. No visible canal hematoma. Disc levels: Mild degenerative changes including the upper sixth assessed. Upper chest: Negative. Other: No other abnormalities. IMPRESSION: 1. No acute intracranial abnormality. 2. No evidence of acute fracture or traumatic malalignment. Electronically Signed   By: Shanon Brow  Jimmye Norman III M.D   On: 08/09/2016 16:54    I have personally reviewed images of chest xray or the CT head  EKG:  Personally reviewed. Sinus rhythm  ASSESSMENT AND PLAN: Present on Admission: . Syncope/fall: Has a long standing history of orthostatic hypotension and frequent falls/syncope. By history this episode of fall sounds more like orthostatic hypotension. She will be admitted and monitored in the telemetry unit. Cardiology is following, spoke with Dr. Hortencia Pilar plan to consult CHF team for further assistance. Will go ahead and place the patient on TED hose for now.  . Acute kidney injury superimposed on chronic kidney disease stage III: Suspect prerenal azotemia, gently hydrate overnight. Hold Aldactone until tomorrow. Repeat electrolytes in a.m.  . History of hypotension and orthostatic hypertension: Chronic issue-it appears in the past she was on hydrocortisone for presumed adrenal insufficiency-it appears that this has now been ruled out and she is no longer taking hydrocortisone. Place TED hose, recheck orthostatics in the morning, await further cardiology input.  [name] CAD status post PCI September 2017 with minimal troponin elevation:continue aspirin, Plavix and Lipitor. Not a candidate for beta blocker given hypertension/orthostatics. Troponin minimally elevated-could be from demand ischemia or worsening renal function. Plans are to cycle troponins.  . History of chronic systolic heart failure:  Most recent TTE on August 2017 showed EF around 25-30%-she underwent PCI in September-to see if revascularization would improve her EF. Currently she appears fairly euvolemic, cardiology plans to get CHF team input tomorrow morning. Will check digoxin level.  . Severe mitral regurgitation: Chronic issue-per last discharge summary from cardiology this appears to be severe and functional. Defer further to cardiology.  . Failure to thrive syndrome: I suspect this is secondary to multiple medical comorbidities-and possibly ongoing depression as well. She appears to have some amount of cardiac cachexia .Will obtain psychiatry consultation, PT evaluation. May need SNF on discharge.  [name] ANXIETY/ DEPRESSION: Very flat affect, continue Seroquel and Risperdal. Will get psychiatry eval while inpatient.  Further plan will depend as patient's clinical course evolves and further radiologic and laboratory data become available. Patient will be monitored closely.  Above noted plan was discussed with patient/daughter face to face at bedside, they were in agreement.   CONSULTS: Cardiology  DVT Prophylaxis: Prophylactic Lovenox   Code Status: DNR-confirmed with patient-with daughter at bedside.  Disposition Plan:  Discharge back home vs SNF possibly in 1-2 days, depending on clinical course  Admission status: Oobservation going to tele  Total time spent  55 minutes.Greater than 50% of this time was spent in counseling, explanation of diagnosis, planning of further management, and coordination of care.  [facility] Pager [phone]  If 7PM-7AM, please contact night-coverage [email or web address] Password [email or web address] 08/09/2016, 5:35 PM

## 2016-08-09 NOTE — ED Notes (Signed)
Valorie Roosevelt PA notified of orthostatics and is going to order 1L bolus to be given over 2 hours due to pt hx of CHF

## 2016-08-09 NOTE — ED Notes (Signed)
Pt returns from ct scan. 

## 2016-08-09 NOTE — Consult Note (Signed)
CARDIOLOGY CONSULT NOTE   Patient ID: Caroline [name] MRN: [id number], DOB/AGE: 04-23-1951 (65)   Admit date: 08/09/2016 Date of Consult: 08/09/2016  Primary Physician: [name], PA-C Primary Cardiologist: Dr Aundra Dubin  Reason for consult:  Recurrent dizziness with falls  Problem List  Past Medical History:  Diagnosis Date  . Angina   . Anxiety   . Arthritis    "severe in my back" (07/02/2016)  . Atrial fibrillation ([place])   . Bipolar disorder ([place])   . Bright disease   . CAD (coronary artery disease)    [facility]  Diagonal instent restenosis treated 10/2005  . Cancer of right breast ([place]) February 12, 2001  . Carotid artery occlusion   . CHF (congestive heart failure) ([place])    states she was told se had this in past by MD  . Chronic back pain   . Depression    02/12/2002 had shock treatment due to depresson, spouse passed away 2000-02-13  . Dyslipidemia   . GERD (gastroesophageal reflux disease)   . Graves' disease    /notes 06/03/2010  . Heart murmur    states years ago  . Hypertension    states a long time ago-states she never took medication  . Non Q wave myocardial infarction ([place]) 03/2004   Archie Endo 06/03/2010  . Shortness of breath    with exertion    Past Surgical History:  Procedure Laterality Date  . BREAST LUMPECTOMY Right Feb 12, 2001  . CARDIAC CATHETERIZATION N/A 07/03/2016   Procedure: Right/Left Heart Cath and Coronary Angiography;  Surgeon: Leonie Man, MD;  Location: [facility];  Service: Cardiovascular;  Laterality: N/A;  . CARDIAC CATHETERIZATION N/A 07/22/2016   Procedure: Coronary Stent Intervention;  Surgeon: Leonie Man, MD;  Location: [facility];  Service: Cardiovascular;  Laterality: N/A;  . CAROTID ENDARTERECTOMY  01/08/12   Left  . CORONARY ANGIOPLASTY  03/2009   Archie Endo 06/03/2010  . CORONARY ANGIOPLASTY WITH STENT PLACEMENT  03/2004   Archie Endo 06/03/2010;  . CORONARY ANGIOPLASTY WITH STENT PLACEMENT      "I've got 3 stent in there" (07/02/2016)  .  CORONARY STENT PLACEMENT  07/23/2016   STENT PROMUS PREM MR 2.5X12 drug eluting stent was successfully placed, and overlaps previously placed stent.  [name] ENDARTERECTOMY  01/08/2012   Procedure: ENDARTERECTOMY CAROTID;  Surgeon: Mal Misty, MD;  Location: [facility];  Service: Vascular;  Laterality: Left;  with Dacron patch angioplasty  . FRACTURE SURGERY  02/12/2006   Broken Back  . JOINT REPLACEMENT    . MULTIPLE TOOTH EXTRACTIONS  February 13, 2015   "18 or 19"  . TEE WITHOUT CARDIOVERSION N/A 07/04/2016   Procedure: TRANSESOPHAGEAL ECHOCARDIOGRAM (TEE);  Surgeon: Larey Dresser, MD;  Location: [facility];  Service: Cardiovascular;  Laterality: N/A;  . TOTAL HIP ARTHROPLASTY Left 2010-02-12  . VAGINAL HYSTERECTOMY  1970s   partial     Allergies  No Known Allergies  HPI   65 yo with history of severe depression, CKD stage III, carotid stenosis, CAD, and chronic systolic CHF, LVEF [id number], Severe diffuse CAD, severe mitral regurgitation. She recently had right and left heart cath (8/17), showing optimized LV and RV filling pressures and relatively preserved cardiac output. She subsequently had a TEE in 8/17 showed EF 25-30% with severe functional MR.  She has had problems with hypotension and orthostatic hypotension and was only taking digoxin and recently low-dose spironolactone was added. Dr. Aundra Dubin felt that revascularization may help improve EF,  but just  underwent PCI with drug eluting stent to second diagonal vessel, and PCI with drug-eluting stent to first OM on 07/22/2016.  She presented to the ER today after 2 falls, she states that she was walking after sitting for a while and went to the floor, on one occasion he is her head in the back. She mixes up the days and doesn't remember which day exactly happened but her daughter helps to obtain the history. The patient appears very fatigue she is falling asleep during conversation. She is hard time remembering most recent events. She also states that she had a poor  appetite and hasn't been eating or drinking much. She denies any chest pain or shortness of breath no lower extremity edema.  Inpatient Medications  . potassium chloride  40 mEq Oral Once    Family History Family History  Problem Relation Age of Onset  . Coronary artery disease Father   . Prostate cancer Father     not sure  . Pancreatic cancer Father     not sure  . Cancer Father     Pancreatic and  Prostate  . Heart disease Father     Before age 8 and BPG  . Hyperlipidemia Father   . Heart attack Father   . Hypertension Mother   . Diabetes Sister   . Colon cancer Neg Hx      Social History Social History   Social History  . Marital status: Widowed    Spouse name: N/A  . Number of children: N/A  . Years of education: N/A   Occupational History  . Not on file.   Social History Main Topics  . Smoking status: Current Some Day Smoker    Packs/day: 0.75    Years: 49.00    Types: Cigarettes    Last attempt to quit: 08/04/2015  . Smokeless tobacco: Never Used  . Alcohol use No  . Drug use: No  . Sexual activity: No   Other Topics Concern  . Not on file   Social History Narrative   She has 1 child.  She is on disability.     Review of Systems  General:  No chills, fever, night sweats or weight changes.  Cardiovascular:  No chest pain, dyspnea on exertion, edema, orthopnea, palpitations, paroxysmal nocturnal dyspnea. Dermatological: No rash, lesions/masses Respiratory: No cough, dyspnea Urologic: No hematuria, dysuria Abdominal:   No nausea, vomiting, diarrhea, bright red blood per rectum, melena, or hematemesis Neurologic:  No visual changes, wkns, changes in mental status. All other systems reviewed and are otherwise negative except as noted above.  Physical Exam  Blood pressure 96/62, pulse 78, temperature 97.8 F (36.6 C), temperature source Oral, resp. rate 18, SpO2 98 %.  General: Somnolent Psych: Normal affect. Neuro: Alert and oriented X 3.  Moves all extremities spontaneously. HEENT: Normal  Neck: Supple without bruits or JVD. Lungs:  Resp regular and unlabored, CTA. Heart: RRR no s3, s4, loud 4/6 holosystolic murmur. Abdomen: Soft, non-tender, non-distended, BS + x 4.  Extremities: No clubbing, cyanosis or edema. DP/PT/Radials 2+ and equal bilaterally.  Labs  No results for input(s): CKTOTAL, CKMB, TROPONINI in the last 72 hours. Lab Results  Component Value Date   WBC 6.8 08/09/2016   HGB 10.5 (L) 08/09/2016   HCT 32.5 (L) 08/09/2016   MCV 88.3 08/09/2016   PLT 269 08/09/2016    Recent Labs Lab 08/09/16 1413  NA 137  K 3.1*  CL 101  CO2 25  BUN 35*  CREATININE 2.95*  CALCIUM 9.0  PROT 6.5  BILITOT 0.6  ALKPHOS 73  ALT 16  AST 23  GLUCOSE 115*   Lab Results  Component Value Date   CHOL 264 (H) 04/23/2012   HDL 67 04/23/2012   LDLCALC 172 (H) 04/23/2012   TRIG 126 04/23/2012   Lab Results  Component Value Date   DDIMER  03/31/2009    0.46        AT THE INHOUSE ESTABLISHED CUTOFF VALUE OF 0.48 ug/mL FEU, THIS ASSAY HAS BEEN DOCUMENTED IN THE LITERATURE TO HAVE A SENSITIVITY AND NEGATIVE PREDICTIVE VALUE OF AT LEAST 98 TO 99%.  THE TEST RESULT SHOULD BE CORRELATED WITH AN ASSESSMENT OF THE CLINICAL PROBABILITY OF DVT / VTE.   Invalid input(s): POCBNP  Radiology/Studies  Dg Chest 2 View  Result Date: 08/09/2016 CLINICAL DATA:  Dizziness and fall.  Ex smoker. EXAM: CHEST  2 VIEW COMPARISON:  06/12/2015. FINDINGS: Heart size upper limits normal. Prominent coronary artery calcification with stents. No active infiltrates or failure no mediastinal masses, effusions, or pneumothorax. Previous RIGHT breast biopsy. Osteopenia. No compression fracture. Similar appearance to priors. IMPRESSION: No active cardiopulmonary disease. Electronically Signed   By: Staci Righter M.D.   On: 08/09/2016 15:53   Echocardiogram   ECG:   LHC: 07/03/2016  There is no aortic valve stenosis.  1st Mrg lesion, 80  %stenosed just prior to its branch point  2nd Diag-1 lesion, 95 %stenosed just prior to the stented segment.  2nd Diag-2 overlapping stents, 35 %stenosed.  LV end diastolic pressure is normal.  Right heart catheter pressures are essentially normal. No pulmonary hypertension. Low/normal RV EDP, PCWP, RAP & RVP  Moderate to severely reduced output/index. No large V wave from potential MR  Patient has severe 2 vessel disease with recurrent pre-and in-stent restenosis in the diagonal branch as well as a new lesion in the Circumflex-OM branch. Severely reduced LV function/EF and cardiac index are out of proportion with extent of CAD. 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  Plan:  Return to nursing unit for post catheterization care.  Would hold off on further IV diuresis.  Need to determine mitral regurgitation status in order to decide whether or not the patient would require redo PCI on 2 lesions versus CABG with MVR.  LHC: 07/22/16  Lesion #1: 2nd Diag-2 lesion, 95 %stenosed - just proximal to previous stents with ~50% ISR  A STENT PROMUS PREM MR 2.5X12 drug eluting stent was successfully placed, and overlaps previously placed stent.  Post intervention, there is a 0% residual stenosis.  Lesion #2: 1st Mrg lesion, 80 %stenosed.  A STENT PROMUS PREM MR 2.5X12 drug eluting stent was successfully placed.  Post intervention, there is a 0% residual stenosis. Successful 2 vessel PCI of D1 and OM1 with extensive angioplasty of in-stent restenosis in the diagonal branch. Very difficult to image the circumflex OM lesion.  TTE: 06/25/2016 - Left ventricle: The cavity size was moderately dilated. Wall   thickness was normal. The estimated ejection fraction was 25%.   Diffuse hypokinesis. Doppler parameters are consistent with   restrictive physiology, indicative of decreased left ventricular   diastolic compliance and/or increased left atrial pressure.    E/medial e&' > 15 suggests LV end diastolic pressure at least 20   mmHg. - Aortic valve: There was no stenosis. There was mild   regurgitation. - Mitral valve: Mildly calcified annulus. There was severe   regurgitation, suspect functional due to annular dilatation. - Left  atrium: The atrium was moderately dilated. - Right ventricle: The cavity size was normal. Systolic function   was normal. - Tricuspid valve: Peak RV-RA gradient (S): 23 mm Hg. - Pulmonary arteries: PA peak pressure: 26 mm Hg (S). - Inferior vena cava: The vessel was normal in size. The   respirophasic diameter changes were in the normal range (= 50%),   consistent with normal central venous pressure.  Impressions: - Moderate dilated LV with diffuse hypokinesis, EF 25%. Restrictive   diastolic function with evidence for elevated LV filling   pressure. Normal RV size and systolic function. Severe mitral   regurgitation, likely functional due to annular dilatation.   Consider HF clinic referral.     ASSESSMENT AND PLAN  1. Recurrent dizziness and falls, secondary to baseline hypotension and orthostatic hypotension secondary to poor LVEF and poor oral intake.  The patient appears to be rather dehydrated, she is receiving low-dose of fluids considering her reserve is very low. She appears cachectic and fatigue. She recently underwent right cardiac catheterization with relatively preserved cardiac output. I will ask help of heart failure service to decide whether any advanced heart failure therapies would be advised in this case or patient should be consider for palliative care.  2. Chronic systolic CHF: TEE (0000000) with EF 25-30%, diffuse hypokinesis. Suspect mixed ischemic/nonischemic cardiomyopathy. She has had trouble tolerating cardiac meds due to orthostatic hypotension. She does not appear volume overloaded on exam, she is rather dry and receiving low-dose of IV fluids and recent RHC showed normal right and left  heart filling pressures with relatively preserved cardiac output.  - Continue digoxin 0.0625 mg daily  3. CAD: Recent staged PCI to OM1 and D2 on 07/22/16 with no significant improvement in her symptoms with regards to hypotension.  - Continue ASA, Plavix, and atorvastatin. No BB 2/2 hypotension  4. CKD: Stage III. Has history of Bright's disease. Baseline creatinine 1.7-2, today 2.95 most probably result of poor cardiac output then hydrated and, we will give very careful hydration.  5. Carotid stenosis: RICA occluded, has had left CEA.   5. MItral regurgitation: Severe functional MR. Benefit to repair is questionable. Mitraclip would be consideration but surgical repair likely would be of prohibitive risk (and questionable benefit).   6. Poor functional status: Appears to be from combination of depression and end-stage heart failure she appears cachectic, palliative care consult should be considered..  7. Chronic anemia: Stable  Signed, Ena Dawley, MD, [facility] 08/09/2016, 4:44 PM

## 2016-08-09 NOTE — ED Triage Notes (Signed)
Pt reports to the ED for eval of increased falls, dizziness with standing, hypotension (0000000 systolic on EMS arrival), and recent stent placement. Pt reports she fell and landed on her face on Thursday. She has some bruising on her face. Pt also has some scabbing at her left nare. No active bleeding. Today she fell again and hit her posterior head. No obvious swelling/hematoma. Pt denies any LOC. She is on blood thinners (plavix). She passed SCCA PTA. Pt was having ventricular bigeminy on the monitor that would transition back to NSR with 1st degree AV block and LVH. Denies any pain. Pt A&Ox4, resp e/u, and skin warm and dry.

## 2016-08-09 NOTE — ED Notes (Signed)
During orthostatics pt stated that she felt slightly lightheaded while sitting up and dizzy while standing.

## 2016-08-10 DIAGNOSIS — R42 Dizziness and giddiness: Secondary | ICD-10-CM | POA: Diagnosis not present

## 2016-08-10 DIAGNOSIS — F3175 Bipolar disorder, in partial remission, most recent episode depressed: Secondary | ICD-10-CM | POA: Diagnosis not present

## 2016-08-10 DIAGNOSIS — Z8249 Family history of ischemic heart disease and other diseases of the circulatory system: Secondary | ICD-10-CM

## 2016-08-10 DIAGNOSIS — Z79899 Other long term (current) drug therapy: Secondary | ICD-10-CM | POA: Diagnosis not present

## 2016-08-10 DIAGNOSIS — I2510 Atherosclerotic heart disease of native coronary artery without angina pectoris: Secondary | ICD-10-CM | POA: Diagnosis not present

## 2016-08-10 DIAGNOSIS — N179 Acute kidney failure, unspecified: Secondary | ICD-10-CM | POA: Diagnosis not present

## 2016-08-10 DIAGNOSIS — F341 Dysthymic disorder: Secondary | ICD-10-CM

## 2016-08-10 DIAGNOSIS — Z833 Family history of diabetes mellitus: Secondary | ICD-10-CM | POA: Diagnosis not present

## 2016-08-10 DIAGNOSIS — I255 Ischemic cardiomyopathy: Secondary | ICD-10-CM | POA: Diagnosis not present

## 2016-08-10 DIAGNOSIS — Z808 Family history of malignant neoplasm of other organs or systems: Secondary | ICD-10-CM | POA: Diagnosis not present

## 2016-08-10 DIAGNOSIS — I5022 Chronic systolic (congestive) heart failure: Secondary | ICD-10-CM | POA: Diagnosis not present

## 2016-08-10 DIAGNOSIS — F17210 Nicotine dependence, cigarettes, uncomplicated: Secondary | ICD-10-CM | POA: Diagnosis not present

## 2016-08-10 DIAGNOSIS — N183 Chronic kidney disease, stage 3 (moderate): Secondary | ICD-10-CM | POA: Diagnosis not present

## 2016-08-10 DIAGNOSIS — N189 Chronic kidney disease, unspecified: Secondary | ICD-10-CM | POA: Diagnosis not present

## 2016-08-10 LAB — CBC
HCT: 29.2 % — ABNORMAL LOW (ref 36.0–46.0)
HEMOGLOBIN: 9.2 g/dL — AB (ref 12.0–15.0)
MCH: 28 pg (ref 26.0–34.0)
MCHC: 31.5 g/dL (ref 30.0–36.0)
MCV: 88.8 fL (ref 78.0–100.0)
Platelets: 249 10*3/uL (ref 150–400)
RBC: 3.29 MIL/uL — ABNORMAL LOW (ref 3.87–5.11)
RDW: 14.5 % (ref 11.5–15.5)
WBC: 6 10*3/uL (ref 4.0–10.5)

## 2016-08-10 LAB — GLUCOSE, CAPILLARY
GLUCOSE-CAPILLARY: 88 mg/dL (ref 65–99)
Glucose-Capillary: 168 mg/dL — ABNORMAL HIGH (ref 65–99)
Glucose-Capillary: 145 mg/dL — ABNORMAL HIGH (ref 65–99)

## 2016-08-10 LAB — BASIC METABOLIC PANEL
Anion gap: 11 (ref 5–15)
BUN: 26 mg/dL — ABNORMAL HIGH (ref 6–20)
CALCIUM: 8.9 mg/dL (ref 8.9–10.3)
CHLORIDE: 102 mmol/L (ref 101–111)
CO2: 27 mmol/L (ref 22–32)
CREATININE: 2.12 mg/dL — AB (ref 0.44–1.00)
GFR calc non Af Amer: 24 mL/min — ABNORMAL LOW (ref >60)
GFR, EST AFRICAN AMERICAN: 27 mL/min — AB (ref >60)
Glucose, Bld: 88 mg/dL (ref 65–99)
Potassium: 2.9 mmol/L — ABNORMAL LOW (ref 3.5–5.1)
SODIUM: 140 mmol/L (ref 135–145)

## 2016-08-10 LAB — MAGNESIUM: Magnesium: 1.9 mg/dL (ref 1.7–2.4)

## 2016-08-10 LAB — TROPONIN I
TROPONIN I: 0.09 ng/mL — AB (ref <0.03)
TROPONIN I: 0.09 ng/mL — AB (ref <0.03)

## 2016-08-10 LAB — DIGOXIN LEVEL: Digoxin Level: 0.7 ng/mL — ABNORMAL LOW (ref 0.8–2.0)

## 2016-08-10 MED ORDER — CLONAZEPAM 0.5 MG PO TABS
1.0000 mg | ORAL_TABLET | Freq: Two times a day (BID) | ORAL | Status: DC
  Administered 2016-08-10 – 2016-08-13 (×6): 1 mg via ORAL
  Filled 2016-08-10 (×6): qty 2

## 2016-08-10 MED ORDER — POTASSIUM CHLORIDE CRYS ER 20 MEQ PO TBCR
40.0000 meq | EXTENDED_RELEASE_TABLET | Freq: Two times a day (BID) | ORAL | Status: AC
  Administered 2016-08-10 (×2): 40 meq via ORAL
  Filled 2016-08-10 (×2): qty 2

## 2016-08-10 MED ORDER — FLUOXETINE HCL 10 MG PO CAPS
10.0000 mg | ORAL_CAPSULE | Freq: Every day | ORAL | Status: DC
  Administered 2016-08-11 – 2016-08-13 (×3): 10 mg via ORAL
  Filled 2016-08-10 (×3): qty 1

## 2016-08-10 MED ORDER — DIGOXIN 125 MCG PO TABS
0.0625 mg | ORAL_TABLET | Freq: Every day | ORAL | Status: DC
  Administered 2016-08-10 – 2016-08-13 (×4): 0.0625 mg via ORAL
  Filled 2016-08-10 (×3): qty 1

## 2016-08-10 NOTE — Progress Notes (Signed)
PROGRESS NOTE    Caroline [name]  [id number] DOB: 1951-04-15 DOA: 08/09/2016 PCP: [name], PA-C   Chief Complaint  Patient presents with  . Fall  . Dizziness    Brief Narrative:  HPI on 08/09/2016 by Dr. Oren Binet Caroline [name] is a 65 y.o. female with medical history significant of CAD status post recent PCI in September, chronic systolic heart failure, depression, stage III chronic kidney disease, long-standing history of hypertension with orthostatic changes presented to the ED for evaluation of the above-noted complaints. Please note, patient has a long-standing history of hypertension and orthostatic hypotension has had numerous falls of the past few years. Per family, patient sustained a fall this past Thursday when one of her family members was visiting, she denies any loss of consciousness-she refused to come to the emergency room then. Her daughter visited her today, when she stood up to open the door, she again fell. She again denies loss of consciousness. Her daughter convinced her to come to the emergency room. Patient denies any fever, nausea, vomiting or diarrhea. She claims that she is eating relatively well. She has a very flat affect and seems depressed-but denies any suicidal or homicidal ideations. Patient also denies any chest pain or shortness of breath. She does not measure her weights daily, but does not think that she has put on some weight.  Assessment & Plan   Syncope/fall -Patient has a long-standing history of orthostatic hypotension with recurrent falls -Patient has been followed by cardiology as an outpatient -Cardiology consulted and appreciated -Continue TED hose  Acute kidney injury superimposed on chronic kidney disease, stage III -Likely secondary dehydration and poor oral intake vs meds -Aldactone held -Creatinine upon admission 2.95,currently 2.12 -Continue to monitor BMP  Hypotension/orthostatic hypotension -Has been a chronic  issue, patient has been on hydrocortisone in the past with presumed adrenal insufficiency. Patient is no longer taking hydrocortisone. -Cardiology consulted and appreciated  Coronary artery disease/ troponin elevation -Patient did have PCI September 2017 -Continue aspirin, Plavix, Lipitor -Patient is not a candidate for beta blocker given her hypotension and orthostasis -Troponin may be elevated secondary to demand ischemia or worsening renal function  Chronic systolic heart failure -Echocardiogram August 2017 shows EF of 25-30% -Patient currently appears to be euvolemic -Patient currently not on an ACE inhibitor or an ARB, beta blocker due to her hypotension and renal disease -Continue digoxin. Level   Anxiety/depression -Continue Seroquel, risperdal, klonopin  -Psychiatry consulted and appreciated, pending evaluation  Failure to thrive -Possibly secondary to the above, comorbidities, depression and anxiety -PT consulted and appreciated -Will consult nutrition   DVT Prophylaxis  lovenox  Code Status: DNR  Family Communication: None at bedside  Disposition Plan: Currently in observation. Pending workup and PT evaluation.  Possibly SNF at discharge.   Consultants Cardiology Psychiatry  Procedures  None  Antibiotics   Anti-infectives    None      Subjective:   Caroline [name] seen and examined today.   Patient has no complaints this morning. His chest pain, shortness of breath, abdominal pain, nausea or vomiting, dizziness or headache. Patient endorsed dizziness prior to falling.   Objective:   Vitals:   08/09/16 2149 08/09/16 2356 08/10/16 0521 08/10/16 0804  BP: 140/87 (!) 109/50 91/75 (!) 112/50  Pulse: 74 72 (!) 51 66  Resp: 18 18 16 18   Temp: 97.5 F (36.4 C) 97.6 F (36.4 C) 97.7 F (36.5 C) 98 F (36.7 C)  TempSrc: Oral Oral Oral Oral  SpO2: 100% 100% 95%   Weight:   52.9 kg (116 lb 9.6 oz)   Height:        Intake/Output Summary (Last 24 hours) at  08/10/16 1144 Last data filed at 08/10/16 0900  Gross per 24 hour  Intake          1213.34 ml  Output              500 ml  Net           713.34 ml   Filed Weights   08/09/16 1837 08/10/16 0521  Weight: 52.4 kg (115 lb 8.3 oz) 52.9 kg (116 lb 9.6 oz)    Exam  General: Well developed, well nourished, NAD, appears stated age  64: NCAT, bruising noted on left nasal bridge, mucous membranes moist.   Neck: Supple, no JVD, no masses  Cardiovascular: S1 S2 auscultated, 2/6SEM, RRR  Respiratory: Clear to auscultation bilaterally with equal chest rise  Abdomen: Soft, nontender, nondistended, + bowel sounds  Extremities: warm dry without cyanosis clubbing or edema  Neuro: AAOx3, nonfocal   Skin: Without rashes exudates or nodules  Psych: Flat affect   Data Reviewed: I have personally reviewed following labs and imaging studies  CBC:  Recent Labs Lab 08/09/16 1413 08/10/16 0624  WBC 6.8 6.0  NEUTROABS 4.6  --   HGB 10.5* 9.2*  HCT 32.5* 29.2*  MCV 88.3 88.8  PLT 269 0000000   Basic Metabolic Panel:  Recent Labs Lab 08/09/16 1413 08/10/16 0624 08/10/16 0944  NA 137 140  --   K 3.1* 2.9*  --   CL 101 102  --   CO2 25 27  --   GLUCOSE 115* 88  --   BUN 35* 26*  --   CREATININE 2.95* 2.12*  --   CALCIUM 9.0 8.9  --   MG  --   --  1.9   GFR: Estimated Creatinine Clearance (by C-G formula based on SCr of 2.12 mg/dL (H)) Female: 22.5 mL/min Female: 26.7 mL/min Liver Function Tests:  Recent Labs Lab 08/09/16 1413  AST 23  ALT 16  ALKPHOS 73  BILITOT 0.6  PROT 6.5  ALBUMIN 3.5   No results for input(s): LIPASE, AMYLASE in the last 168 hours. No results for input(s): AMMONIA in the last 168 hours. Coagulation Profile: No results for input(s): INR, PROTIME in the last 168 hours. Cardiac Enzymes:  Recent Labs Lab 08/09/16 1914 08/10/16 0203 08/10/16 0624  TROPONINI 0.10* 0.09* 0.09*   BNP (last 3 results) No results for input(s): PROBNP in the  last 8760 hours. HbA1C: No results for input(s): HGBA1C in the last 72 hours. CBG:  Recent Labs Lab 08/10/16 0731 08/10/16 1116  GLUCAP 88 168*   Lipid Profile: No results for input(s): CHOL, HDL, LDLCALC, TRIG, CHOLHDL, LDLDIRECT in the last 72 hours. Thyroid Function Tests: No results for input(s): TSH, T4TOTAL, FREET4, T3FREE, THYROIDAB in the last 72 hours. Anemia Panel: No results for input(s): VITAMINB12, FOLATE, FERRITIN, TIBC, IRON, RETICCTPCT in the last 72 hours. Urine analysis:    Component Value Date/Time   COLORURINE YELLOW 06/12/2015 Victor 06/12/2015 2349   LABSPEC 1.005 06/12/2015 2349   PHURINE 6.5 06/12/2015 2349   GLUCOSEU NEGATIVE 06/12/2015 2349   HGBUR NEGATIVE 06/12/2015 2349   BILIRUBINUR NEGATIVE 06/12/2015 2349   KETONESUR NEGATIVE 06/12/2015 2349   PROTEINUR NEGATIVE 06/12/2015 2349   UROBILINOGEN 0.2 06/12/2015 2349   NITRITE NEGATIVE 06/12/2015 2349   LEUKOCYTESUR NEGATIVE  06/12/2015 2349   Sepsis Labs: @LABRCNTIP (procalcitonin:4,lacticidven:4)  )No results found for this or any previous visit (from the past 240 hour(s)).    Radiology Studies: Dg Chest 2 View  Result Date: 08/09/2016 CLINICAL DATA:  Dizziness and fall.  Ex smoker. EXAM: CHEST  2 VIEW COMPARISON:  06/12/2015. FINDINGS: Heart size upper limits normal. Prominent coronary artery calcification with stents. No active infiltrates or failure no mediastinal masses, effusions, or pneumothorax. Previous RIGHT breast biopsy. Osteopenia. No compression fracture. Similar appearance to priors. IMPRESSION: No active cardiopulmonary disease. Electronically Signed   By: Staci Righter M.D.   On: 08/09/2016 15:53   Ct Head Wo Contrast  Result Date: 08/09/2016 CLINICAL DATA:  Increase falls and dizziness.  Hypotension. EXAM: CT HEAD WITHOUT CONTRAST CT CERVICAL SPINE WITHOUT CONTRAST TECHNIQUE: Multidetector CT imaging of the head and cervical spine was performed following the  standard protocol without intravenous contrast. Multiplanar CT image reconstructions of the cervical spine were also generated. COMPARISON:  June 09, 2015 FINDINGS: CT HEAD FINDINGS Brain: No subdural, epidural, or subarachnoid hemorrhage. No mass, mass effect, or midline shift. Ventricles and sulci are prominent but stable. Cerebellum and brainstem are normal. Basal cisterns are patent. No acute cortical ischemia or infarct. Vascular: Calcified atherosclerosis is seen in the intracranial carotid arteries and the basilar artery. Skull: Normal. Negative for fracture or focal lesion. Sinuses/Orbits: No acute finding. Other: No other abnormalities. CT CERVICAL SPINE FINDINGS Alignment: Normal. Skull base and vertebrae: Mild anterior wedging of C5 is favored to be chronic. No fracture or soft tissue swelling identified. Facet degenerative changes are seen in the upper cervical spine. No acute fractures are identified. Soft tissues and spinal canal: No prevertebral fluid or swelling. No visible canal hematoma. Disc levels: Mild degenerative changes including the upper sixth assessed. Upper chest: Negative. Other: No other abnormalities. IMPRESSION: 1. No acute intracranial abnormality. 2. No evidence of acute fracture or traumatic malalignment. Electronically Signed   By: Dorise Bullion III M.D   On: 08/09/2016 16:54   Ct Cervical Spine Wo Contrast  Result Date: 08/09/2016 CLINICAL DATA:  Increase falls and dizziness.  Hypotension. EXAM: CT HEAD WITHOUT CONTRAST CT CERVICAL SPINE WITHOUT CONTRAST TECHNIQUE: Multidetector CT imaging of the head and cervical spine was performed following the standard protocol without intravenous contrast. Multiplanar CT image reconstructions of the cervical spine were also generated. COMPARISON:  June 09, 2015 FINDINGS: CT HEAD FINDINGS Brain: No subdural, epidural, or subarachnoid hemorrhage. No mass, mass effect, or midline shift. Ventricles and sulci are prominent but stable.  Cerebellum and brainstem are normal. Basal cisterns are patent. No acute cortical ischemia or infarct. Vascular: Calcified atherosclerosis is seen in the intracranial carotid arteries and the basilar artery. Skull: Normal. Negative for fracture or focal lesion. Sinuses/Orbits: No acute finding. Other: No other abnormalities. CT CERVICAL SPINE FINDINGS Alignment: Normal. Skull base and vertebrae: Mild anterior wedging of C5 is favored to be chronic. No fracture or soft tissue swelling identified. Facet degenerative changes are seen in the upper cervical spine. No acute fractures are identified. Soft tissues and spinal canal: No prevertebral fluid or swelling. No visible canal hematoma. Disc levels: Mild degenerative changes including the upper sixth assessed. Upper chest: Negative. Other: No other abnormalities. IMPRESSION: 1. No acute intracranial abnormality. 2. No evidence of acute fracture or traumatic malalignment. Electronically Signed   By: Dorise Bullion III M.D   On: 08/09/2016 16:54     Scheduled Meds: . aspirin EC  81 mg Oral Daily  . atorvastatin  40 mg Oral q1800  . clonazePAM  2 mg Oral BID  . clopidogrel  75 mg Oral Daily  . digoxin  0.0625 mg Oral Daily  . heparin  5,000 Units Subcutaneous Q8H  . potassium chloride  40 mEq Oral Once  . potassium chloride  40 mEq Oral BID  . QUEtiapine  300 mg Oral BID  . QUEtiapine  400 mg Oral QHS  . risperiDONE  2 mg Oral TID  . sodium chloride flush  3 mL Intravenous Q12H   Continuous Infusions:    LOS: 0 days   Time Spent in minutes   30 minutes  Myeshia Fojtik D.O. on 08/10/2016 at 11:44 AM  Between 7am to 7pm - Pager - [phone]  After 7pm go to [email or web address] - password [email or web address]  And look for the night coverage person covering for me after hours  [facility] Office  [phone]

## 2016-08-10 NOTE — Evaluation (Signed)
Physical Therapy Evaluation Patient Details Name: Caroline [name] MRN: [id number] DOB: 1951/10/27 Today's Date: 08/10/2016   History of Present Illness  Patient is a 65 yo female admitted 08/09/16 with syncope and fall x2, and FTT.   PMH:  CAD, MI, PCI, CHF, anxiety, depression, bipolar disorder, CKD, HTN, Afib    Clinical Impression  Patient presents with problems listed below.  Will benefit from acute PT to maximize functional mobility prior to discharge.  Patient was independent pta.  Now with general weakness impacting mobility, along with multiple syncope/fall episodes.  Recommend ST-SNF at discharge for continued therapy for mobility, gait, and balance training.    Follow Up Recommendations SNF;Supervision/Assistance - 24 hour    Equipment Recommendations  None recommended by PT    Recommendations for Other Services       Precautions / Restrictions Precautions Precautions: Fall Precaution Comments: Multiple falls at home, including fall down her stairs Restrictions Weight Bearing Restrictions: No      Mobility  Bed Mobility Overal bed mobility: Needs Assistance Bed Mobility: Supine to Sit;Sit to Supine     Supine to sit: Min guard Sit to supine: Min guard   General bed mobility comments: Assist for safety.  No dizziness reports with transition.  Good balance in sitting.  Patient fatigued quickly.  Returned to supine.  Transfers                 General transfer comment: Patient declined due to active diarrhea.  Ambulation/Gait                Stairs            Wheelchair Mobility    Modified Rankin (Stroke Patients Only)       Balance                                             Pertinent Vitals/Pain Pain Assessment: No/denies pain    Home Living Family/patient expects to be discharged to:: Private residence Living Arrangements: Alone Available Help at Discharge: Family;Friend(s);Available PRN/intermittently Type  of Home: House Home Access: Stairs to enter Entrance Stairs-Rails: Psychiatric nurse of Steps: 6 Home Layout: Two level;1/2 bath on main level;Bed/bath upstairs Home Equipment: Walker - 2 wheels;Cane - single point;Bedside commode;Shower seat;Wheelchair - manual      Prior Function Level of Independence: Independent;Needs assistance   Gait / Transfers Assistance Needed: independent with walking  ADL's / Homemaking Assistance Needed: assist with cleaning; friend does grocery shopping        Hand Dominance   Dominant Hand:  (Patient reports using both hands)    Extremity/Trunk Assessment   Upper Extremity Assessment: Generalized weakness           Lower Extremity Assessment: Generalized weakness         Communication   Communication: No difficulties  Cognition Arousal/Alertness: Awake/alert Behavior During Therapy: Flat affect;Anxious Overall Cognitive Status: Within Functional Limits for tasks assessed                      General Comments      Exercises     Assessment/Plan    PT Assessment Patient needs continued PT services  PT Problem List Decreased strength;Decreased activity tolerance;Decreased balance;Decreased mobility;Decreased knowledge of use of DME          PT Treatment Interventions DME instruction;Gait training;Functional mobility  training;Therapeutic activities;Therapeutic exercise;Balance training;Patient/family education    PT Goals (Current goals can be found in the Care Plan section)  Acute Rehab PT Goals Patient Stated Goal: Patient focused on diarrhea. None stated PT Goal Formulation: With patient Time For Goal Achievement: 08/24/16 Potential to Achieve Goals: Good    Frequency Min 3X/week   Barriers to discharge Inaccessible home environment;Decreased caregiver support Patient's home with 6 stairs to enter, and bedroom upstairs.  Patient lives alone with prn assist only.    Co-evaluation                End of Session   Activity Tolerance: Patient limited by fatigue Patient left: in bed;with call bell/phone within reach;with bed alarm set Nurse Communication: Mobility status    Functional Assessment Tool Used: Clinical judgement Functional Limitation: Mobility: Walking and moving around Mobility: Walking and Moving Around Current Status [id number]): At least 40 percent but less than 60 percent impaired, limited or restricted Mobility: Walking and Moving Around Goal Status [phone]): At least 1 percent but less than 20 percent impaired, limited or restricted    Time: [id number] PT Time Calculation (min) (ACUTE ONLY): 12 min   Charges:   PT Evaluation $PT Eval Moderate Complexity: 1 Procedure     PT G Codes:   PT G-Codes **NOT FOR INPATIENT CLASS** Functional Assessment Tool Used: Clinical judgement Functional Limitation: Mobility: Walking and moving around Mobility: Walking and Moving Around Current Status [id number]): At least 40 percent but less than 60 percent impaired, limited or restricted Mobility: Walking and Moving Around Goal Status [phone]): At least 1 percent but less than 20 percent impaired, limited or restricted    Despina Pole 08/10/2016, 6:54 PM Carita Pian. Sanjuana Kava, [employer] Pager [phone]

## 2016-08-10 NOTE — Consult Note (Signed)
[facility] Consult   Reason for Consult:  Severe depression Referring Physician:  Dr. Sloan Leiter Patient Identification: Caroline [name] MRN:  [id number] Principal Diagnosis: Bipolar 1 disorder, depressed, partial remission ([place]) Diagnosis:   Patient Active Problem List   Diagnosis Date Noted  . Bipolar 1 disorder, depressed, partial remission ([place]) [F31.75] 08/10/2016  . Syncope [R55] 08/09/2016  . Acute kidney injury superimposed on chronic kidney disease ([place]) [N17.9, N18.9] 08/09/2016  . Coronary artery disease involving native coronary artery [I25.10]   . CAD S/P percutaneous coronary angioplasty [I25.10, Z98.61]   . Acute on chronic systolic (congestive) heart failure [I50.23] 07/02/2016  . 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 Chronic renal disease, stage 3, moderately decreased glomerular filtration rate (GFR) between 30-59 mL/min/1.73 square meter [N18.3] 06/19/2015  . Adrenal insufficiency ([place]) [E27.40]   . Orthostasis [I95.1] 06/11/2015  . Pubic ramus fracture ([place]) [S32.599A] 06/09/2015  . Fracture of multiple pubic rami ([place]) [S32.599A] 06/09/2015  . Chronic diarrhea [K52.9] 03/21/2015  . Cardiomyopathy, ischemic [I25.5] 01/23/2015  . 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 Chronic low back pain [M54.5, G89.29] 05/21/2014  . Dizziness and giddiness [R42] 05/03/2014  . Difficulty speaking [R47.9] 05/03/2014  . Numbness-Bilateral arm / Leg [R20.0] 05/03/2014  . Aftercare following surgery of the circulatory system, [place] [Z48.812] 05/03/2014  . Carotid artery occlusion with infarction ([place]) [I63.239] 04/26/2013  . Weight loss [R63.4] 04/23/2012  . Hot flashes [R23.2] 04/23/2012  . Anemia [D64.9] 04/23/2012  . Breast cancer ([place]) [C50.919] 04/23/2012  . Occlusion and stenosis of carotid artery without mention of cerebral infarction [I65.29] 01/06/2012  . Bruit [R09.89] 12/09/2011  . SMOKER [F17.200] 05/24/2009  . ANXIETY DEPRESSION [F34.1]  02/28/2009  . GRAVES DISEASE [E05.00] 02/17/2009  . Hyperlipidemia [E78.5] 02/17/2009  . Coronary atherosclerosis [I25.10] 02/17/2009  . GERD [K21.9] 02/17/2009    Total Time spent with patient: 1 hour  Subjective:   Caroline [name] is a 65 y.o. female patient admitted with syncope.  HPI:  Thanks for asking me to do a psychiatric consult on [name], a 65 y.o. female with history of Bipolar depression, CAD status post recent PCI in September, chronic systolic heart failure, depression, stage III chronic kidney disease, long-standing history of hypertension with orthostatic changes who presented to the ED for evaluation after a fall at her home. With patient's permission, she was interviewed in the presence of her family-cousin, 2 sisters and daughter. Patient reports that she fell in her apartment following and episode of dizziness. She also reports that she lives alone with her bird and often times feels lonely. She reports feeling depressed from time to time due to lack of companionship but quickly added that her family has been very helpful. Patient denies psychosis, delusions or suicidal thoughts. She has been seeing her psychiatrist regularly at [facility] in [place]. Patient denies drugs and alcohol abuse.  Past Psychiatric History: as above  Risk to Self: Is patient at risk for suicide?: No Risk to Others:   Prior Inpatient Therapy:   Prior Outpatient Therapy:    Past Medical History:  Past Medical History:  Diagnosis Date  . Angina   . Anxiety   . Arthritis    "severe in my back" (07/02/2016)  . Atrial fibrillation ([place])   . Bipolar disorder ([place])   . Bright disease   . CAD (coronary artery disease)    [facility]  Diagonal instent restenosis treated 10/2005  . Cancer of right breast ([place]) 2002  .  Carotid artery occlusion   . CHF (congestive heart failure) ([place])    states she was told se had this in past by MD  . Chronic back pain   . Depression    04-Jan-2002 had  shock treatment due to depresson, spouse passed away Jan 05, 2000  . Dyslipidemia   . GERD (gastroesophageal reflux disease)   . Graves' disease    /notes 06/03/2010  . Heart murmur    states years ago  . Hypertension    states a long time ago-states she never took medication  . Non Q wave myocardial infarction ([place]) 03/2004   Archie Endo 06/03/2010  . Shortness of breath    with exertion    Past Surgical History:  Procedure Laterality Date  . BREAST LUMPECTOMY Right 04-Jan-2001  . CARDIAC CATHETERIZATION N/A 07/03/2016   Procedure: Right/Left Heart Cath and Coronary Angiography;  Surgeon: Leonie Man, MD;  Location: [facility];  Service: Cardiovascular;  Laterality: N/A;  . CARDIAC CATHETERIZATION N/A 07/22/2016   Procedure: Coronary Stent Intervention;  Surgeon: Leonie Man, MD;  Location: [facility];  Service: Cardiovascular;  Laterality: N/A;  . CAROTID ENDARTERECTOMY  01/08/12   Left  . CORONARY ANGIOPLASTY  03/2009   Archie Endo 06/03/2010  . CORONARY ANGIOPLASTY WITH STENT PLACEMENT  03/2004   Archie Endo 06/03/2010;  . CORONARY ANGIOPLASTY WITH STENT PLACEMENT      "I've got 3 stent in there" (07/02/2016)  . CORONARY STENT PLACEMENT  07/23/2016   STENT PROMUS PREM MR 2.5X12 drug eluting stent was successfully placed, and overlaps previously placed stent.  [name] ENDARTERECTOMY  01/08/2012   Procedure: ENDARTERECTOMY CAROTID;  Surgeon: Mal Misty, MD;  Location: [facility];  Service: Vascular;  Laterality: Left;  with Dacron patch angioplasty  . FRACTURE SURGERY  January 04, 2006   Broken Back  . JOINT REPLACEMENT    . MULTIPLE TOOTH EXTRACTIONS  01/04/2015   "18 or 19"  . TEE WITHOUT CARDIOVERSION N/A 07/04/2016   Procedure: TRANSESOPHAGEAL ECHOCARDIOGRAM (TEE);  Surgeon: Larey Dresser, MD;  Location: [facility];  Service: Cardiovascular;  Laterality: N/A;  . TOTAL HIP ARTHROPLASTY Left Jan 04, 2010  . VAGINAL HYSTERECTOMY  1970s   partial   Family History:  Family History  Problem Relation Age of Onset  .  Coronary artery disease Father   . Prostate cancer Father     not sure  . Pancreatic cancer Father     not sure  . Cancer Father     Pancreatic and  Prostate  . Heart disease Father     Before age 68 and BPG  . Hyperlipidemia Father   . Heart attack Father   . Hypertension Mother   . Diabetes Sister   . Colon cancer Neg Hx    Family Psychiatric  History:  Social History:  History  Alcohol Use No     History  Drug Use No    Social History   Social History  . Marital status: Widowed    Spouse name: N/A  . Number of children: N/A  . Years of education: N/A   Social History Main Topics  . Smoking status: Current Some Day Smoker    Packs/day: 0.75    Years: 49.00    Types: Cigarettes    Last attempt to quit: 08/04/2015  . Smokeless tobacco: Never Used  . Alcohol use No  . Drug use: No  . Sexual activity: No   Other Topics Concern  . None   Social History Narrative  She has 1 child.  She is on disability.   Additional Social History:    Allergies:  No Known Allergies  Labs:  Results for orders placed or performed during the hospital encounter of 08/09/16 (from the past 48 hour(s))  Comprehensive metabolic panel     Status: Abnormal   Collection Time: 08/09/16  2:13 PM  Result Value Ref Range   Sodium 137 135 - 145 mmol/L   Potassium 3.1 (L) 3.5 - 5.1 mmol/L   Chloride 101 101 - 111 mmol/L   CO2 25 22 - 32 mmol/L   Glucose, Bld 115 (H) 65 - 99 mg/dL   BUN 35 (H) 6 - 20 mg/dL   Creatinine, Ser 2.95 (H) 0.44 - 1.00 mg/dL   Calcium 9.0 8.9 - 10.3 mg/dL   Total Protein 6.5 6.5 - 8.1 g/dL   Albumin 3.5 3.5 - 5.0 g/dL   AST 23 15 - 41 U/L   ALT 16 14 - 54 U/L   Alkaline Phosphatase 73 38 - 126 U/L   Total Bilirubin 0.6 0.3 - 1.2 mg/dL   GFR calc non Af Amer 16 (L) >60 mL/min   GFR calc Af Amer 18 (L) >60 mL/min    Comment: (NOTE) The eGFR has been calculated using the CKD EPI equation. This calculation has not been validated in all clinical  situations. eGFR's persistently <60 mL/min signify possible Chronic Kidney Disease.    Anion gap 11 5 - 15  CBC with Differential     Status: Abnormal   Collection Time: 08/09/16  2:13 PM  Result Value Ref Range   WBC 6.8 4.0 - 10.5 K/uL   RBC 3.68 (L) 3.87 - 5.11 MIL/uL   Hemoglobin 10.5 (L) 12.0 - 15.0 g/dL   HCT 32.5 (L) 36.0 - 46.0 %   MCV 88.3 78.0 - 100.0 fL   MCH 28.5 26.0 - 34.0 pg   MCHC 32.3 30.0 - 36.0 g/dL   RDW 14.5 11.5 - 15.5 %   Platelets 269 150 - 400 K/uL   Neutrophils Relative % 68 %   Neutro Abs 4.6 1.7 - 7.7 K/uL   Lymphocytes Relative 18 %   Lymphs Abs 1.2 0.7 - 4.0 K/uL   Monocytes Relative 10 %   Monocytes Absolute 0.7 0.1 - 1.0 K/uL   Eosinophils Relative 3 %   Eosinophils Absolute 0.2 0.0 - 0.7 K/uL   Basophils Relative 1 %   Basophils Absolute 0.1 0.0 - 0.1 K/uL  I-stat troponin, ED     Status: Abnormal   Collection Time: 08/09/16  3:03 PM  Result Value Ref Range   Troponin i, poc 0.13 (HH) 0.00 - 0.08 ng/mL   Comment 3            Comment: Due to the release kinetics of cTnI, a negative result within the first hours of the onset of symptoms does not rule out myocardial infarction with certainty. If myocardial infarction is still suspected, repeat the test at appropriate intervals.   Troponin I (q 6hr x 3)     Status: Abnormal   Collection Time: 08/09/16  7:14 PM  Result Value Ref Range   Troponin I 0.10 (HH) <0.03 ng/mL    Comment: CRITICAL RESULT CALLED TO, READ BACK BY AND VERIFIED WITH: MOOREARN 2015 [id number] MCCAULEG   Digoxin level     Status: Abnormal   Collection Time: 08/09/16  7:14 PM  Result Value Ref Range   Digoxin Level 0.7 (L) 0.8 - 2.0 ng/mL  Troponin I (q 6hr x 3)     Status: Abnormal   Collection Time: 08/10/16  2:03 AM  Result Value Ref Range   Troponin I 0.09 (HH) <0.03 ng/mL    Comment: CRITICAL VALUE NOTED.  VALUE IS CONSISTENT WITH PREVIOUSLY REPORTED AND CALLED VALUE.  CBC     Status: Abnormal   Collection Time:  08/10/16  6:24 AM  Result Value Ref Range   WBC 6.0 4.0 - 10.5 K/uL   RBC 3.29 (L) 3.87 - 5.11 MIL/uL   Hemoglobin 9.2 (L) 12.0 - 15.0 g/dL   HCT 29.2 (L) 36.0 - 46.0 %   MCV 88.8 78.0 - 100.0 fL   MCH 28.0 26.0 - 34.0 pg   MCHC 31.5 30.0 - 36.0 g/dL   RDW 14.5 11.5 - 15.5 %   Platelets 249 150 - 400 K/uL  Basic metabolic panel     Status: Abnormal   Collection Time: 08/10/16  6:24 AM  Result Value Ref Range   Sodium 140 135 - 145 mmol/L   Potassium 2.9 (L) 3.5 - 5.1 mmol/L   Chloride 102 101 - 111 mmol/L   CO2 27 22 - 32 mmol/L   Glucose, Bld 88 65 - 99 mg/dL   BUN 26 (H) 6 - 20 mg/dL   Creatinine, Ser 2.12 (H) 0.44 - 1.00 mg/dL   Calcium 8.9 8.9 - 10.3 mg/dL   GFR calc non Af Amer 24 (L) >60 mL/min   GFR calc Af Amer 27 (L) >60 mL/min    Comment: (NOTE) The eGFR has been calculated using the CKD EPI equation. This calculation has not been validated in all clinical situations. eGFR's persistently <60 mL/min signify possible Chronic Kidney Disease.    Anion gap 11 5 - 15  Troponin I (q 6hr x 3)     Status: Abnormal   Collection Time: 08/10/16  6:24 AM  Result Value Ref Range   Troponin I 0.09 (HH) <0.03 ng/mL    Comment: CRITICAL VALUE NOTED.  VALUE IS CONSISTENT WITH PREVIOUSLY REPORTED AND CALLED VALUE.  Glucose, capillary     Status: None   Collection Time: 08/10/16  7:31 AM  Result Value Ref Range   Glucose-Capillary 88 65 - 99 mg/dL  Digoxin level     Status: Abnormal   Collection Time: 08/10/16  9:44 AM  Result Value Ref Range   Digoxin Level 0.7 (L) 0.8 - 2.0 ng/mL  Magnesium     Status: None   Collection Time: 08/10/16  9:44 AM  Result Value Ref Range   Magnesium 1.9 1.7 - 2.4 mg/dL  Glucose, capillary     Status: Abnormal   Collection Time: 08/10/16 11:16 AM  Result Value Ref Range   Glucose-Capillary 168 (H) 65 - 99 mg/dL   Comment 1 Notify RN     Current Facility-Administered Medications  Medication Dose Route Frequency Provider Last Rate Last Dose   . acetaminophen (TYLENOL) tablet 650 mg  650 mg Oral Q6H PRN Shanker Kristeen Mans, MD       Or  . acetaminophen (TYLENOL) suppository 650 mg  650 mg Rectal Q6H PRN Jonetta Osgood, MD      . aspirin EC tablet 81 mg  81 mg Oral Daily Jonetta Osgood, MD   81 mg at 08/10/16 0934  . atorvastatin (LIPITOR) tablet 40 mg  40 mg Oral q1800 Jonetta Osgood, MD   40 mg at 08/09/16 1852  . clonazePAM (KLONOPIN) tablet 1 mg  1 mg  Oral BID Corena Pilgrim, MD      . clopidogrel (PLAVIX) tablet 75 mg  75 mg Oral Daily Jonetta Osgood, MD   75 mg at 08/10/16 0934  . digoxin (LANOXIN) tablet 0.0625 mg  0.0625 mg Oral Daily Fay Records, MD   0.0625 mg at 08/10/16 1106  . [START ON 08/11/2016] FLUoxetine (PROZAC) capsule 10 mg  10 mg Oral Daily Lennon Boutwell, MD      . heparin injection 5,000 Units  5,000 Units Subcutaneous Q8H Jonetta Osgood, MD   5,000 Units at 08/10/16 0547  . nitroGLYCERIN (NITROSTAT) SL tablet 0.4 mg  0.4 mg Sublingual Q5 min PRN Jonetta Osgood, MD      . potassium chloride SA (K-DUR,KLOR-CON) CR tablet 40 mEq  40 mEq Oral Once Shawn C Joy, PA-C      . potassium chloride SA (K-DUR,KLOR-CON) CR tablet 40 mEq  40 mEq Oral BID Maryann Mikhail, DO   40 mEq at 08/10/16 1105  . QUEtiapine (SEROQUEL) tablet 400 mg  400 mg Oral QHS Jonetta Osgood, MD   400 mg at 08/09/16 2127  . sodium chloride flush (NS) 0.9 % injection 3 mL  3 mL Intravenous Q12H Jonetta Osgood, MD   3 mL at 08/10/16 1000    Musculoskeletal: Strength & Muscle Tone: within normal limits Gait & Station: not tested Patient leans: N/A  Psychiatric Specialty Exam: Physical Exam  Psychiatric: Her speech is normal. Judgment and thought content normal. Her mood appears anxious. She is slowed and withdrawn. Cognition and memory are normal. She exhibits a depressed mood.    Review of Systems  Constitutional: Positive for malaise/fatigue.  HENT: Negative.   Eyes: Negative.   Cardiovascular: Negative.    Gastrointestinal: Negative.   Skin: Negative.   Neurological: Positive for dizziness.  Endo/Heme/Allergies: Negative.   Psychiatric/Behavioral: Positive for depression. The patient is nervous/anxious.     Blood pressure (!) 101/39, pulse 77, temperature 98.5 F (36.9 C), temperature source Oral, resp. rate 20, height _0  (1.6 m), weight 52.9 kg (116 lb 9.6 oz), SpO2 97 %.Body mass index is 20.65 kg/m.  General Appearance: Casual  Eye Contact:  Fair  Speech:  Clear and Coherent  Volume:  Decreased  Mood:  Depressed  Affect:  Tearful  Thought Process:  Coherent and Descriptions of Associations: Intact  Orientation:  Full (Time, Place, and Person)  Thought Content:  Logical  Suicidal Thoughts:  No  Homicidal Thoughts:  No  Memory:  Immediate;   Fair Recent;   Fair Remote;   Good  Judgement:  Intact  Insight:  Fair  Psychomotor Activity:  Psychomotor Retardation  Concentration:  Concentration: Fair and Attention Span: Fair  Recall:  Good  Fund of Knowledge:  Good  Language:  Good  Akathisia:  No  Handed:  Right  AIMS (if indicated):     Assets:  Communication Skills Desire for Improvement Social Support  ADL's:  Intact  Cognition:  WNL  Sleep:        Treatment Plan Summary: Plan/Recommendation -Patient is cleared by psychiatric service due to lack of imminent danger to self and others -Decrease Seroquel to 400 mg at bedtime due to syncope -Decrease Clonazepam to 24m bid due to syncope. -Discontinue Risperdal to avoid extrapyramidal side effects, patient is already on Seroquel. - Consider Home health aide to assist patient with ADL and other activities.  Disposition: No evidence of imminent risk to self or others at present.   Patient does  not meet criteria for psychiatric inpatient admission. Supportive therapy provided about ongoing stressors. Unit social worker to help with psych outpatient appointment upon discharge  Corena Pilgrim, MD 08/10/2016 2:30 PM

## 2016-08-10 NOTE — Progress Notes (Addendum)
Subjective: No CP  No SOB  Not out of bed since been herre   Objective: Vitals:   08/09/16 2149 08/09/16 2356 08/10/16 0521 08/10/16 0804  BP: 140/87 (!) 109/50 91/75 (!) 112/50  Pulse: 74 72 (!) 51 66  Resp: 18 18 16 18   Temp: 97.5 F (36.4 C) 97.6 F (36.4 C) 97.7 F (36.5 C) 98 F (36.7 C)  TempSrc: Oral Oral Oral Oral  SpO2: 100% 100% 95%   Weight:   116 lb 9.6 oz (52.9 kg)   Height:       Weight change:   Intake/Output Summary (Last 24 hours) at 08/10/16 0859 Last data filed at 08/10/16 0303  Gross per 24 hour  Intake           973.34 ml  Output              500 ml  Net           473.34 ml    General: Alert, awake, oriented x3, in no acute distress Neck:  JVP is normal Heart: Regular rate and rhythm, without murmurs, rubs, gallops.  Lungs: Clear to auscultation.  No rales or wheezes. Exemities:  No edema.   Neuro: Grossly intact, nonfocal.  Tele:  SR   Lab Results: Results for orders placed or performed during the hospital encounter of 08/09/16 (from the past 24 hour(s))  Comprehensive metabolic panel     Status: Abnormal   Collection Time: 08/09/16  2:13 PM  Result Value Ref Range   Sodium 137 135 - 145 mmol/L   Potassium 3.1 (L) 3.5 - 5.1 mmol/L   Chloride 101 101 - 111 mmol/L   CO2 25 22 - 32 mmol/L   Glucose, Bld 115 (H) 65 - 99 mg/dL   BUN 35 (H) 6 - 20 mg/dL   Creatinine, Ser 2.95 (H) 0.44 - 1.00 mg/dL   Calcium 9.0 8.9 - 10.3 mg/dL   Total Protein 6.5 6.5 - 8.1 g/dL   Albumin 3.5 3.5 - 5.0 g/dL   AST 23 15 - 41 U/L   ALT 16 14 - 54 U/L   Alkaline Phosphatase 73 38 - 126 U/L   Total Bilirubin 0.6 0.3 - 1.2 mg/dL   GFR calc non Af Amer 16 (L) >60 mL/min   GFR calc Af Amer 18 (L) >60 mL/min   Anion gap 11 5 - 15  CBC with Differential     Status: Abnormal   Collection Time: 08/09/16  2:13 PM  Result Value Ref Range   WBC 6.8 4.0 - 10.5 K/uL   RBC 3.68 (L) 3.87 - 5.11 MIL/uL   Hemoglobin 10.5 (L) 12.0 - 15.0 g/dL   HCT 32.5 (L) 36.0 - 46.0 %    MCV 88.3 78.0 - 100.0 fL   MCH 28.5 26.0 - 34.0 pg   MCHC 32.3 30.0 - 36.0 g/dL   RDW 14.5 11.5 - 15.5 %   Platelets 269 150 - 400 K/uL   Neutrophils Relative % 68 %   Neutro Abs 4.6 1.7 - 7.7 K/uL   Lymphocytes Relative 18 %   Lymphs Abs 1.2 0.7 - 4.0 K/uL   Monocytes Relative 10 %   Monocytes Absolute 0.7 0.1 - 1.0 K/uL   Eosinophils Relative 3 %   Eosinophils Absolute 0.2 0.0 - 0.7 K/uL   Basophils Relative 1 %   Basophils Absolute 0.1 0.0 - 0.1 K/uL  I-stat troponin, ED     Status: Abnormal   Collection Time:  08/09/16  3:03 PM  Result Value Ref Range   Troponin i, poc 0.13 (HH) 0.00 - 0.08 ng/mL   Comment 3          Troponin I (q 6hr x 3)     Status: Abnormal   Collection Time: 08/09/16  7:14 PM  Result Value Ref Range   Troponin I 0.10 (HH) <0.03 ng/mL  Digoxin level     Status: Abnormal   Collection Time: 08/09/16  7:14 PM  Result Value Ref Range   Digoxin Level 0.7 (L) 0.8 - 2.0 ng/mL  Troponin I (q 6hr x 3)     Status: Abnormal   Collection Time: 08/10/16  2:03 AM  Result Value Ref Range   Troponin I 0.09 (HH) <0.03 ng/mL  CBC     Status: Abnormal   Collection Time: 08/10/16  6:24 AM  Result Value Ref Range   WBC 6.0 4.0 - 10.5 K/uL   RBC 3.29 (L) 3.87 - 5.11 MIL/uL   Hemoglobin 9.2 (L) 12.0 - 15.0 g/dL   HCT 29.2 (L) 36.0 - 46.0 %   MCV 88.8 78.0 - 100.0 fL   MCH 28.0 26.0 - 34.0 pg   MCHC 31.5 30.0 - 36.0 g/dL   RDW 14.5 11.5 - 15.5 %   Platelets 249 150 - 400 K/uL  Basic metabolic panel     Status: Abnormal   Collection Time: 08/10/16  6:24 AM  Result Value Ref Range   Sodium 140 135 - 145 mmol/L   Potassium 2.9 (L) 3.5 - 5.1 mmol/L   Chloride 102 101 - 111 mmol/L   CO2 27 22 - 32 mmol/L   Glucose, Bld 88 65 - 99 mg/dL   BUN 26 (H) 6 - 20 mg/dL   Creatinine, Ser 2.12 (H) 0.44 - 1.00 mg/dL   Calcium 8.9 8.9 - 10.3 mg/dL   GFR calc non Af Amer 24 (L) >60 mL/min   GFR calc Af Amer 27 (L) >60 mL/min   Anion gap 11 5 - 15  Troponin I (q 6hr x 3)      Status: Abnormal   Collection Time: 08/10/16  6:24 AM  Result Value Ref Range   Troponin I 0.09 (HH) <0.03 ng/mL  Glucose, capillary     Status: None   Collection Time: 08/10/16  7:31 AM  Result Value Ref Range   Glucose-Capillary 88 65 - 99 mg/dL    Studies/Results: Dg Chest 2 View  Result Date: 08/09/2016 CLINICAL DATA:  Dizziness and fall.  Ex smoker. EXAM: CHEST  2 VIEW COMPARISON:  06/12/2015. FINDINGS: Heart size upper limits normal. Prominent coronary artery calcification with stents. No active infiltrates or failure no mediastinal masses, effusions, or pneumothorax. Previous RIGHT breast biopsy. Osteopenia. No compression fracture. Similar appearance to priors. IMPRESSION: No active cardiopulmonary disease. Electronically Signed   By: Staci Righter M.D.   On: 08/09/2016 15:53   Ct Head Wo Contrast  Result Date: 08/09/2016 CLINICAL DATA:  Increase falls and dizziness.  Hypotension. EXAM: CT HEAD WITHOUT CONTRAST CT CERVICAL SPINE WITHOUT CONTRAST TECHNIQUE: Multidetector CT imaging of the head and cervical spine was performed following the standard protocol without intravenous contrast. Multiplanar CT image reconstructions of the cervical spine were also generated. COMPARISON:  June 09, 2015 FINDINGS: CT HEAD FINDINGS Brain: No subdural, epidural, or subarachnoid hemorrhage. No mass, mass effect, or midline shift. Ventricles and sulci are prominent but stable. Cerebellum and brainstem are normal. Basal cisterns are patent. No acute cortical ischemia or  infarct. Vascular: Calcified atherosclerosis is seen in the intracranial carotid arteries and the basilar artery. Skull: Normal. Negative for fracture or focal lesion. Sinuses/Orbits: No acute finding. Other: No other abnormalities. CT CERVICAL SPINE FINDINGS Alignment: Normal. Skull base and vertebrae: Mild anterior wedging of C5 is favored to be chronic. No fracture or soft tissue swelling identified. Facet degenerative changes are seen in  the upper cervical spine. No acute fractures are identified. Soft tissues and spinal canal: No prevertebral fluid or swelling. No visible canal hematoma. Disc levels: Mild degenerative changes including the upper sixth assessed. Upper chest: Negative. Other: No other abnormalities. IMPRESSION: 1. No acute intracranial abnormality. 2. No evidence of acute fracture or traumatic malalignment. Electronically Signed   By: Dorise Bullion III M.D   On: 08/09/2016 16:54   Ct Cervical Spine Wo Contrast  Result Date: 08/09/2016 CLINICAL DATA:  Increase falls and dizziness.  Hypotension. EXAM: CT HEAD WITHOUT CONTRAST CT CERVICAL SPINE WITHOUT CONTRAST TECHNIQUE: Multidetector CT imaging of the head and cervical spine was performed following the standard protocol without intravenous contrast. Multiplanar CT image reconstructions of the cervical spine were also generated. COMPARISON:  June 09, 2015 FINDINGS: CT HEAD FINDINGS Brain: No subdural, epidural, or subarachnoid hemorrhage. No mass, mass effect, or midline shift. Ventricles and sulci are prominent but stable. Cerebellum and brainstem are normal. Basal cisterns are patent. No acute cortical ischemia or infarct. Vascular: Calcified atherosclerosis is seen in the intracranial carotid arteries and the basilar artery. Skull: Normal. Negative for fracture or focal lesion. Sinuses/Orbits: No acute finding. Other: No other abnormalities. CT CERVICAL SPINE FINDINGS Alignment: Normal. Skull base and vertebrae: Mild anterior wedging of C5 is favored to be chronic. No fracture or soft tissue swelling identified. Facet degenerative changes are seen in the upper cervical spine. No acute fractures are identified. Soft tissues and spinal canal: No prevertebral fluid or swelling. No visible canal hematoma. Disc levels: Mild degenerative changes including the upper sixth assessed. Upper chest: Negative. Other: No other abnormalities. IMPRESSION: 1. No acute intracranial  abnormality. 2. No evidence of acute fracture or traumatic malalignment. Electronically Signed   By: Dorise Bullion III M.D   On: 08/09/2016 16:54    Medications:Reviewed    @PROBHOSP @  1  Recurernt dizzines and falls   Pt received IVfluids  Not up yet  I would recomm stopping and Check BP andsymptoms with moving  PO intake has been poor Get up and follow BP    2  Chornic systolic CHF  Volume status is OK  I would hold aldactone for now  I also wonder what Dig is doing  I am not convinced helping and with renal function changing concerned about risk.  WIll keep for now since level low   3  CAD  No evid for active ischemia    4  CKD    Cr is improved at 2.12  I would pul back on IV fluids    5  Nutrition  Would ask dietary to see  Optimize calorie intake    As mentioned by Dr Meda Coffee, consider palliative care consult  Concern for depression    LOS: 0 days   Dorris Carnes 08/10/2016, 8:59 AM

## 2016-08-11 DIAGNOSIS — I951 Orthostatic hypotension: Secondary | ICD-10-CM | POA: Diagnosis not present

## 2016-08-11 DIAGNOSIS — E440 Moderate protein-calorie malnutrition: Secondary | ICD-10-CM | POA: Insufficient documentation

## 2016-08-11 DIAGNOSIS — F3175 Bipolar disorder, in partial remission, most recent episode depressed: Secondary | ICD-10-CM

## 2016-08-11 DIAGNOSIS — N189 Chronic kidney disease, unspecified: Secondary | ICD-10-CM | POA: Diagnosis not present

## 2016-08-11 DIAGNOSIS — I255 Ischemic cardiomyopathy: Secondary | ICD-10-CM | POA: Diagnosis not present

## 2016-08-11 DIAGNOSIS — N179 Acute kidney failure, unspecified: Secondary | ICD-10-CM | POA: Diagnosis not present

## 2016-08-11 DIAGNOSIS — I2510 Atherosclerotic heart disease of native coronary artery without angina pectoris: Secondary | ICD-10-CM | POA: Diagnosis not present

## 2016-08-11 DIAGNOSIS — I5022 Chronic systolic (congestive) heart failure: Secondary | ICD-10-CM | POA: Diagnosis not present

## 2016-08-11 LAB — BASIC METABOLIC PANEL
Anion gap: 12 (ref 5–15)
BUN: 21 mg/dL — AB (ref 6–20)
CALCIUM: 9 mg/dL (ref 8.9–10.3)
CHLORIDE: 104 mmol/L (ref 101–111)
CO2: 22 mmol/L (ref 22–32)
CREATININE: 1.78 mg/dL — AB (ref 0.44–1.00)
GFR calc non Af Amer: 29 mL/min — ABNORMAL LOW (ref >60)
GFR, EST AFRICAN AMERICAN: 34 mL/min — AB (ref >60)
Glucose, Bld: 98 mg/dL (ref 65–99)
Potassium: 4 mmol/L (ref 3.5–5.1)
SODIUM: 138 mmol/L (ref 135–145)

## 2016-08-11 LAB — GLUCOSE, CAPILLARY: GLUCOSE-CAPILLARY: 99 mg/dL (ref 65–99)

## 2016-08-11 MED ORDER — ADULT MULTIVITAMIN W/MINERALS CH
1.0000 | ORAL_TABLET | Freq: Every day | ORAL | Status: DC
  Administered 2016-08-11 – 2016-08-13 (×3): 1 via ORAL
  Filled 2016-08-11 (×3): qty 1

## 2016-08-11 MED ORDER — ENSURE ENLIVE PO LIQD
237.0000 mL | Freq: Two times a day (BID) | ORAL | Status: DC
  Administered 2016-08-11 – 2016-08-13 (×4): 237 mL via ORAL

## 2016-08-11 NOTE — Progress Notes (Signed)
Patient is refusing new medication Prozac. States she wants to remain on Risperidone. MD aware of patients refusal.  Hermina Barters Rn 08/11/2016 12:43 PM

## 2016-08-11 NOTE — Progress Notes (Signed)
Advanced Heart Failure Rounding Note   Subjective:   Admitted with dizziness and orthostatic hypotension. Received IV fluids.   Denies SOB/dizziness/CP.    ECHO 06/25/2016 EF 25%   Objective:   Weight Range:  Vital Signs:   Temp:  [97.5 F (36.4 C)-98.5 F (36.9 C)] 97.5 F (36.4 C) (10/09 0344) Pulse Rate:  [77-78] 78 (10/09 0344) Resp:  [20] 20 (10/09 0344) BP: (98-114)/(39-54) 114/54 (10/09 0344) SpO2:  [96 %-98 %] 97 % (10/09 0344) Weight:  [117 lb 6.4 oz (53.3 kg)] 117 lb 6.4 oz (53.3 kg) (10/09 0344) Last BM Date: 08/10/16  Weight change: Filed Weights   08/09/16 1837 08/10/16 0521 08/11/16 0344  Weight: 115 lb 8.3 oz (52.4 kg) 116 lb 9.6 oz (52.9 kg) 117 lb 6.4 oz (53.3 kg)    Intake/Output:   Intake/Output Summary (Last 24 hours) at 08/11/16 0914 Last data filed at 08/11/16 0800  Gross per 24 hour  Intake              600 ml  Output             1275 ml  Net             -675 ml     Physical Exam: General:  Elderly. Frail. No resp difficulty. In bed.  HEENT: normal Nose ecchymotic  Neck: supple. JVP 5-6 . Carotids 2+ bilat; no bruits. No lymphadenopathy or thryomegaly appreciated. Cor: PMI nondisplaced. Regular rate & rhythm. No rubs, gallops or murmurs. Lungs: clear Abdomen: soft, nontender, nondistended. No hepatosplenomegaly. No bruits or masses. Good bowel sounds. Extremities: no cyanosis, clubbing, rash, edema Neuro: alert & orientedx3, cranial nerves grossly intact. moves all 4 extremities w/o difficulty. Affect pleasant  Telemetry: Sinus Rhythm 60s   Labs: Basic Metabolic Panel:  Recent Labs Lab 08/09/16 1413 08/10/16 0624 08/10/16 0944 08/11/16 0420  NA 137 140  --  138  K 3.1* 2.9*  --  4.0  CL 101 102  --  104  CO2 25 27  --  22  GLUCOSE 115* 88  --  98  BUN 35* 26*  --  21*  CREATININE 2.95* 2.12*  --  1.78*  CALCIUM 9.0 8.9  --  9.0  MG  --   --  1.9  --     Liver Function Tests:  Recent Labs Lab 08/09/16 1413  AST  23  ALT 16  ALKPHOS 73  BILITOT 0.6  PROT 6.5  ALBUMIN 3.5   No results for input(s): LIPASE, AMYLASE in the last 168 hours. No results for input(s): AMMONIA in the last 168 hours.  CBC:  Recent Labs Lab 08/09/16 1413 08/10/16 0624  WBC 6.8 6.0  NEUTROABS 4.6  --   HGB 10.5* 9.2*  HCT 32.5* 29.2*  MCV 88.3 88.8  PLT 269 249    Cardiac Enzymes:  Recent Labs Lab 08/09/16 1914 08/10/16 0203 08/10/16 0624  TROPONINI 0.10* 0.09* 0.09*    BNP: BNP (last 3 results)  Recent Labs  06/18/16 1425  BNP 275.8*    ProBNP (last 3 results) No results for input(s): PROBNP in the last 8760 hours.    Other results:  Imaging: Dg Chest 2 View  Result Date: 08/09/2016 CLINICAL DATA:  Dizziness and fall.  Ex smoker. EXAM: CHEST  2 VIEW COMPARISON:  06/12/2015. FINDINGS: Heart size upper limits normal. Prominent coronary artery calcification with stents. No active infiltrates or failure no mediastinal masses, effusions, or pneumothorax. Previous RIGHT breast biopsy.  Osteopenia. No compression fracture. Similar appearance to priors. IMPRESSION: No active cardiopulmonary disease. Electronically Signed   By: Staci Righter M.D.   On: 08/09/2016 15:53   Ct Head Wo Contrast  Result Date: 08/09/2016 CLINICAL DATA:  Increase falls and dizziness.  Hypotension. EXAM: CT HEAD WITHOUT CONTRAST CT CERVICAL SPINE WITHOUT CONTRAST TECHNIQUE: Multidetector CT imaging of the head and cervical spine was performed following the standard protocol without intravenous contrast. Multiplanar CT image reconstructions of the cervical spine were also generated. COMPARISON:  June 09, 2015 FINDINGS: CT HEAD FINDINGS Brain: No subdural, epidural, or subarachnoid hemorrhage. No mass, mass effect, or midline shift. Ventricles and sulci are prominent but stable. Cerebellum and brainstem are normal. Basal cisterns are patent. No acute cortical ischemia or infarct. Vascular: Calcified atherosclerosis is seen in the  intracranial carotid arteries and the basilar artery. Skull: Normal. Negative for fracture or focal lesion. Sinuses/Orbits: No acute finding. Other: No other abnormalities. CT CERVICAL SPINE FINDINGS Alignment: Normal. Skull base and vertebrae: Mild anterior wedging of C5 is favored to be chronic. No fracture or soft tissue swelling identified. Facet degenerative changes are seen in the upper cervical spine. No acute fractures are identified. Soft tissues and spinal canal: No prevertebral fluid or swelling. No visible canal hematoma. Disc levels: Mild degenerative changes including the upper sixth assessed. Upper chest: Negative. Other: No other abnormalities. IMPRESSION: 1. No acute intracranial abnormality. 2. No evidence of acute fracture or traumatic malalignment. Electronically Signed   By: Dorise Bullion III M.D   On: 08/09/2016 16:54   Ct Cervical Spine Wo Contrast  Result Date: 08/09/2016 CLINICAL DATA:  Increase falls and dizziness.  Hypotension. EXAM: CT HEAD WITHOUT CONTRAST CT CERVICAL SPINE WITHOUT CONTRAST TECHNIQUE: Multidetector CT imaging of the head and cervical spine was performed following the standard protocol without intravenous contrast. Multiplanar CT image reconstructions of the cervical spine were also generated. COMPARISON:  June 09, 2015 FINDINGS: CT HEAD FINDINGS Brain: No subdural, epidural, or subarachnoid hemorrhage. No mass, mass effect, or midline shift. Ventricles and sulci are prominent but stable. Cerebellum and brainstem are normal. Basal cisterns are patent. No acute cortical ischemia or infarct. Vascular: Calcified atherosclerosis is seen in the intracranial carotid arteries and the basilar artery. Skull: Normal. Negative for fracture or focal lesion. Sinuses/Orbits: No acute finding. Other: No other abnormalities. CT CERVICAL SPINE FINDINGS Alignment: Normal. Skull base and vertebrae: Mild anterior wedging of C5 is favored to be chronic. No fracture or soft tissue  swelling identified. Facet degenerative changes are seen in the upper cervical spine. No acute fractures are identified. Soft tissues and spinal canal: No prevertebral fluid or swelling. No visible canal hematoma. Disc levels: Mild degenerative changes including the upper sixth assessed. Upper chest: Negative. Other: No other abnormalities. IMPRESSION: 1. No acute intracranial abnormality. 2. No evidence of acute fracture or traumatic malalignment. Electronically Signed   By: Dorise Bullion III M.D   On: 08/09/2016 16:54      Medications:     Scheduled Medications: . aspirin EC  81 mg Oral Daily  . atorvastatin  40 mg Oral q1800  . clonazePAM  1 mg Oral BID  . clopidogrel  75 mg Oral Daily  . digoxin  0.0625 mg Oral Daily  . FLUoxetine  10 mg Oral Daily  . heparin  5,000 Units Subcutaneous Q8H  . potassium chloride  40 mEq Oral Once  . QUEtiapine  400 mg Oral QHS  . sodium chloride flush  3 mL Intravenous Q12H  Infusions:     PRN Medications:  acetaminophen **OR** acetaminophen, nitroGLYCERIN   Assessment:  1. Dizziness 2. Fall 3. AKI on CKD 4. Chronic Systolic Heart Failure - EF 25% byt echo 9/17 5. CAD: PCI to OM1 and D2 on 07/22/16 with no significant improvement in her symptoms with regards to hypotension.  6.Carotid Stenosis 7. Mitral Regurgitation 8. Chronic Anemia  9> DNR   Plan/Discussion:    Admitted with orthostatic hypotension, dizziness and fall. Initially received IV fluids.  From HF perspective she does not appear volume overloaded. Continue to push po intake. Renal function improving. 2.95>2.13>1.78. Not on bb/ arb/diuretics. Would not start with recurrent hypotension. Can continue dig. Dig level on 10/8 0.7.   No CP. Continue statin, aspirin, and plavix.   PT recommending. SNF     Length of Stay: 0   Amy Clegg NP-C  08/11/2016, 9:14 AM  Advanced Heart Failure Team Pager [phone] (M-F; 7a - 4p)  Please contact [facility] for  night-coverage after hours (4p -7a ) and weekends on [email or web address]  Patient seen and examined with Darrick Grinder, NP. We discussed all aspects of the encounter. I agree with the assessment and plan as stated above.   Volume status is stable. Renal function improving. HF meds limited by fatigue and hypotension.  I worry she is end-stage and also has severe depression. Refusing SSRI. Would engage Palliative Care.   Bensimhon, Daniel,MD 4:14 PM

## 2016-08-11 NOTE — Progress Notes (Signed)
PROGRESS NOTE    Caroline [name]  [id number] DOB: Jan 07, 1951 DOA: 08/09/2016 PCP: [name], PA-C   Chief Complaint  Patient presents with  . Fall  . Dizziness    Brief Narrative:  HPI on 08/09/2016 by Dr. Oren Binet Caroline [name] is a 65 y.o. female with medical history significant of CAD status post recent PCI in September, chronic systolic heart failure, depression, stage III chronic kidney disease, long-standing history of hypertension with orthostatic changes presented to the ED for evaluation of the above-noted complaints. Please note, patient has a long-standing history of hypertension and orthostatic hypotension has had numerous falls of the past few years. Per family, patient sustained a fall this past Thursday when one of her family members was visiting, she denies any loss of consciousness-she refused to come to the emergency room then. Her daughter visited her today, when she stood up to open the door, she again fell. She again denies loss of consciousness. Her daughter convinced her to come to the emergency room. Patient denies any fever, nausea, vomiting or diarrhea. She claims that she is eating relatively well. She has a very flat affect and seems depressed-but denies any suicidal or homicidal ideations. Patient also denies any chest pain or shortness of breath. She does not measure her weights daily, but does not think that she has put on some weight.  Assessment & Plan   Syncope/fall -Patient has a long-standing history of orthostatic hypotension with recurrent falls -Patient has been followed by cardiology as an outpatient -Cardiology consulted and appreciated -Continue TED hose -PT recommended SNF  Acute kidney injury superimposed on chronic kidney disease, stage III -Likely secondary dehydration and poor oral intake vs meds -Aldactone held -Creatinine upon admission 2.95,currently 1.78 (baseline ?1.4) -Continue to monitor  BMP  Hypotension/orthostatic hypotension -Has been a chronic issue, patient has been on hydrocortisone in the past with presumed adrenal insufficiency. Patient is no longer taking hydrocortisone. -Cardiology consulted and appreciated  Coronary artery disease/ troponin elevation -Patient did have PCI September 2017 -Continue aspirin, Plavix, Lipitor -Patient is not a candidate for beta blocker given her hypotension and orthostasis -Troponin may be elevated secondary to demand ischemia or worsening renal function  Chronic systolic heart failure -Echocardiogram August 2017 shows EF of 25-30% -Patient currently appears to be euvolemic -Patient currently not on an ACE inhibitor or an ARB, beta blocker due to her hypotension and renal disease -Continue digoxin. Level 0.7  Anxiety/depression -Continue Seroquel,klonopin, prozac -Psychiatry consulted and appreciated, recommended decreasing seroquel to 400mg  QHS, clonazepam 1mg  BID, discontinue risperdal.   Failure to thrive -Possibly secondary to the above, comorbidities, depression and anxiety -PT consulted and appreciated, rec SNF -pending consult nutrition   DVT Prophylaxis  lovenox  Code Status: DNR  Family Communication: None at bedside  Disposition Plan: Currently in observation.  Possibly SNF at discharge. Pending further recommendations from cardiology. Expect d/c in 24-48hrs  Consultants Cardiology Psychiatry  Procedures  None  Antibiotics   Anti-infectives    None      Subjective:   [name] seen and examined today.   Patient has no complaints this morning, feels very sleepy. Denies chest pain, shortness of breath, abdominal pain, nausea or vomiting, dizziness or headache.  Objective:   Vitals:   08/10/16 2027 08/10/16 2358 08/11/16 0344 08/11/16 1004  BP: (!) 101/45 (!) 98/45 (!) 114/54   Pulse: 78 78 78 77  Resp: 20 20 20    Temp: 97.9 F (36.6 C) 98 F (36.7 C)  97.5 F (36.4 C)   TempSrc: Oral  Oral Oral   SpO2: 98% 96% 97%   Weight:   53.3 kg (117 lb 6.4 oz)   Height:        Intake/Output Summary (Last 24 hours) at 08/11/16 1037 Last data filed at 08/11/16 1013  Gross per 24 hour  Intake              843 ml  Output             1275 ml  Net             -432 ml   Filed Weights   08/09/16 1837 08/10/16 0521 08/11/16 0344  Weight: 52.4 kg (115 lb 8.3 oz) 52.9 kg (116 lb 9.6 oz) 53.3 kg (117 lb 6.4 oz)    Exam  General: Well developed, well nourished, NAD  HEENT: NCAT, bruising noted on left nasal bridge, mucous membranes moist.   Cardiovascular: S1 S2 auscultated, 2/6SEM, RRR  Respiratory: Clear to auscultation bilaterally with equal chest rise  Abdomen: Soft, nontender, nondistended, + bowel sounds  Extremities: warm dry without cyanosis clubbing or edema  Neuro: AAOx3, nonfocal   Psych: Flat affect, depressed mood   Data Reviewed: I have personally reviewed following labs and imaging studies  CBC:  Recent Labs Lab 08/09/16 1413 08/10/16 0624  WBC 6.8 6.0  NEUTROABS 4.6  --   HGB 10.5* 9.2*  HCT 32.5* 29.2*  MCV 88.3 88.8  PLT 269 0000000   Basic Metabolic Panel:  Recent Labs Lab 08/09/16 1413 08/10/16 0624 08/10/16 0944 08/11/16 0420  NA 137 140  --  138  K 3.1* 2.9*  --  4.0  CL 101 102  --  104  CO2 25 27  --  22  GLUCOSE 115* 88  --  98  BUN 35* 26*  --  21*  CREATININE 2.95* 2.12*  --  1.78*  CALCIUM 9.0 8.9  --  9.0  MG  --   --  1.9  --    GFR: Estimated Creatinine Clearance (by C-G formula based on SCr of 1.78 mg/dL (H)) Female: 26.8 mL/min Female: 32 mL/min Liver Function Tests:  Recent Labs Lab 08/09/16 1413  AST 23  ALT 16  ALKPHOS 73  BILITOT 0.6  PROT 6.5  ALBUMIN 3.5   No results for input(s): LIPASE, AMYLASE in the last 168 hours. No results for input(s): AMMONIA in the last 168 hours. Coagulation Profile: No results for input(s): INR, PROTIME in the last 168 hours. Cardiac Enzymes:  Recent Labs Lab  08/09/16 1914 08/10/16 0203 08/10/16 0624  TROPONINI 0.10* 0.09* 0.09*   BNP (last 3 results) No results for input(s): PROBNP in the last 8760 hours. HbA1C: No results for input(s): HGBA1C in the last 72 hours. CBG:  Recent Labs Lab 08/10/16 0731 08/10/16 1116 08/10/16 1640 08/11/16 0626  GLUCAP 88 168* 145* 99   Lipid Profile: No results for input(s): CHOL, HDL, LDLCALC, TRIG, CHOLHDL, LDLDIRECT in the last 72 hours. Thyroid Function Tests: No results for input(s): TSH, T4TOTAL, FREET4, T3FREE, THYROIDAB in the last 72 hours. Anemia Panel: No results for input(s): VITAMINB12, FOLATE, FERRITIN, TIBC, IRON, RETICCTPCT in the last 72 hours. Urine analysis:    Component Value Date/Time   COLORURINE YELLOW 06/12/2015 2349   APPEARANCEUR CLEAR 06/12/2015 2349   LABSPEC 1.005 06/12/2015 2349   PHURINE 6.5 06/12/2015 2349   GLUCOSEU NEGATIVE 06/12/2015 2349   HGBUR NEGATIVE 06/12/2015 2349   BILIRUBINUR NEGATIVE 06/12/2015  [place] 06/12/2015 Scalp Level 06/12/2015 2349   UROBILINOGEN 0.2 06/12/2015 2349   NITRITE NEGATIVE 06/12/2015 2349   LEUKOCYTESUR NEGATIVE 06/12/2015 2349   Sepsis Labs: @LABRCNTIP (procalcitonin:4,lacticidven:4)  )No results found for this or any previous visit (from the past 240 hour(s)).    Radiology Studies: Dg Chest 2 View  Result Date: 08/09/2016 CLINICAL DATA:  Dizziness and fall.  Ex smoker. EXAM: CHEST  2 VIEW COMPARISON:  06/12/2015. FINDINGS: Heart size upper limits normal. Prominent coronary artery calcification with stents. No active infiltrates or failure no mediastinal masses, effusions, or pneumothorax. Previous RIGHT breast biopsy. Osteopenia. No compression fracture. Similar appearance to priors. IMPRESSION: No active cardiopulmonary disease. Electronically Signed   By: Staci Righter M.D.   On: 08/09/2016 15:53   Ct Head Wo Contrast  Result Date: 08/09/2016 CLINICAL DATA:  Increase falls and  dizziness.  Hypotension. EXAM: CT HEAD WITHOUT CONTRAST CT CERVICAL SPINE WITHOUT CONTRAST TECHNIQUE: Multidetector CT imaging of the head and cervical spine was performed following the standard protocol without intravenous contrast. Multiplanar CT image reconstructions of the cervical spine were also generated. COMPARISON:  June 09, 2015 FINDINGS: CT HEAD FINDINGS Brain: No subdural, epidural, or subarachnoid hemorrhage. No mass, mass effect, or midline shift. Ventricles and sulci are prominent but stable. Cerebellum and brainstem are normal. Basal cisterns are patent. No acute cortical ischemia or infarct. Vascular: Calcified atherosclerosis is seen in the intracranial carotid arteries and the basilar artery. Skull: Normal. Negative for fracture or focal lesion. Sinuses/Orbits: No acute finding. Other: No other abnormalities. CT CERVICAL SPINE FINDINGS Alignment: Normal. Skull base and vertebrae: Mild anterior wedging of C5 is favored to be chronic. No fracture or soft tissue swelling identified. Facet degenerative changes are seen in the upper cervical spine. No acute fractures are identified. Soft tissues and spinal canal: No prevertebral fluid or swelling. No visible canal hematoma. Disc levels: Mild degenerative changes including the upper sixth assessed. Upper chest: Negative. Other: No other abnormalities. IMPRESSION: 1. No acute intracranial abnormality. 2. No evidence of acute fracture or traumatic malalignment. Electronically Signed   By: Dorise Bullion III M.D   On: 08/09/2016 16:54   Ct Cervical Spine Wo Contrast  Result Date: 08/09/2016 CLINICAL DATA:  Increase falls and dizziness.  Hypotension. EXAM: CT HEAD WITHOUT CONTRAST CT CERVICAL SPINE WITHOUT CONTRAST TECHNIQUE: Multidetector CT imaging of the head and cervical spine was performed following the standard protocol without intravenous contrast. Multiplanar CT image reconstructions of the cervical spine were also generated. COMPARISON:   June 09, 2015 FINDINGS: CT HEAD FINDINGS Brain: No subdural, epidural, or subarachnoid hemorrhage. No mass, mass effect, or midline shift. Ventricles and sulci are prominent but stable. Cerebellum and brainstem are normal. Basal cisterns are patent. No acute cortical ischemia or infarct. Vascular: Calcified atherosclerosis is seen in the intracranial carotid arteries and the basilar artery. Skull: Normal. Negative for fracture or focal lesion. Sinuses/Orbits: No acute finding. Other: No other abnormalities. CT CERVICAL SPINE FINDINGS Alignment: Normal. Skull base and vertebrae: Mild anterior wedging of C5 is favored to be chronic. No fracture or soft tissue swelling identified. Facet degenerative changes are seen in the upper cervical spine. No acute fractures are identified. Soft tissues and spinal canal: No prevertebral fluid or swelling. No visible canal hematoma. Disc levels: Mild degenerative changes including the upper sixth assessed. Upper chest: Negative. Other: No other abnormalities. IMPRESSION: 1. No acute intracranial abnormality. 2. No evidence of acute fracture or traumatic malalignment. Electronically Signed  By: Dorise Bullion III M.D   On: 08/09/2016 16:54     Scheduled Meds: . aspirin EC  81 mg Oral Daily  . atorvastatin  40 mg Oral q1800  . clonazePAM  1 mg Oral BID  . clopidogrel  75 mg Oral Daily  . digoxin  0.0625 mg Oral Daily  . FLUoxetine  10 mg Oral Daily  . heparin  5,000 Units Subcutaneous Q8H  . potassium chloride  40 mEq Oral Once  . QUEtiapine  400 mg Oral QHS  . sodium chloride flush  3 mL Intravenous Q12H   Continuous Infusions:    LOS: 0 days   Time Spent in minutes   30 minutes  Cannon Arreola D.O. on 08/11/2016 at 10:37 AM  Between 7am to 7pm - Pager - [phone]  After 7pm go to [email or web address] - password [email or web address]  And look for the night coverage person covering for me after hours  [facility] Office  [phone]

## 2016-08-11 NOTE — Progress Notes (Signed)
Initial Nutrition Assessment  DOCUMENTATION CODES:   Non-severe (moderate) malnutrition in context of chronic illness  INTERVENTION:  Provide Ensure Enlive po BID, each supplement provides 350 kcal and 20 grams of protein Provide Multivitamin with minerals daily Consider checking TIBC and serum Fe labs to assess for anemia   NUTRITION DIAGNOSIS:   Malnutrition related to poor appetite, chronic illness as evidenced by moderate depletion of body fat, moderate depletions of muscle mass.   GOAL:   Patient will meet greater than or equal to 90% of their needs   MONITOR:   PO intake, Skin, Supplement acceptance, I & O's, Labs, Weight trends  REASON FOR ASSESSMENT:   Consult Poor PO  ASSESSMENT:   65 y.o. female with medical history significant of CAD status post recent PCI in September, chronic systolic heart failure, depression, stage III chronic kidney disease, long-standing history of hypertension with orthostatic changes presented to the ED for evaluation of lightheadedness and falls.  Pt with flat affect at time of visit and difficult to assess due to vague answers. She reports that she usually eats one meal daily and sometimes snacks. She reports eating meat/protein some days and drinking a nutritional supplement daily. She denies any weight loss, stating that she usually weighs 115 lbs. She ate about 50% of her breakfast. She reports difficulty chewing and occasional issues with constipation. She has moderate fat wasting and moderate muscle wasting per nutrition-focused physical exam. Tongue appears pale which may indicate insufficient iron.  RD discussed the importance of consistent healthful nutrition. Encouraged patient to eat 3 consistent meals daily with a variety of foods including protein, fruits, vegetables, grains and dairy. Discussed how to incorporate snacks and nutritional supplements to balance poor PO intake at meals. Pt is agreeable to plan, stating that she likes  Ensure.   Labs: low hemoglobin  Diet Order:  Diet Heart Room service appropriate? Yes; Fluid consistency: Thin  Skin:  Reviewed, no issues  Last BM:  10/8  Height:   Ht Readings from Last 1 Encounters:  08/09/16 5\' 3"  (1.6 m)    Weight:   Wt Readings from Last 1 Encounters:  08/11/16 117 lb 6.4 oz (53.3 kg)    Ideal Body Weight:  52.27 kg  BMI:  Body mass index is 20.8 kg/m.  Estimated Nutritional Needs:   Kcal:  1350-1550  Protein:  65-75 grams  Fluid:  1.4-1.6 L/day  EDUCATION NEEDS:   No education needs identified at this time  Scarlette Ar RD, CSP, LDN Inpatient Clinical Dietitian Pager: [phone] After Hours Pager: [phone]

## 2016-08-11 NOTE — Clinical Social Work Note (Signed)
CSW met with patient. No supports at bedside. CSW discussed SNF recommendation. Patient prefers to go home with HHPT. RNCM notified.  CSW signing off. Consult again if any social work needs arise.  Dayton Scrape, [place]

## 2016-08-11 NOTE — Care Management Obs Status (Signed)
MEDICARE OBSERVATION STATUS NOTIFICATION   Patient Details  Name: SHONTA SURATT MRN: [id number] Date of Birth: 1951-03-29   Medicare Observation Status Notification Given:  Yes    Royston Bake, RN 08/11/2016, 11:08 AM

## 2016-08-11 NOTE — Care Management Note (Signed)
Case Management Note  Patient Details  Name: Caroline [name] MRN: [id number] Date of Birth: Sep 01, 1951  Subjective/Objective:           Admitted with Bipolar, depression         Action/Plan: Patient lives alone, continues to drive; Patient refused SNF placement and request to return home at discharge. HHC choice offered, pt chose [facility]. Butch Penny with [facility] called for arrangements. Attending MD at discharge please enter the face to face for [facility] services.  Expected Discharge Date:   possibly 08/12/2016               Expected Discharge Plan:  [place]  In-House Referral:  Clinical Social Work  Discharge planning Services  CM Consult  Post Acute Care Choice:    Choice offered to:  Patient  HH Arranged:  RN, PT, Nurse's Aide [place] Agency:  [place]  Status of Service:  In process, will continue to follow  Sherrilyn Rist [phone] 08/11/2016, 11:09 AM

## 2016-08-12 ENCOUNTER — Encounter: Payer: Self-pay | Admitting: Family

## 2016-08-12 DIAGNOSIS — N183 Chronic kidney disease, stage 3 (moderate): Secondary | ICD-10-CM | POA: Diagnosis present

## 2016-08-12 DIAGNOSIS — I130 Hypertensive heart and chronic kidney disease with heart failure and stage 1 through stage 4 chronic kidney disease, or unspecified chronic kidney disease: Secondary | ICD-10-CM | POA: Diagnosis present

## 2016-08-12 DIAGNOSIS — I4891 Unspecified atrial fibrillation: Secondary | ICD-10-CM | POA: Diagnosis present

## 2016-08-12 DIAGNOSIS — F3175 Bipolar disorder, in partial remission, most recent episode depressed: Secondary | ICD-10-CM | POA: Diagnosis not present

## 2016-08-12 DIAGNOSIS — I2510 Atherosclerotic heart disease of native coronary artery without angina pectoris: Secondary | ICD-10-CM | POA: Diagnosis present

## 2016-08-12 DIAGNOSIS — I255 Ischemic cardiomyopathy: Secondary | ICD-10-CM | POA: Diagnosis present

## 2016-08-12 DIAGNOSIS — I5022 Chronic systolic (congestive) heart failure: Secondary | ICD-10-CM | POA: Diagnosis not present

## 2016-08-12 DIAGNOSIS — I951 Orthostatic hypotension: Secondary | ICD-10-CM | POA: Diagnosis not present

## 2016-08-12 DIAGNOSIS — I252 Old myocardial infarction: Secondary | ICD-10-CM | POA: Diagnosis not present

## 2016-08-12 DIAGNOSIS — N179 Acute kidney failure, unspecified: Secondary | ICD-10-CM | POA: Diagnosis not present

## 2016-08-12 DIAGNOSIS — R296 Repeated falls: Secondary | ICD-10-CM | POA: Diagnosis present

## 2016-08-12 DIAGNOSIS — Z66 Do not resuscitate: Secondary | ICD-10-CM | POA: Diagnosis present

## 2016-08-12 DIAGNOSIS — E440 Moderate protein-calorie malnutrition: Secondary | ICD-10-CM | POA: Diagnosis present

## 2016-08-12 DIAGNOSIS — F17210 Nicotine dependence, cigarettes, uncomplicated: Secondary | ICD-10-CM | POA: Diagnosis present

## 2016-08-12 DIAGNOSIS — E785 Hyperlipidemia, unspecified: Secondary | ICD-10-CM | POA: Diagnosis present

## 2016-08-12 DIAGNOSIS — E876 Hypokalemia: Secondary | ICD-10-CM | POA: Diagnosis present

## 2016-08-12 DIAGNOSIS — Z6820 Body mass index (BMI) 20.0-20.9, adult: Secondary | ICD-10-CM | POA: Diagnosis not present

## 2016-08-12 DIAGNOSIS — R627 Adult failure to thrive: Secondary | ICD-10-CM | POA: Diagnosis present

## 2016-08-12 LAB — GLUCOSE, CAPILLARY: Glucose-Capillary: 96 mg/dL (ref 65–99)

## 2016-08-12 LAB — BASIC METABOLIC PANEL
ANION GAP: 8 (ref 5–15)
BUN: 20 mg/dL (ref 6–20)
CALCIUM: 9.7 mg/dL (ref 8.9–10.3)
CO2: 28 mmol/L (ref 22–32)
CREATININE: 1.57 mg/dL — AB (ref 0.44–1.00)
Chloride: 102 mmol/L (ref 101–111)
GFR, EST AFRICAN AMERICAN: 39 mL/min — AB (ref >60)
GFR, EST NON AFRICAN AMERICAN: 34 mL/min — AB (ref >60)
GLUCOSE: 133 mg/dL — AB (ref 65–99)
Potassium: 4.4 mmol/L (ref 3.5–5.1)
Sodium: 138 mmol/L (ref 135–145)

## 2016-08-12 MED ORDER — MIDODRINE HCL 5 MG PO TABS
5.0000 mg | ORAL_TABLET | Freq: Three times a day (TID) | ORAL | Status: DC
  Administered 2016-08-12 – 2016-08-13 (×4): 5 mg via ORAL
  Filled 2016-08-12 (×4): qty 1

## 2016-08-12 NOTE — Progress Notes (Signed)
Palliative Medicine RN Note: Rec'd referral and call from Dr Ree Kida; there is concern about d/c plan, as pt has orthostatic hypotension and lives alone.   PMT met with pt and her daughter Loistine Chance. Both Beth and pt state that d/c plan is for her and her pet bird to go to her cousin [place] [facility] [phone]). Katharine Look has cared for many of the family members during illnesses and at EOL. HH is already set up with [facility] for RN, PT, HHA. Pt has walkers at home and knows how to use them. Katharine Look is also familiar w DME use and how to move weak patients. Discussed pt's potential for [facility] d/c if she does not progress and introduced idea of home hospice.  Very long (>1 hour) discussion about hospice and hospice eligibility. Discussed use of hospice if goal is to not return to hospital and the 24 hour availability of hospice staff. Pt very tearful; both parents died on hospice and had very rapid demises. Pt is afraid we are trying to tell her she's about to die, and daughter is frustrated that no one has given them a prognosis. Discussed course of heart disease in presence of kidney disease and what the physicians have told her about prognosis as well as difficulty in predicting cardiac illness as well as hospice requirement of life expectancy <6 months. Daughter and pt verbalize understanding.  Pt remained tearful after discussion. PMT RN sat with pt and daughter and allowed for silence and crying. Patient plans to watch TV tonight. She denies pain or shortness of breath. Daughter will be out of town tomorrow, but pt's cousin will be available to pick her up, as d/c is expected tomorrow. Plan for PMT RN to f/u at noon tomorrow, but will see earlier if pt d/c earlier.   No change to d/c plan at this time. Pt will consider hospice after [facility].  Updated Dr Ree Kida.  Marjie Skiff Momodou Consiglio, RN, BSN, [facility] 08/12/2016 3:44 PM Cell [phone] 8:00-4:00 Monday-Friday Office [phone]

## 2016-08-12 NOTE — Progress Notes (Signed)
Advanced Heart Failure Rounding Note   Subjective:   Admitted with dizziness and orthostatic hypotension. Received IV fluids. Off all diuretics.   Denies SOB/dizziness/CP. Wants to go home.   Seen by CR this am and BP dropped 30 points with standing 105->75.    ECHO 06/25/2016 EF 25%   Objective:   Weight Range:  Vital Signs:   Temp:  [97.2 F (36.2 C)-99 F (37.2 C)] 97.2 F (36.2 C) (10/10 0608) Pulse Rate:  [76-90] 76 (10/10 0608) Resp:  [16-18] 16 (10/10 0608) BP: (76-122)/(33-69) 92/54 (10/10 0608) SpO2:  [97 %-100 %] 97 % (10/10 0608) Weight:  [117 lb 1.6 oz (53.1 kg)] 117 lb 1.6 oz (53.1 kg) (10/10 0608) Last BM Date: 08/11/16  Weight change: Filed Weights   08/10/16 0521 08/11/16 0344 08/12/16 0608  Weight: 116 lb 9.6 oz (52.9 kg) 117 lb 6.4 oz (53.3 kg) 117 lb 1.6 oz (53.1 kg)    Intake/Output:   Intake/Output Summary (Last 24 hours) at 08/12/16 0912 Last data filed at 08/12/16 [id number]  Gross per 24 hour  Intake             1443 ml  Output             2850 ml  Net            -1407 ml     Physical Exam: General:  Elderly. Frail. No resp difficulty. In bed.  HEENT: normal Nose ecchymotic  Neck: supple. JVP 5-6 . Carotids 2+ bilat; no bruits. No lymphadenopathy or thryomegaly appreciated. Cor: PMI nondisplaced. Regular rate & rhythm. No rubs, gallops or murmurs. Lungs: clear Abdomen: soft, nontender, nondistended. No hepatosplenomegaly. No bruits or masses. Good bowel sounds. Extremities: no cyanosis, clubbing, rash, edema Neuro: alert & orientedx3, cranial nerves grossly intact. moves all 4 extremities w/o difficulty. Affect pleasant  Telemetry: Sinus Rhythm 60s   Labs: Basic Metabolic Panel:  Recent Labs Lab 08/09/16 1413 08/10/16 0624 08/10/16 0944 08/11/16 0420 08/12/16 0202  NA 137 140  --  138 138  K 3.1* 2.9*  --  4.0 4.4  CL 101 102  --  104 102  CO2 25 27  --  22 28  GLUCOSE 115* 88  --  98 133*  BUN 35* 26*  --  21* 20    CREATININE 2.95* 2.12*  --  1.78* 1.57*  CALCIUM 9.0 8.9  --  9.0 9.7  MG  --   --  1.9  --   --     Liver Function Tests:  Recent Labs Lab 08/09/16 1413  AST 23  ALT 16  ALKPHOS 73  BILITOT 0.6  PROT 6.5  ALBUMIN 3.5   No results for input(s): LIPASE, AMYLASE in the last 168 hours. No results for input(s): AMMONIA in the last 168 hours.  CBC:  Recent Labs Lab 08/09/16 1413 08/10/16 0624  WBC 6.8 6.0  NEUTROABS 4.6  --   HGB 10.5* 9.2*  HCT 32.5* 29.2*  MCV 88.3 88.8  PLT 269 249    Cardiac Enzymes:  Recent Labs Lab 08/09/16 1914 08/10/16 0203 08/10/16 0624  TROPONINI 0.10* 0.09* 0.09*    BNP: BNP (last 3 results)  Recent Labs  06/18/16 1425  BNP 275.8*    ProBNP (last 3 results) No results for input(s): PROBNP in the last 8760 hours.    Other results:  Imaging: No results found.   Medications:     Scheduled Medications: . aspirin EC  81 mg  Oral Daily  . atorvastatin  40 mg Oral q1800  . clonazePAM  1 mg Oral BID  . clopidogrel  75 mg Oral Daily  . digoxin  0.0625 mg Oral Daily  . feeding supplement (ENSURE ENLIVE)  237 mL Oral BID PC  . FLUoxetine  10 mg Oral Daily  . heparin  5,000 Units Subcutaneous Q8H  . multivitamin with minerals  1 tablet Oral Daily  . potassium chloride  40 mEq Oral Once  . QUEtiapine  400 mg Oral QHS  . sodium chloride flush  3 mL Intravenous Q12H    Infusions:    PRN Medications: acetaminophen **OR** acetaminophen, nitroGLYCERIN   Assessment:  1. Dizziness 2. Fall 3. AKI on CKD 4. Chronic Systolic Heart Failure - EF 25% byt echo 9/17 5. CAD: PCI to OM1 and D2 on 07/22/16 with no significant improvement in her symptoms with regards to hypotension.  6.Carotid Stenosis 7. Mitral Regurgitation 8. Chronic Anemia  9. DNR 10. Orthostatic hypotension    Plan/Discussion:    Admitted with orthostatic hypotension, dizziness and fall. Initially received IV fluids.  From HF perspective she does  not appear volume overloaded. Continue to push po intake. Renal function improving. 2.95>2.13>1.78>1.57 . Not on bb/ arb/diuretics. Would not start with recurrent hypotension. Can continue dig. Dig level on 10/8 0.7.   No CP. Continue statin, aspirin, and plavix.   PT recommending SNF. Refuses SNF.   Palliative Care consult pending. Ideally needs Hospice.    Length of Stay: 0   Amy Clegg NP-C  08/12/2016, 9:12 AM  Advanced Heart Failure Team Pager [phone] (M-F; 7a - 4p)  Please contact [facility] for night-coverage after hours (4p -7a ) and weekends on [email or web address]  Patient seen and examined with Darrick Grinder, NP. We discussed all aspects of the encounter. I agree with the assessment and plan as stated above.   Remains very orthostatic despite holding diuretics. Will start midodrine 5 tid. Unable to tolerate any HF meds at this time. Renal function improved. Palliative Care Consult pending. Appreciate Psych input.   Naleah Kofoed,MD 3:02 PM

## 2016-08-12 NOTE — Progress Notes (Signed)
Physical Therapy Treatment Patient Details Name: Caroline [name] MRN: [id number] DOB: [birth year] Today's Date: 08/12/2016    History of Present Illness Patient is a 65 yo female admitted 08/09/16 with syncope and fall x2, and FTT.   PMH:  CAD, MI, PCI, CHF, anxiety, depression, bipolar disorder, CKD, HTN, Afib    PT Comments    Remains severely orthostatic with mobility. (See vitals flow sheet for BPs, HR.) Initially denies symptoms, however with education re: what she might feel, she admits to wooziness and legs feeling heavy. Educated on EOB exercises (primarily UE) to elevate BP. Dr Jeffie Pollock in during session and informed. RN also aware.    Follow Up Recommendations  SNF;Supervision/Assistance - 24 hour (pt currently refusing SNF)     Equipment Recommendations  None recommended by PT    Recommendations for Other Services OT consult     Precautions / Restrictions Precautions Precautions: Fall Precaution Comments: Multiple falls at home, including fall down her stairs Restrictions Weight Bearing Restrictions: No RLE Weight Bearing: Non weight bearing    Mobility  Bed Mobility Overal bed mobility: Needs Assistance Bed Mobility: Supine to Sit     Supine to sit: Min guard     General bed mobility comments: Assist for safety. Initially denies dizziness with transition. When noted BP significantly dropped, educated pt on symptoms of orthostasis and she ultimately reported "wooziness" and "feeling like legs are heavy" Good balance in sitting.    Transfers Overall transfer level: Needs assistance Equipment used: None Transfers: Sit to/from Stand Sit to Stand: Min guard         General transfer comment: x 3; minguard for safety due to orthostaasis  Ambulation/Gait Ambulation/Gait assistance: Min assist Ambulation Distance (Feet): 10 Feet Assistive device: None Gait Pattern/deviations: Step-through pattern;Decreased stride length     General Gait Details: limited to  chair due to BP drops   Stairs            Wheelchair Mobility    Modified Rankin (Stroke Patients Only)       Balance Overall balance assessment: Needs assistance         Standing balance support: No upper extremity supported Standing balance-Leahy Scale: Fair Standing balance comment: further balance assessment deferred due to orthostasis                    Cognition Arousal/Alertness: Awake/alert Behavior During Therapy: Flat affect Overall Cognitive Status: Within Functional Limits for tasks assessed                      Exercises General Exercises - Upper Extremity Elbow Flexion: AROM;Both;10 reps;Seated Digit Composite Flexion: AROM;Both;15 reps Composite Extension: AROM;Both;Seated General Exercises - Lower Extremity Ankle Circles/Pumps: AROM;Both;10 reps;Seated    General Comments        Pertinent Vitals/Pain Pain Assessment: No/denies pain    Home Living                      Prior Function            PT Goals (current goals can now be found in the care plan section) Acute Rehab PT Goals Patient Stated Goal: Wants to go home Time For Goal Achievement: 08/24/16 Progress towards PT goals: Not progressing toward goals - comment    Frequency    Min 3X/week      PT Plan Current plan remains appropriate    Co-evaluation  End of Session Equipment Utilized During Treatment: Gait belt Activity Tolerance: Treatment limited secondary to medical complications (Comment) (orthostasis) Patient left: with call bell/phone within reach;in chair;with chair alarm set     Time: [phone] PT Time Calculation (min) (ACUTE ONLY): 28 min  Charges:  $Therapeutic Exercise: 8-22 mins $Therapeutic Activity: 8-22 mins                    G Codes:      Jaevion Goto Aug 13, 2016, 10:35 AM  Pager [phone]

## 2016-08-12 NOTE — Progress Notes (Signed)
PROGRESS NOTE    Caroline [name]  [id number] DOB: 09-26-1951 DOA: 08/09/2016 PCP: [name], PA-C   Chief Complaint  Patient presents with  . Fall  . Dizziness    Brief Narrative:  HPI on 08/09/2016 by Dr. Oren Binet Caroline [name] is a 65 y.o. female with medical history significant of CAD status post recent PCI in September, chronic systolic heart failure, depression, stage III chronic kidney disease, long-standing history of hypertension with orthostatic changes presented to the ED for evaluation of the above-noted complaints. Please note, patient has a long-standing history of hypertension and orthostatic hypotension has had numerous falls of the past few years. Per family, patient sustained a fall this past Thursday when one of her family members was visiting, she denies any loss of consciousness-she refused to come to the emergency room then. Her daughter visited her today, when she stood up to open the door, she again fell. She again denies loss of consciousness. Her daughter convinced her to come to the emergency room. Patient denies any fever, nausea, vomiting or diarrhea. She claims that she is eating relatively well. She has a very flat affect and seems depressed-but denies any suicidal or homicidal ideations. Patient also denies any chest pain or shortness of breath. She does not measure her weights daily, but does not think that she has put on some weight.  Interim history Patient seen by CHF team.  Feels that she needs palliative care consult- pending. PT rec SNF, but she refused.   Assessment & Plan   Syncope/fall -Patient has a long-standing history of orthostatic hypotension with recurrent falls -Patient has been followed by cardiology as an outpatient -Cardiology consulted and appreciated -Continue TED hose -PT recommended SNF, patient refuses -Upon standing today, BP 77/46 -Palliative care consulted and appreciated -Ideally patient should be followed  by hospice  Acute kidney injury superimposed on chronic kidney disease, stage III -Likely secondary dehydration and poor oral intake vs meds -Aldactone held -Creatinine upon admission 2.95,currently 1.57 (baseline ?1.4) -Continue to monitor BMP  Hypotension/orthostatic hypotension -Has been a chronic issue, patient has been on hydrocortisone in the past with presumed adrenal insufficiency. Patient is no longer taking hydrocortisone. -Cardiology consulted and appreciated -BP upon standing today was 77/46  Coronary artery disease/ troponin elevation -Patient did have PCI September 2017 -Continue aspirin, Plavix, Lipitor -Patient is not a candidate for beta blocker given her hypotension and orthostasis -Troponin may be elevated secondary to demand ischemia or worsening renal function  Chronic systolic heart failure -Echocardiogram August 2017 shows EF of 25-30% -Patient currently appears to be euvolemic -Patient currently not on an ACE inhibitor or an ARB, beta blocker due to her hypotension and renal disease -Continue digoxin. Level 0.7  Anxiety/depression -Continue Seroquel,klonopin, prozac -Psychiatry consulted and appreciated, recommended decreasing seroquel to 400mg  QHS, clonazepam 1mg  BID, discontinue risperdal.  -Patient upset with the change in her medications.  Explained that some of her medications can also cause dizziness.   Failure to thrive/moderate malnutrition -Possibly secondary to the above, comorbidities, depression and anxiety -PT consulted and appreciated, rec SNF -Nutrition consulted, recommended multivitamin and feeding supplements  DVT Prophylaxis  lovenox  Code Status: DNR  Family Communication: None at bedside  Disposition Plan: Currently in observation. Palliative care consulted and pending.  Patient refused SNF.  Will return home with [facility].  Unsafe discharge today due to standing BP of 77/46.  Consultants Cardiology Psychiatry Palliative  care  Procedures  None  Antibiotics   Anti-infectives  None      Subjective:   Caroline [name] seen and examined today.   Patient has no complaints this morning, and would like to return home.  Open to palliative care consult. Denies chest pain, shortness of breath, abdominal pain, nausea or vomiting, dizziness or headache.  Objective:   Vitals:   08/11/16 2349 08/12/16 0608 08/12/16 0955 08/12/16 0958  BP: (!) 122/54 (!) 92/54 (!) 106/49   Pulse: 88 76 80 86  Resp: 16 16    Temp: 98.4 F (36.9 C) 97.2 F (36.2 C)    TempSrc: Oral Oral    SpO2: 100% 97%    Weight:  53.1 kg (117 lb 1.6 oz)    Height:        Intake/Output Summary (Last 24 hours) at 08/12/16 1141 Last data filed at 08/12/16 1012  Gross per 24 hour  Intake             1917 ml  Output             2850 ml  Net             -933 ml   Filed Weights   08/10/16 0521 08/11/16 0344 08/12/16 0608  Weight: 52.9 kg (116 lb 9.6 oz) 53.3 kg (117 lb 6.4 oz) 53.1 kg (117 lb 1.6 oz)    Exam  General: Well developed, well nourished, NAD  HEENT: NCAT, bruising noted on left nasal bridge-improving, mucous membranes moist.   Cardiovascular: S1 S2 auscultated, 2/6SEM, RRR  Respiratory: Clear to auscultation bilaterally with equal chest rise  Abdomen: Soft, nontender, nondistended, + bowel sounds  Extremities: warm dry without cyanosis clubbing or edema  Neuro: AAOx3, nonfocal   Psych: Flat affect, depressed mood   Data Reviewed: I have personally reviewed following labs and imaging studies  CBC:  Recent Labs Lab 08/09/16 1413 08/10/16 0624  WBC 6.8 6.0  NEUTROABS 4.6  --   HGB 10.5* 9.2*  HCT 32.5* 29.2*  MCV 88.3 88.8  PLT 269 0000000   Basic Metabolic Panel:  Recent Labs Lab 08/09/16 1413 08/10/16 0624 08/10/16 0944 08/11/16 0420 08/12/16 0202  NA 137 140  --  138 138  K 3.1* 2.9*  --  4.0 4.4  CL 101 102  --  104 102  CO2 25 27  --  22 28  GLUCOSE 115* 88  --  98 133*  BUN 35* 26*  --   21* 20  CREATININE 2.95* 2.12*  --  1.78* 1.57*  CALCIUM 9.0 8.9  --  9.0 9.7  MG  --   --  1.9  --   --    GFR: Estimated Creatinine Clearance (by C-G formula based on SCr of 1.57 mg/dL (H)) Female: 30.3 mL/min Female: 36.2 mL/min Liver Function Tests:  Recent Labs Lab 08/09/16 1413  AST 23  ALT 16  ALKPHOS 73  BILITOT 0.6  PROT 6.5  ALBUMIN 3.5   No results for input(s): LIPASE, AMYLASE in the last 168 hours. No results for input(s): AMMONIA in the last 168 hours. Coagulation Profile: No results for input(s): INR, PROTIME in the last 168 hours. Cardiac Enzymes:  Recent Labs Lab 08/09/16 1914 08/10/16 0203 08/10/16 0624  TROPONINI 0.10* 0.09* 0.09*   BNP (last 3 results) No results for input(s): PROBNP in the last 8760 hours. HbA1C: No results for input(s): HGBA1C in the last 72 hours. CBG:  Recent Labs Lab 08/10/16 0731 08/10/16 1116 08/10/16 1640 08/11/16 0626 08/12/16 0607  GLUCAP 88  168* 145* 99 96   Lipid Profile: No results for input(s): CHOL, HDL, LDLCALC, TRIG, CHOLHDL, LDLDIRECT in the last 72 hours. Thyroid Function Tests: No results for input(s): TSH, T4TOTAL, FREET4, T3FREE, THYROIDAB in the last 72 hours. Anemia Panel: No results for input(s): VITAMINB12, FOLATE, FERRITIN, TIBC, IRON, RETICCTPCT in the last 72 hours. Urine analysis:    Component Value Date/Time   COLORURINE YELLOW 06/12/2015 [place] 06/12/2015 2349   LABSPEC 1.005 06/12/2015 2349   PHURINE 6.5 06/12/2015 2349   GLUCOSEU NEGATIVE 06/12/2015 2349   HGBUR NEGATIVE 06/12/2015 2349   BILIRUBINUR NEGATIVE 06/12/2015 2349   KETONESUR NEGATIVE 06/12/2015 2349   PROTEINUR NEGATIVE 06/12/2015 2349   UROBILINOGEN 0.2 06/12/2015 2349   NITRITE NEGATIVE 06/12/2015 2349   LEUKOCYTESUR NEGATIVE 06/12/2015 2349   Sepsis Labs: @LABRCNTIP (procalcitonin:4,lacticidven:4)  )No results found for this or any previous visit (from the past 240 hour(s)).    Radiology  Studies: No results found.   Scheduled Meds: . aspirin EC  81 mg Oral Daily  . atorvastatin  40 mg Oral q1800  . clonazePAM  1 mg Oral BID  . clopidogrel  75 mg Oral Daily  . digoxin  0.0625 mg Oral Daily  . feeding supplement (ENSURE ENLIVE)  237 mL Oral BID PC  . FLUoxetine  10 mg Oral Daily  . heparin  5,000 Units Subcutaneous Q8H  . midodrine  5 mg Oral TID WC  . multivitamin with minerals  1 tablet Oral Daily  . potassium chloride  40 mEq Oral Once  . QUEtiapine  400 mg Oral QHS  . sodium chloride flush  3 mL Intravenous Q12H   Continuous Infusions:    LOS: 0 days   Time Spent in minutes   30 minutes  Ioanna Colquhoun D.O. on 08/12/2016 at 11:41 AM  Between 7am to 7pm - Pager - [phone]  After 7pm go to [email or web address] - password [email or web address]  And look for the night coverage person covering for me after hours  [facility] Office  [phone]

## 2016-08-13 LAB — BASIC METABOLIC PANEL
ANION GAP: 9 (ref 5–15)
BUN: 20 mg/dL (ref 6–20)
CO2: 26 mmol/L (ref 22–32)
Calcium: 9.8 mg/dL (ref 8.9–10.3)
Chloride: 100 mmol/L — ABNORMAL LOW (ref 101–111)
Creatinine, Ser: 1.41 mg/dL — ABNORMAL HIGH (ref 0.44–1.00)
GFR calc Af Amer: 45 mL/min — ABNORMAL LOW (ref >60)
GFR, EST NON AFRICAN AMERICAN: 39 mL/min — AB (ref >60)
GLUCOSE: 100 mg/dL — AB (ref 65–99)
POTASSIUM: 4.4 mmol/L (ref 3.5–5.1)
Sodium: 135 mmol/L (ref 135–145)

## 2016-08-13 LAB — GLUCOSE, CAPILLARY
GLUCOSE-CAPILLARY: 100 mg/dL — AB (ref 65–99)
GLUCOSE-CAPILLARY: 108 mg/dL — AB (ref 65–99)

## 2016-08-13 MED ORDER — FLUOXETINE HCL 10 MG PO CAPS: 10.0000 mg | ORAL_CAPSULE | Freq: Every day | ORAL | 0 refills | Status: DC

## 2016-08-13 MED ORDER — ENSURE ENLIVE PO LIQD: 237.0000 mL | Freq: Two times a day (BID) | ORAL | 12 refills | Status: DC

## 2016-08-13 MED ORDER — MIDODRINE HCL 5 MG PO TABS: 5.0000 mg | ORAL_TABLET | Freq: Three times a day (TID) | ORAL | 0 refills | Status: DC

## 2016-08-13 MED ORDER — CLONAZEPAM 1 MG PO TABS: 1.0000 mg | ORAL_TABLET | Freq: Two times a day (BID) | ORAL | 0 refills | Status: DC

## 2016-08-13 NOTE — Progress Notes (Signed)
Physical Therapy Treatment Patient Details Name: Caroline [name] MRN: [id number] DOB: 03/23/1951 Today's Date: 08/13/2016    History of Present Illness Patient is a 65 yo female admitted 08/09/16 with syncope and fall x2, and FTT.   PMH:  CAD, MI, PCI, CHF, anxiety, depression, bipolar disorder, CKD, HTN, Afib    PT Comments    Patient states she was able to tolerate upright sitting in chair yesterday x 1.5 hrs (PT had assisted her up). BP initially orthostatic with sit to stand and no TED hose. Assisted pt to supine and she donned her thigh high TEDs. BP with slight increase with sit to stand and able to ambulate (only slightly unsteady with minguard assist for safety). She self-selected to walk 130 ft and on return to her room her BP had decreased (See vitals flow sheet for all BPs).   Reinforced need to wear TED hose and to don while supine before she even gets out of bed. Reinforced the symptoms she has noted when BP gets low (although at times she has also been asymptomatic). Educated to begin with walking shorter distances.    Follow Up Recommendations  Supervision/Assistance - 24 hour;Home health PT--unless discharging with Hospice & would not qualify for both  (going to her cousin's house)     Equipment Recommendations  None recommended by PT    Recommendations for Other Services OT consult     Precautions / Restrictions Precautions Precautions: Fall Precaution Comments: Multiple falls at home, including fall down her stairs Restrictions Weight Bearing Restrictions: No    Mobility  Bed Mobility               General bed mobility comments: sitting EOB with breakfast  Transfers Overall transfer level: Needs assistance Equipment used: None   Sit to Stand: Min guard         General transfer comment: x 3; minguard for safety due to orthostaasis  Ambulation/Gait Ambulation/Gait assistance: Min guard Ambulation Distance (Feet): 130 Feet Assistive device:  None Gait Pattern/deviations: Step-through pattern;Decreased stride length;Drifts right/left   Gait velocity interpretation: Below normal speed for age/gender General Gait Details: able to ambulate after thigh high TED hose donned (pt able to don herself). Pt encouraged to determine how far she could walk. She was asymptomatic when BP dropped   Stairs            Wheelchair Mobility    Modified Rankin (Stroke Patients Only)       Balance           Standing balance support: No upper extremity supported Standing balance-Leahy Scale: Fair                      Cognition Arousal/Alertness: Awake/alert Behavior During Therapy: Flat affect Overall Cognitive Status: Within Functional Limits for tasks assessed                      Exercises      General Comments        Pertinent Vitals/Pain Pain Assessment: No/denies pain    Home Living Family/patient expects to be discharged to:: Private residence Living Arrangements: Other relatives (cousin) Available Help at Discharge: Family;Available 24 hours/day (cousin) Type of Home: House Home Access: Stairs to enter   Home Layout: One level Home Equipment: Environmental consultant - 2 wheels;Cane - single point;Bedside commode;Shower seat;Wheelchair - manual      Prior Function            PT Goals (  current goals can now be found in the care plan section) Acute Rehab PT Goals Patient Stated Goal: Wants to go home Time For Goal Achievement: 08/24/16 Progress towards PT goals: Progressing toward goals    Frequency    Min 3X/week      PT Plan Discharge plan needs to be updated    Co-evaluation             End of Session Equipment Utilized During Treatment: Gait belt Activity Tolerance: Treatment limited secondary to medical complications (Comment) (decr BP) Patient left: with call bell/phone within reach;in chair;with chair alarm set     Time: [phone] PT Time Calculation (min) (ACUTE ONLY): 36  min  Charges:  $Gait Training: 23-37 mins                    G Codes:      Karter Haire 09-06-2016, 9:26 AM Pager [phone]

## 2016-08-13 NOTE — Progress Notes (Signed)
Patient ID: Caroline [name], female   DOB: 1951/08/17, 65 y.o.   MRN: [id number]    Advanced Heart Failure Rounding Note   Subjective:    Admitted with dizziness and orthostatic hypotension. Received IV fluids. Off all diuretics.   Denies SOB/dizziness/CP. Wants to go home.   Given orthostatic hypotension, midodrine added.  She says that she did not get out of bed yesterday though.  BP appears stable.    TEE 9/17: Moderately dilated LV, EF 25%, severe central mitral regurgitation (likely functional).    Objective:   Weight Range:  Vital Signs:   Temp:  [98.2 F (36.8 C)-99.1 F (37.3 C)] 98.2 F (36.8 C) (10/11 0350) Pulse Rate:  [80-92] 90 (10/11 0350) Resp:  [16-18] 18 (10/11 0350) BP: (95-127)/(48-56) 95/55 (10/11 0350) SpO2:  [98 %-100 %] 100 % (10/11 0350) Weight:  [120 lb 1.6 oz (54.5 kg)] 120 lb 1.6 oz (54.5 kg) (10/11 0350) Last BM Date: 08/12/16  Weight change: Filed Weights   08/11/16 0344 08/12/16 0608 08/13/16 0350  Weight: 117 lb 6.4 oz (53.3 kg) 117 lb 1.6 oz (53.1 kg) 120 lb 1.6 oz (54.5 kg)    Intake/Output:   Intake/Output Summary (Last 24 hours) at 08/13/16 0806 Last data filed at 08/12/16 2021  Gross per 24 hour  Intake             1547 ml  Output             1400 ml  Net              147 ml     Physical Exam: General:  Elderly. Frail. No resp difficulty. In bed.  HEENT: normal Nose ecchymotic  Neck: supple. JVP 5-6 . Carotids 2+ bilat; no bruits. No lymphadenopathy or thryomegaly appreciated. Cor: PMI nondisplaced. Regular rate & rhythm. No rubs, gallops.  1/6 HSM apex. Lungs: clear Abdomen: soft, nontender, nondistended. No hepatosplenomegaly. No bruits or masses. Good bowel sounds. Extremities: no cyanosis, clubbing, rash, edema Neuro: alert & orientedx3, cranial nerves grossly intact. moves all 4 extremities w/o difficulty. Affect pleasant  Telemetry: Sinus Rhythm 60s   Labs: Basic Metabolic Panel:  Recent Labs Lab 08/09/16 1413  08/10/16 0624 08/10/16 0944 08/11/16 0420 08/12/16 0202 08/13/16 0319  NA 137 140  --  138 138 135  K 3.1* 2.9*  --  4.0 4.4 4.4  CL 101 102  --  104 102 100*  CO2 25 27  --  22 28 26   GLUCOSE 115* 88  --  98 133* 100*  BUN 35* 26*  --  21* 20 20  CREATININE 2.95* 2.12*  --  1.78* 1.57* 1.41*  CALCIUM 9.0 8.9  --  9.0 9.7 9.8  MG  --   --  1.9  --   --   --     Liver Function Tests:  Recent Labs Lab 08/09/16 1413  AST 23  ALT 16  ALKPHOS 73  BILITOT 0.6  PROT 6.5  ALBUMIN 3.5   No results for input(s): LIPASE, AMYLASE in the last 168 hours. No results for input(s): AMMONIA in the last 168 hours.  CBC:  Recent Labs Lab 08/09/16 1413 08/10/16 0624  WBC 6.8 6.0  NEUTROABS 4.6  --   HGB 10.5* 9.2*  HCT 32.5* 29.2*  MCV 88.3 88.8  PLT 269 249    Cardiac Enzymes:  Recent Labs Lab 08/09/16 1914 08/10/16 0203 08/10/16 0624  TROPONINI 0.10* 0.09* 0.09*    BNP: BNP (last  3 results)  Recent Labs  06/18/16 1425  BNP 275.8*    ProBNP (last 3 results) No results for input(s): PROBNP in the last 8760 hours.    Other results:  Imaging: No results found.   Medications:     Scheduled Medications: . aspirin EC  81 mg Oral Daily  . atorvastatin  40 mg Oral q1800  . clonazePAM  1 mg Oral BID  . clopidogrel  75 mg Oral Daily  . digoxin  0.0625 mg Oral Daily  . feeding supplement (ENSURE ENLIVE)  237 mL Oral BID PC  . FLUoxetine  10 mg Oral Daily  . heparin  5,000 Units Subcutaneous Q8H  . midodrine  5 mg Oral TID WC  . multivitamin with minerals  1 tablet Oral Daily  . potassium chloride  40 mEq Oral Once  . QUEtiapine  400 mg Oral QHS  . sodium chloride flush  3 mL Intravenous Q12H    Infusions:    PRN Medications: acetaminophen **OR** acetaminophen, nitroGLYCERIN   Assessment:  1. Dizziness 2. Fall 3. AKI on CKD 4. Chronic Systolic Heart Failure - EF 25% by echo 9/17 5. CAD: PCI to OM1 and D2 on 07/22/16 with no significant  improvement in her symptoms with regards to hypotension.  6. Carotid Stenosis 7. Mitral Regurgitation: Severe functional MR on 9/17 TEE.  8. Chronic Anemia  9. DNR 10. Orthostatic hypotension    Plan/Discussion:    Admitted with orthostatic hypotension, dizziness and fall. Initially received IV fluids.  Now on midodrine.  Needs to get out of bed today.   From HF perspective she does not appear volume overloaded. Renal function improving. 2.95>2.13>1.78>1.57>1.4. Not on bb/acei/diuretics. Would not start. Can continue digoxin, digoxin level on 10/8 0.7.  Would continue midodrine at home.  No CP. Continue statin, aspirin, and plavix.   PT recommending SNF. Refuses SNF. Seen by palliative care, tentative plan now is for her to go home today and to stay with her cousin.  She will consider transition to hospice.   We will arrange CHF clinic followup.  Length of Stay: 1  Loralie Champagne  08/13/2016, 8:06 AM  Advanced Heart Failure Team Pager [phone] (M-F; 7a - 4p)  Please contact [facility] for night-coverage after hours (4p -7a ) and weekends on [email or web address]

## 2016-08-13 NOTE — Discharge Summary (Signed)
Physician Discharge Summary  Caroline [name] [id number] DOB: 14-Jan-1951 DOA: 08/09/2016  PCP: [name], PA-C  Admit date: 08/09/2016 Discharge date: 08/13/2016  Admitted From: Home Disposition:  Home   Recommendations for Outpatient Follow-up:  1. Follow up with PCP in 1-2 weeks 2. Follow with home health 3. Patient off potassium supplements due to elevated cr and use of aldactone.   Home Health: yes   Equipment/Devices: no  Discharge Condition: Stable   CODE STATUS: DNR   Diet recommendation: Heart Healthy  Brief/Interim Summary: This is a 65 year old female who has significant history for coronary disease status post PCI in September, chronic systolic heart failure and chronic kidney disease stage III. Known to have orthostatic hypotension and frequent falls. Patient sustained a fall at home without loss of consciousness. Initial physical examination her blood pressure was 96/62, heart rate 78, temperature 97.8, respiratory rate 18, oxygen saturation 98%. She was awake and alert, her lungs had no wheezing or rales, heart S1 S2 present and rhythmic, positive systolic murmur, lower extremities no significant edema. Her sodium was 137, potassium 3.9, creatinine 2.95, BUN 35. Her chest film had increased markings bilaterally, no infiltrates. Her EKG she had a first-degree view poor R-wave progression.   Patient was admitted to the  hospital with the working diagnosis of fall related to orthostatic hypotension, contacted by acute kidney injury on chronic kidney disease.  1. Orthostatic hypotension. Patient has been placed on midodrine, she has remained asymptomatic. Her systolic blood pressure has ranged between 95 and  0000000 systolic. Physical therapy evaluation was performed with recommendation for skilled nursing facility that patient has refused, home health has been arranged.  2. Systolic heart failure. Ejection fraction 25-30% on the left ventricle. Suspected to be ischemic  cardiomyopathy. Recent revascularization. Patient not on ACE inhibitor or beta blockade due to risk of hypotension and worsening renal disease. Patient will continue taking digoxin. Recommendation to start low-dose ACE inhibitor once GFR and blood pressure stable. Patient will continue taking dual antiplatelet  therapy with aspirin and Plavix, continue statin therapy.  3. Chronic kidney disease. Stage III. The discharge creatinine is 1.41, potassium 4.4. Patient will be continued on furosemide and Aldactone for diuresis. Target and negative fluid balance. Patient not on potassium supplements due to risk of hyperkalemia. Recommend close follow-up on kidney function and likewise a small patient.  4. Anxiety/depression. Patient will continue Seroquel, Klonopin, and Prozac. Her regimen has been modified to decrease her seroquel to 400 mg at bedtime, clonazepam 1 g by mouth twice a day and discontinue Risperdal.   Discharge Diagnoses:  Principal Problem:   Bipolar 1 disorder, depressed, partial remission ([place]) Active Problems:   ANXIETY DEPRESSION   Weight loss   Cardiomyopathy, ischemic   Chronic renal disease, stage 3, moderately decreased glomerular filtration rate (GFR) between 30-59 mL/min/1.73 square meter   Coronary artery disease involving native coronary artery   CAD S/P percutaneous coronary angioplasty   Syncope   Acute kidney injury superimposed on chronic kidney disease (HCC)   Malnutrition of moderate degree    Discharge Instructions  Discharge Instructions    Diet - low sodium heart healthy    Complete by:  As directed    Discharge instructions    Complete by:  As directed    Please follow with primary care in 7days.   Increase activity slowly    Complete by:  As directed        Medication List    STOP taking these medications  risperiDONE 2 MG tablet Commonly known as:  RISPERDAL     TAKE these medications   aspirin 81 MG tablet Take 1 tablet (81 mg total) by  mouth daily.   atorvastatin 40 MG tablet Commonly known as:  LIPITOR Take 1 tablet (40 mg total) by mouth daily at 6 PM.   Black Cohosh 540 MG Caps Take 540 mg by mouth 2 (two) times daily.   clonazePAM 1 MG tablet Commonly known as:  KLONOPIN Take 1 tablet (1 mg total) by mouth 2 (two) times daily. What changed:  how much to take   clopidogrel 75 MG tablet Commonly known as:  PLAVIX Take ONE (1) tablet by mouth once daily What changed:  See the new instructions.   Digoxin 62.5 MCG Tabs Take 0.0625 mg by mouth daily.   feeding supplement (ENSURE ENLIVE) Liqd Take 237 mLs by mouth 2 (two) times daily after a meal.   FLUoxetine 10 MG capsule Commonly known as:  PROZAC Take 1 capsule (10 mg total) by mouth daily. Start taking on:  08/14/2016   furosemide 20 MG tablet Commonly known as:  LASIX Take 20 mg by mouth daily as needed for fluid or edema.   midodrine 5 MG tablet Commonly known as:  PROAMATINE Take 1 tablet (5 mg total) by mouth 3 (three) times daily with meals.   nitroGLYCERIN 0.4 MG SL tablet Commonly known as:  NITROSTAT Place 1 tablet (0.4 mg total) under the tongue every 5 (five) minutes as needed for chest pain.   QUEtiapine 400 MG tablet Commonly known as:  SEROQUEL Take 400 mg by mouth at bedtime. What changed:  Another medication with the same name was removed. Continue taking this medication, and follow the directions you see here.   spironolactone 25 MG tablet Commonly known as:  ALDACTONE Take 0.5 tablets (12.5 mg total) by mouth daily.      Follow-up Information    [facility]   Why:  They will do your home health care at your home Contact information: [place] [address] [phone]        Loralie Champagne, MD Follow up on 08/21/2016.   Specialty:  Cardiology Why:  at 140 pm for post hospital follow up. Please bring all of your medications to your visit. The code for parking is 4000. Contact  information: [place] [address] [phone]          No Known Allergies  Consultations:  Heart failure team  Psychiatry   Procedures/Studies: Dg Chest 2 View  Result Date: 08/09/2016 CLINICAL DATA:  Dizziness and fall.  Ex smoker. EXAM: CHEST  2 VIEW COMPARISON:  06/12/2015. FINDINGS: Heart size upper limits normal. Prominent coronary artery calcification with stents. No active infiltrates or failure no mediastinal masses, effusions, or pneumothorax. Previous RIGHT breast biopsy. Osteopenia. No compression fracture. Similar appearance to priors. IMPRESSION: No active cardiopulmonary disease. Electronically Signed   By: Staci Righter M.D.   On: 08/09/2016 15:53   Ct Head Wo Contrast  Result Date: 08/09/2016 CLINICAL DATA:  Increase falls and dizziness.  Hypotension. EXAM: CT HEAD WITHOUT CONTRAST CT CERVICAL SPINE WITHOUT CONTRAST TECHNIQUE: Multidetector CT imaging of the head and cervical spine was performed following the standard protocol without intravenous contrast. Multiplanar CT image reconstructions of the cervical spine were also generated. COMPARISON:  June 09, 2015 FINDINGS: CT HEAD FINDINGS Brain: No subdural, epidural, or subarachnoid hemorrhage. No mass, mass effect, or midline  shift. Ventricles and sulci are prominent but stable. Cerebellum and brainstem are normal. Basal cisterns are patent. No acute cortical ischemia or infarct. Vascular: Calcified atherosclerosis is seen in the intracranial carotid arteries and the basilar artery. Skull: Normal. Negative for fracture or focal lesion. Sinuses/Orbits: No acute finding. Other: No other abnormalities. CT CERVICAL SPINE FINDINGS Alignment: Normal. Skull base and vertebrae: Mild anterior wedging of C5 is favored to be chronic. No fracture or soft tissue swelling identified. Facet degenerative changes are seen in the upper cervical spine. No acute fractures are identified. Soft tissues and spinal  canal: No prevertebral fluid or swelling. No visible canal hematoma. Disc levels: Mild degenerative changes including the upper sixth assessed. Upper chest: Negative. Other: No other abnormalities. IMPRESSION: 1. No acute intracranial abnormality. 2. No evidence of acute fracture or traumatic malalignment. Electronically Signed   By: Dorise Bullion III M.D   On: 08/09/2016 16:54   Ct Cervical Spine Wo Contrast  Result Date: 08/09/2016 CLINICAL DATA:  Increase falls and dizziness.  Hypotension. EXAM: CT HEAD WITHOUT CONTRAST CT CERVICAL SPINE WITHOUT CONTRAST TECHNIQUE: Multidetector CT imaging of the head and cervical spine was performed following the standard protocol without intravenous contrast. Multiplanar CT image reconstructions of the cervical spine were also generated. COMPARISON:  June 09, 2015 FINDINGS: CT HEAD FINDINGS Brain: No subdural, epidural, or subarachnoid hemorrhage. No mass, mass effect, or midline shift. Ventricles and sulci are prominent but stable. Cerebellum and brainstem are normal. Basal cisterns are patent. No acute cortical ischemia or infarct. Vascular: Calcified atherosclerosis is seen in the intracranial carotid arteries and the basilar artery. Skull: Normal. Negative for fracture or focal lesion. Sinuses/Orbits: No acute finding. Other: No other abnormalities. CT CERVICAL SPINE FINDINGS Alignment: Normal. Skull base and vertebrae: Mild anterior wedging of C5 is favored to be chronic. No fracture or soft tissue swelling identified. Facet degenerative changes are seen in the upper cervical spine. No acute fractures are identified. Soft tissues and spinal canal: No prevertebral fluid or swelling. No visible canal hematoma. Disc levels: Mild degenerative changes including the upper sixth assessed. Upper chest: Negative. Other: No other abnormalities. IMPRESSION: 1. No acute intracranial abnormality. 2. No evidence of acute fracture or traumatic malalignment. Electronically Signed    By: Dorise Bullion III M.D   On: 08/09/2016 16:54       Subjective: Patient is feeling much better, denies any dyspnea, chest pain no nausea or vomiting.  Discharge Exam: Vitals:   08/13/16 0350 08/13/16 1030  BP: (!) 95/55 (!) 109/44  Pulse: 90   Resp: 18   Temp: 98.2 F (36.8 C) 98.2 F (36.8 C)   Vitals:   08/12/16 1733 08/12/16 2045 08/13/16 0350 08/13/16 1030  BP: (!) 127/48 (!) 101/56 (!) 95/55 (!) 109/44  Pulse: 92 86 90   Resp:  16 18   Temp:  99.1 F (37.3 C) 98.2 F (36.8 C) 98.2 F (36.8 C)  TempSrc:  Oral Oral Oral  SpO2:  98% 100% 100%  Weight:   54.5 kg (120 lb 1.6 oz)   Height:        General: Pt is alert, awake, not in acute distress Cardiovascular: RRR, S1/S2 +, no rubs, no gallops, Systolic murmur Respiratory: CTA bilaterally, no wheezing, no rhonchi Abdominal: Soft, NT, ND, bowel sounds + Extremities: no edema, no cyanosis    The results of significant diagnostics from this hospitalization (including imaging, microbiology, ancillary and laboratory) are listed below for reference.     Microbiology: No results  found for this or any previous visit (from the past 240 hour(s)).   Labs: BNP (last 3 results)  Recent Labs  06/18/16 1425  BNP [id number]*   Basic Metabolic Panel:  Recent Labs Lab 08/09/16 1413 08/10/16 0624 08/10/16 0944 08/11/16 0420 08/12/16 0202 08/13/16 0319  NA 137 140  --  138 138 135  K 3.1* 2.9*  --  4.0 4.4 4.4  CL 101 102  --  104 102 100*  CO2 25 27  --  22 28 26   GLUCOSE 115* 88  --  98 133* 100*  BUN 35* 26*  --  21* 20 20  CREATININE 2.95* 2.12*  --  1.78* 1.57* 1.41*  CALCIUM 9.0 8.9  --  9.0 9.7 9.8  MG  --   --  1.9  --   --   --    Liver Function Tests:  Recent Labs Lab 08/09/16 1413  AST 23  ALT 16  ALKPHOS 73  BILITOT 0.6  PROT 6.5  ALBUMIN 3.5   No results for input(s): LIPASE, AMYLASE in the last 168 hours. No results for input(s): AMMONIA in the last 168 hours. CBC:  Recent  Labs Lab 08/09/16 1413 08/10/16 0624  WBC 6.8 6.0  NEUTROABS 4.6  --   HGB 10.5* 9.2*  HCT 32.5* 29.2*  MCV 88.3 88.8  PLT 269 249   Cardiac Enzymes:  Recent Labs Lab 08/09/16 1914 08/10/16 0203 08/10/16 0624  TROPONINI 0.10* 0.09* 0.09*   BNP: Invalid input(s): POCBNP CBG:  Recent Labs Lab 08/10/16 1116 08/10/16 1640 08/11/16 0626 08/12/16 0607 08/13/16 0550  GLUCAP 168* 145* 99 96 100*   D-Dimer No results for input(s): DDIMER in the last 72 hours. Hgb A1c No results for input(s): HGBA1C in the last 72 hours. Lipid Profile No results for input(s): CHOL, HDL, LDLCALC, TRIG, CHOLHDL, LDLDIRECT in the last 72 hours. Thyroid function studies No results for input(s): TSH, T4TOTAL, T3FREE, THYROIDAB in the last 72 hours.  Invalid input(s): FREET3 Anemia work up No results for input(s): VITAMINB12, FOLATE, FERRITIN, TIBC, IRON, RETICCTPCT in the last 72 hours. Urinalysis    Component Value Date/Time   COLORURINE YELLOW 06/12/2015 2349   APPEARANCEUR CLEAR 06/12/2015 2349   LABSPEC 1.005 06/12/2015 2349   PHURINE 6.5 06/12/2015 2349   GLUCOSEU NEGATIVE 06/12/2015 2349   HGBUR NEGATIVE 06/12/2015 2349   BILIRUBINUR NEGATIVE 06/12/2015 2349   KETONESUR NEGATIVE 06/12/2015 2349   PROTEINUR NEGATIVE 06/12/2015 2349   UROBILINOGEN 0.2 06/12/2015 2349   NITRITE NEGATIVE 06/12/2015 2349   LEUKOCYTESUR NEGATIVE 06/12/2015 2349   Sepsis Labs Invalid input(s): PROCALCITONIN,  WBC,  LACTICIDVEN Microbiology No results found for this or any previous visit (from the past 240 hour(s)).   Time coordinating discharge: Over 45 minutes  SIGNED:   Tawni Millers, MD  [facility] 08/13/2016, 10:47 AM Pager   If 7PM-7AM, please contact night-coverage [email or web address] Password [email or web address]

## 2016-08-14 ENCOUNTER — Encounter (HOSPITAL_COMMUNITY): Payer: Medicare Other

## 2016-08-14 ENCOUNTER — Telehealth: Payer: Self-pay | Admitting: Behavioral Health

## 2016-08-14 ENCOUNTER — Ambulatory Visit: Payer: Medicare Other | Admitting: Family

## 2016-08-14 NOTE — Telephone Encounter (Signed)
Unable to reach patient for TCM/Hospital Follow-up call. Left message for patient to return call when available.    

## 2016-08-15 ENCOUNTER — Other Ambulatory Visit: Payer: Self-pay | Admitting: Physician Assistant

## 2016-08-15 MED ORDER — CLOPIDOGREL BISULFATE 75 MG PO TABS: ORAL_TABLET | ORAL | 0 refills | Status: DC

## 2016-08-15 MED ORDER — SPIRONOLACTONE 25 MG PO TABS: 12.5000 mg | ORAL_TABLET | Freq: Every day | ORAL | 0 refills | Status: DC

## 2016-08-15 MED ORDER — FUROSEMIDE 20 MG PO TABS: 20.0000 mg | ORAL_TABLET | Freq: Every day | ORAL | 0 refills | Status: DC | PRN

## 2016-08-15 MED ORDER — BLACK COHOSH 540 MG PO CAPS: 540.0000 mg | ORAL_CAPSULE | Freq: Two times a day (BID) | ORAL | 0 refills | Status: DC

## 2016-08-15 MED ORDER — DIGOXIN 62.5 MCG PO TABS: 0.0625 mg | ORAL_TABLET | Freq: Every day | ORAL | 0 refills | Status: DC

## 2016-08-15 MED ORDER — ATORVASTATIN CALCIUM 40 MG PO TABS: 40.0000 mg | ORAL_TABLET | Freq: Every day | ORAL | 0 refills | Status: DC

## 2016-08-15 MED ORDER — MIDODRINE HCL 5 MG PO TABS: 5.0000 mg | ORAL_TABLET | Freq: Three times a day (TID) | ORAL | 0 refills | Status: DC

## 2016-08-15 NOTE — Telephone Encounter (Signed)
Sent in requested medications as patient just discharge from hospital Only gave #30 day supply of each with 0 refills. Informed [facility] refills done and to inform patient/family to schedule appt. With provider asap.

## 2016-08-15 NOTE — Telephone Encounter (Signed)
Caller name: Estill Bamberg Relationship to patient: [place] Can be reached: [phone] Pharmacy:  [facility] [place] [address], [place] [phone] (Phone) [phone] (Fax)     Reason for call: Home Health Nurse went to patients home to evaluate and found that she was out of the following and need Refill  atorvastatin (LIPITOR) 40 MG tablet [id number]  furosemide (LASIX) 20 MG tablet [id number]  midodrine (PROAMATINE) 5 MG tablet [id number] Black Cohosh 540 MG CAPS [id number]  Plse cal [facility] nurse when they have been refilled

## 2016-08-15 NOTE — Telephone Encounter (Signed)
Left message x 2 for TCM/Hospital Follow-up.

## 2016-08-21 ENCOUNTER — Encounter (HOSPITAL_COMMUNITY): Payer: Medicare Other

## 2016-08-22 ENCOUNTER — Telehealth: Payer: Self-pay | Admitting: Physician Assistant

## 2016-08-22 NOTE — Telephone Encounter (Signed)
Caller informed, understood & agreed; pt had appointment with Psychiatry today, they will call Monday to schedule Hospital F/U, provider informed/SLS 10/20

## 2016-08-22 NOTE — Telephone Encounter (Signed)
Jessica Nurse with [facility] - [phone]   She called in to make provider aware that pt has had constipation for 2 weeks. She says that she has suggested a few different things to pt, such as cranberry juice but it isnt helping. She would like to be advised by provider.

## 2016-08-22 NOTE — Telephone Encounter (Signed)
I encourage her to increase hydration and the amount of fiber in your diet.  Start a daily probiotic (Align, Culturelle, Digestive Advantage, etc.). If no bowel movement within 24 hours, take 2 Tbs of Milk of Magnesia in a 4 oz glass of warmed prune juice every 2-3 days to help promote bowel movement. If no results within 24 hours, then repeat above regimen, adding a Dulcolax stool softener to regimen.   She needs a TCM hospital visit as well.

## 2016-08-27 ENCOUNTER — Ambulatory Visit (INDEPENDENT_AMBULATORY_CARE_PROVIDER_SITE_OTHER): Payer: Medicare Other | Admitting: Physician Assistant

## 2016-08-27 VITALS — BP 98/66 | HR 90 | Temp 98.1°F | Resp 16 | Ht 63.0 in | Wt 129.0 lb

## 2016-08-27 DIAGNOSIS — I5023 Acute on chronic systolic (congestive) heart failure: Secondary | ICD-10-CM | POA: Diagnosis not present

## 2016-08-27 DIAGNOSIS — R55 Syncope and collapse: Secondary | ICD-10-CM

## 2016-08-27 DIAGNOSIS — N183 Chronic kidney disease, stage 3 unspecified: Secondary | ICD-10-CM

## 2016-08-27 NOTE — Progress Notes (Signed)
Patient presents to clinic today for hospital follow-up. Patient admitted to hospital on 08/09/16 and discharged on 08/13/16. Patient was admitted for further assessment and management after 2 subsequent syncopal episodes. Patient noted to have acute on chronic kidney disease during admission. Patient's syncopal episodes felt to be related to anti-psychotic medication dose. Dosage changed, IV hydration given during admission. Patient stabilized and discharged home with home health RN, PT/OT. SNF was recommended but patient refused.   Since discharge, patient endorses doing well overall. Denies lightheadedness or dizziness. Is taking midodrine as directed for orthostatic hypotension. Endorses restarting her old, higher doses of Seroquel and Risperdal at the direction of her psychiatrist. States he was very upset her medications were changed in the hospital. Denies any new or worsening symptoms. Is hydrating well and endorses a well-balanced diet. Endorses therapy has come to the house and is working with her. Has not scheduled follow with Cardiology as directed on discharge summary.  Past Medical History:  Diagnosis Date  . Angina   . Anxiety   . Arthritis    "severe in my back" (07/02/2016)  . Atrial fibrillation ([place])   . Bipolar disorder ([place])   . Bright disease   . CAD (coronary artery disease)    [facility]  Diagonal instent restenosis treated 10/2005  . Cancer of right breast ([place]) 01-10-2001  . Carotid artery occlusion   . CHF (congestive heart failure) ([place])    states she was told se had this in past by MD  . Chronic back pain   . Depression    January 10, 2002 had shock treatment due to depresson, spouse passed away 01-11-00  . Dyslipidemia   . GERD (gastroesophageal reflux disease)   . Graves' disease    /notes 06/03/2010  . Heart murmur    states years ago  . Hypertension    states a long time ago-states she never took medication  . Non Q wave myocardial infarction ([place]) 03/2004   Archie Endo  06/03/2010  . Shortness of breath    with exertion    Current Outpatient Prescriptions on File Prior to Visit  Medication Sig Dispense Refill  . aspirin 81 MG tablet Take 1 tablet (81 mg total) by mouth daily.    [name] atorvastatin (LIPITOR) 40 MG tablet Take 1 tablet (40 mg total) by mouth daily at 6 PM. 30 tablet 0  . Black Cohosh 540 MG CAPS Take 1 capsule (540 mg total) by mouth 2 (two) times daily. 60 each 0  . clonazePAM (KLONOPIN) 1 MG tablet Take 1 tablet (1 mg total) by mouth 2 (two) times daily. (Patient taking differently: Take 1 mg by mouth 3 (three) times daily. ) 30 tablet 0  . clopidogrel (PLAVIX) 75 MG tablet Take ONE (1) tablet by mouth once daily 30 tablet 0  . Digoxin 62.5 MCG TABS Take 0.0625 mg by mouth daily. 30 tablet 0  . midodrine (PROAMATINE) 5 MG tablet Take 1 tablet (5 mg total) by mouth 3 (three) times daily with meals. 90 tablet 0  . nitroGLYCERIN (NITROSTAT) 0.4 MG SL tablet Place 1 tablet (0.4 mg total) under the tongue every 5 (five) minutes as needed for chest pain. 25 tablet 3  . QUEtiapine (SEROQUEL) 400 MG tablet Take 400 mg by mouth at bedtime.    [name] spironolactone (ALDACTONE) 25 MG tablet Take 0.5 tablets (12.5 mg total) by mouth daily. 15 tablet 0  . feeding supplement, ENSURE ENLIVE, (ENSURE ENLIVE) LIQD Take 237 mLs by mouth 2 (  two) times daily after a meal. (Patient not taking: Reported on 08/27/2016) 237 mL 12   No current facility-administered medications on file prior to visit.     No Known Allergies  Family History  Problem Relation Age of Onset  . Coronary artery disease Father   . Prostate cancer Father     not sure  . Pancreatic cancer Father     not sure  . Cancer Father     Pancreatic and  Prostate  . Heart disease Father     Before age 17 and BPG  . Hyperlipidemia Father   . Heart attack Father   . Hypertension Mother   . Diabetes Sister   . Colon cancer Neg Hx     Social History   Social History  . Marital status: Widowed     Spouse name: N/A  . Number of children: N/A  . Years of education: N/A   Social History Main Topics  . Smoking status: Current Some Day Smoker    Packs/day: 0.75    Years: 49.00    Types: Cigarettes    Last attempt to quit: 08/04/2015  . Smokeless tobacco: Never Used  . Alcohol use No  . Drug use: No  . Sexual activity: No   Other Topics Concern  . Not on file   Social History Narrative   She has 1 child.  She is on disability.    Review of Systems - See HPI.  All other ROS are negative.  BP 98/66 (BP Location: Right Arm, Patient Position: Sitting, Cuff Size: Normal)   Pulse 90   Temp 98.1 F (36.7 C) (Oral)   Resp 16   Ht _0  (1.6 m)   Wt 129 lb (58.5 kg)   SpO2 97%   BMI 22.85 kg/m   Physical Exam  Constitutional: She is oriented to person, place, and time and well-developed, well-nourished, and in no distress.  HENT:  Head: Normocephalic and atraumatic.  Right Ear: External ear normal.  Left Ear: External ear normal.  Nose: Nose normal.  Mouth/Throat: Oropharynx is clear and moist. No oropharyngeal exudate.  Eyes: Conjunctivae are normal. Pupils are equal, round, and reactive to light.  Neck: Neck supple.  Cardiovascular: Normal rate, regular rhythm, normal heart sounds and intact distal pulses.   Pulses:      Dorsalis pedis pulses are 2+ on the right side, and 2+ on the left side.       Posterior tibial pulses are 2+ on the right side, and 2+ on the left side.  No noted peripheral edema on examination  Pulmonary/Chest: Effort normal and breath sounds normal. No respiratory distress. She has no wheezes. She has no rales. She exhibits no tenderness.  Neurological: She is alert and oriented to person, place, and time.  Skin: Skin is warm and dry.  Psychiatric: Her mood appears not anxious. She does not exhibit a depressed mood. She has a flat affect.  Poor insight into her medical conditions  Vitals reviewed.   Recent Results (from the past 2160 hour(s))    Basic Metabolic Panel (BMET)     Status: Abnormal   Collection Time: 06/18/16  2:25 PM  Result Value Ref Range   Sodium 134 (L) 135 - 146 mmol/L   Potassium 4.5 3.5 - 5.3 mmol/L   Chloride 98 98 - 110 mmol/L   CO2 26 20 - 31 mmol/L   Glucose, Bld 84 65 - 99 mg/dL   BUN 21 7 - 25 mg/dL  Creat 2.16 (H) 0.50 - 0.99 mg/dL    Comment:   For patients > or = 63 years of age: The upper reference limit for Creatinine is approximately 13% higher for people identified as African-American.      Calcium 9.4 8.6 - 10.4 mg/dL  B Nat Peptide     Status: Abnormal   Collection Time: 06/18/16  2:25 PM  Result Value Ref Range   Brain Natriuretic Peptide 275.8 (H) <100 pg/mL    Comment:   BNP levels increase with age in the general population with the highest values seen in individuals greater than 19 years of age. Reference: Joellyn Rued Cardiol 2002; 98:921-19.     Basic Metabolic Panel (BMET)     Status: Abnormal   Collection Time: 06/25/16  2:14 PM  Result Value Ref Range   Sodium 134 (L) 135 - 146 mmol/L   Potassium 4.5 3.5 - 5.3 mmol/L   Chloride 98 98 - 110 mmol/L   CO2 25 20 - 31 mmol/L   Glucose, Bld 82 65 - 99 mg/dL   BUN 12 7 - 25 mg/dL   Creat 2.01 (H) 0.50 - 0.99 mg/dL    Comment:   For patients > or = 63 years of age: The upper reference limit for Creatinine is approximately 13% higher for people identified as African-American.      Calcium 9.4 8.6 - 10.4 mg/dL  Comprehensive metabolic panel     Status: Abnormal   Collection Time: 07/02/16  7:53 PM  Result Value Ref Range   Sodium 129 (L) 135 - 145 mmol/L   Potassium 4.7 3.5 - 5.1 mmol/L   Chloride 99 (L) 101 - 111 mmol/L   CO2 22 22 - 32 mmol/L   Glucose, Bld 86 65 - 99 mg/dL   BUN 19 6 - 20 mg/dL   Creatinine, Ser 2.12 (H) 0.44 - 1.00 mg/dL   Calcium 9.7 8.9 - 10.3 mg/dL   Total Protein 6.4 (L) 6.5 - 8.1 g/dL   Albumin 3.5 3.5 - 5.0 g/dL   AST 14 (L) 15 - 41 U/L   ALT 9 (L) 14 - 54 U/L   Alkaline Phosphatase 61  38 - 126 U/L   Total Bilirubin 0.5 0.3 - 1.2 mg/dL   GFR calc non Af Amer 24 (L) >60 mL/min   GFR calc Af Amer 27 (L) >60 mL/min    Comment: (NOTE) The eGFR has been calculated using the CKD EPI equation. This calculation has not been validated in all clinical situations. eGFR's persistently <60 mL/min signify possible Chronic Kidney Disease.    Anion gap 8 5 - 15  Magnesium     Status: None   Collection Time: 07/02/16  7:53 PM  Result Value Ref Range   Magnesium 1.8 1.7 - 2.4 mg/dL  TSH     Status: None   Collection Time: 07/02/16  7:53 PM  Result Value Ref Range   TSH 2.385 0.350 - 4.500 uIU/mL  CBC WITH DIFFERENTIAL     Status: Abnormal   Collection Time: 07/02/16  7:53 PM  Result Value Ref Range   WBC 6.7 4.0 - 10.5 K/uL   RBC 3.69 (L) 3.87 - 5.11 MIL/uL   Hemoglobin 10.5 (L) 12.0 - 15.0 g/dL   HCT 31.9 (L) 36.0 - 46.0 %   MCV 86.4 78.0 - 100.0 fL   MCH 28.5 26.0 - 34.0 pg   MCHC 32.9 30.0 - 36.0 g/dL   RDW 14.2 11.5 - 15.5 %  Platelets 262 150 - 400 K/uL   Neutrophils Relative % 62 %   Neutro Abs 4.2 1.7 - 7.7 K/uL   Lymphocytes Relative 27 %   Lymphs Abs 1.8 0.7 - 4.0 K/uL   Monocytes Relative 9 %   Monocytes Absolute 0.6 0.1 - 1.0 K/uL   Eosinophils Relative 1 %   Eosinophils Absolute 0.1 0.0 - 0.7 K/uL   Basophils Relative 1 %   Basophils Absolute 0.1 0.0 - 0.1 K/uL  Basic metabolic panel     Status: Abnormal   Collection Time: 07/03/16  3:36 AM  Result Value Ref Range   Sodium 130 (L) 135 - 145 mmol/L   Potassium 4.4 3.5 - 5.1 mmol/L   Chloride 99 (L) 101 - 111 mmol/L   CO2 24 22 - 32 mmol/L   Glucose, Bld 85 65 - 99 mg/dL   BUN 24 (H) 6 - 20 mg/dL   Creatinine, Ser 2.15 (H) 0.44 - 1.00 mg/dL   Calcium 9.5 8.9 - 10.3 mg/dL   GFR calc non Af Amer 23 (L) >60 mL/min   GFR calc Af Amer 27 (L) >60 mL/min    Comment: (NOTE) The eGFR has been calculated using the CKD EPI equation. This calculation has not been validated in all clinical situations. eGFR's  persistently <60 mL/min signify possible Chronic Kidney Disease.    Anion gap 7 5 - 15  Protime-INR     Status: None   Collection Time: 07/03/16  4:50 AM  Result Value Ref Range   Prothrombin Time 12.8 11.4 - 15.2 seconds   INR 0.96   I-STAT 3, arterial blood gas (G3+)     Status: Abnormal   Collection Time: 07/03/16  5:44 PM  Result Value Ref Range   pH, Arterial 7.301 (L) 7.350 - 7.450   pCO2 arterial 38.0 32.0 - 48.0 mmHg   pO2, Arterial 98.0 83.0 - 108.0 mmHg   Bicarbonate 18.8 (L) 20.0 - 28.0 mmol/L   TCO2 20 0 - 100 mmol/L   O2 Saturation 97.0 %   Acid-base deficit 7.0 (H) 0.0 - 2.0 mmol/L   Patient temperature HIDE    Sample type ARTERIAL   I-STAT 3, venous blood gas (G3P V)     Status: Abnormal   Collection Time: 07/03/16  5:48 PM  Result Value Ref Range   pH, Ven 7.307 7.250 - 7.430   pCO2, Ven 39.1 (L) 44.0 - 60.0 mmHg   pO2, Ven 35.0 32.0 - 45.0 mmHg   Bicarbonate 19.5 (L) 20.0 - 28.0 mmol/L   TCO2 21 0 - 100 mmol/L   O2 Saturation 62.0 %   Acid-base deficit 6.0 (H) 0.0 - 2.0 mmol/L   Patient temperature HIDE    Sample type VENOUS    Comment NOTIFIED PHYSICIAN   I-STAT 3, venous blood gas (G3P V)     Status: Abnormal   Collection Time: 07/03/16  6:02 PM  Result Value Ref Range   pH, Ven 7.298 7.250 - 7.430   pCO2, Ven 42.7 (L) 44.0 - 60.0 mmHg   pO2, Ven 36.0 32.0 - 45.0 mmHg   Bicarbonate 20.9 20.0 - 28.0 mmol/L   TCO2 22 0 - 100 mmol/L   O2 Saturation 62.0 %   Acid-base deficit 5.0 (H) 0.0 - 2.0 mmol/L   Patient temperature HIDE    Sample type VENOUS    Comment NOTIFIED PHYSICIAN   CBC     Status: Abnormal   Collection Time: 07/04/16  4:19 AM  Result  Value Ref Range   WBC 6.1 4.0 - 10.5 K/uL   RBC 3.35 (L) 3.87 - 5.11 MIL/uL   Hemoglobin 9.6 (L) 12.0 - 15.0 g/dL   HCT 29.3 (L) 36.0 - 46.0 %   MCV 87.5 78.0 - 100.0 fL   MCH 28.7 26.0 - 34.0 pg   MCHC 32.8 30.0 - 36.0 g/dL   RDW 14.5 11.5 - 15.5 %   Platelets 256 150 - 400 K/uL  Basic metabolic  panel     Status: Abnormal   Collection Time: 07/04/16  4:19 AM  Result Value Ref Range   Sodium 133 (L) 135 - 145 mmol/L   Potassium 4.2 3.5 - 5.1 mmol/L   Chloride 105 101 - 111 mmol/L   CO2 23 22 - 32 mmol/L   Glucose, Bld 88 65 - 99 mg/dL   BUN 19 6 - 20 mg/dL   Creatinine, Ser 1.73 (H) 0.44 - 1.00 mg/dL   Calcium 9.2 8.9 - 10.3 mg/dL   GFR calc non Af Amer 30 (L) >60 mL/min   GFR calc Af Amer 35 (L) >60 mL/min    Comment: (NOTE) The eGFR has been calculated using the CKD EPI equation. This calculation has not been validated in all clinical situations. eGFR's persistently <60 mL/min signify possible Chronic Kidney Disease.    Anion gap 5 5 - 15  Basic metabolic panel     Status: Abnormal   Collection Time: 07/05/16  2:40 AM  Result Value Ref Range   Sodium 133 (L) 135 - 145 mmol/L   Potassium 4.7 3.5 - 5.1 mmol/L   Chloride 103 101 - 111 mmol/L   CO2 22 22 - 32 mmol/L   Glucose, Bld 95 65 - 99 mg/dL   BUN 15 6 - 20 mg/dL   Creatinine, Ser 1.68 (H) 0.44 - 1.00 mg/dL   Calcium 9.5 8.9 - 10.3 mg/dL   GFR calc non Af Amer 31 (L) >60 mL/min   GFR calc Af Amer 36 (L) >60 mL/min    Comment: (NOTE) The eGFR has been calculated using the CKD EPI equation. This calculation has not been validated in all clinical situations. eGFR's persistently <60 mL/min signify possible Chronic Kidney Disease.    Anion gap 8 5 - 15  CBC     Status: Abnormal   Collection Time: 07/05/16  2:40 AM  Result Value Ref Range   WBC 5.3 4.0 - 10.5 K/uL   RBC 3.59 (L) 3.87 - 5.11 MIL/uL   Hemoglobin 10.2 (L) 12.0 - 15.0 g/dL   HCT 31.8 (L) 36.0 - 46.0 %   MCV 88.6 78.0 - 100.0 fL   MCH 28.4 26.0 - 34.0 pg   MCHC 32.1 30.0 - 36.0 g/dL   RDW 14.3 11.5 - 15.5 %   Platelets 267 150 - 400 K/uL  Basic metabolic panel     Status: Abnormal   Collection Time: 07/16/16 10:43 AM  Result Value Ref Range   Sodium 130 (L) 135 - 145 mmol/L   Potassium 4.2 3.5 - 5.1 mmol/L   Chloride 97 (L) 101 - 111 mmol/L     CO2 22 22 - 32 mmol/L   Glucose, Bld 102 (H) 65 - 99 mg/dL   BUN 20 6 - 20 mg/dL   Creatinine, Ser 2.01 (H) 0.44 - 1.00 mg/dL   Calcium 9.6 8.9 - 10.3 mg/dL   GFR calc non Af Amer 25 (L) >60 mL/min   GFR calc Af Amer 29 (L) >60 mL/min  Comment: (NOTE) The eGFR has been calculated using the CKD EPI equation. This calculation has not been validated in all clinical situations. eGFR's persistently <60 mL/min signify possible Chronic Kidney Disease.    Anion gap 11 5 - 15  Digoxin level     Status: None   Collection Time: 07/16/16 10:43 AM  Result Value Ref Range   Digoxin Level 1.1 0.8 - 2.0 ng/mL  Basic metabolic panel     Status: Abnormal   Collection Time: 07/22/16  9:45 AM  Result Value Ref Range   Sodium 131 (L) 135 - 145 mmol/L   Potassium 4.0 3.5 - 5.1 mmol/L   Chloride 99 (L) 101 - 111 mmol/L   CO2 22 22 - 32 mmol/L   Glucose, Bld 109 (H) 65 - 99 mg/dL   BUN 17 6 - 20 mg/dL   Creatinine, Ser 2.26 (H) 0.44 - 1.00 mg/dL   Calcium 9.9 8.9 - 10.3 mg/dL   GFR calc non Af Amer 22 (L) >60 mL/min   GFR calc Af Amer 25 (L) >60 mL/min    Comment: (NOTE) The eGFR has been calculated using the CKD EPI equation. This calculation has not been validated in all clinical situations. eGFR's persistently <60 mL/min signify possible Chronic Kidney Disease.    Anion gap 10 5 - 15  CBC     Status: Abnormal   Collection Time: 07/22/16  9:45 AM  Result Value Ref Range   WBC 9.6 4.0 - 10.5 K/uL   RBC 3.81 (L) 3.87 - 5.11 MIL/uL   Hemoglobin 10.9 (L) 12.0 - 15.0 g/dL   HCT 33.4 (L) 36.0 - 46.0 %   MCV 87.7 78.0 - 100.0 fL   MCH 28.6 26.0 - 34.0 pg   MCHC 32.6 30.0 - 36.0 g/dL   RDW 14.2 11.5 - 15.5 %   Platelets 280 150 - 400 K/uL  Protime-INR     Status: None   Collection Time: 07/22/16  9:45 AM  Result Value Ref Range   Prothrombin Time 12.9 11.4 - 15.2 seconds   INR 0.97   POCT Activated clotting time     Status: None   Collection Time: 07/22/16  3:38 PM  Result Value Ref  Range   Activated Clotting Time 252 seconds  POCT Activated clotting time     Status: None   Collection Time: 07/22/16  3:54 PM  Result Value Ref Range   Activated Clotting Time 285 seconds  POCT Activated clotting time     Status: None   Collection Time: 07/22/16  4:16 PM  Result Value Ref Range   Activated Clotting Time 241 seconds  POCT Activated clotting time     Status: None   Collection Time: 07/22/16  4:32 PM  Result Value Ref Range   Activated Clotting Time 279 seconds  POCT Activated clotting time     Status: None   Collection Time: 07/22/16  6:36 PM  Result Value Ref Range   Activated Clotting Time 202 seconds  POCT Activated clotting time     Status: None   Collection Time: 07/22/16  8:28 PM  Result Value Ref Range   Activated Clotting Time 164 seconds  Basic metabolic panel     Status: Abnormal   Collection Time: 07/23/16  3:17 AM  Result Value Ref Range   Sodium 132 (L) 135 - 145 mmol/L   Potassium 4.1 3.5 - 5.1 mmol/L   Chloride 104 101 - 111 mmol/L   CO2 22 22 - 32  mmol/L   Glucose, Bld 89 65 - 99 mg/dL   BUN 23 (H) 6 - 20 mg/dL   Creatinine, Ser 1.99 (H) 0.44 - 1.00 mg/dL   Calcium 8.9 8.9 - 10.3 mg/dL   GFR calc non Af Amer 26 (L) >60 mL/min   GFR calc Af Amer 30 (L) >60 mL/min    Comment: (NOTE) The eGFR has been calculated using the CKD EPI equation. This calculation has not been validated in all clinical situations. eGFR's persistently <60 mL/min signify possible Chronic Kidney Disease.    Anion gap 6 5 - 15  CBC     Status: Abnormal   Collection Time: 07/23/16  3:17 AM  Result Value Ref Range   WBC 4.5 4.0 - 10.5 K/uL   RBC 3.15 (L) 3.87 - 5.11 MIL/uL   Hemoglobin 9.0 (L) 12.0 - 15.0 g/dL   HCT 27.7 (L) 36.0 - 46.0 %   MCV 87.9 78.0 - 100.0 fL   MCH 28.6 26.0 - 34.0 pg   MCHC 32.5 30.0 - 36.0 g/dL   RDW 14.6 11.5 - 15.5 %   Platelets 259 150 - 400 K/uL  CBC     Status: Abnormal   Collection Time: 07/23/16  9:11 AM  Result Value Ref Range    WBC 7.4 4.0 - 10.5 K/uL   RBC 3.16 (L) 3.87 - 5.11 MIL/uL   Hemoglobin 9.0 (L) 12.0 - 15.0 g/dL   HCT 28.0 (L) 36.0 - 46.0 %   MCV 88.6 78.0 - 100.0 fL   MCH 28.5 26.0 - 34.0 pg   MCHC 32.1 30.0 - 36.0 g/dL   RDW 14.6 11.5 - 15.5 %   Platelets 250 150 - 400 K/uL  Comprehensive metabolic panel     Status: Abnormal   Collection Time: 08/09/16  2:13 PM  Result Value Ref Range   Sodium 137 135 - 145 mmol/L   Potassium 3.1 (L) 3.5 - 5.1 mmol/L   Chloride 101 101 - 111 mmol/L   CO2 25 22 - 32 mmol/L   Glucose, Bld 115 (H) 65 - 99 mg/dL   BUN 35 (H) 6 - 20 mg/dL   Creatinine, Ser 2.95 (H) 0.44 - 1.00 mg/dL   Calcium 9.0 8.9 - 10.3 mg/dL   Total Protein 6.5 6.5 - 8.1 g/dL   Albumin 3.5 3.5 - 5.0 g/dL   AST 23 15 - 41 U/L   ALT 16 14 - 54 U/L   Alkaline Phosphatase 73 38 - 126 U/L   Total Bilirubin 0.6 0.3 - 1.2 mg/dL   GFR calc non Af Amer 16 (L) >60 mL/min   GFR calc Af Amer 18 (L) >60 mL/min    Comment: (NOTE) The eGFR has been calculated using the CKD EPI equation. This calculation has not been validated in all clinical situations. eGFR's persistently <60 mL/min signify possible Chronic Kidney Disease.    Anion gap 11 5 - 15  CBC with Differential     Status: Abnormal   Collection Time: 08/09/16  2:13 PM  Result Value Ref Range   WBC 6.8 4.0 - 10.5 K/uL   RBC 3.68 (L) 3.87 - 5.11 MIL/uL   Hemoglobin 10.5 (L) 12.0 - 15.0 g/dL   HCT 32.5 (L) 36.0 - 46.0 %   MCV 88.3 78.0 - 100.0 fL   MCH 28.5 26.0 - 34.0 pg   MCHC 32.3 30.0 - 36.0 g/dL   RDW 14.5 11.5 - 15.5 %   Platelets 269 150 - 400 K/uL  Neutrophils Relative % 68 %   Neutro Abs 4.6 1.7 - 7.7 K/uL   Lymphocytes Relative 18 %   Lymphs Abs 1.2 0.7 - 4.0 K/uL   Monocytes Relative 10 %   Monocytes Absolute 0.7 0.1 - 1.0 K/uL   Eosinophils Relative 3 %   Eosinophils Absolute 0.2 0.0 - 0.7 K/uL   Basophils Relative 1 %   Basophils Absolute 0.1 0.0 - 0.1 K/uL  I-stat troponin, ED     Status: Abnormal   Collection Time:  08/09/16  3:03 PM  Result Value Ref Range   Troponin i, poc 0.13 (HH) 0.00 - 0.08 ng/mL   Comment 3            Comment: Due to the release kinetics of cTnI, a negative result within the first hours of the onset of symptoms does not rule out myocardial infarction with certainty. If myocardial infarction is still suspected, repeat the test at appropriate intervals.   Troponin I (q 6hr x 3)     Status: Abnormal   Collection Time: 08/09/16  7:14 PM  Result Value Ref Range   Troponin I 0.10 (HH) <0.03 ng/mL    Comment: CRITICAL RESULT CALLED TO, READ BACK BY AND VERIFIED WITH: MOOREARN 2015 [id number] MCCAULEG   Digoxin level     Status: Abnormal   Collection Time: 08/09/16  7:14 PM  Result Value Ref Range   Digoxin Level 0.7 (L) 0.8 - 2.0 ng/mL  Troponin I (q 6hr x 3)     Status: Abnormal   Collection Time: 08/10/16  2:03 AM  Result Value Ref Range   Troponin I 0.09 (HH) <0.03 ng/mL    Comment: CRITICAL VALUE NOTED.  VALUE IS CONSISTENT WITH PREVIOUSLY REPORTED AND CALLED VALUE.  CBC     Status: Abnormal   Collection Time: 08/10/16  6:24 AM  Result Value Ref Range   WBC 6.0 4.0 - 10.5 K/uL   RBC 3.29 (L) 3.87 - 5.11 MIL/uL   Hemoglobin 9.2 (L) 12.0 - 15.0 g/dL   HCT 29.2 (L) 36.0 - 46.0 %   MCV 88.8 78.0 - 100.0 fL   MCH 28.0 26.0 - 34.0 pg   MCHC 31.5 30.0 - 36.0 g/dL   RDW 14.5 11.5 - 15.5 %   Platelets 249 150 - 400 K/uL  Basic metabolic panel     Status: Abnormal   Collection Time: 08/10/16  6:24 AM  Result Value Ref Range   Sodium 140 135 - 145 mmol/L   Potassium 2.9 (L) 3.5 - 5.1 mmol/L   Chloride 102 101 - 111 mmol/L   CO2 27 22 - 32 mmol/L   Glucose, Bld 88 65 - 99 mg/dL   BUN 26 (H) 6 - 20 mg/dL   Creatinine, Ser 2.12 (H) 0.44 - 1.00 mg/dL   Calcium 8.9 8.9 - 10.3 mg/dL   GFR calc non Af Amer 24 (L) >60 mL/min   GFR calc Af Amer 27 (L) >60 mL/min    Comment: (NOTE) The eGFR has been calculated using the CKD EPI equation. This calculation has not been validated  in all clinical situations. eGFR's persistently <60 mL/min signify possible Chronic Kidney Disease.    Anion gap 11 5 - 15  Troponin I (q 6hr x 3)     Status: Abnormal   Collection Time: 08/10/16  6:24 AM  Result Value Ref Range   Troponin I 0.09 (HH) <0.03 ng/mL    Comment: CRITICAL VALUE NOTED.  VALUE IS CONSISTENT WITH  PREVIOUSLY REPORTED AND CALLED VALUE.  Glucose, capillary     Status: None   Collection Time: 08/10/16  7:31 AM  Result Value Ref Range   Glucose-Capillary 88 65 - 99 mg/dL  Digoxin level     Status: Abnormal   Collection Time: 08/10/16  9:44 AM  Result Value Ref Range   Digoxin Level 0.7 (L) 0.8 - 2.0 ng/mL  Magnesium     Status: None   Collection Time: 08/10/16  9:44 AM  Result Value Ref Range   Magnesium 1.9 1.7 - 2.4 mg/dL  Glucose, capillary     Status: Abnormal   Collection Time: 08/10/16 11:16 AM  Result Value Ref Range   Glucose-Capillary 168 (H) 65 - 99 mg/dL   Comment 1 Notify RN   Glucose, capillary     Status: Abnormal   Collection Time: 08/10/16  4:40 PM  Result Value Ref Range   Glucose-Capillary 145 (H) 65 - 99 mg/dL  Basic metabolic panel     Status: Abnormal   Collection Time: 08/11/16  4:20 AM  Result Value Ref Range   Sodium 138 135 - 145 mmol/L   Potassium 4.0 3.5 - 5.1 mmol/L    Comment: DELTA CHECK NOTED NO VISIBLE HEMOLYSIS    Chloride 104 101 - 111 mmol/L   CO2 22 22 - 32 mmol/L   Glucose, Bld 98 65 - 99 mg/dL   BUN 21 (H) 6 - 20 mg/dL   Creatinine, Ser 1.78 (H) 0.44 - 1.00 mg/dL   Calcium 9.0 8.9 - 10.3 mg/dL   GFR calc non Af Amer 29 (L) >60 mL/min   GFR calc Af Amer 34 (L) >60 mL/min    Comment: (NOTE) The eGFR has been calculated using the CKD EPI equation. This calculation has not been validated in all clinical situations. eGFR's persistently <60 mL/min signify possible Chronic Kidney Disease.    Anion gap 12 5 - 15  Glucose, capillary     Status: None   Collection Time: 08/11/16  6:26 AM  Result Value Ref Range    Glucose-Capillary 99 65 - 99 mg/dL   Comment 1 Notify RN    Comment 2 Document in Chart   Basic metabolic panel     Status: Abnormal   Collection Time: 08/12/16  2:02 AM  Result Value Ref Range   Sodium 138 135 - 145 mmol/L   Potassium 4.4 3.5 - 5.1 mmol/L   Chloride 102 101 - 111 mmol/L   CO2 28 22 - 32 mmol/L   Glucose, Bld 133 (H) 65 - 99 mg/dL   BUN 20 6 - 20 mg/dL   Creatinine, Ser 1.57 (H) 0.44 - 1.00 mg/dL   Calcium 9.7 8.9 - 10.3 mg/dL   GFR calc non Af Amer 34 (L) >60 mL/min   GFR calc Af Amer 39 (L) >60 mL/min    Comment: (NOTE) The eGFR has been calculated using the CKD EPI equation. This calculation has not been validated in all clinical situations. eGFR's persistently <60 mL/min signify possible Chronic Kidney Disease.    Anion gap 8 5 - 15  Glucose, capillary     Status: None   Collection Time: 08/12/16  6:07 AM  Result Value Ref Range   Glucose-Capillary 96 65 - 99 mg/dL   Comment 1 Notify RN    Comment 2 Document in Chart   Basic metabolic panel     Status: Abnormal   Collection Time: 08/13/16  3:19 AM  Result Value Ref Range  Sodium 135 135 - 145 mmol/L   Potassium 4.4 3.5 - 5.1 mmol/L   Chloride 100 (L) 101 - 111 mmol/L   CO2 26 22 - 32 mmol/L   Glucose, Bld 100 (H) 65 - 99 mg/dL   BUN 20 6 - 20 mg/dL   Creatinine, Ser 1.41 (H) 0.44 - 1.00 mg/dL   Calcium 9.8 8.9 - 10.3 mg/dL   GFR calc non Af Amer 39 (L) >60 mL/min   GFR calc Af Amer 45 (L) >60 mL/min    Comment: (NOTE) The eGFR has been calculated using the CKD EPI equation. This calculation has not been validated in all clinical situations. eGFR's persistently <60 mL/min signify possible Chronic Kidney Disease.    Anion gap 9 5 - 15  Glucose, capillary     Status: Abnormal   Collection Time: 08/13/16  5:50 AM  Result Value Ref Range   Glucose-Capillary 100 (H) 65 - 99 mg/dL  Glucose, capillary     Status: Abnormal   Collection Time: 08/13/16 11:44 AM  Result Value Ref Range    Glucose-Capillary 108 (H) 65 - 99 mg/dL  Basic metabolic panel     Status: Abnormal   Collection Time: 08/27/16  4:09 PM  Result Value Ref Range   Sodium 125 (L) 135 - 145 mEq/L   Potassium 4.2 3.5 - 5.1 mEq/L   Chloride 94 (L) 96 - 112 mEq/L   CO2 21 19 - 32 mEq/L   Glucose, Bld 111 (H) 70 - 99 mg/dL   BUN 18 6 - 23 mg/dL   Creatinine, Ser 1.49 (H) 0.40 - 1.20 mg/dL   Calcium 9.2 8.4 - 10.5 mg/dL   GFR 37.53 (L) >60.00 mL/min    Assessment/Plan: 1. Chronic renal disease, stage 3, moderately decreased glomerular filtration rate (GFR) between 30-59 mL/min/1.73 square meter Remaining off of potassium supplement due to use of Aldactone. Will repeat BMP. Patient to follow-up with Nephrology as scheduled. - Basic metabolic panel  2. Syncope, unspecified syncope type Secondary to Orthostatic Hypotension which is a continued issue for patient. Many providers including Cardiology, Hospitalist an myself feel that dosage of her anti-pychotics, especially the Risperdal, as well as polypharmacy with these medications are the main driving force of orthostatic hypotension. Patient refuses to believe this. Psychiatry keeps re-upping her medication dosages despite recurrent issue. BP low today but patient denies symptoms. Continue midodrine, restart use of compression stocking daily. Patient to add on Gatorade daily to help with salt intake. Patient to call and schedule FU with Cardiology.  3. Acute on chronic systolic congestive heart failure ([place]) Patient euvolemic at present. She is to call and schedule follow-up with Cardiology. Continue medications as directed. Alarm signs/symptoms that would indicate exacerbation reviewed with patient.    [name], PA-C

## 2016-08-27 NOTE — Patient Instructions (Addendum)
Please go to the lab for blood work. I will call you with your results.  Please continue all medications as directed. Follow-up with your Cardiologist and Psychiatrist as directed. Again I feel your psychiatric medications contribute to symptoms, especially the Risperdal. Would recommend discussing an alternative with your specialist. I will try to contact them to discuss this as well.  Follow-up will be based on lab results.

## 2016-08-27 NOTE — Progress Notes (Signed)
Pre visit review using our clinic review tool, if applicable. No additional management support is needed unless otherwise documented below in the visit note/SLS  

## 2016-08-28 LAB — BASIC METABOLIC PANEL
BUN: 18 mg/dL (ref 6–23)
CALCIUM: 9.2 mg/dL (ref 8.4–10.5)
CO2: 21 mEq/L (ref 19–32)
CREATININE: 1.49 mg/dL — AB (ref 0.40–1.20)
Chloride: 94 mEq/L — ABNORMAL LOW (ref 96–112)
GFR: 37.53 mL/min — AB (ref >60.00)
Glucose, Bld: 111 mg/dL — ABNORMAL HIGH (ref 70–99)
Potassium: 4.2 mEq/L (ref 3.5–5.1)
Sodium: 125 mEq/L — ABNORMAL LOW (ref 135–145)

## 2016-09-03 ENCOUNTER — Telehealth: Payer: Self-pay | Admitting: Physician Assistant

## 2016-09-03 NOTE — Telephone Encounter (Signed)
[facility] called to let us know that patient was unavailable for a home nurse visit today. She was not home when then went by [place]: [phone]

## 2016-09-07 ENCOUNTER — Encounter: Payer: Self-pay | Admitting: Physician Assistant

## 2016-09-12 ENCOUNTER — Encounter (HOSPITAL_COMMUNITY): Payer: Medicare Other

## 2016-09-15 ENCOUNTER — Telehealth: Payer: Self-pay | Admitting: *Deleted

## 2016-09-15 DIAGNOSIS — E876 Hypokalemia: Secondary | ICD-10-CM

## 2016-09-15 NOTE — Telephone Encounter (Signed)
Patient informed, understood & agreed; has her appt set with kidney specialist, will call back to schedule lab appointment, future order placed/SLS 11/13    Notes Recorded by Rockwell Germany, CMA on 09/10/2016 at 8:19 AM EST [facility] with contact name and number for return call RE: results and further provider instructions/SLS 11/08 ------ Notes Recorded by Magdalene Molly, RMA on 08/28/2016 at 5:14 PM EDT Left message for pt to call the office back

## 2016-09-15 NOTE — Telephone Encounter (Signed)
-----   Message from Brunetta Jeans, PA-C sent at 08/28/2016  3:19 PM EDT ----- Renal function stable overall. She needs to schedule FU with her specialist. Sodium is decently low. She needs to add on a gatorade daily and increase odium in her diet. Repeat BMP in 1 week.

## 2016-09-22 ENCOUNTER — Other Ambulatory Visit: Payer: Self-pay | Admitting: Physician Assistant

## 2016-09-24 ENCOUNTER — Other Ambulatory Visit: Payer: Self-pay | Admitting: Physician Assistant

## 2016-09-28 NOTE — Progress Notes (Deleted)
PCP: Elyn Aquas Cardiology: Dr. Aundra Dubin  65 y.o.  with history of severe depression, CKD stage III, carotid stenosis, CAD, and chronic systolic CHF presents for cardiology followup.  She recently had right and left heart cath (8/17), showing optimized LV and RV filling pressures and relatively preserved cardiac output.  She had severe stenoses in a large OM1 and a large D2.  Staged intervention by Dr Ellyn Hack on both vessels done 07/22/16   She subsequently had a TEE in 8/17 showed EF 25-30% with severe functional MR.    Her functional capacity appears fairly poor.  She lives alone.  She is limited by back pain and also, as noted, has severe depression with multiple psychotropic meds. Orthostatic hypotension has limited our ability to get her on an ideal cardiac regimen.  She is only taking digoxin at low dose.  Currently, denies orthostatic lightheadedness.  She is short of breath walking up steps or walking longer distances.  No orthopnea/PND.  No chest pain.  Not getting lightheaded spells currently.  Seen by [facility] but no decision rendered on AICD    ECG: NSR, LAFB, 1st degree AV block, narrow QRS  Labs (8/17): TSH normal Labs (9/17): Na 133, K 4.7, creatinine 1.68, hgb 10.2  PMH: 1. CKD: Stage III, has Bright's disease.  2. Chronic hyponatremia 3. Carotid stenosis: RICA totally occluded, s/p left CEA in 2013.  4. Depression 5. CAD: h/o PCI to diagonal with later in-stent restenosis and POBA to diagonal in 2011.   - LHC (8/17) with 80% OM1 stenosis, 95% D2 stenosis.   6. H/o orthostatic hypotension: Worked up for adrenal insufficiency by endocrinology, thought to be more likely due to psychotropic meds.  7. Chronic low back pain with spondylolisthesis.  8. 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  Cardiomyopathy seems out of proportion to coronary disease.  - RHC (8/17): mean RA 4, PA 32.9, mean PCWP 10, CI 2.7 Fick, 2.08 thermodilution.  - TEE (8/17): EF  25-30%, moderately dilated LV, RV normal size and systolic function, severe central mitral regurgitation (suspect functional). 9. Mitral regurgitation: Severe, likely functional.   SH: Lives alone in [place].  Nonsmoker, no ETOH.    Family History  Problem Relation Age of Onset  . Coronary artery disease Father   . Prostate cancer Father     not sure  . Pancreatic cancer Father     not sure  . Cancer Father     Pancreatic and  Prostate  . Heart disease Father     Before age 11 and BPG  . Hyperlipidemia Father   . Heart attack Father   . Hypertension Mother   . Diabetes Sister   . Colon cancer Neg Hx    ROS: All systems reviewed and negative except as per HPI.   Current Outpatient Prescriptions  Medication Sig Dispense Refill  . aspirin 81 MG tablet Take 1 tablet (81 mg total) by mouth daily.    [name] atorvastatin (LIPITOR) 40 MG tablet Take 1 tablet (40 mg total) by mouth daily at 6 PM. 30 tablet 0  . Black Cohosh 540 MG CAPS Take 1 capsule (540 mg total) by mouth 2 (two) times daily. 60 each 0  . clonazePAM (KLONOPIN) 1 MG tablet Take 1 tablet (1 mg total) by mouth 2 (two) times daily. (Patient taking differently: Take 1 mg by mouth 3 (three) times daily. ) 30 tablet 0  . clopidogrel (PLAVIX) 75 MG tablet Take ONE (1) tablet by mouth once daily 30  tablet 0  . DIGOX 125 MCG tablet TAKE ONE-HALF (1/2) TABLET BY MOUTH DAILY 30 tablet 3  . Digoxin 62.5 MCG TABS Take 0.0625 mg by mouth daily. 30 tablet 0  . feeding supplement, ENSURE ENLIVE, (ENSURE ENLIVE) LIQD Take 237 mLs by mouth 2 (two) times daily after a meal. (Patient not taking: Reported on 08/27/2016) 237 mL 12  . midodrine (PROAMATINE) 5 MG tablet Take 1 tablet (5 mg total) by mouth 3 (three) times daily with meals. 90 tablet 0  . nitroGLYCERIN (NITROSTAT) 0.4 MG SL tablet Place 1 tablet (0.4 mg total) under the tongue every 5 (five) minutes as needed for chest pain. 25 tablet 3  . QUEtiapine (SEROQUEL) 300 MG tablet Take 300  mg by mouth 2 (two) times daily.    . QUEtiapine (SEROQUEL) 400 MG tablet Take 400 mg by mouth at bedtime.    . risperiDONE (RISPERDAL) 2 MG tablet Take 2 mg by mouth 3 (three) times daily.    [name] spironolactone (ALDACTONE) 25 MG tablet Take 0.5 tablets (12.5 mg total) by mouth daily. 15 tablet 0   No current facility-administered medications for this visit.    There were no vitals taken for this visit. General: NAD Neck: No JVD, no thyromegaly or thyroid nodule.  Lungs: Clear to auscultation bilaterally with normal respiratory effort. CV: Nondisplaced PMI.  Heart regular S1/S2, no S3/S4, 2/6 HSM at apex.  No peripheral edema.  No carotid bruit.  Normal pedal pulses.  Abdomen: Soft, nontender, no hepatosplenomegaly, no distention.  Skin: Intact without lesions or rashes.  Neurologic: Alert and oriented x 3.  Psych: Normal affect. Extremities: No clubbing or cyanosis.  HEENT: Normal.   Assessment/Plan: 1. Chronic systolic CHF: TEE (0000000) with EF 25-30%, diffuse hypokinesis.  Suspect mixed ischemic/nonischemic cardiomyopathy.  She has had trouble tolerating cardiac meds due to orthostatic hypotension.  She does not appear volume overloaded on exam, and recent RHC showed normal right and left heart filling pressures with relatively preserved cardiac output.  - Continue digoxin 0.0625 mg daily, check level today.  - Low dose beta blocker t.   - She does not appear to need a diuretic at this time.   volume overload, dyspnea may be her anginal equivalent.   2. CAD: Post DES to OM/D1 07/22/16 continue DAT.  - Continue ASA, Plavix, and atorvastatin.   3. CKD: Stage III.   Lab Results  Component Value Date   CREATININE 1.49 (H) 08/27/2016   BUN 18 08/27/2016   NA 125 (L) 08/27/2016   K 4.2 08/27/2016   CL 94 (L) 08/27/2016   CO2 21 08/27/2016    4. Carotid stenosis: RICA occluded, has had left CEA.  5. MItral regurgitation: Severe functional MR.  Benefit to repair is questionable.   Mitraclip would be consideration but surgical repair likely would be of prohibitive risk (and questionable benefit).   6. Poor functional status: Appears to be from combination of depression and low back pain.  Jenkins Rouge 09/28/2016

## 2016-09-30 ENCOUNTER — Telehealth: Payer: Self-pay | Admitting: Physician Assistant

## 2016-09-30 NOTE — Telephone Encounter (Signed)
Patient called today stating she had a message left on the voicemail about lab results. Patient states she was not advised of lab results from her visit on 08/27/16. Advised patient her renal studies were stable but her sodium level was very low. Advised to add gattorade and increase sodium in her diet. She states she has added sodium to her diet. Scheduled patient for a lab appointment for recheck of sodium level. She is agreeable.

## 2016-10-01 ENCOUNTER — Ambulatory Visit: Payer: Medicare Other | Admitting: Cardiovascular Disease

## 2016-10-08 ENCOUNTER — Encounter: Payer: Self-pay | Admitting: Family

## 2016-10-14 ENCOUNTER — Other Ambulatory Visit: Payer: Self-pay

## 2016-10-14 ENCOUNTER — Telehealth: Payer: Self-pay | Admitting: Physician Assistant

## 2016-10-14 NOTE — Telephone Encounter (Signed)
LMOVM advising patient she can get labs completed at [facility] location. The bmp order is still active in her chart.

## 2016-10-14 NOTE — Telephone Encounter (Signed)
Patient states she has a cardiologist appointment 10/15/16 and would prefer to have labs conducted at [facility] in [place], please advise

## 2016-10-16 ENCOUNTER — Ambulatory Visit (INDEPENDENT_AMBULATORY_CARE_PROVIDER_SITE_OTHER): Payer: Medicare Other | Admitting: Family

## 2016-10-16 ENCOUNTER — Ambulatory Visit (HOSPITAL_COMMUNITY)
Admission: RE | Admit: 2016-10-16 | Discharge: 2016-10-16 | Disposition: A | Discharge status: Home / Self Care | Payer: Medicare Other | Source: Ambulatory Visit | Attending: Family | Admitting: Family

## 2016-10-16 ENCOUNTER — Encounter: Payer: Self-pay | Admitting: Family

## 2016-10-16 VITALS — BP 98/64 | HR 92 | Temp 98.0°F | Resp 18 | Wt 120.0 lb

## 2016-10-16 DIAGNOSIS — Z87891 Personal history of nicotine dependence: Secondary | ICD-10-CM

## 2016-10-16 DIAGNOSIS — I6522 Occlusion and stenosis of left carotid artery: Secondary | ICD-10-CM

## 2016-10-16 DIAGNOSIS — I6521 Occlusion and stenosis of right carotid artery: Secondary | ICD-10-CM

## 2016-10-16 DIAGNOSIS — Z9889 Other specified postprocedural states: Secondary | ICD-10-CM

## 2016-10-16 NOTE — Patient Instructions (Addendum)
Stroke Prevention Some medical conditions and behaviors are associated with an increased chance of having a stroke. You may prevent a stroke by making healthy choices and managing medical conditions. How can I reduce my risk of having a stroke?  Stay physically active. Get at least 30 minutes of activity on most or all days.  Do not smoke. It may also be helpful to avoid exposure to secondhand smoke.  Limit alcohol use. Moderate alcohol use is considered to be:  No more than 2 drinks per day for men.  No more than 1 drink per day for nonpregnant women.  Eat healthy foods. This involves:  Eating 5 or more servings of fruits and vegetables a day.  Making dietary changes that address high blood pressure (hypertension), high cholesterol, diabetes, or obesity.  Manage your cholesterol levels.  Making food choices that are high in fiber and low in saturated fat, trans fat, and cholesterol may control cholesterol levels.  Take any prescribed medicines to control cholesterol as directed by your health care provider.  Manage your diabetes.  Controlling your carbohydrate and sugar intake is recommended to manage diabetes.  Take any prescribed medicines to control diabetes as directed by your health care provider.  Control your hypertension.  Making food choices that are low in salt (sodium), saturated fat, trans fat, and cholesterol is recommended to manage hypertension.  Ask your health care provider if you need treatment to lower your blood pressure. Take any prescribed medicines to control hypertension as directed by your health care provider.  If you are 18-39 years of age, have your blood pressure checked every 3-5 years. If you are 40 years of age or older, have your blood pressure checked every year.  Maintain a healthy weight.  Reducing calorie intake and making food choices that are low in sodium, saturated fat, trans fat, and cholesterol are recommended to manage  weight.  Stop drug abuse.  Avoid taking birth control pills.  Talk to your health care provider about the risks of taking birth control pills if you are over 35 years old, smoke, get migraines, or have ever had a blood clot.  Get evaluated for sleep disorders (sleep apnea).  Talk to your health care provider about getting a sleep evaluation if you snore a lot or have excessive sleepiness.  Take medicines only as directed by your health care provider.  For some people, aspirin or blood thinners (anticoagulants) are helpful in reducing the risk of forming abnormal blood clots that can lead to stroke. If you have the irregular heart rhythm of atrial fibrillation, you should be on a blood thinner unless there is a good reason you cannot take them.  Understand all your medicine instructions.  Make sure that other conditions (such as anemia or atherosclerosis) are addressed. Get help right away if:  You have sudden weakness or numbness of the face, arm, or leg, especially on one side of the body.  Your face or eyelid droops to one side.  You have sudden confusion.  You have trouble speaking (aphasia) or understanding.  You have sudden trouble seeing in one or both eyes.  You have sudden trouble walking.  You have dizziness.  You have a loss of balance or coordination.  You have a sudden, severe headache with no known cause.  You have new chest pain or an irregular heartbeat. Any of these symptoms may represent a serious problem that is an emergency. Do not wait to see if the symptoms will go away.   Get medical help at once. Call your local emergency services (911 in [place] Do not drive yourself to the hospital. This information is not intended to replace advice given to you by your health care provider. Make sure you discuss any questions you have with your health care provider. Document Released: 11/27/2004 Document Revised: 03/27/2016 Document Reviewed: 04/22/2013 Elsevier  Interactive Patient Education  2017 [employer]  

## 2016-10-16 NOTE — Progress Notes (Signed)
Chief Complaint: Follow up Extracranial Carotid Artery Stenosis   History of Present Illness  Caroline [name] is a 65 y.o. female patient of Dr. Kellie Simmering who has a known right ICA occlusion and is s/p left carotid endarterectomy performed in 02/27/12 for severe left ICA stenosis. She returns today for follow up.  She reports in the early 2000's that she lost the top half of the vision in her right eye, this has remained. Pt states has not seen an ophthalmologist since 02/27/1996, no reported eye problems at that time.  She denies any known history of stroke or TIA. Specifically she denies a history of unilateral facial drooping, or hemiplegia. But she did have amaurosis fugax in her right eye as mentioned above, and does describe slurred speech in October 2017. She states she had sudden onset slurred speech in October 2017, she then fell, hit her head, states she was evaluated at [facility] for this, states no abnormality found on CT of head. But states after she hit her head, her speech cleared.   She denies claudication symptoms in her legs with walking, denies non healing wounds.  She fell August or September 2016, states she fractured her right pelvis, and attended rehab for a while.  She had 2 cardiac stents placed in September 2017, states she needs to have her "leaky heart valve" fixed in [place] at some point.   Pt Diabetic: No Pt smoker: former smoker, quit in October 2016, started at age 89 years  Pt meds include: Statin : yes ASA: Yes Other anticoagulants/antiplatelets: Plavix   Past Medical History:  Diagnosis Date  . Angina   . Anxiety   . Arthritis    "severe in my back" (07/02/2016)  . Atrial fibrillation ([place])   . Bipolar disorder ([place])   . Bright disease   . CAD (coronary artery disease)    [facility]  Diagonal instent restenosis treated 10/2005  . Cancer of right breast ([place]) 26-Feb-2001  . Carotid artery occlusion   . CHF (congestive heart failure) ([place])    states  she was told se had this in past by MD  . Chronic back pain   . Depression    2002-02-26 had shock treatment due to depresson, spouse passed away February 27, 2000  . Dyslipidemia   . GERD (gastroesophageal reflux disease)   . Graves' disease    /notes 06/03/2010  . Heart murmur    states years ago  . Hypertension    states a long time ago-states she never took medication  . Non Q wave myocardial infarction ([place]) 03/2004   Archie Endo 06/03/2010  . Shortness of breath    with exertion    Social History Social History  Substance Use Topics  . Smoking status: Former Smoker    Packs/day: 0.75    Years: 49.00    Types: Cigarettes    Quit date: 08/04/2015  . Smokeless tobacco: Never Used  . Alcohol use No    Family History Family History  Problem Relation Age of Onset  . Coronary artery disease Father   . Prostate cancer Father     not sure  . Pancreatic cancer Father     not sure  . Cancer Father     Pancreatic and  Prostate  . Heart disease Father     Before age 17 and BPG  . Hyperlipidemia Father   . Heart attack Father   . Hypertension Mother   . Diabetes Sister   . Colon  cancer Neg Hx     Surgical History Past Surgical History:  Procedure Laterality Date  . BREAST LUMPECTOMY Right 2002  . CARDIAC CATHETERIZATION N/A 07/03/2016   Procedure: Right/Left Heart Cath and Coronary Angiography;  Surgeon: Leonie Man, MD;  Location: [place] [facility];  Service: Cardiovascular;  Laterality: N/A;  . CARDIAC CATHETERIZATION N/A 07/22/2016   Procedure: Coronary Stent Intervention;  Surgeon: Leonie Man, MD;  Location: [place] [facility];  Service: Cardiovascular;  Laterality: N/A;  . CAROTID ENDARTERECTOMY  01/08/12   Left  . CORONARY ANGIOPLASTY  03/2009   Archie Endo 06/03/2010  . CORONARY ANGIOPLASTY WITH STENT PLACEMENT  03/2004   Archie Endo 06/03/2010;  . CORONARY ANGIOPLASTY WITH STENT PLACEMENT      "I've got 3 stent in there" (07/02/2016)  . CORONARY STENT PLACEMENT  07/23/2016   STENT PROMUS  PREM MR 2.5X12 drug eluting stent was successfully placed, and overlaps previously placed stent.  [name] ENDARTERECTOMY  01/08/2012   Procedure: ENDARTERECTOMY CAROTID;  Surgeon: Mal Misty, MD;  Location: [facility];  Service: Vascular;  Laterality: Left;  with Dacron patch angioplasty  . FRACTURE SURGERY  2007   Broken Back  . JOINT REPLACEMENT    . MULTIPLE TOOTH EXTRACTIONS  2016   "18 or 19"  . TEE WITHOUT CARDIOVERSION N/A 07/04/2016   Procedure: TRANSESOPHAGEAL ECHOCARDIOGRAM (TEE);  Surgeon: Larey Dresser, MD;  Location: [facility];  Service: Cardiovascular;  Laterality: N/A;  . TOTAL HIP ARTHROPLASTY Left 2011  . VAGINAL HYSTERECTOMY  1970s   partial    No Known Allergies  Current Outpatient Prescriptions  Medication Sig Dispense Refill  . aspirin 81 MG tablet Take 1 tablet (81 mg total) by mouth daily.    [name] atorvastatin (LIPITOR) 40 MG tablet Take 1 tablet (40 mg total) by mouth daily at 6 PM. 30 tablet 0  . Black Cohosh 540 MG CAPS Take 1 capsule (540 mg total) by mouth 2 (two) times daily. 60 each 0  . clonazePAM (KLONOPIN) 1 MG tablet Take 1 tablet (1 mg total) by mouth 2 (two) times daily. (Patient taking differently: Take 1 mg by mouth 3 (three) times daily. ) 30 tablet 0  . clopidogrel (PLAVIX) 75 MG tablet Take ONE (1) tablet by mouth once daily 30 tablet 0  . DIGOX 125 MCG tablet TAKE ONE-HALF (1/2) TABLET BY MOUTH DAILY 30 tablet 3  . Digoxin 62.5 MCG TABS Take 0.0625 mg by mouth daily. 30 tablet 0  . feeding supplement, ENSURE ENLIVE, (ENSURE ENLIVE) LIQD Take 237 mLs by mouth 2 (two) times daily after a meal. 237 mL 12  . midodrine (PROAMATINE) 5 MG tablet Take 1 tablet (5 mg total) by mouth 3 (three) times daily with meals. 90 tablet 0  . nitroGLYCERIN (NITROSTAT) 0.4 MG SL tablet Place 1 tablet (0.4 mg total) under the tongue every 5 (five) minutes as needed for chest pain. 25 tablet 3  . QUEtiapine (SEROQUEL) 300 MG tablet Take 300 mg by mouth 2 (two) times daily.    .  QUEtiapine (SEROQUEL) 400 MG tablet Take 400 mg by mouth at bedtime.    . risperiDONE (RISPERDAL) 2 MG tablet Take 2 mg by mouth 3 (three) times daily.    [name] spironolactone (ALDACTONE) 25 MG tablet Take 0.5 tablets (12.5 mg total) by mouth daily. 15 tablet 0   No current facility-administered medications for this visit.     Review of Systems : See HPI for pertinent positives and negatives.  Physical Examination  Vitals:   10/16/16 1452 10/16/16 1458  BP: 108/64 98/64  Pulse: 79 92  Resp: 18   Temp: 98 F (36.7 C)   SpO2: 100%   Weight: 120 lb (54.4 kg)    Body mass index is 21.26 kg/m.     General: WDWN female in NAD GAIT: normal Eyes: PERRLA, right eyelid with mild to moderate ptosis Pulmonary:  Respirations are non-labored, good air movement, CTAB  Cardiac: regular rhythm, +murmur.  VASCULAR EXAM Carotid Bruits Right Left   Positive Negative    Aorta is not palpable. Radial pulses: right is 1+ palpable, left is 2+ palpable.                                                                                                                           LE Pulses Right Left       POPLITEAL  not palpable   not palpable       POSTERIOR TIBIAL   palpable    palpable        DORSALIS PEDIS      ANTERIOR TIBIAL  palpable   palpable     Gastrointestinal: soft, nontender, BS WNL, no r/g, no palpable masses.  Musculoskeletal: No muscle atrophy/wasting. M/S 5/5 in lower extremities, 4/5 in upper extremities, extremities without ischemic changes.  Neurologic: A&O X 3; flat Affect, Speech is normal, CN 2-12 intact except tongue protrusion on command is to the right, pain and light touch intact in extremities, Motor exam as listed above.     Assessment: Caroline [name] is a 65 y.o. female who has a known right ICA occlusion and is s/p left carotid endarterectomy performed in March of 2013 for severe left ICA stenosis. She had amaurosis fugax in the early 2000's, lost the top  half of the vision in her right eye, this has remained. Pt states has not seen an ophthalmologist since 1997, no reported eye problems at that time.  She also had an episode of slurred speech in October 2017, prior to hitting her head with falling, speech cleared after she hit her head, according to the history pt reports.  She denies any episodes of hemiparesis.   She quit smoking in October 2016 and I congratulated her re this, I encouraged her to stay tobacco free.  Her atherosclerotic risk factors include former smoker x 49 years, CAD, and history of breast cancer.   DATA Today's carotid duplex suggests known occlusion of the right ICA. Left CEA site with evidence of mild hyperplasia at the proximal patch, velocities and 1-39%. Right vertebral artery flow is antegrade/abnormal, right subclavian artery waveforms are biphasic. Left vertebral artery flow is antegrade/normal. Left subclavian artery waveforms are triphasic.  No significant change since exam at [facility] non invasive vascular lab on 05-29-15.   Plan:  I advised pt to make an appointment with her ophthalmologist as soon as possible since she has not been evaluated since 1997, and since then  developed what sounds like amaurosis fugax in her right eye. If she is unable to locate the one she saw in 1997, I advised her to call us or her PCP for a referral.   Follow-up in 1 year with Carotid Duplex scan.   I discussed in depth with the patient the nature of atherosclerosis, and emphasized the importance of maximal medical management including strict control of blood pressure, blood glucose, and lipid levels, obtaining regular exercise, and continued cessation of smoking.  The patient is aware that without maximal medical management the underlying atherosclerotic disease process will progress, limiting the benefit of any interventions. The patient was given information about stroke prevention and what symptoms should prompt the  patient to seek immediate medical care. Thank you for allowing [place] to participate in this patient's care.  Clemon Chambers, RN, MSN, FNP-C [facility] Office: [phone]  Clinic Physician: Bridgett Larsson  10/16/16 3:37 PM

## 2016-10-17 ENCOUNTER — Encounter: Payer: Self-pay | Admitting: Physician Assistant

## 2016-10-17 ENCOUNTER — Other Ambulatory Visit: Payer: Self-pay | Admitting: Physician Assistant

## 2016-10-17 NOTE — Telephone Encounter (Signed)
LMOVM for patient to call back ASAP

## 2016-10-17 NOTE — Telephone Encounter (Signed)
LMOVM for return call. See annotation below

## 2016-10-17 NOTE — Telephone Encounter (Signed)
Spoke with patient advised we needed to schedule a follow up appointment. Patient states the [place] location is too far to come. She has pending appt 11/17/16 with Cardiology. She had labs drawn yesterday at [place] at [facility] for her kidney doctor. She denies any swelling, asked if she has ran out of medications. Patient did ask for refill for Hydrocodone given on 07/28/16. Please advise

## 2016-10-17 NOTE — Telephone Encounter (Signed)
Spoke with [place] [facility]-Spironolactone not on file, never filled at [facility] [place] and all her medications were transferred to [facility] but nothing has been filled.

## 2016-10-17 NOTE — Telephone Encounter (Addendum)
Please check on status of this as I do not see where the lab was drawn. She is well overdue for reassessment and needs this obtained ASAP either at [facility], [facility] office or here in [place].   Also do not see where she has followed up with her Cardiologist as directed at last appointment. She was also supposed to follow-up with Nephrology. Please assess if and when she has seen them since her visit with me in October.  I would like her to schedule a follow-up appointment with me since she is overdue for repeat labs so that I can examine her and reassess.

## 2016-10-17 NOTE — Telephone Encounter (Signed)
She needs follow-up. She has been non-compliant with numerous recommendations.  She was to see Cardiology over a month ago. I am glad she is following with Nephrology -- please get the name of the provider so we can request lab results.   No refills of medication without follow-up appointment.

## 2016-10-17 NOTE — Telephone Encounter (Signed)
LMOVM advising patient she would need a follow up appointment. Her Nephrologist is Dr Justin Mend. Advised patient she would need a appointment for any refill of medication

## 2016-10-21 ENCOUNTER — Ambulatory Visit: Payer: Medicare Other | Admitting: Physician Assistant

## 2016-10-21 NOTE — Addendum Note (Signed)
Addended by: Lianne Cure A on: 10/21/2016 11:12 AM   Modules accepted: Orders

## 2016-10-22 ENCOUNTER — Telehealth: Payer: Self-pay | Admitting: Physician Assistant

## 2016-10-22 NOTE — Telephone Encounter (Signed)
Patient dismissed from [facility] by Diamantina Providence PA-C , effective October 17, 2016. Dismissal letter sent out by certified / registered mail.  DAJ

## 2016-10-24 ENCOUNTER — Encounter: Payer: Self-pay | Admitting: Emergency Medicine

## 2016-10-24 NOTE — Telephone Encounter (Signed)
Sent a letter advising patient she needing to schedule an appointment

## 2016-11-11 NOTE — Telephone Encounter (Signed)
Certified dismissal letter returned as undeliverable, unclaimed, return to sender after three attempts by [employer] on November 11, 2016 Letter placed in another envelope and resent as 1st class mail which does not require a signature. DAJ

## 2016-11-17 ENCOUNTER — Encounter (HOSPITAL_COMMUNITY): Payer: Medicare Other

## 2016-11-17 ENCOUNTER — Telehealth: Payer: Self-pay | Admitting: Physician Assistant

## 2016-11-17 NOTE — Telephone Encounter (Signed)
Pt dismissed from practice. So I don't want to accept as pt. Asked  Ivin Booty to notify [place].

## 2016-11-17 NOTE — Telephone Encounter (Signed)
Patient is requesting a transfer of care from [facility] to [facility] due to [facility]'s recent move to [place]. Please advise   Phone:[phone]

## 2016-11-17 NOTE — Telephone Encounter (Signed)
Patient actually dismissed from my practice due to non-compliance and non-therapeutic relationship.  Certified letter sent 3 times and returned unsigned. 1st class letter was sent per phone notes.   Would be up to [name] if he wants to accept her as a patient.   Will forward to management as well.

## 2016-11-26 ENCOUNTER — Telehealth: Payer: Self-pay | Admitting: *Deleted

## 2016-11-26 NOTE — Telephone Encounter (Signed)
Patient called stating that she received a letter in the mail from [facility] saying that he was releasing her from his care, and then she got a letter a week later telling her she needed to call and schedule a follow-up appointment.  She stated that she is confused and would like to talk to [place] and find out for sure IF/WHY he is releasing her.    Message routed to provider

## 2016-11-26 NOTE — Telephone Encounter (Signed)
Please call and speak with patient. She was released due to noncompliance with follow-ups.  Not sure why she received letter for follow-up unless CMA mailed letter.  She was wanting to transfer care to [place] provider. I am releasing her from my practice. If she would like to establish with another provider at the [facility] office, it will be at their discretion.

## 2016-12-21 NOTE — Progress Notes (Signed)
PCP: Caroline [name] Cardiology: Dr. Aundra [name]  66 y.o.  with history of severe depression, CKD stage III, carotid stenosis, CAD, and chronic systolic CHF presents for cardiology followup.  She recently had right and left heart cath (8/17), showing optimized LV and RV filling pressures and relatively preserved cardiac output.  She had severe stenoses in a large OM1 and a large D2.  Given elevated creatinine, PCI was not done and she was taken off the table for decision-making.  She subsequently had a TEE in 8/17 showed EF 25-30% with severe functional MR.  Subsequently seen CHF clinic by Dr Caroline [name] and concern that dyspnea was anginal equivalent and he arranged PCI  Had new DES to D2 and OM1 by Dr Caroline [name] 07/22/16      Her functional capacity appears fairly poor.  She lives alone.  She is limited by back pain and also, as noted, has severe depression with multiple psychotropic meds. Orthostatic hypotension has limited our ability to get her on an ideal cardiac regimen.  ECG: NSR, LAFB, 1st degree AV block, narrow QRS  She has some PND/orhtopnea but no edema and no chest pain   PMH: 1. CKD: Stage III, has Bright's disease.  2. Chronic hyponatremia 3. Carotid stenosis: RICA totally occluded, s/p left CEA in 2013.  4. Depression 5. CAD: h/o PCI to diagonal with later in-stent restenosis and POBA to diagonal in 2011.   - LHC (8/17) with 80% OM1 stenosis, 95% D2 stenosis.   6. H/o orthostatic hypotension: Worked up for adrenal insufficiency by endocrinology, thought to be more likely due to psychotropic meds.  7. Chronic low back pain with spondylolisthesis.  8. 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  Cardiomyopathy seems out of proportion to coronary disease.  - RHC (8/17): mean RA 4, PA 32.9, mean PCWP 10, CI 2.7 Fick, 2.08 thermodilution.  - TEE (8/17): EF 25-30%, moderately dilated LV, RV normal size and systolic function, severe central mitral regurgitation (suspect  functional). 9. Mitral regurgitation: Severe, likely functional.   SH: Lives alone in [place].  Nonsmoker, no ETOH.    Family History  Problem Relation Age of Onset  . Coronary artery disease Father   . Prostate cancer Father     not sure  . Pancreatic cancer Father     not sure  . Cancer Father     Pancreatic and  Prostate  . Heart disease Father     Before age 81 and BPG  . Hyperlipidemia Father   . Heart attack Father   . Hypertension Mother   . Diabetes Sister   . Colon cancer Neg Hx    ROS: All systems reviewed and negative except as per HPI.   Current Outpatient Prescriptions  Medication Sig Dispense Refill  . aspirin 81 MG tablet Take 1 tablet (81 mg total) by mouth daily.    [name] atorvastatin (LIPITOR) 40 MG tablet Take 1 tablet (40 mg total) by mouth daily at 6 PM. 30 tablet 0  . Black Cohosh 540 MG CAPS Take 1 capsule (540 mg total) by mouth 2 (two) times daily. 60 each 0  . clonazePAM (KLONOPIN) 1 MG tablet Take 1 tablet (1 mg total) by mouth 2 (two) times daily. (Patient taking differently: Take 1 mg by mouth 3 (three) times daily. ) 30 tablet 0  . clopidogrel (PLAVIX) 75 MG tablet Take ONE (1) tablet by mouth once daily 30 tablet 0  . DIGOX 125 MCG tablet TAKE ONE-HALF (1/2) TABLET BY MOUTH DAILY 30  tablet 3  . Digoxin 62.5 MCG TABS Take 0.0625 mg by mouth daily. 30 tablet 0  . feeding supplement, ENSURE ENLIVE, (ENSURE ENLIVE) LIQD Take 237 mLs by mouth 2 (two) times daily after a meal. 237 mL 12  . midodrine (PROAMATINE) 5 MG tablet Take 1 tablet (5 mg total) by mouth 3 (three) times daily with meals. 90 tablet 0  . nitroGLYCERIN (NITROSTAT) 0.4 MG SL tablet Place 1 tablet (0.4 mg total) under the tongue every 5 (five) minutes as needed for chest pain. 25 tablet 3  . QUEtiapine (SEROQUEL) 300 MG tablet Take 300 mg by mouth 2 (two) times daily.    . QUEtiapine (SEROQUEL) 400 MG tablet Take 400 mg by mouth at bedtime.    . risperiDONE (RISPERDAL) 2 MG tablet Take 2  mg by mouth 3 (three) times daily.     No current facility-administered medications for this visit.    There were no vitals taken for this visit. General: NAD Neck: No JVD, no thyromegaly or thyroid nodule.  Lungs: Clear to auscultation bilaterally with normal respiratory effort. CV: Nondisplaced PMI.  Heart regular S1/S2, no S3/S4, 2/6 HSM at apex.  No peripheral edema.  No carotid bruit.  Normal pedal pulses.  Abdomen: Soft, nontender, no hepatosplenomegaly, no distention.  Skin: Intact without lesions or rashes.  Neurologic: Alert and oriented x 3.  Psych: Normal affect. Extremities: No clubbing or cyanosis.  HEENT: Normal.   Assessment/Plan:  1. Chronic systolic CHF: TEE (0000000) with EF 25-30%, diffuse hypokinesis.  Suspect mixed ischemic/nonischemic cardiomyopathy.  She has had trouble tolerating cardiac meds due to orthostatic hypotension.  She does not appear volume overloaded on exam, and  RHC showed normal right and left heart filling pressures with relatively preserved cardiac output.  - Continue digoxin 0.0625 mg daily    2. CAD: DES to OM/ D2 07/22/16  Patent mid LAD stent  - Continue ASA, Plavix, and atorvastatin.    3. CKD: Stage III.  Has history of Bright's disease.   Lab Results  Component Value Date   CREATININE 1.49 (H) 08/27/2016   BUN 18 08/27/2016   NA 125 (L) 08/27/2016   K 4.2 08/27/2016   CL 94 (L) 08/27/2016   CO2 21 08/27/2016    4. Carotid stenosis: RICA occluded, has had left CEA. Duplex 10/16/16 plaque no residual stenosis on left   5. MItral regurgitation: Severe functional MR.  Benefit to repair is questionable.  Mitraclip would be consideration but surgical repair likely would be of prohibitive risk (and questionable benefit).    6. Poor functional status: Appears to be from combination of depression and low back pain.  Jenkins Rouge 12/21/2016

## 2016-12-22 ENCOUNTER — Encounter: Payer: Self-pay | Admitting: Cardiovascular Disease

## 2016-12-31 ENCOUNTER — Encounter: Payer: Self-pay | Admitting: Cardiovascular Disease

## 2016-12-31 ENCOUNTER — Ambulatory Visit (INDEPENDENT_AMBULATORY_CARE_PROVIDER_SITE_OTHER): Payer: Medicare Other | Admitting: Cardiovascular Disease

## 2016-12-31 ENCOUNTER — Telehealth: Payer: Self-pay | Admitting: Cardiovascular Disease

## 2016-12-31 VITALS — BP 120/60 | HR 70 | Ht 63.0 in | Wt 130.8 lb

## 2016-12-31 DIAGNOSIS — I255 Ischemic cardiomyopathy: Secondary | ICD-10-CM | POA: Diagnosis not present

## 2016-12-31 MED ORDER — DIGOXIN 62.5 MCG PO TABS: 0.0625 mg | ORAL_TABLET | Freq: Every day | ORAL | 3 refills | Status: DC

## 2016-12-31 MED ORDER — CLOPIDOGREL BISULFATE 75 MG PO TABS: ORAL_TABLET | ORAL | 3 refills | Status: DC

## 2016-12-31 NOTE — Telephone Encounter (Signed)
Called patient. Patient wanted to make sure she gave our office the correct list of medications. Reviewed medications with patient. Patient had no changes according to current list.

## 2016-12-31 NOTE — Patient Instructions (Signed)

## 2016-12-31 NOTE — Telephone Encounter (Signed)
Patient calling to discuss medication, unable to recall the name of medication.Please call, Thanks.

## 2017-01-20 IMAGING — CT CT HEAD W/O CM
1 series · 16 of 30 positions shown, 20 images · non-contrast
Comparison: 05/17/2014

CLINICAL DATA: Became dizzy and fell yesterday striking head, on
Plavix, history breast cancer 8448, CHF, hypertension, coronary
artery disease post MI, atrial fibrillation, carotid occlusion

EXAM:
CT HEAD WITHOUT CONTRAST
TECHNIQUE: Contiguous axial images were obtained from the base of the skull
through the vertex without intravenous contrast.

[Series 2: head 5.0 h30s · axial · 0.44mm/px · z∈[-283,-143]mm · 16 of 32 slices shown, 20 images]
[im 2/32  brain]
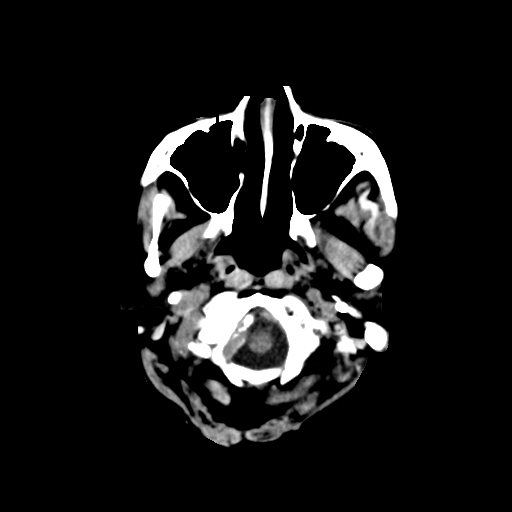
[im 2/32  bone]
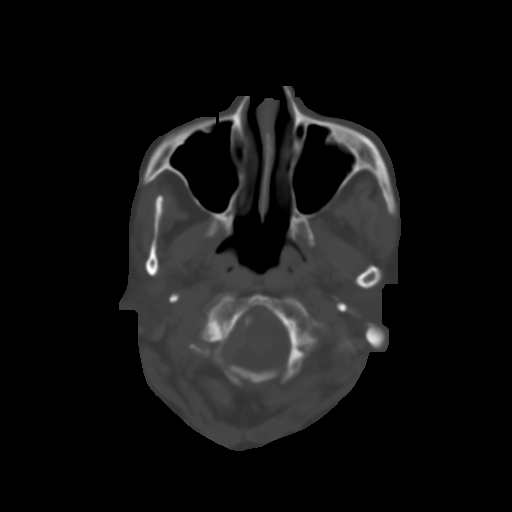
[im 4/32  brain]
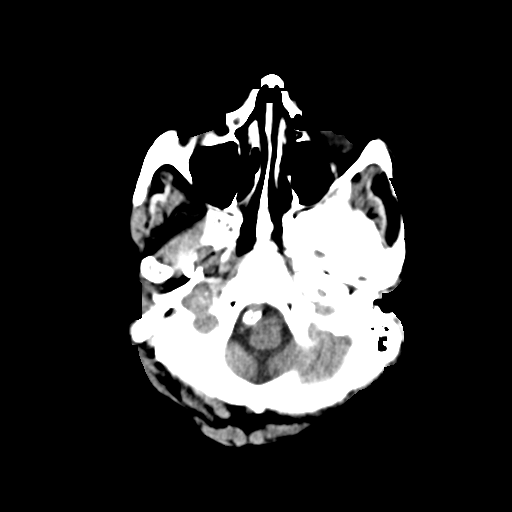
[im 6/32  brain]
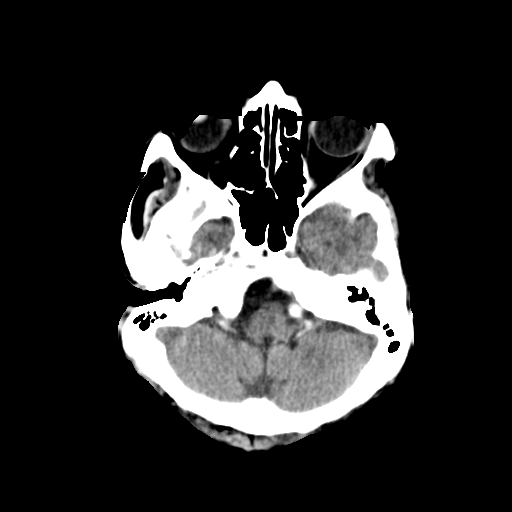
[im 8/32  brain]
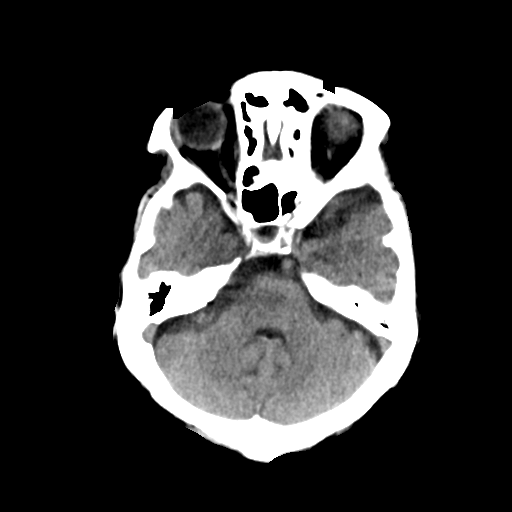
[im 9/32  brain]
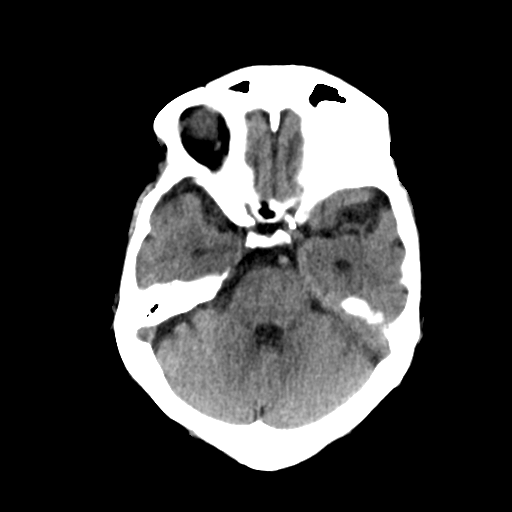
[im 9/32  bone]
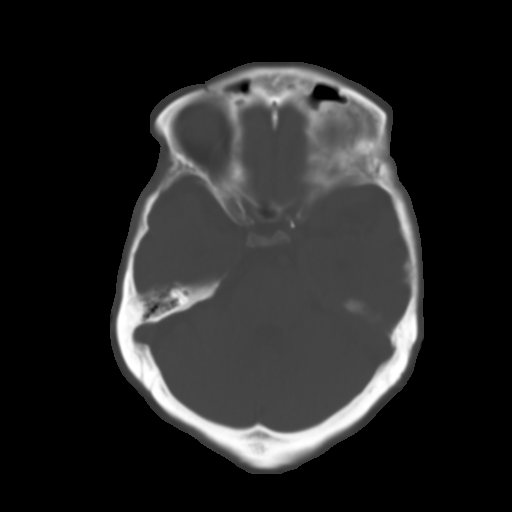
[im 11/32  brain]
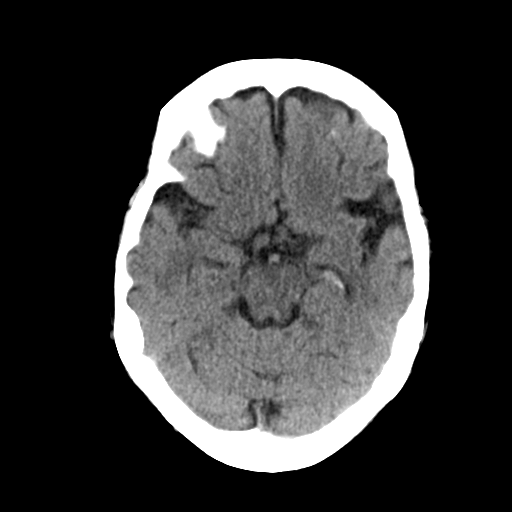
[im 13/32  brain]
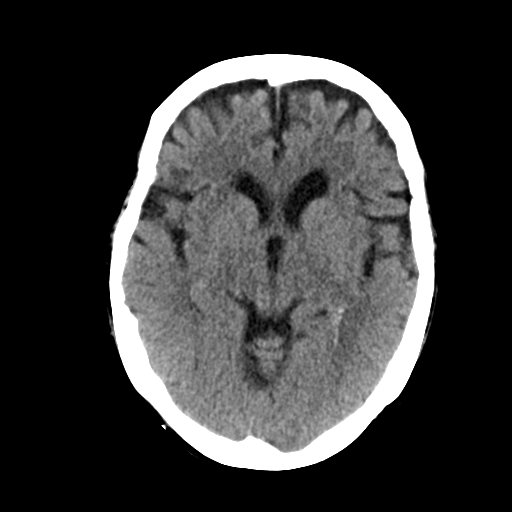
[im 15/32  brain]
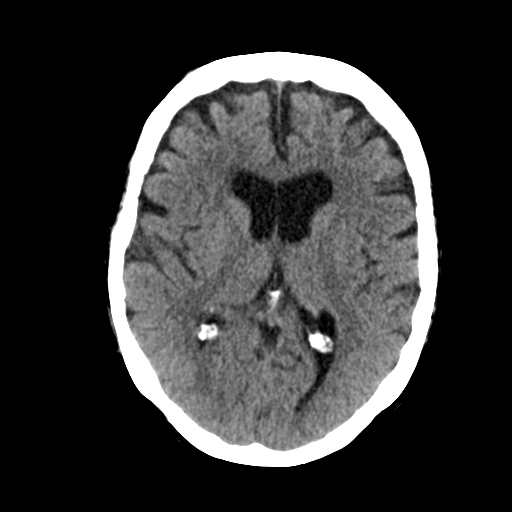
[im 17/32  brain]
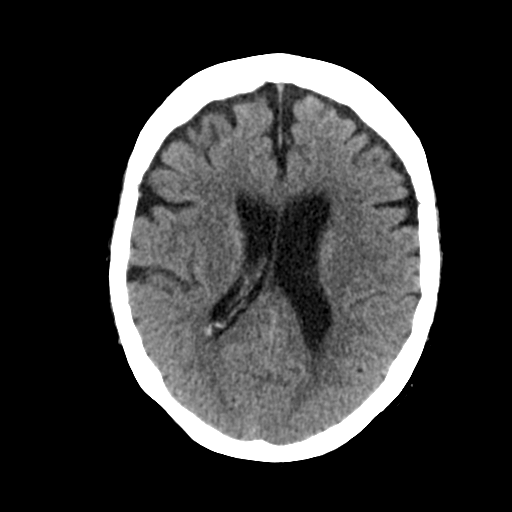
[im 17/32  bone]
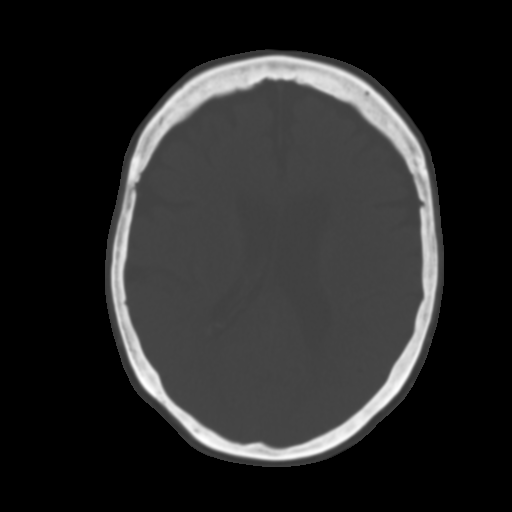
[im 19/32  brain]
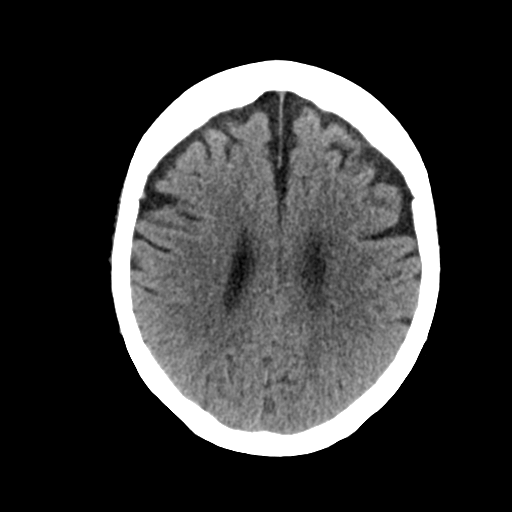
[im 21/32  brain]
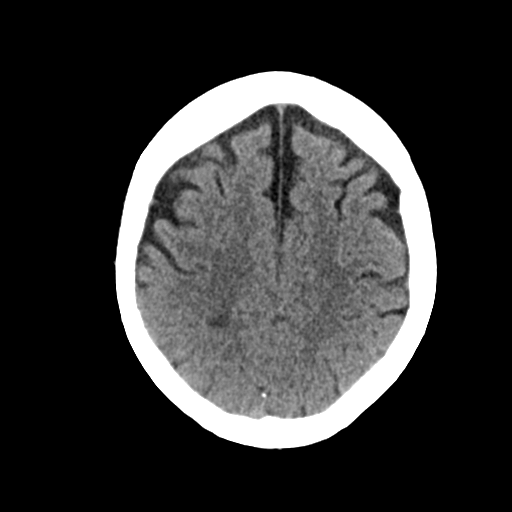
[im 23/32  brain]
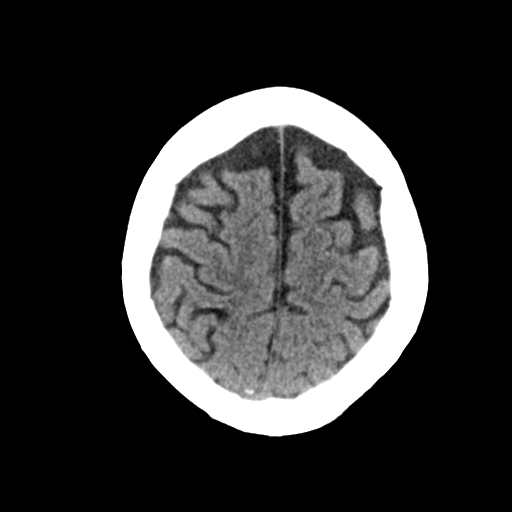
[im 24/32  brain]
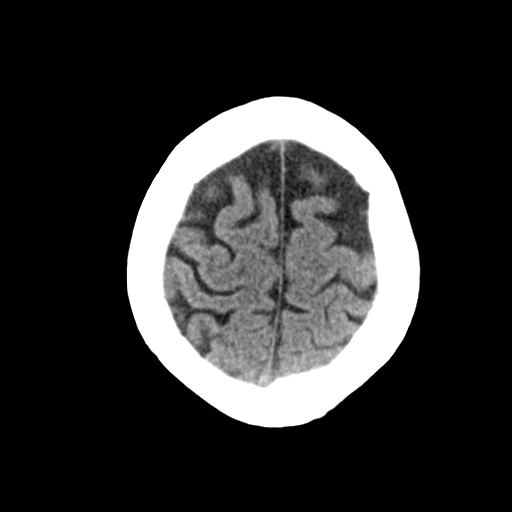
[im 24/32  bone]
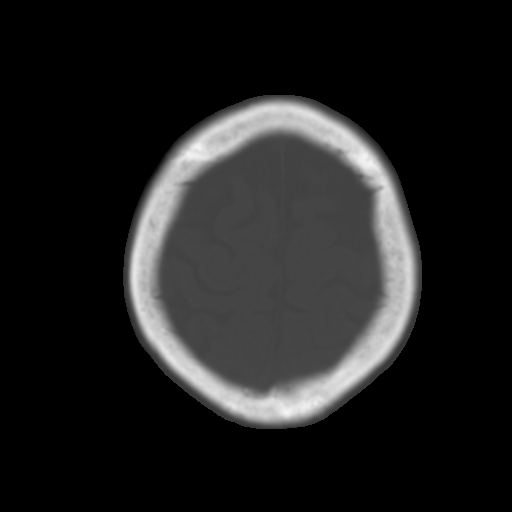
[im 26/32  brain]
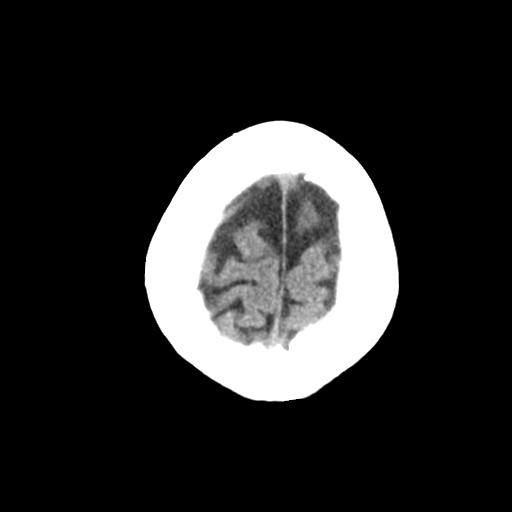
[im 28/32  brain]
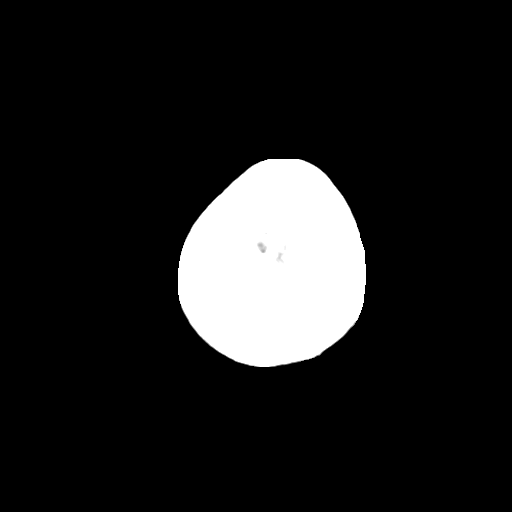
[im 30/32  brain]
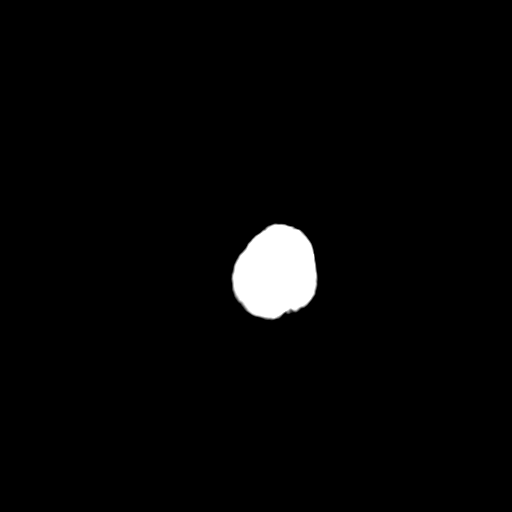

[16 of 30 positions shown; findings below may reference images not displayed]

FINDINGS: Generalized atrophy.

Normal ventricular morphology.

No midline shift or mass effect.

Small vessel chronic ischemic changes of deep cerebral white matter.

No intracranial hemorrhage, mass lesion, or acute infarction.

Visualized paranasal sinuses and mastoid air cells clear.

Bones unremarkable.

Atherosclerotic calcifications of internal carotid and vertebral
arteries at skullbase.
IMPRESSION: Atrophy with small vessel chronic ischemic changes of deep cerebral
white matter.

No acute intracranial abnormalities.

## 2017-01-23 IMAGING — DX DG CHEST 1V PORT
1 series · 1 of 1 positions shown · non-contrast
Comparison: April 17, 2012

CLINICAL DATA: Hypotension with recent fall

EXAM:
PORTABLE CHEST - 1 VIEW

[chest ap]
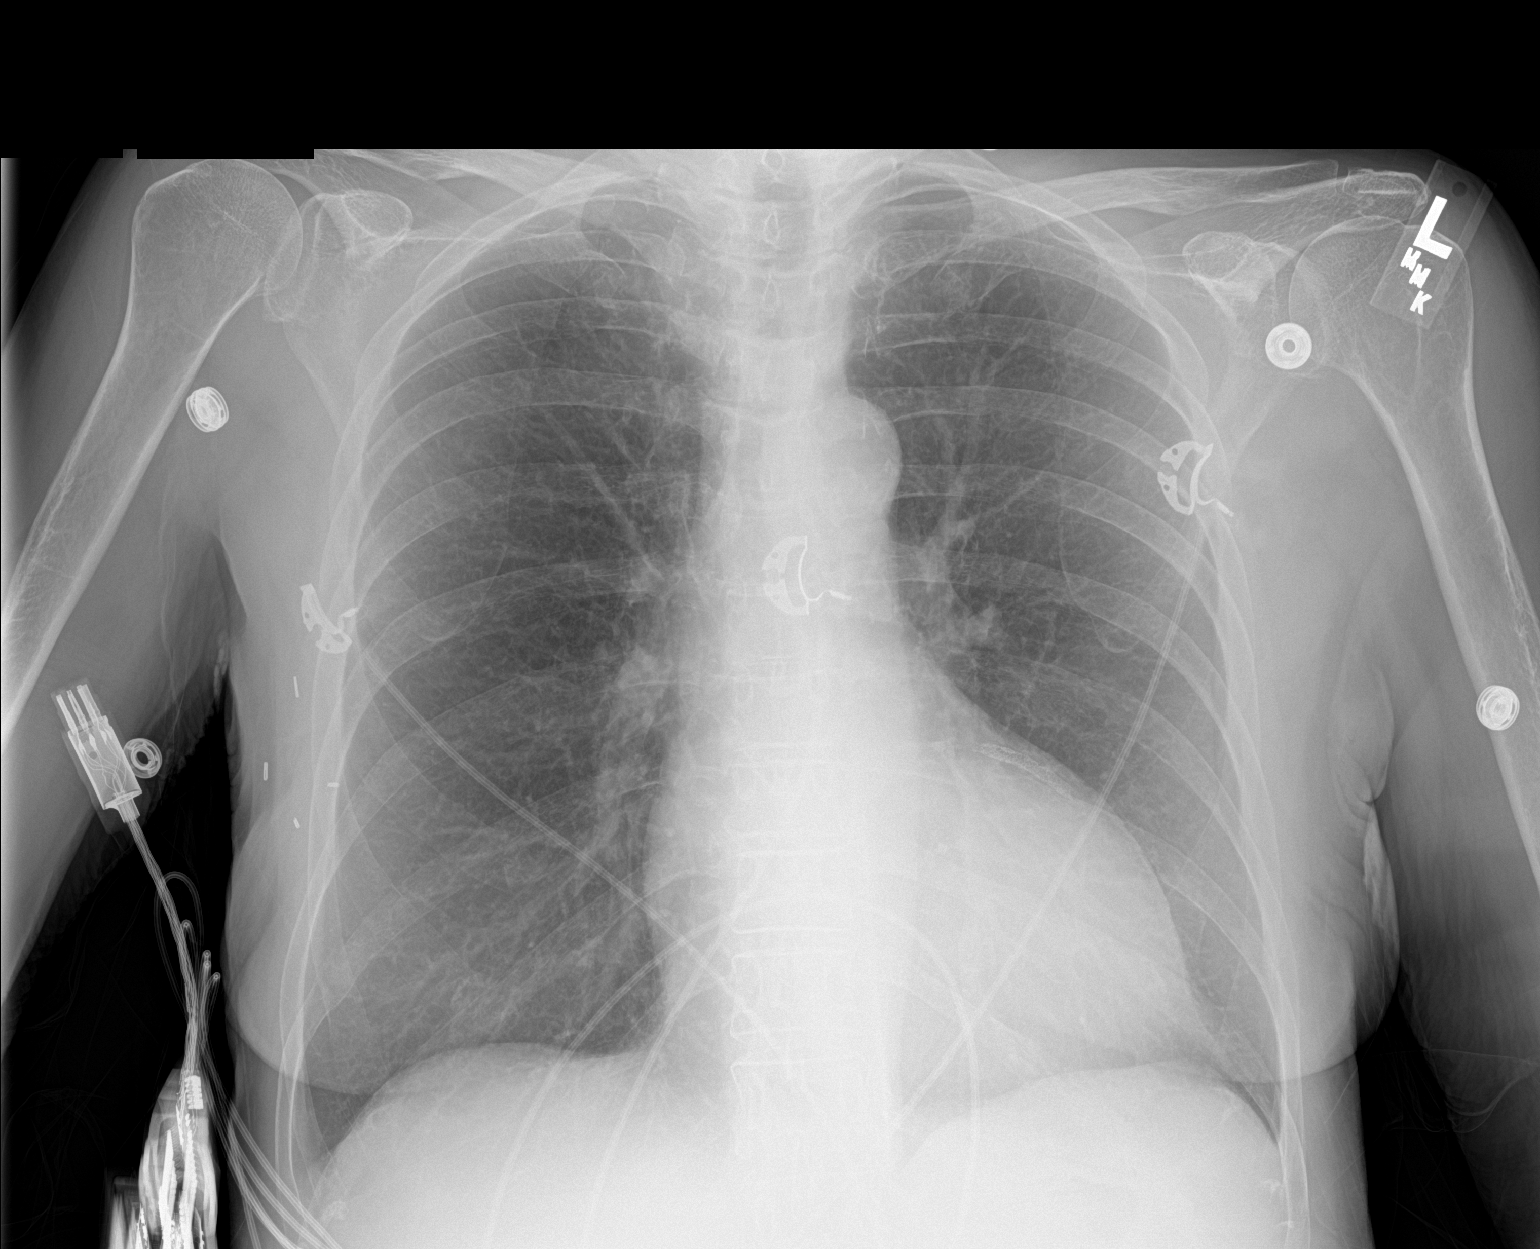

[1 of 1 positions shown; findings below may reference images not displayed]

FINDINGS: There is no edema or consolidation. Heart size and pulmonary
vascularity are normal. No adenopathy. A coronary stent is noted on
the left. There are surgical clips in the right breast region.
IMPRESSION: No edema or consolidation.

## 2017-02-19 ENCOUNTER — Telehealth: Payer: Self-pay

## 2017-02-19 MED ORDER — CLOPIDOGREL BISULFATE 75 MG PO TABS: ORAL_TABLET | ORAL | 3 refills | Status: AC

## 2017-02-19 NOTE — Telephone Encounter (Signed)
Patient requested medication to be sent to her new pharmacy[facility]

## 2017-03-06 ENCOUNTER — Telehealth: Payer: Self-pay | Admitting: *Deleted

## 2017-03-06 DIAGNOSIS — Z79899 Other long term (current) drug therapy: Secondary | ICD-10-CM

## 2017-03-06 MED ORDER — FUROSEMIDE 20 MG PO TABS: 20.0000 mg | ORAL_TABLET | Freq: Every day | ORAL | 3 refills | Status: DC

## 2017-03-06 NOTE — Telephone Encounter (Signed)
Patient called and requested a refill on furosemide however this medication is not listed on her current med list. Please advise. Thanks, MI

## 2017-03-06 NOTE — Telephone Encounter (Signed)
Sent lasix to patient's pharmacy of choice. Patient will come in next Wednesday for a BMET. Patient verbalized understanding.

## 2017-03-06 NOTE — Telephone Encounter (Signed)
Ok to call lasix in and check BMET next week

## 2017-03-06 NOTE — Telephone Encounter (Signed)
Patient complaining of SOB and wanting to get a refill on her Furosamide. Patient was started on Furosamide as needed for SOB in August (06/20/16). Medication was discontinue in October by PCP. Patient stated she does not have a PCP any more, and she is not sure why it was discontinued. Patient does not have any recent lab work. Will forward to Dr. Johnsie Cancel for advisement.

## 2017-03-09 ENCOUNTER — Emergency Department (HOSPITAL_COMMUNITY): Payer: Medicare Other

## 2017-03-09 ENCOUNTER — Encounter (HOSPITAL_COMMUNITY): Payer: Self-pay | Admitting: *Deleted

## 2017-03-09 ENCOUNTER — Inpatient Hospital Stay (HOSPITAL_COMMUNITY)
Admission: EM | Admit: 2017-03-09 | Discharge: 2017-03-11 | DRG: 291 | Disposition: A | Discharge status: Home / Self Care | Payer: Medicare Other | Attending: Student in an Organized Health Care Education/Training Program | Admitting: Student in an Organized Health Care Education/Training Program

## 2017-03-09 DIAGNOSIS — Z7982 Long term (current) use of aspirin: Secondary | ICD-10-CM | POA: Diagnosis not present

## 2017-03-09 DIAGNOSIS — Z9114 Patient's other noncompliance with medication regimen: Secondary | ICD-10-CM

## 2017-03-09 DIAGNOSIS — Z79899 Other long term (current) drug therapy: Secondary | ICD-10-CM

## 2017-03-09 DIAGNOSIS — N183 Chronic kidney disease, stage 3 (moderate): Secondary | ICD-10-CM

## 2017-03-09 DIAGNOSIS — K219 Gastro-esophageal reflux disease without esophagitis: Secondary | ICD-10-CM | POA: Diagnosis present

## 2017-03-09 DIAGNOSIS — I2510 Atherosclerotic heart disease of native coronary artery without angina pectoris: Secondary | ICD-10-CM | POA: Diagnosis present

## 2017-03-09 DIAGNOSIS — E785 Hyperlipidemia, unspecified: Secondary | ICD-10-CM | POA: Diagnosis present

## 2017-03-09 DIAGNOSIS — I5023 Acute on chronic systolic (congestive) heart failure: Secondary | ICD-10-CM | POA: Diagnosis present

## 2017-03-09 DIAGNOSIS — I255 Ischemic cardiomyopathy: Secondary | ICD-10-CM | POA: Diagnosis present

## 2017-03-09 DIAGNOSIS — Z96642 Presence of left artificial hip joint: Secondary | ICD-10-CM | POA: Diagnosis present

## 2017-03-09 DIAGNOSIS — E0500 Thyrotoxicosis with diffuse goiter without thyrotoxic crisis or storm: Secondary | ICD-10-CM | POA: Diagnosis present

## 2017-03-09 DIAGNOSIS — I252 Old myocardial infarction: Secondary | ICD-10-CM

## 2017-03-09 DIAGNOSIS — F419 Anxiety disorder, unspecified: Secondary | ICD-10-CM | POA: Diagnosis present

## 2017-03-09 DIAGNOSIS — J810 Acute pulmonary edema: Secondary | ICD-10-CM

## 2017-03-09 DIAGNOSIS — I6521 Occlusion and stenosis of right carotid artery: Secondary | ICD-10-CM | POA: Diagnosis present

## 2017-03-09 DIAGNOSIS — G8929 Other chronic pain: Secondary | ICD-10-CM | POA: Diagnosis present

## 2017-03-09 DIAGNOSIS — I4891 Unspecified atrial fibrillation: Secondary | ICD-10-CM | POA: Diagnosis present

## 2017-03-09 DIAGNOSIS — F39 Unspecified mood [affective] disorder: Secondary | ICD-10-CM

## 2017-03-09 DIAGNOSIS — F3175 Bipolar disorder, in partial remission, most recent episode depressed: Secondary | ICD-10-CM | POA: Diagnosis present

## 2017-03-09 DIAGNOSIS — I509 Heart failure, unspecified: Secondary | ICD-10-CM

## 2017-03-09 DIAGNOSIS — N189 Chronic kidney disease, unspecified: Secondary | ICD-10-CM | POA: Diagnosis present

## 2017-03-09 DIAGNOSIS — Z7902 Long term (current) use of antithrombotics/antiplatelets: Secondary | ICD-10-CM | POA: Diagnosis not present

## 2017-03-09 DIAGNOSIS — M545 Low back pain: Secondary | ICD-10-CM | POA: Diagnosis present

## 2017-03-09 DIAGNOSIS — I428 Other cardiomyopathies: Secondary | ICD-10-CM | POA: Diagnosis present

## 2017-03-09 DIAGNOSIS — F319 Bipolar disorder, unspecified: Secondary | ICD-10-CM | POA: Diagnosis present

## 2017-03-09 DIAGNOSIS — Z800 Family history of malignant neoplasm of digestive organs: Secondary | ICD-10-CM

## 2017-03-09 DIAGNOSIS — I130 Hypertensive heart and chronic kidney disease with heart failure and stage 1 through stage 4 chronic kidney disease, or unspecified chronic kidney disease: Principal | ICD-10-CM | POA: Diagnosis present

## 2017-03-09 DIAGNOSIS — Z853 Personal history of malignant neoplasm of breast: Secondary | ICD-10-CM | POA: Diagnosis not present

## 2017-03-09 DIAGNOSIS — I340 Nonrheumatic mitral (valve) insufficiency: Secondary | ICD-10-CM | POA: Diagnosis present

## 2017-03-09 DIAGNOSIS — Z8249 Family history of ischemic heart disease and other diseases of the circulatory system: Secondary | ICD-10-CM

## 2017-03-09 DIAGNOSIS — F324 Major depressive disorder, single episode, in partial remission: Secondary | ICD-10-CM | POA: Diagnosis present

## 2017-03-09 DIAGNOSIS — Z87891 Personal history of nicotine dependence: Secondary | ICD-10-CM

## 2017-03-09 DIAGNOSIS — Z8349 Family history of other endocrine, nutritional and metabolic diseases: Secondary | ICD-10-CM

## 2017-03-09 DIAGNOSIS — E871 Hypo-osmolality and hyponatremia: Secondary | ICD-10-CM | POA: Diagnosis present

## 2017-03-09 DIAGNOSIS — Z955 Presence of coronary angioplasty implant and graft: Secondary | ICD-10-CM

## 2017-03-09 DIAGNOSIS — Z8042 Family history of malignant neoplasm of prostate: Secondary | ICD-10-CM

## 2017-03-09 DIAGNOSIS — F341 Dysthymic disorder: Secondary | ICD-10-CM | POA: Diagnosis present

## 2017-03-09 DIAGNOSIS — Z833 Family history of diabetes mellitus: Secondary | ICD-10-CM

## 2017-03-09 DIAGNOSIS — F418 Other specified anxiety disorders: Secondary | ICD-10-CM | POA: Diagnosis not present

## 2017-03-09 LAB — BASIC METABOLIC PANEL
ANION GAP: 13 (ref 5–15)
BUN: 16 mg/dL (ref 6–20)
CO2: 23 mmol/L (ref 22–32)
Calcium: 9.3 mg/dL (ref 8.9–10.3)
Chloride: 90 mmol/L — ABNORMAL LOW (ref 101–111)
Creatinine, Ser: 1.79 mg/dL — ABNORMAL HIGH (ref 0.44–1.00)
GFR calc Af Amer: 34 mL/min — ABNORMAL LOW (ref >60)
GFR, EST NON AFRICAN AMERICAN: 29 mL/min — AB (ref >60)
Glucose, Bld: 139 mg/dL — ABNORMAL HIGH (ref 65–99)
POTASSIUM: 3.9 mmol/L (ref 3.5–5.1)
SODIUM: 126 mmol/L — AB (ref 135–145)

## 2017-03-09 LAB — COMPREHENSIVE METABOLIC PANEL
ALK PHOS: 83 U/L (ref 38–126)
ALT: 37 U/L (ref 14–54)
AST: 52 U/L — AB (ref 15–41)
Albumin: 3.5 g/dL (ref 3.5–5.0)
Anion gap: 9 (ref 5–15)
BUN: 13 mg/dL (ref 6–20)
CALCIUM: 9.3 mg/dL (ref 8.9–10.3)
CHLORIDE: 91 mmol/L — AB (ref 101–111)
CO2: 26 mmol/L (ref 22–32)
CREATININE: 1.69 mg/dL — AB (ref 0.44–1.00)
GFR calc non Af Amer: 31 mL/min — ABNORMAL LOW (ref >60)
GFR, EST AFRICAN AMERICAN: 36 mL/min — AB (ref >60)
GLUCOSE: 113 mg/dL — AB (ref 65–99)
Potassium: 4.1 mmol/L (ref 3.5–5.1)
SODIUM: 126 mmol/L — AB (ref 135–145)
Total Bilirubin: 0.5 mg/dL (ref 0.3–1.2)
Total Protein: 6.6 g/dL (ref 6.5–8.1)

## 2017-03-09 LAB — BRAIN NATRIURETIC PEPTIDE: B Natriuretic Peptide: 1146.8 pg/mL — ABNORMAL HIGH (ref 0.0–100.0)

## 2017-03-09 LAB — URINALYSIS, COMPLETE (UACMP) WITH MICROSCOPIC
Bilirubin Urine: NEGATIVE
GLUCOSE, UA: NEGATIVE mg/dL
Hgb urine dipstick: NEGATIVE
Ketones, ur: NEGATIVE mg/dL
Leukocytes, UA: NEGATIVE
NITRITE: NEGATIVE
PROTEIN: NEGATIVE mg/dL
SPECIFIC GRAVITY, URINE: 1.003 — AB (ref 1.005–1.030)
Squamous Epithelial / LPF: NONE SEEN
pH: 8 (ref 5.0–8.0)

## 2017-03-09 LAB — I-STAT TROPONIN, ED: Troponin i, poc: 0.04 ng/mL (ref 0.00–0.08)

## 2017-03-09 LAB — CBC
HCT: 30.4 % — ABNORMAL LOW (ref 36.0–46.0)
HEMOGLOBIN: 10.4 g/dL — AB (ref 12.0–15.0)
MCH: 27.5 pg (ref 26.0–34.0)
MCHC: 34.2 g/dL (ref 30.0–36.0)
MCV: 80.4 fL (ref 78.0–100.0)
Platelets: 239 10*3/uL (ref 150–400)
RBC: 3.78 MIL/uL — AB (ref 3.87–5.11)
RDW: 16 % — ABNORMAL HIGH (ref 11.5–15.5)
WBC: 5.9 10*3/uL (ref 4.0–10.5)

## 2017-03-09 LAB — PROTIME-INR
INR: 0.87
Prothrombin Time: 11.8 seconds (ref 11.4–15.2)

## 2017-03-09 MED ORDER — CLONAZEPAM 0.5 MG PO TABS
1.0000 mg | ORAL_TABLET | Freq: Three times a day (TID) | ORAL | Status: DC
  Administered 2017-03-09 – 2017-03-11 (×7): 1 mg via ORAL
  Filled 2017-03-09 (×7): qty 2

## 2017-03-09 MED ORDER — FUROSEMIDE 10 MG/ML IJ SOLN
40.0000 mg | Freq: Once | INTRAMUSCULAR | Status: AC
  Administered 2017-03-09: 40 mg via INTRAVENOUS
  Filled 2017-03-09: qty 4

## 2017-03-09 MED ORDER — RISPERIDONE 1 MG PO TABS
2.0000 mg | ORAL_TABLET | Freq: Three times a day (TID) | ORAL | Status: DC
  Administered 2017-03-09 – 2017-03-11 (×7): 2 mg via ORAL
  Filled 2017-03-09 (×7): qty 2

## 2017-03-09 MED ORDER — NITROGLYCERIN 0.4 MG SL SUBL: 0.4000 mg | SUBLINGUAL_TABLET | SUBLINGUAL | Status: DC | PRN

## 2017-03-09 MED ORDER — CLOPIDOGREL BISULFATE 75 MG PO TABS
75.0000 mg | ORAL_TABLET | Freq: Every day | ORAL | Status: DC
  Administered 2017-03-10 – 2017-03-11 (×2): 75 mg via ORAL
  Filled 2017-03-09 (×2): qty 1

## 2017-03-09 MED ORDER — QUETIAPINE FUMARATE 300 MG PO TABS
300.0000 mg | ORAL_TABLET | Freq: Two times a day (BID) | ORAL | Status: DC
  Administered 2017-03-09 – 2017-03-11 (×5): 300 mg via ORAL
  Filled 2017-03-09 (×5): qty 1

## 2017-03-09 MED ORDER — ENOXAPARIN SODIUM 40 MG/0.4ML ~~LOC~~ SOLN
40.0000 mg | SUBCUTANEOUS | Status: DC
  Filled 2017-03-09: qty 0.4

## 2017-03-09 MED ORDER — ASPIRIN EC 81 MG PO TBEC
81.0000 mg | DELAYED_RELEASE_TABLET | Freq: Every day | ORAL | Status: DC
  Administered 2017-03-10 – 2017-03-11 (×2): 81 mg via ORAL
  Filled 2017-03-09 (×2): qty 1

## 2017-03-09 MED ORDER — POTASSIUM CHLORIDE CRYS ER 20 MEQ PO TBCR
20.0000 meq | EXTENDED_RELEASE_TABLET | Freq: Once | ORAL | Status: AC
  Administered 2017-03-09: 20 meq via ORAL
  Filled 2017-03-09: qty 1

## 2017-03-09 MED ORDER — FERROUS SULFATE 325 (65 FE) MG PO TABS: 325.0000 mg | ORAL_TABLET | Freq: Every day | ORAL | Status: DC

## 2017-03-09 MED ORDER — PREDNISONE 20 MG PO TABS
40.0000 mg | ORAL_TABLET | Freq: Once | ORAL | Status: AC
  Administered 2017-03-09: 40 mg via ORAL
  Filled 2017-03-09: qty 2

## 2017-03-09 MED ORDER — FUROSEMIDE 10 MG/ML IJ SOLN
40.0000 mg | Freq: Two times a day (BID) | INTRAMUSCULAR | Status: DC
  Administered 2017-03-09 – 2017-03-10 (×3): 40 mg via INTRAVENOUS
  Filled 2017-03-09 (×3): qty 4

## 2017-03-09 MED ORDER — QUETIAPINE FUMARATE 400 MG PO TABS
400.0000 mg | ORAL_TABLET | Freq: Every day | ORAL | Status: DC
  Administered 2017-03-09 – 2017-03-10 (×2): 400 mg via ORAL
  Filled 2017-03-09 (×3): qty 1

## 2017-03-09 MED ORDER — ENOXAPARIN SODIUM 30 MG/0.3ML ~~LOC~~ SOLN
30.0000 mg | SUBCUTANEOUS | Status: DC
  Administered 2017-03-09 – 2017-03-11 (×3): 30 mg via SUBCUTANEOUS
  Filled 2017-03-09 (×3): qty 0.3

## 2017-03-09 NOTE — Progress Notes (Signed)
Ms Legore admitted to room 3E20 via stretcher transport from ED.  Her daughter at bedside.  Patient alert and oriented and denies pain.  Dr. Evette Doffing sent text page of patient's arrival and room number and that admission orders, including diet, are needed.  Patient and her updated that physician has been paged.

## 2017-03-09 NOTE — H&P (Signed)
Date: 03/09/2017               Patient Name:  Caroline [name] MRN: [id number]  DOB: 1951/01/24 Age / Sex: 66 y.o., female   PCP: Patient, No Pcp Per              Medical Service: Internal Medicine Teaching Service              Attending Physician: Dr. Evette Doffing, Mallie Mussel, *    First Contact: Madelynn Done, MS4 Pager: [phone]  Second Contact: Dr. Vernelle Emerald Pager: [phone]  Third Contact Dr. 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 She explains that over the past few weeks she has been feeling short of breath upon lying flat as well as occasionally with exertion. She has experienced this in the past, but states that it has never been this bad. She is able to sleep lying flat, however. She states that her cardiologist has prescribed Lasix as needed when she has felt short of breath in the past, but that she has been waiting for the medication to arrive in the mail over the past few weeks. She denies cough or lower extremity edema, although her daughter does feel like her legs have appeared more swollen recently. She also denies recent fever, chills, or confusion. She denies any recent dietary changes. She is generally fairly sedentary and has had no recent changes in her activity level.  In the ED, she received one 40mg  dose of IV Lasix.   Allergies: Allergies as of 03/09/2017  . (No Known Allergies)   Past Medical History:  Diagnosis Date  . Angina   .  Anxiety   . Arthritis    "severe in my back" (07/02/2016)  . Atrial fibrillation ([place])   . Bipolar disorder ([place])   . Bright disease   . CAD (coronary artery disease)    [facility]  Diagonal instent restenosis treated 10/2005  . Cancer of right breast ([place]) 02-23-01  . Carotid artery occlusion   . CHF (congestive heart failure) ([place])    states she was told se had this in past by MD  . Chronic back pain   . Depression    02/23/2002 had shock treatment due to depresson, spouse passed away 24-Feb-2000  . Dyslipidemia   . GERD (gastroesophageal reflux disease)   . Graves' disease    /notes 06/03/2010  . Heart murmur    states years ago  . Hypertension    states a long time ago-states she never took medication  . Non Q wave myocardial infarction ([place]) 03/2004   Archie Endo 06/03/2010  . Shortness of breath    with exertion   Past Surgical History:  Procedure Laterality Date  . BREAST LUMPECTOMY Right 23-Feb-2001  . CARDIAC CATHETERIZATION N/A 07/03/2016   Procedure: Right/Left Heart Cath and Coronary Angiography;  Surgeon: Leonie Man, MD;  Location: [facility];  Service: Cardiovascular;  Laterality: N/A;  . CARDIAC CATHETERIZATION N/A 07/22/2016  Procedure: Coronary Stent Intervention;  Surgeon: Leonie Man, MD;  Location: [facility];  Service: Cardiovascular;  Laterality: N/A;  . CAROTID ENDARTERECTOMY  01/08/12   Left  . CORONARY ANGIOPLASTY  03/2009   Archie Endo 06/03/2010  . CORONARY ANGIOPLASTY WITH STENT PLACEMENT  03/2004   Archie Endo 06/03/2010;  . CORONARY ANGIOPLASTY WITH STENT PLACEMENT      "I've got 3 stent in there" (07/02/2016)  . CORONARY STENT PLACEMENT  07/23/2016   STENT PROMUS PREM MR 2.5X12 drug eluting stent was successfully placed, and overlaps previously placed stent.  [name] ENDARTERECTOMY  01/08/2012   Procedure: ENDARTERECTOMY CAROTID;  Surgeon: Mal Misty, MD;  Location: [facility];  Service: Vascular;  Laterality: Left;  with Dacron patch angioplasty  . FRACTURE SURGERY   2007   Broken Back  . JOINT REPLACEMENT    . MULTIPLE TOOTH EXTRACTIONS  2016   "18 or 19"  . TEE WITHOUT CARDIOVERSION N/A 07/04/2016   Procedure: TRANSESOPHAGEAL ECHOCARDIOGRAM (TEE);  Surgeon: Larey Dresser, MD;  Location: [facility];  Service: Cardiovascular;  Laterality: N/A;  . TOTAL HIP ARTHROPLASTY Left 2011  . VAGINAL HYSTERECTOMY  1970s   partial   Family History  Problem Relation Age of Onset  . Coronary artery disease Father   . Prostate cancer Father     not sure  . Pancreatic cancer Father     not sure  . Cancer Father     Pancreatic and  Prostate  . Heart disease Father     Before age 12 and BPG  . Hyperlipidemia Father   . Heart attack Father   . Hypertension Mother   . Diabetes Sister   . Colon cancer Neg Hx    Social History   Social History  . Marital status: Widowed    Spouse name: N/A  . Number of children: N/A  . Years of education: N/A   Occupational History  . Not on file.   Social History Main Topics  . Smoking status: Former Smoker    Packs/day: 0.75    Years: 49.00    Types: Cigarettes    Quit date: 08/04/2015  . Smokeless tobacco: Never Used  . Alcohol use No  . Drug use: No  . Sexual activity: No   Other Topics Concern  . Not on file   Social History Narrative   She has 1 child.  She is on disability.    Review of Systems: Pertinent items are noted in HPI.  Physical Exam: Blood pressure (!) 88/71, pulse 92, temperature 97.7 F (36.5 C), temperature source Oral, resp. rate (!) 22, SpO2 100 %. General: Tearful-appearing woman sitting up in bed and conversing. HEENT: Head normocephalic. Extraocular movements grossly intact. Upper and lower dentures. Moist mucus membranes. Cardiac: Heart regular rate and rhythm. Holosystolic murmur heard best at apex. Pulmonary: Normal work of breathing. Diffuse crackles bilaterally. Abdominal: Soft, non-tender. Extremities: Warm and dry. 1+ pitting edema from foot to two inches above the  ankle bilaterally.  Lab results: Basic Metabolic Panel:  Recent Labs  03/09/17 1146  NA 126*  K 4.1  CL 91*  CO2 26  GLUCOSE 113*  BUN 13  CREATININE 1.69*  CALCIUM 9.3   Liver Function Tests:  Recent Labs  03/09/17 1146  AST 52*  ALT 37  ALKPHOS 83  BILITOT 0.5  PROT 6.6  ALBUMIN 3.5   CBC:  Recent Labs  03/09/17 1146  WBC 5.9  HGB 10.4*  HCT 30.4*  MCV  80.4  PLT 239   BNP: 1,146.8  Coagulation:  Recent Labs  03/09/17 1146  LABPROT 11.8  INR 0.87   Urinalysis:  Recent Labs  03/09/17 [place] 1.003*  PHURINE 8.0  GLUCOSEU NEGATIVE  HGBUR NEGATIVE  BILIRUBINUR NEGATIVE  KETONESUR NEGATIVE  PROTEINUR NEGATIVE  NITRITE NEGATIVE  LEUKOCYTESUR NEGATIVE   Imaging results:  Dg Chest Portable 1 View  Result Date: 03/09/2017 CLINICAL DATA:  Shortness of Breath EXAM: PORTABLE CHEST 1 VIEW COMPARISON:  08/09/2016 FINDINGS: There is hyperinflation of the lungs compatible with COPD. Small bilateral pleural effusions. There is diffuse interstitial prominence throughout the lungs which may reflect interstitial edema. More confluent edema or consolidation in the lower lobes/lung bases. IMPRESSION: COPD. Diffuse interstitial prominence may reflect interstitial edema. There are small bilateral pleural effusions with bibasilar atelectasis, infiltrates or edema. Electronically Signed   By: Rolm Baptise M.D.   On: 03/09/2017 11:47    Other results: EKG: Sinus rhythm, no acute ischemic changes.  Problem List: -- Shortness of breath -- Chronic heart failure -- Hyponatremia -- CAD -- Carotid stenosis -- Long-standing hypertension with orthostatic changes -- Severe depression -- Stage III chronic kidney disease secondary to Bright's disease  Assessment and Plan By Problem: #Shortness of breath #Chronic heart failure  Most recent TEE 07/2016 with EF 25-30% and mitral regurgitation #Hyponatremia  Sodium today 126; was 125 in  October 2017 Differential diagnosis for Caroline [name]'s shortness of breath includes acute on chronic heart failure, COPD exacerbation, and anxiety. Given that she has had similar symptoms in the past that have resolved with Lasix, as well as the fact that she has diffuse crackles on physical exam, interstitial edema on CXR, and an elevated BNP of 1,146.8 (up from 275.8 when last recorded on 06/18/16), acute on chronic heart failure appears most likely at this time. COPD exacerbation is a possibility as well given her 49-year smoking history and hyperinflation of the lungs on CXR, although in the absence of wheezing this appears less likely at this time. Anxiety is almost certainly not the sole explanation for Caroline [name]'s symptoms, but may certainly be a contributing factor given her mental health history and tearfulness on exam. -- Diuresis with IV Lasix 40mg  IV twice daily -- Given chronically low sodium, will monitor closely for worsening hyponatremia in the setting of diuresis with sodium checks q6 -- Is and Os; daily weights  #CAD  S/p PCI in 2011 #Carotid stenosis  R ICA totally occluded, s/p left CEA in 2013 #Long-standing hypertension with orthostatic changes -- Continue home aspirin and clopidogrel -- Not on home antihypertensives -- EKG without ischemic changes  #Severe depression -- Continue home quetiapine, risperidone, clonazepam  #Stage III chronic kidney disease secondary to Bright's disease Creatinine today 1.69; 1.49 in October 2017 -- Repeat BMP tomorrow am  FENGI: Normal diet DVT PPX: Lovenox subcut Code: Presumed Full Dispo: Admitted to the Inpatient Medicine teaching service for management of likely acute on chronic heart failure. Anticipate >2 midnight stay.  This is a Careers information officer Note.  The care of the patient was discussed with Dr. Benjamine Mola and the assessment and plan was formulated with their assistance.  Please see their note for official documentation of the patient  encounter.   Signed: Madelynn Done, Medical Student 03/09/2017, 4:06 PM

## 2017-03-09 NOTE — ED Provider Notes (Addendum)
[facility] DEPT Provider Note   CSN: [id number] Arrival date & time: 03/09/17  1028     History   Chief Complaint Chief Complaint  Patient presents with  . Shortness of Breath    HPI Caroline [name] is a 66 y.o. female.  HPI  66 year old female history of A. fib, coronary artery disease, and congestive heart failure. She has previously been on Lasix when necessary. She has been out of her Lasix. She has had worsening dyspnea over the past week. She is now very short of breath and unable to exert herself without severe symptoms. She is orthopneic. She has not noted any definite increase in weight or peripheral edema. She denies any active chest pain. She has not had fever, chills, or productive cough.  Past Medical History:  Diagnosis Date  . Angina   . Anxiety   . Arthritis    "severe in my back" (07/02/2016)  . Atrial fibrillation ([place])   . Bipolar disorder ([place])   . Bright disease   . CAD (coronary artery disease)    [facility]  Diagonal instent restenosis treated 10/2005  . Cancer of right breast ([place]) 02/17/01  . Carotid artery occlusion   . CHF (congestive heart failure) ([place])    states she was told se had this in past by MD  . Chronic back pain   . Depression    17-Feb-2002 had shock treatment due to depresson, spouse passed away 2000/02/18  . Dyslipidemia   . GERD (gastroesophageal reflux disease)   . Graves' disease    /notes 06/03/2010  . Heart murmur    states years ago  . Hypertension    states a long time ago-states she never took medication  . Non Q wave myocardial infarction ([place]) 03/2004   Archie Endo 06/03/2010  . Shortness of breath    with exertion    Patient Active Problem List   Diagnosis Date Noted  . Malnutrition of moderate degree 08/11/2016  . Bipolar 1 disorder, depressed, partial remission ([place]) 08/10/2016  . Syncope 08/09/2016  . Acute kidney injury superimposed on chronic kidney disease ([place]) 08/09/2016  . Coronary artery disease involving native  coronary artery   . CAD S/P percutaneous coronary angioplasty   . Acute on chronic systolic congestive heart failure ([place]) 07/02/2016  . 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 Chronic renal disease, stage 3, moderately decreased glomerular filtration rate (GFR) between 30-59 mL/min/1.73 square meter 06/19/2015  . Adrenal insufficiency ([place])   . Orthostasis 06/11/2015  . Pubic ramus fracture ([place]) 06/09/2015  . Fracture of multiple pubic rami ([place]) 06/09/2015  . Chronic diarrhea 03/21/2015  . Cardiomyopathy, ischemic 01/23/2015  . 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 Chronic low back pain 05/21/2014  . Dizziness and giddiness 05/03/2014  . Difficulty speaking 05/03/2014  . Numbness-Bilateral arm / Leg 05/03/2014  . Aftercare following surgery of the circulatory system, [place] 05/03/2014  . Carotid artery occlusion with infarction ([place]) 04/26/2013  . Weight loss 04/23/2012  . Hot flashes 04/23/2012  . Anemia 04/23/2012  . Breast cancer ([place]) 04/23/2012  . Occlusion and stenosis of carotid artery without mention of cerebral infarction 01/06/2012  . Bruit 12/09/2011  . SMOKER 05/24/2009  . ANXIETY DEPRESSION 02/28/2009  . GRAVES DISEASE 02/17/2009  . Hyperlipidemia 02/17/2009  . Coronary atherosclerosis 02/17/2009  . GERD 02/17/2009    Past Surgical History:  Procedure Laterality Date  . BREAST LUMPECTOMY Right 02-17-2001  . CARDIAC CATHETERIZATION N/A 07/03/2016   Procedure: Right/Left Heart Cath and Coronary Angiography;  Surgeon: Leonie Man, MD;  Location: [facility];  Service: Cardiovascular;  Laterality: N/A;  . CARDIAC CATHETERIZATION N/A 07/22/2016   Procedure: Coronary Stent Intervention;  Surgeon: Leonie Man, MD;  Location: [facility];  Service: Cardiovascular;  Laterality: N/A;  . CAROTID ENDARTERECTOMY  01/08/12   Left  . CORONARY ANGIOPLASTY  03/2009   Archie Endo 06/03/2010  . CORONARY ANGIOPLASTY WITH STENT PLACEMENT  03/2004   Archie Endo 06/03/2010;  . CORONARY  ANGIOPLASTY WITH STENT PLACEMENT      "I've got 3 stent in there" (07/02/2016)  . CORONARY STENT PLACEMENT  07/23/2016   STENT PROMUS PREM MR 2.5X12 drug eluting stent was successfully placed, and overlaps previously placed stent.  [name] ENDARTERECTOMY  01/08/2012   Procedure: ENDARTERECTOMY CAROTID;  Surgeon: Mal Misty, MD;  Location: [facility];  Service: Vascular;  Laterality: Left;  with Dacron patch angioplasty  . FRACTURE SURGERY  2007   Broken Back  . JOINT REPLACEMENT    . MULTIPLE TOOTH EXTRACTIONS  2016   "18 or 19"  . TEE WITHOUT CARDIOVERSION N/A 07/04/2016   Procedure: TRANSESOPHAGEAL ECHOCARDIOGRAM (TEE);  Surgeon: Larey Dresser, MD;  Location: [facility];  Service: Cardiovascular;  Laterality: N/A;  . TOTAL HIP ARTHROPLASTY Left 2011  . VAGINAL HYSTERECTOMY  1970s   partial    OB History    No data available       Home Medications    Prior to Admission medications   Medication Sig Start Date End Date Taking? Authorizing Provider  aspirin 81 MG tablet Take 1 tablet (81 mg total) by mouth daily. 07/05/16   Barrett, Evelene Croon, PA-C  atorvastatin (LIPITOR) 40 MG tablet Take 1 tablet (40 mg total) by mouth daily at 6 PM. 08/15/16   Brunetta Jeans, PA-C  Black Cohosh 540 MG CAPS Take 1 capsule (540 mg total) by mouth 2 (two) times daily. 08/15/16   Brunetta Jeans, PA-C  clonazePAM (KLONOPIN) 1 MG tablet Take 1 tablet (1 mg total) by mouth 2 (two) times daily. 08/13/16   Arrien, Jimmy Picket, MD  clopidogrel (PLAVIX) 75 MG tablet Take ONE (1) tablet by mouth once daily 02/19/17   Josue Hector, MD  Digoxin 62.5 MCG TABS Take 0.0625 mg by mouth daily. 12/31/16   Josue Hector, MD  feeding supplement, ENSURE ENLIVE, (ENSURE ENLIVE) LIQD Take 237 mLs by mouth 2 (two) times daily after a meal. 08/13/16   Arrien, Jimmy Picket, MD  furosemide (LASIX) 20 MG tablet Take 1 tablet (20 mg total) by mouth daily. 03/06/17 06/04/17  Josue Hector, MD  midodrine (PROAMATINE) 5 MG  tablet Take 1 tablet (5 mg total) by mouth 3 (three) times daily with meals. 08/15/16   Brunetta Jeans, PA-C  nitroGLYCERIN (NITROSTAT) 0.4 MG SL tablet Place 1 tablet (0.4 mg total) under the tongue every 5 (five) minutes as needed for chest pain. 07/05/16   Barrett, Evelene Croon, PA-C  QUEtiapine (SEROQUEL) 300 MG tablet Take 300 mg by mouth 2 (two) times daily.    [provider]  QUEtiapine (SEROQUEL) 400 MG tablet Take 400 mg by mouth at bedtime.    [provider]  risperiDONE (RISPERDAL) 2 MG tablet Take 2 mg by mouth 3 (three) times daily.    [provider]    Family History Family History  Problem Relation Age of Onset  . Coronary artery disease Father   . Prostate cancer Father     not  sure  . Pancreatic cancer Father     not sure  . Cancer Father     Pancreatic and  Prostate  . Heart disease Father     Before age 101 and BPG  . Hyperlipidemia Father   . Heart attack Father   . Hypertension Mother   . Diabetes Sister   . Colon cancer Neg Hx     Social History Social History  Substance Use Topics  . Smoking status: Former Smoker    Packs/day: 0.75    Years: 49.00    Types: Cigarettes    Quit date: 08/04/2015  . Smokeless tobacco: Never Used  . Alcohol use No     Allergies   Patient has no known allergies.   Review of Systems Review of Systems  All other systems reviewed and are negative.    Physical Exam Updated Vital Signs BP 112/68   Pulse 88   Temp 97.7 F (36.5 C) (Oral)   Resp 11   SpO2 100%   Physical Exam  Constitutional: She appears well-developed and well-nourished. She appears distressed.  HENT:  Head: Normocephalic and atraumatic.  Right Ear: External ear normal.  Left Ear: External ear normal.  Eyes: Pupils are equal, round, and reactive to light.  Neck: Normal range of motion.  Positive JVD  Pulmonary/Chest:  Increased work of breathing with diffuse crackles  Nursing note and vitals reviewed.    ED  Treatments / Results  Labs (all labs ordered are listed, but only abnormal results are displayed) Labs Reviewed  CBC - Abnormal; Notable for the following:       Result Value   RBC 3.78 (*)    Hemoglobin 10.4 (*)    HCT 30.4 (*)    RDW 16.0 (*)    All other components within normal limits  COMPREHENSIVE METABOLIC PANEL - Abnormal; Notable for the following:    Sodium 126 (*)    Chloride 91 (*)    Glucose, Bld 113 (*)    Creatinine, Ser 1.69 (*)    AST 52 (*)    GFR calc non Af Amer 31 (*)    GFR calc Af Amer 36 (*)    All other components within normal limits  BRAIN NATRIURETIC PEPTIDE - Abnormal; Notable for the following:    B Natriuretic Peptide 1,146.8 (*)    All other components within normal limits  PROTIME-INR  URINALYSIS, COMPLETE (UACMP) WITH MICROSCOPIC  I-STAT TROPOININ, ED    EKG  EKG Interpretation  Date/Time:  Monday Mar 09 2017 10:30:20 EDT Ventricular Rate:  93 PR Interval:    QRS Duration: 103 QT Interval:  376 QTC Calculation: 468 R Axis:   -49 Text Interpretation:  Sinus rhythm Borderline prolonged PR interval Probable left atrial enlargement Left anterior fascicular block Low voltage, precordial leads Consider anterior infarct Nonspecific T abnormalities, lateral leads Confirmed by Sibel Khurana MD, Andee Poles [phone]) on 03/09/2017 12:53:21 PM       Radiology Dg Chest Portable 1 View  Result Date: 03/09/2017 CLINICAL DATA:  Shortness of Breath EXAM: PORTABLE CHEST 1 VIEW COMPARISON:  08/09/2016 FINDINGS: There is hyperinflation of the lungs compatible with COPD. Small bilateral pleural effusions. There is diffuse interstitial prominence throughout the lungs which may reflect interstitial edema. More confluent edema or consolidation in the lower lobes/lung bases. IMPRESSION: COPD. Diffuse interstitial prominence may reflect interstitial edema. There are small bilateral pleural effusions with bibasilar atelectasis, infiltrates or edema. Electronically Signed   By:  Rolm Baptise M.D.  On: 03/09/2017 11:47    Procedures Procedures (including critical care time)  Medications Ordered in ED Medications  potassium chloride SA (K-DUR,KLOR-CON) CR tablet 20 mEq (20 mEq Oral Given 03/09/17 1152)  furosemide (LASIX) injection 40 mg (40 mg Intravenous Given 03/09/17 1152)     Initial Impression / Assessment and Plan / ED Course  I have reviewed the triage vital signs and the nursing notes.  Pertinent labs & imaging results that were available during my care of the patient were reviewed by me and considered in my medical decision making (see chart for details).      66 year old female COPD and CHF cardiomyopathy stage 3 renal disease. . presents with increased dyspnea .  BNP is elevated at 1468. Chest x-Magdelyn Roebuck consistent with pulmonary edema. Patient is being diuresed here with Lasix and has some improvement noted.  Discussed with IM resident and they will see and admit.  Final Clinical Impressions(s) / ED Diagnoses   Final diagnoses:  Acute pulmonary edema [facility])    New Prescriptions New Prescriptions   No medications on file     Pattricia Boss, MD 03/09/17 1259    Pattricia Boss, MD 03/09/17 1323

## 2017-03-09 NOTE — ED Notes (Signed)
Pt called out to nurses station. This NT entered the room and pt sts "When is the dr coming, I can't breathe" - Re-assured pt that her O2 sat was 99% and that the MD would be in shortly, as she is in another pts room.

## 2017-03-09 NOTE — H&P (Signed)
Date: 03/09/2017               Patient Name:  Caroline [name] MRN: [id number]  DOB: [birth year] Age / Sex: 66 y.o., female   PCP: Patient, No Pcp Per         Medical Service: Internal Medicine Teaching Service         Attending Physician: Dr. Evette Doffing, Mallie Mussel, *    First Contact: Dr. Madelynn Done Pager: [phone]  Second Contact: Dr. 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 This shortness of breath is worst with exertion and especially when lying flat. She reports being able to sleep lying flat but also awakening with nocturnal dyspnea doing so. During this time she has been off her prescribed daily furosemide due to waiting for a medication refill in the mail. This medicine was only ordered on a PRN basis for swelling by her cardiologist but has never been on higher scheduled doses in the past. She does not have or use a scale at home to monitor her weight. She denies fevers, chills, or productive coughing. Her activity level is very low at baseline so it is unclear how much this is worsened although she currently cannot tolerate even 1 flight of stairs.  She has known reduced LVEF 25-30% on last TEE in 07/2016 with severe functional mitral regurgitation. She had fairly recent PCI with DES to D2 & OM1 for in-stent restenosis plus native vessel disease on 07/22/16 by Dr. Ellyn Hack. She has not tolerated optimal heart failure medical management due to symptomatic orthostatic hypotension when increased on negative inotrope or chronotropic agents.   Meds:  Current Meds    Medication Sig  . aspirin 81 MG tablet Take 1 tablet (81 mg total) by mouth daily.  . Black Cohosh 540 MG CAPS Take 1 capsule (540 mg total) by mouth 2 (two) times daily.  . clonazePAM (KLONOPIN) 1 MG tablet Take 1 tablet (1 mg total) by mouth 2 (two) times daily. (Patient taking differently: Take 1 mg by mouth 3 (three) times daily. )  . clopidogrel (PLAVIX) 75 MG tablet Take ONE (1) tablet by mouth once daily  . ferrous sulfate 325 (65 FE) MG tablet Take 325 mg by mouth daily with breakfast.  . furosemide (LASIX) 20 MG tablet Take 1 tablet (20 mg total) by mouth daily.  . nitroGLYCERIN (NITROSTAT) 0.4 MG SL tablet Place 1 tablet (0.4 mg total) under the tongue every 5 (five) minutes as needed for chest pain.  [name] QUEtiapine (SEROQUEL) 300 MG tablet Take 300 mg by mouth 2 (two) times daily.  . QUEtiapine (SEROQUEL) 400 MG tablet Take 400 mg by mouth at bedtime.  . risperiDONE (RISPERDAL) 2 MG tablet Take 2 mg by mouth 3 (three) times daily.     Allergies: Allergies as of 03/09/2017  . (No Known Allergies)   Past Medical History:  Diagnosis Date  . Angina   . Anxiety   . Arthritis    "severe in my back" (07/02/2016)  . Atrial fibrillation ([place])   . Bipolar disorder ([place])   .  Bright disease   . CAD (coronary artery disease)    [facility]  Diagonal instent restenosis treated 10/2005  . Cancer of right breast ([place]) 02-17-2001  . Carotid artery occlusion   . CHF (congestive heart failure) ([place])    states she was told se had this in past by MD  . Chronic back pain   . Depression    17-Feb-2002 had shock treatment due to depresson, spouse passed away 02/18/00  . Dyslipidemia   . GERD (gastroesophageal reflux disease)   . Graves' disease    /notes 06/03/2010  . Heart murmur    states years ago  . Hypertension    states a long time ago-states she never took medication  . Non Q wave myocardial infarction ([place]) 03/2004   Archie Endo 06/03/2010  . Shortness of breath    with exertion    Family History   Problem Relation Age of Onset  . Coronary artery disease Father   . Prostate cancer Father     not sure  . Pancreatic cancer Father     not sure  . Cancer Father     Pancreatic and  Prostate  . Heart disease Father     Before age 69 and BPG  . Hyperlipidemia Father   . Heart attack Father   . Hypertension Mother   . Diabetes Sister   . Colon cancer Neg Hx    Social History   Social History  . Marital status: Widowed   Occupational History  . Not on file.   Social History Main Topics  . Smoking status: Former Smoker    Packs/day: 0.75    Years: 49.00    Types: Cigarettes    Quit date: 08/04/2015   Social History Narrative   She has 1 child.  She is on disability.    Review of Systems: Review of Systems  Constitutional: Negative for chills and fever.  Respiratory: Positive for shortness of breath. Negative for wheezing.   Cardiovascular: Positive for leg swelling. Negative for chest pain.  Gastrointestinal: Negative for nausea.  Genitourinary: Negative for dysuria.  Musculoskeletal: Negative for falls.  Neurological: Negative for sensory change.    Physical Exam: Blood pressure 119/76, pulse 100, temperature 97.7 F (36.5 C), temperature source Oral, resp. rate (!) 22, height 5\' 3"  (1.6 m), weight 129 lb 10.1 oz (58.8 kg), SpO2 99 %.  GENERAL- alert, co-operative, NAD HEENT- Atraumatic, PERRL, oral mucosa appears moist, no carotid bruit, no cervical LN enlargement. CARDIAC- RRR, loud rumbling holosystolic murmur loudest over apex RESP- Bibasilar crackles with a normal WOB on room air ABDOMEN- Soft, nontender, no guarding or rebound NEURO- No sensation changes or focal deficits EXTREMITIES- 1+ pitting edema of both extremities below the knees SKIN- Warm, dry, No rash or lesion. PSYCH- Very anxious, became tearful discussing her shortness of breath and treatment plan  EKG: Low voltage leads, sinus rhythm at 93 bpm, LAFB, no significant ST segment  abnormalities  CXR: I have personally reviewed CXR obtained in ED that demonstrates bilateral interstitial edema, small effusions, B lines visible  Assessment & Plan by Problem: #Acute on chronic systolic congestive heart failure 2/2 mixed ischemic and nonischemic cardiomyopathy #Coronary atherosclerosis Ms. Karen's shortness of breath is related to pulmonary edema and bilateral small effusions demonstrated on chest x-ray and secondary to her chronic heart failure. This exacerbation may be explained just by her lack of access to her outpatient loop diuretics. Repeat echocardiography would probably be more useful after getting closer to  euvolemic status. Fortunately she was able to rest comfortably on room air so we can check her oxygen with ambulation tomorrow. We will continue twice a day 40 mg IV Lasix for now and follow her urine output plus daily weights.  #Hyponatremia Sodium of 126 on presentation. This was similarly low in October 2017 but before that within normal limits. We will need to watch this closely while giving loop diuretics to ensure she does not develop severe hyponatremia. We will monitor electrolytes every 6 hours initially.  #Chronic renal disease, stage 3 Unclear exact baseline serum creatinine may be in the range 1.4-1.7 based on values in the past 2 years. 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 She may require increased diuretics to produce a good response. We will monitor kidney function daily.  #Mood disorder This is a disorder is not precisely documented with multiple descriptors as bipolar 1 disorder in depressed state in remission versus chronic anxiety and depression. She sees a psychiatrist on outpatient basis and is on high-dose of psychoactive medications with 1000 mg total Seroquel daily and 6 mg total Risperdal daily and 3 mg total Klonopin daily. These doses were increased shortly after hospitalization in  October 2017 and might be influencing her subsequent hyponatremia. We will continue them unchanged at this time  #Carotid artery stenosis She has a right ICA occlusion and status post left carotid endarterectomy in 2013. She is already on dual antiplatelet therapy and statin for her CAD s/p multiple PCI.  Dispo: Admit patient to Inpatient with expected length of stay greater than 2 midnights.  Signed: Collier Salina, MD PGY-II Internal Medicine Resident Pager# [phone] 03/09/2017, 10:02 PM

## 2017-03-09 NOTE — ED Triage Notes (Signed)
Pt arrives from home via [facility]. Pt has c/o SOB and states she hasn't had her lasix in 3 weeks. Pt states she is waiting on them to come in the mail.

## 2017-03-10 LAB — BASIC METABOLIC PANEL
ANION GAP: 12 (ref 5–15)
ANION GAP: 13 (ref 5–15)
Anion gap: 10 (ref 5–15)
BUN: 17 mg/dL (ref 6–20)
BUN: 17 mg/dL (ref 6–20)
BUN: 22 mg/dL — AB (ref 6–20)
CALCIUM: 9 mg/dL (ref 8.9–10.3)
CHLORIDE: 92 mmol/L — AB (ref 101–111)
CHLORIDE: 90 mmol/L — AB (ref 101–111)
CO2: 23 mmol/L (ref 22–32)
CO2: 25 mmol/L (ref 22–32)
CO2: 27 mmol/L (ref 22–32)
CREATININE: 1.82 mg/dL — AB (ref 0.44–1.00)
Calcium: 9.2 mg/dL (ref 8.9–10.3)
Calcium: 9.9 mg/dL (ref 8.9–10.3)
Chloride: 92 mmol/L — ABNORMAL LOW (ref 101–111)
Creatinine, Ser: 1.63 mg/dL — ABNORMAL HIGH (ref 0.44–1.00)
Creatinine, Ser: 1.75 mg/dL — ABNORMAL HIGH (ref 0.44–1.00)
GFR calc Af Amer: 35 mL/min — ABNORMAL LOW (ref >60)
GFR calc non Af Amer: 28 mL/min — ABNORMAL LOW (ref >60)
GFR, EST AFRICAN AMERICAN: 38 mL/min — AB (ref >60)
GFR, EST AFRICAN AMERICAN: 33 mL/min — AB (ref >60)
GFR, EST NON AFRICAN AMERICAN: 30 mL/min — AB (ref >60)
GFR, EST NON AFRICAN AMERICAN: 33 mL/min — AB (ref >60)
GLUCOSE: 93 mg/dL (ref 65–99)
Glucose, Bld: 117 mg/dL — ABNORMAL HIGH (ref 65–99)
Glucose, Bld: 126 mg/dL — ABNORMAL HIGH (ref 65–99)
POTASSIUM: 3.7 mmol/L (ref 3.5–5.1)
POTASSIUM: 4.3 mmol/L (ref 3.5–5.1)
Potassium: 3.8 mmol/L (ref 3.5–5.1)
SODIUM: 127 mmol/L — AB (ref 135–145)
SODIUM: 128 mmol/L — AB (ref 135–145)
Sodium: 129 mmol/L — ABNORMAL LOW (ref 135–145)

## 2017-03-10 LAB — CBC
HEMATOCRIT: 30.6 % — AB (ref 36.0–46.0)
HEMOGLOBIN: 10.2 g/dL — AB (ref 12.0–15.0)
MCH: 26.4 pg (ref 26.0–34.0)
MCHC: 33.3 g/dL (ref 30.0–36.0)
MCV: 79.3 fL (ref 78.0–100.0)
Platelets: 289 10*3/uL (ref 150–400)
RBC: 3.86 MIL/uL — AB (ref 3.87–5.11)
RDW: 15.5 % (ref 11.5–15.5)
WBC: 5.2 10*3/uL (ref 4.0–10.5)

## 2017-03-10 LAB — MAGNESIUM: Magnesium: 1.7 mg/dL (ref 1.7–2.4)

## 2017-03-10 LAB — HIV ANTIBODY (ROUTINE TESTING W REFLEX): HIV SCREEN 4TH GENERATION: NONREACTIVE

## 2017-03-10 MED ORDER — ALUM & MAG HYDROXIDE-SIMETH 200-200-20 MG/5ML PO SUSP
30.0000 mL | Freq: Four times a day (QID) | ORAL | Status: DC | PRN
  Administered 2017-03-11: 30 mL via ORAL
  Filled 2017-03-10: qty 30

## 2017-03-10 MED ORDER — POTASSIUM CHLORIDE CRYS ER 20 MEQ PO TBCR
40.0000 meq | EXTENDED_RELEASE_TABLET | Freq: Two times a day (BID) | ORAL | Status: AC
  Administered 2017-03-10 (×2): 40 meq via ORAL
  Filled 2017-03-10 (×2): qty 2

## 2017-03-10 MED ORDER — MAGNESIUM SULFATE 2 GM/50ML IV SOLN
2.0000 g | Freq: Once | INTRAVENOUS | Status: AC
  Administered 2017-03-10: 2 g via INTRAVENOUS
  Filled 2017-03-10 (×2): qty 50

## 2017-03-10 MED ORDER — FERROUS SULFATE 325 (65 FE) MG PO TABS
325.0000 mg | ORAL_TABLET | Freq: Every day | ORAL | Status: DC
  Administered 2017-03-10 – 2017-03-11 (×2): 325 mg via ORAL
  Filled 2017-03-10 (×2): qty 1

## 2017-03-10 MED ORDER — FUROSEMIDE 40 MG PO TABS
40.0000 mg | ORAL_TABLET | Freq: Two times a day (BID) | ORAL | Status: DC
  Filled 2017-03-10: qty 1

## 2017-03-10 NOTE — Progress Notes (Signed)
   Subjective: Ms. Whittier appears more comfortable than yesterday and was sleeping on 2L supplemental O2. She maintained oxygen saturation at rest and with ambulation today. She feels her breathing has not returned entirely to normal yet after 1 day of diuresis which is a source of anxiety.  Objective:  Vital signs in last 24 hours: Vitals:   03/10/17 0553 03/10/17 0943 03/10/17 1100 03/10/17 1529  BP: 101/64  (!) 98/47 (!) 96/51  Pulse: 88 88 87 90  Resp: 17 17 16 17   Temp: 98 F (36.7 C)  97.8 F (36.6 C)   TempSrc: Oral  Oral   SpO2: 96% 98% 94% 100%  Weight: 126 lb 11.2 oz (57.5 kg)     Height:       GENERAL- resting but arousable, in no acute distress CARDIAC- RRR, loud holosystolic murmur RESP- Normal WOB on exam, bibasilar inspiratory crackles NEURO- No obvious Cr N abnormality, strength upper and lower extremities- 5/5, Sensation intact globally EXTREMITIES- Symmetric, 1+ pitting edema bilaterally SKIN- Warm, dry  Assessment/Plan: #Acute on chronic systolic congestive heart failure 2/2 mixed ischemic and nonischemic cardiomyopathy #Coronary atherosclerosis Ms. Concepcion's shortness of breath is Partially improved today after diuresis with a good net negative fluid balance. Her ambulatory oxygen saturation was pretty good but she still feeling worse work of breathing with exertion than usual. I think getting her closer to a euvolemic state we'll keep improving the symptoms that she may be ready to leave and continue oral Lasix outpatient as early as tomorrow. We will transition to oral furosemide today PM. We're replacing magnesium and potassium with close monitoring of these labs.  #Hyponatremia Sodium of 126 on presentation, now 128. This probably multifactorial in the setting of a patient who is volume overloaded, with stage III chronic renal disease, and high doses of atypical antipsychotics. Hopefully this trend upward with improving her venous congestion. We'll follow up with  twice a day sodium monitoring while continuing diuresis.  #Chronic renal disease, stage 3 Baseline may be around 1.4-1.7. 1.63 today not in acute exacerbation and we are monitoring.  Dispo: Anticipated discharge in approximately 1-2 day(s).   Collier Salina, MD PGY-II Internal Medicine Resident Pager# [phone] 03/10/2017, 3:41 PM

## 2017-03-10 NOTE — Progress Notes (Signed)
Subjective: [name] reports feeling better this morning. She was able to sleep through the night without much shortness of breath. She denies chest pain or cough.  Objective: Vital signs in last 24 hours: Vitals:   03/09/17 1600 03/09/17 2211 03/10/17 0553 03/10/17 0943  BP: 119/76 117/63 101/64   Pulse: 100 97 88 88  Resp:  18 17 17   Temp:  97.8 F (36.6 C) 98 F (36.7 C)   TempSrc:  Oral Oral   SpO2: 99% 100% 96% 98%  Weight:   57.5 kg (126 lb 11.2 oz)   Height:       Weight change: -1.3 since yesterday (58.8 to 57.5; last recorded in clinic six months ago as 54.4)  Intake/Output Summary (Last 24 hours) at 03/10/17 1011 Last data filed at 03/09/17 2114  Gross per 24 hour  Intake              480 ml  Output             3050 ml  Net            -2570 ml   Physical Exam General: Sleepy-appearing female lying in bed. HEENT: Head normocephalic. Extraocular movements grossly intact. Moist mucus membranes. Cardiovascular: Regular rate and rhythm. Holosystolic murmur over apex. Respiratory: Normal work of breathing on 2L nasal cannula; removed during exam and still normal work of breathing. Bibasilar crackles milder than previously. Abdomen: Soft, non-tender. Extremities: Warm and dry. 1+ lower extremity edema to 2+ above the ankle bilaterally.  Lab Results: Basic Metabolic Panel:  Recent Labs Lab 03/10/17 0016 03/10/17 0427  NA 127* 128*  K 4.3 3.7  CL 90* 92*  CO2 25 23  GLUCOSE 126* 117*  BUN 17 17  CREATININE 1.75* 1.63*  CALCIUM 9.9 9.2  MG  --  1.7   Liver Function Tests:  Recent Labs Lab 03/09/17 1146  AST 52*  ALT 37  ALKPHOS 83  BILITOT 0.5  PROT 6.6  ALBUMIN 3.5   CBC:  Recent Labs Lab 03/09/17 1146 03/10/17 0427  WBC 5.9 5.2  HGB 10.4* 10.2*  HCT 30.4* 30.6*  MCV 80.4 79.3  PLT 239 289   Coagulation:  Recent Labs Lab 03/09/17 1146  LABPROT 11.8  INR 0.87   Studies/Results: Dg Chest Portable 1 View  Result Date:  03/09/2017 CLINICAL DATA:  Shortness of Breath EXAM: PORTABLE CHEST 1 VIEW COMPARISON:  08/09/2016 FINDINGS: There is hyperinflation of the lungs compatible with COPD. Small bilateral pleural effusions. There is diffuse interstitial prominence throughout the lungs which may reflect interstitial edema. More confluent edema or consolidation in the lower lobes/lung bases. IMPRESSION: COPD. Diffuse interstitial prominence may reflect interstitial edema. There are small bilateral pleural effusions with bibasilar atelectasis, infiltrates or edema. Electronically Signed   By: Rolm Baptise M.D.   On: 03/09/2017 11:47   Assessment/Plan: Problem List: -- Acute on chronic systolic congestive heart failure secondary to mixed ischemic and nonischemic cardiomyopathy -- Hyponatremia -- CAD -- Carotid stenosis -- Severe chronic mood disorder -- Stage III CKD  Assessment and Plan By Problem: #Acute on chronic systolic congestive heart failure secondary to mixed ischemic and nonischemic cardiomyopathy Unable to tolerate medications for optimal heart failure management due to symptomatic orthostatic hypotension. K was 3.7 and Mg was 1.7 when last checked 5/8. -- Continue IV Lasix 40 mg twice daily -- Encourage ambulation -- Daily weights, Is and Os -- Closely monitor K and Mg and replete such that K>4 and Mg>2  #Hyponatremia  Sodium 126 upon arrival; q6h rechecks were stable at 126, 127, and 128 respectively. -- Continue to closely monitor with recheck q12h to ensure that hyponatremia does not worsen with diuresis -- Consider salt tablets or hypertonic saline for sodium <120  #CAD #Carotid stenosis -- Continue home dual antiplatelet therapy  #Severe chronic mood disorder -- Continue home medications  #Stage III CKD Creatinine 1.69 upon admission. Rechecks 1.75 and 1.63. Has ranged between 1.4 and 1.7 over the past two years. -- Continue to closely monitor creatinine for worsening renal function in the  setting of diuresis  PPX: SCDs FENGI: 2035mL fluid restriction Code: Full Dispo: Anticipate discharge in 1-2 days pending improvement in fluid status.  This is a Careers information officer Note.  The care of the patient was discussed with Dr. Benjamine Mola and the assessment and plan formulated with their assistance.  Please see their attached note for official documentation of the daily encounter.   LOS: 1 day   Madelynn Done, Medical Student 03/10/2017, 10:11 AM

## 2017-03-10 NOTE — Progress Notes (Signed)
Internal Medicine Attending:   I saw and examined the patient. I reviewed the resident's note and I agree with the resident's findings and plan as documented in the resident's note.  66 year old woman hospital day #2 with acute on chronic heart failure with reduced ejection fraction. She is responding well to diuresis, continue with IV Lasix today. Renal function and hyponatremia are stable. Needs out of bed activity, PT and OT consultations. Not a candidate for beta blocker or ACE inhibitor because of orthostatic hypotension.

## 2017-03-10 NOTE — Progress Notes (Signed)
SATURATION QUALIFICATIONS: (This note is used to comply with regulatory documentation for home oxygen)  Patient Saturations on Room Air at Rest = 97%  Patient Saturations on Room Air while Ambulating = 90%  Patient Saturations on 2 Liters of oxygen while Ambulating = 100 %  Pt ambulated 250 feet, tolerated well  Caroline [name]

## 2017-03-11 ENCOUNTER — Other Ambulatory Visit: Payer: Medicare Other

## 2017-03-11 DIAGNOSIS — G8929 Other chronic pain: Secondary | ICD-10-CM

## 2017-03-11 DIAGNOSIS — M545 Low back pain: Secondary | ICD-10-CM

## 2017-03-11 DIAGNOSIS — K219 Gastro-esophageal reflux disease without esophagitis: Secondary | ICD-10-CM

## 2017-03-11 DIAGNOSIS — F319 Bipolar disorder, unspecified: Secondary | ICD-10-CM

## 2017-03-11 DIAGNOSIS — F418 Other specified anxiety disorders: Secondary | ICD-10-CM

## 2017-03-11 LAB — BASIC METABOLIC PANEL
Anion gap: 9 (ref 5–15)
Anion gap: 8 (ref 5–15)
BUN: 21 mg/dL — AB (ref 6–20)
BUN: 24 mg/dL — AB (ref 6–20)
CHLORIDE: 90 mmol/L — AB (ref 101–111)
CHLORIDE: 91 mmol/L — AB (ref 101–111)
CO2: 27 mmol/L (ref 22–32)
CO2: 27 mmol/L (ref 22–32)
CREATININE: 1.89 mg/dL — AB (ref 0.44–1.00)
Calcium: 8.5 mg/dL — ABNORMAL LOW (ref 8.9–10.3)
Calcium: 9 mg/dL (ref 8.9–10.3)
Creatinine, Ser: 1.99 mg/dL — ABNORMAL HIGH (ref 0.44–1.00)
GFR calc Af Amer: 32 mL/min — ABNORMAL LOW (ref >60)
GFR calc Af Amer: 30 mL/min — ABNORMAL LOW (ref >60)
GFR calc non Af Amer: 27 mL/min — ABNORMAL LOW (ref >60)
GFR calc non Af Amer: 26 mL/min — ABNORMAL LOW (ref >60)
GLUCOSE: 89 mg/dL (ref 65–99)
Glucose, Bld: 95 mg/dL (ref 65–99)
POTASSIUM: 4.6 mmol/L (ref 3.5–5.1)
Potassium: 4.8 mmol/L (ref 3.5–5.1)
Sodium: 126 mmol/L — ABNORMAL LOW (ref 135–145)
Sodium: 126 mmol/L — ABNORMAL LOW (ref 135–145)

## 2017-03-11 LAB — MAGNESIUM: Magnesium: 2.2 mg/dL (ref 1.7–2.4)

## 2017-03-11 MED ORDER — MIDODRINE HCL 5 MG PO TABS
5.0000 mg | ORAL_TABLET | Freq: Three times a day (TID) | ORAL | Status: DC
  Administered 2017-03-11 (×2): 5 mg via ORAL
  Filled 2017-03-11 (×2): qty 1

## 2017-03-11 MED ORDER — RAMELTEON 8 MG PO TABS
8.0000 mg | ORAL_TABLET | Freq: Every day | ORAL | Status: DC
  Administered 2017-03-11: 8 mg via ORAL
  Filled 2017-03-11 (×2): qty 1

## 2017-03-11 MED ORDER — FUROSEMIDE 40 MG PO TABS: 40.0000 mg | ORAL_TABLET | Freq: Every day | ORAL | 0 refills | Status: DC

## 2017-03-11 MED ORDER — FUROSEMIDE 40 MG PO TABS
40.0000 mg | ORAL_TABLET | Freq: Every day | ORAL | Status: DC
  Administered 2017-03-11: 40 mg via ORAL

## 2017-03-11 NOTE — Progress Notes (Signed)
MD at bedside aware of pt low blood pressures and aware of morning medications, MD stated to give all medications. No new orders at this time. Wil continue to monitor  Caroline [name]

## 2017-03-11 NOTE — Progress Notes (Signed)
Called pt daughter, aware of discharge, she stated she was on her way to pick up pt.  Kerriann Kamphuis Leory Plowman

## 2017-03-11 NOTE — Progress Notes (Signed)
Pt IV discontinued, catheter intact, pt telemetry removed. Pt has all belongings packed up. Awaiting transportation to provided discharge teaching  Rhenda [place] Leory Plowman

## 2017-03-11 NOTE — Progress Notes (Signed)
Talked to resident in hallway in regards to pt low blood pressure and pt ambulation saturations. Resident aware pt states she has difficulty breathing when removal of nasal cannula. Resident at bedside. No new orders at this time. Will continue to monitor   Caroline [name]

## 2017-03-11 NOTE — Progress Notes (Signed)
Transitions of Care Pharmacy Note  Plan:  Educated on Lasix dose chang, midodrine, and fluid restriction  Addressed concerns regarding want for inhaler  --------------------------------------------- Caroline [name] is an 66 y.o. female who presents with a chief complaint of SOB. In anticipation of discharge, pharmacy has reviewed this patient's prior to admission medication history, as well as current inpatient medications listed per the [facility].  Current medication indications, dosing, frequency, and notable side effects reviewed with patient. Patient verbalized understanding of current inpatient medication regimen and is aware that the After Visit Summary when presented, will represent the most accurate medication list at discharge.   ZAKARIA FROMER expressed concerns regarding the need/want of an inhaler to help her feel like she's not "suffocating". I asked if she is on an inhaler at home, which she replied no to. She does not have a listed respiratory condition in her PMH. We discussed that the reason she feels short of breath has to do with excess fluid, and that an inhaler would not fix the underlying issue. I explained that adding on additional medications that do not treat the problem may cause unnecessary side effects. She stated understanding to the best of her ability.   Assessment: Understanding of regimen: poor Understanding of indications: poor Potential of compliance: poor Barriers to Obtaining Medications: No  Patient instructed to contact inpatient pharmacy team with further questions or concerns if needed.    Time spent preparing for discharge counseling: 10 min Time spent counseling patient: 10 min   Thank you for allowing pharmacy to be a part of this patient's care.  Belia Heman, PharmD PGY1 Resident 03/11/2017 6:04 PM

## 2017-03-11 NOTE — Progress Notes (Signed)
Pt discharge education provided at bedside with pt and pt daughter. MD provided printed prescription for pt lasix order. Pt has all belongings including discharge paper work and printed prescriptions. Pt discharged via wheelchair with RN and family member  Rooney Gladwin Leory Plowman

## 2017-03-11 NOTE — Progress Notes (Signed)
   Subjective: Caroline [name] had an episode of dyspnea and anxiety overnight and received a dose of ramelteon. Afterwards she slept without interruption. She was able to walk with PT without hypoxia yesterday but had not gotten out of bed yet this morning. She requested supplemental oxygen be restarted several times although no hypoxia was noted on continuous monitoring.  Objective:  Vital signs in last 24 hours: Vitals:   03/10/17 2207 03/11/17 0625 03/11/17 0809 03/11/17 0900  BP: (!) 95/55 (!) 85/57 (!) 93/56 (!) 94/52  Pulse: 90 86 73 74  Resp: 18 18 16 16   Temp: 97.3 F (36.3 C) 98.3 F (36.8 C) 97.6 F (36.4 C)   TempSrc: Oral Oral Oral   SpO2: 99% 98% 99% 100%  Weight:  126 lb 1.6 oz (57.2 kg)    Height:       GENERAL- resting in bed comfortably in no distress CARDIAC- RRR, holosystolic murmur RESP- CTAB, normal WOB EXTREMITIES- Symmetric, no pitting edema in legs PSYCH- Flat affect  Assessment/Plan: #Acute on chronic systolic congestive heart failure 2/2 mixed ischemic and nonischemic cardiomyopathy #Coronary atherosclerosis She has responded well to IV diuresis with improvement of her pulmonary edema on physical exam today. She is not experiencing significant hypoxia so based on last trial would not qualify for home oxygen therapy. I suspect most of her dyspnea is related to poor cardiac function and anxiety rather than a pulmonary process at this point  #Hyponatremia Sodium of 126 unchanged from admission. This is probably multifactorial with cardiac, renal, and psychiatric disease. We will recommend fluid restriction to continue after discharge and she will need close follow up for laboratory monitoring.  #Chronic renal disease, stage 3 Baseline may be around 1.4-1.7, 1.86 today possibly elevated but she has maintained a good UOP. Will anticipate a discharged on 40mg  daily lasix.  Dispo: Anticipate discharge home today in the afternoon after repeating ambulation with  close monitoring and discussing with family.  Collier Salina, MD PGY-II Internal Medicine Resident Pager# [phone] 03/11/2017, 11:40 AM

## 2017-03-11 NOTE — Progress Notes (Signed)
Asked MD if wanted a physical therapy evaluation prior to discharge, MD agreed, order placed  Jusitn Salsgiver Leory Plowman

## 2017-03-11 NOTE — Evaluation (Addendum)
Physical Therapy Evaluation and Discharge Summary Patient Details Name: Caroline [name] MRN: [id number] DOB: [birth year] Today's Date: 03/11/2017   History of Present Illness  Pt is a 66 y/o female admitted secondary to SOB with acute on chronic heart failure secondary to mixed ischemic and non-ischemic cardiomyopathy. PMH including but not limited to a-fib, bipolar disorder, CAD, CHF, CKD, Grave's disease and HTN.  Clinical Impression  Pt presented supine in bed with HOB elevated, awake and willing to participate in therapy session. Prior to admission, pt reported that she was independent with all functional mobility and ADLs. Pt stated that she has a friend that does her grocery shopping and drives her to doctor's appointments. Pt ambulated in hallway with min guard on RA with SPO2 maintaining >94% throughout. Pt did not report any SOB and did not have any increased WOB. No further acute PT needs identified at this time. PT signing off.    Follow Up Recommendations Home health PT    Equipment Recommendations  None recommended by PT    Recommendations for Other Services       Precautions / Restrictions Precautions Precautions: None Restrictions Weight Bearing Restrictions: No      Mobility  Bed Mobility Overal bed mobility: Modified Independent                Transfers Overall transfer level: Modified independent Equipment used: None                Ambulation/Gait Ambulation/Gait assistance: Min guard Ambulation Distance (Feet): 100 Feet (100' x2 with sitting rest break) Assistive device: None Gait Pattern/deviations: Step-through pattern;Decreased step length - right;Decreased step length - left;Decreased stride length;Shuffle Gait velocity: decreased Gait velocity interpretation: Below normal speed for age/gender General Gait Details: shuffling gait pattern, mild instability but no LOB or need for physical assistance, min guard for safety  Stairs             Wheelchair Mobility    Modified Rankin (Stroke Patients Only)       Balance Overall balance assessment: Needs assistance Sitting-balance support: Feet supported Sitting balance-Leahy Scale: Good     Standing balance support: During functional activity;No upper extremity supported Standing balance-Leahy Scale: Fair                               Pertinent Vitals/Pain Pain Assessment: No/denies pain    Home Living Family/patient expects to be discharged to:: Private residence Living Arrangements: Alone Available Help at Discharge: Family;Friend(s);Available PRN/intermittently Type of Home: House Home Access: Stairs to enter   Entrance Stairs-Number of Steps: 6 Home Layout: Two level;Bed/bath upstairs Home Equipment: Walker - 2 wheels;Cane - single point      Prior Function Level of Independence: Independent         Comments: pt reports that she has a friend that does her grocery shopping and drives her to doctor's appointment     Hand Dominance        Extremity/Trunk Assessment   Upper Extremity Assessment Upper Extremity Assessment: Generalized weakness    Lower Extremity Assessment Lower Extremity Assessment: Generalized weakness       Communication   Communication: No difficulties  Cognition Arousal/Alertness: Awake/alert Behavior During Therapy: Flat affect Overall Cognitive Status: Within Functional Limits for tasks assessed  General Comments: pt alert and oriented, following commands appropriately throughout      General Comments      Exercises     Assessment/Plan    PT Assessment Patent does not need any further PT services  PT Problem List         PT Treatment Interventions      PT Goals (Current goals can be found in the Care Plan section)  Acute Rehab PT Goals Patient Stated Goal: return home    Frequency     Barriers to discharge        Co-evaluation                AM-PAC PT "6 Clicks" Daily Activity  Outcome Measure Difficulty turning over in bed (including adjusting bedclothes, sheets and blankets)?: None Difficulty moving from lying on back to sitting on the side of the bed? : None Difficulty sitting down on and standing up from a chair with arms (e.g., wheelchair, bedside commode, etc,.)?: None Help needed moving to and from a bed to chair (including a wheelchair)?: None Help needed walking in hospital room?: A Little Help needed climbing 3-5 steps with a railing? : A Little 6 Click Score: 22    End of Session   Activity Tolerance: Patient tolerated treatment well Patient left: in bed;with call bell/phone within reach Nurse Communication: Mobility status PT Visit Diagnosis: Other abnormalities of gait and mobility (R26.89)    Time: 3888-7579 PT Time Calculation (min) (ACUTE ONLY): 23 min   Charges:   PT Evaluation $PT Eval Moderate Complexity: 1 Procedure PT Treatments $Gait Training: 8-22 mins   PT G Codes:        [place], PT, DPT [place] 03/11/2017, 4:36 PM

## 2017-03-11 NOTE — Discharge Summary (Signed)
Name: Caroline [name] MRN: [id number] DOB: October 22, 1951 66 y.o. PCP: Patient, No Pcp Per  Date of Admission: 03/09/2017 10:28 AM Date of Discharge: 03/13/2017 Attending Physician: No att. providers found  Discharge Diagnosis: Principal Problem:   Acute on chronic heart failure (Caroline [name]) Active Problems:   ANXIETY DEPRESSION   Coronary atherosclerosis   GERD   Chronic low back pain   Chronic renal disease, stage 3, moderately decreased glomerular filtration rate (GFR) between 30-59 mL/min/1.73 square meter   Bipolar 1 disorder, depressed, partial remission ([place])   Discharge Medications: Allergies as of 03/11/2017   No Known Allergies     Medication List    TAKE these medications   aspirin 81 MG tablet Take 1 tablet (81 mg total) by mouth daily.   atorvastatin 40 MG tablet Commonly known as:  LIPITOR Take 1 tablet (40 mg total) by mouth daily at 6 PM.   Black Cohosh 540 MG Caps Take 1 capsule (540 mg total) by mouth 2 (two) times daily.   clonazePAM 1 MG tablet Commonly known as:  KLONOPIN Take 1 tablet (1 mg total) by mouth 2 (two) times daily. What changed:  when to take this   clopidogrel 75 MG tablet Commonly known as:  PLAVIX Take ONE (1) tablet by mouth once daily   feeding supplement (ENSURE ENLIVE) Liqd Take 237 mLs by mouth 2 (two) times daily after a meal.   ferrous sulfate 325 (65 FE) MG tablet Take 325 mg by mouth daily with breakfast.   furosemide 40 MG tablet Commonly known as:  LASIX Take 1 tablet (40 mg total) by mouth daily. What changed:  medication strength  how much to take   midodrine 5 MG tablet Commonly known as:  PROAMATINE Take 1 tablet (5 mg total) by mouth 3 (three) times daily with meals.   nitroGLYCERIN 0.4 MG SL tablet Commonly known as:  NITROSTAT Place 1 tablet (0.4 mg total) under the tongue every 5 (five) minutes as needed for chest pain.   QUEtiapine 400 MG tablet Commonly known as:  SEROQUEL Take 400 mg by mouth at  bedtime.   QUEtiapine 300 MG tablet Commonly known as:  SEROQUEL Take 300 mg by mouth 2 (two) times daily.   risperiDONE 2 MG tablet Commonly known as:  RISPERDAL Take 2 mg by mouth 3 (three) times daily.       Disposition and follow-up:   [name] was discharged from [facility] in Good condition.  At the hospital follow up visit please address:  1.  Chronic heart failure with reduced ejection fraction: [name] was started on 40mg  daily lasix at discharge after previous treatment on 20mg  PRN which she had not taken for 3 weeks prior to admission. She was discharged at last recorded weight 57.2kg and last clinic recorded weight 54.4kg. Please ask about orthostatic hypotension if she might be overdiuresed.  2.  Hyponatremia: This is probably multifactorial with diuretics, heart failure, renal disease, and psychiatric medications and she would benefit with encouraging free water restriction.  3.  Labs / imaging needed at time of follow-up: Basic metabolic panel   Follow-up Appointments: Follow-up Information    [facility]. Go on 03/19/2017.   Why:  @0800  Contact information: [place] [id number] [place] [facility] Course by problem list: 1. Acute on chronic systolic congestive heart failure: Caroline [name] presented to the ED  on 5/7 for three-weeks of worsening dyspnea after running out of her PRN Lasix 20mg . She was found to have bibasilar crackles, pitting edema, and a weight of 58.8kg up from her most recently recorded clinic weight of 54.4kg. BNP was 1,146.8 up from 275.8 in 06/2016. Chest x-ray demonstrated edema and bilateral effusions. Previous TEE from 07/2016 was reviewed showing estimated LVEF 25-30% with severe mitral regurgitation. She was started on Lasix 40mg  IV BID with improvement in her symptoms and physical exam findings. Her weight  decreased to 57.2kg during this admission. She was transitioned to oral Lasix 40mg  once daily on 5/9 in preparation for discharge home. She maintained normal oxygen saturations while walking for 2 days prior to discharge.  2. Hyponatremia Sodium upon admission 126; this was carefully monitored for worsening every 6 hours during the initial 24 hours of diuresis and every 12 hours thereafter and remained stable. This appears to be chronic since 08/2016 and was felt to be multifactorial. Sodium did not improve with fluid restriction to 2089mL/day. She was counseled on free water restriction upon discharge.  3. Chronic renal disease, stage 3 Creatinine was monitored during diuresis and remained stable between 1.6 and 1.9 during admission with good urine output.  Discharge Vitals:   BP 105/61 (BP Location: Left Arm)   Pulse 83   Temp 97.6 F (36.4 C) (Oral)   Resp 16   Ht 5\' 3"  (1.6 m)   Wt 126 lb 1.6 oz (57.2 kg)   SpO2 100%   BMI 22.34 kg/m   Pertinent Labs, Studies, and Procedures:  CXR 5/7 demonstrated pulmonary edema and small bilateral pleural effusions consistent with congestive heart failure  Discharge Instructions: Discharge Instructions    Face-to-face encounter (required for Medicare/Medicaid patients)    Complete by:  As directed    I Hinton Lovely certify that this patient is under my care and that I, or a nurse practitioner or physician's assistant working with me, had a face-to-face encounter that meets the physician face-to-face encounter requirements with this patient on 03/11/2017. The encounter with the patient was in whole, or in part for the following medical condition(s) which is the primary reason for home health care (List medical condition): Acute on chronic heart failure with shortness of breath on exertion   The encounter with the patient was in whole, or in part, for the following medical condition, which is the primary reason for home health care:  heart failure  exacerbation   I certify that, based on my findings, the following services are medically necessary home health services:  Physical therapy   Reason for Medically Necessary Home Health Services:  Therapy- Therapeutic Exercises to Increase Strength and Endurance   My clinical findings support the need for the above services:  Shortness of breath with activity   Further, I certify that my clinical findings support that this patient is homebound due to:  Shortness of Breath with activity   Home Health    Complete by:  As directed    To provide the following care/treatments:  PT      Signed: Collier Salina, MD PGY-II Internal Medicine Resident Pager# [phone] 03/13/2017, 6:49 AM

## 2017-03-11 NOTE — Progress Notes (Signed)
SATURATION QUALIFICATIONS: (This note is used to comply with regulatory documentation for home oxygen)  Patient Saturations on Room Air at Rest = 97%  Patient Saturations on Room Air while Ambulating = 94%  Patient Saturations on Liters of oxygen while Ambulating = NA  Please briefly explain why patient needs home oxygen: Pt does not desaturate with ambulation on RA.  Pt ambulated 100' x2 with no AD, min guard for safety, on RA with SPO2 maintaining >94% throughout.  [place], [place], [place] [phone]

## 2017-03-11 NOTE — Progress Notes (Signed)
Subjective: Caroline [name] is doing well this morning. She reports some anxiety about going home, stating: "I don't want to go home without oxygen or an inhaler because it's scary to feel out of breath." She is not currently experiencing shortness of breath and denies cough. She did some walking in the hallway yesterday without getting short of breath. Her nurse notes that she panics when her nasal cannula is removed, although her oxygen saturations have been in normal range.  Objective: Vital signs in last 24 hours: Vitals:   03/10/17 2207 03/11/17 0625 03/11/17 0809 03/11/17 0900  BP: (!) 95/55 (!) 85/57 (!) 93/56 (!) 94/52  Pulse: 90 86 73 74  Resp: 18 18 16 16   Temp: 97.3 F (36.3 C) 98.3 F (36.8 C) 97.6 F (36.4 C)   TempSrc: Oral Oral Oral   SpO2: 99% 98% 99% 100%  Weight:  57.2 kg (126 lb 1.6 oz)    Height:       Weight change: -1.601 kg (-3 lb 8.5 oz)  Physical Exam General: Alert female lying in bed. HEENT: Head normocephalic. Extraocular movements grossly intact. Moist mucus membranes. Cardiovascular: Regular rate and rhythm. Holosystolic murmur over apex. Respiratory: Normal work of breathing on room air. Clear to auscultation bilaterally. Abdomen: Soft, non-tender. Extremities: Warm and dry. No lower extremity edema.  Lab Results: Basic Metabolic Panel:  Recent Labs Lab 03/10/17 0427 03/10/17 1543 03/11/17 0416  NA 128* 129* 126*  K 3.7 3.8 4.8  CL 92* 92* 90*  CO2 23 27 27   GLUCOSE 117* 93 95  BUN 17 22* 21*  CREATININE 1.63* 1.82* 1.89*  CALCIUM 9.2 9.0 8.5*  MG 1.7  --  2.2   CBC:  Recent Labs Lab 03/09/17 1146 03/10/17 0427  WBC 5.9 5.2  HGB 10.4* 10.2*  HCT 30.4* 30.6*  MCV 80.4 79.3  PLT 239 289   Assessment/Plan: Problem List: -- Acute on chronic systolic congestive heart failure -- Hyponatremia -- CAD -- Carotid stenosis -- Severe chronic mood disorder -- Stage III CKD  Assessment and Plan By Problem: #Acute on chronic systolic  congestive heart failure Patient is diuresing well and feels better. Shortness of breath has improved. Crackles and lower extremity edema have resolved. K up to 4.8, Mg up to 2.2. Sodium down to 126. Creatinine 1.89, BUN 21. Given improvement in symptoms and physical examination, as well as her persistent hyponatremia, will decrease dose of Lasix to 40mg  once daily and transition to oral as opposed to IV in anticipation of discharge. Prior to discharge, will encourage ambulation and she will have a PT evaluation. She is unable to tolerate medications for optimal heart failure management due to symptomatic orthostatic hypotension. Counseled her regarding the lack of benefit from home oxygen/inhalers in an attempt to dissuade her fears about going home without them. Will reassure further this afternoon.  #Hyponatremia Sodium 126 upon arrival; q6h rechecks during initial diuresis were stable at 126, 127, and 128 respectively. Sodium today down to 126. Likely multifactorial, with antipsychotics having the potential to cause dry mouth and increased free water intake and/or SIADH as well as the patient's underlying CKD. Current diuresis could certainly also be a contributing factor. Will encourage free water restriction to 1500 mL upon discharge with close outpatient follow-up.  #CAD #Carotid stenosis -- Continue home dual antiplatelet therapy  #Severe chronic mood disorder -- Continue home medications  #Stage III CKD Creatinine 1.69 upon admission. Has ranged between 1.4 and 1.7 over the past two years. Today 1.89.  Current diuresis may be contributing to this slight upward shift. Will continue to monitor closely.  PPX: SCDs FENGI: 152mL fluid restriction Code: Full Dispo: Given improvement in symptoms and physical exam, patient is stable for discharge today.  This is a Careers information officer Note.  The care of the patient was discussed with Dr. Benjamine Mola and the assessment and plan formulated with their  assistance.  Please see their attached note for official documentation of the daily encounter.   LOS: 2 days   Madelynn Done, Medical Student 03/11/2017, 11:00 AM

## 2017-03-12 ENCOUNTER — Telehealth: Payer: Self-pay | Admitting: *Deleted

## 2017-03-12 NOTE — Telephone Encounter (Signed)
-----   Message from Eileen Stanford, PA-C sent at 03/12/2017  2:13 PM EDT ----- Can you call her and tell her the appointment with me next Thursday is cancelled (and cancel it). She is supposed to follow with Aundra Dubin in the heart failure clinic. I made her an appointment on Friday at 10:30 with andy tillery and mclean is there that day. The garage code in [place]. Thanks!!!

## 2017-03-12 NOTE — Telephone Encounter (Signed)
Called pt, per Nell Range, PA-C, to advise her that the appt with Nell Range, PA-C 5/117/18 due to that she is seeing CHF 03/20/17. Pt aware and verbalized understanding

## 2017-03-13 ENCOUNTER — Telehealth (HOSPITAL_COMMUNITY): Payer: Self-pay

## 2017-03-13 NOTE — Telephone Encounter (Signed)
Patient's family member contacted CHF clinic triag eline to report patient not doing well since DC from hospital 2 days ago. Called patient to follow up. Patient has very flat affect and not engaged in phone conversation (Hx Bipolar, anxiety/depression), however does report she feels the same as the day she left the hospital, no worse or better. Denies swelling in legs/feet, SOB, or distention.  Does report just generally more fatigued. Patient unsure if she will be able to make appointment next week due to transportation issues. Will forward to CHF clinical SW Raquel Sarna to see how we can further assist patient with transportation and other needs.  Renee Pain, RN

## 2017-03-16 ENCOUNTER — Telehealth (HOSPITAL_COMMUNITY): Payer: Self-pay | Admitting: Vascular Surgery

## 2017-03-16 ENCOUNTER — Telehealth: Payer: Self-pay | Admitting: Licensed Clinical Social Worker

## 2017-03-16 NOTE — Telephone Encounter (Signed)
Pt called to cancel appt , pt has cancel the last 4 appt that was scheduled she wants to continue to see her original heart doctor she does not want to be scheduled over her again

## 2017-03-16 NOTE — Telephone Encounter (Signed)
CSW received referral to assit with transportation to HF clinic appointment. CSW attempted to contact patient with no answer and message left. Raquel Sarna, [place], [place]

## 2017-03-18 ENCOUNTER — Inpatient Hospital Stay (HOSPITAL_COMMUNITY): Payer: Self-pay

## 2017-03-19 ENCOUNTER — Ambulatory Visit: Payer: Medicare Other | Admitting: Physician Assistant

## 2017-03-20 ENCOUNTER — Inpatient Hospital Stay (HOSPITAL_COMMUNITY): Payer: Self-pay

## 2017-04-01 ENCOUNTER — Ambulatory Visit: Payer: Medicare Other | Admitting: Physician Assistant

## 2017-04-08 ENCOUNTER — Telehealth: Payer: Self-pay | Admitting: Cardiovascular Disease

## 2017-04-08 ENCOUNTER — Emergency Department (HOSPITAL_COMMUNITY): Payer: Medicare Other

## 2017-04-08 ENCOUNTER — Inpatient Hospital Stay (HOSPITAL_COMMUNITY): Payer: Medicare Other

## 2017-04-08 ENCOUNTER — Encounter (HOSPITAL_COMMUNITY): Payer: Self-pay | Admitting: Emergency Medicine

## 2017-04-08 ENCOUNTER — Inpatient Hospital Stay (HOSPITAL_COMMUNITY)
Admission: EM | Admit: 2017-04-08 | Discharge: 2017-04-13 | DRG: 291 | Disposition: A | Discharge status: Home Health | Payer: Medicare Other | Attending: Student in an Organized Health Care Education/Training Program | Admitting: Student in an Organized Health Care Education/Training Program

## 2017-04-08 DIAGNOSIS — E871 Hypo-osmolality and hyponatremia: Secondary | ICD-10-CM | POA: Diagnosis present

## 2017-04-08 DIAGNOSIS — E039 Hypothyroidism, unspecified: Secondary | ICD-10-CM | POA: Diagnosis present

## 2017-04-08 DIAGNOSIS — F319 Bipolar disorder, unspecified: Secondary | ICD-10-CM | POA: Diagnosis present

## 2017-04-08 DIAGNOSIS — I130 Hypertensive heart and chronic kidney disease with heart failure and stage 1 through stage 4 chronic kidney disease, or unspecified chronic kidney disease: Secondary | ICD-10-CM | POA: Diagnosis not present

## 2017-04-08 DIAGNOSIS — Z7982 Long term (current) use of aspirin: Secondary | ICD-10-CM | POA: Diagnosis not present

## 2017-04-08 DIAGNOSIS — Z7902 Long term (current) use of antithrombotics/antiplatelets: Secondary | ICD-10-CM

## 2017-04-08 DIAGNOSIS — R791 Abnormal coagulation profile: Secondary | ICD-10-CM | POA: Diagnosis not present

## 2017-04-08 DIAGNOSIS — F418 Other specified anxiety disorders: Secondary | ICD-10-CM | POA: Diagnosis present

## 2017-04-08 DIAGNOSIS — F341 Dysthymic disorder: Secondary | ICD-10-CM | POA: Diagnosis present

## 2017-04-08 DIAGNOSIS — I5023 Acute on chronic systolic (congestive) heart failure: Secondary | ICD-10-CM | POA: Diagnosis present

## 2017-04-08 DIAGNOSIS — Z955 Presence of coronary angioplasty implant and graft: Secondary | ICD-10-CM

## 2017-04-08 DIAGNOSIS — F39 Unspecified mood [affective] disorder: Secondary | ICD-10-CM | POA: Diagnosis not present

## 2017-04-08 DIAGNOSIS — E876 Hypokalemia: Secondary | ICD-10-CM | POA: Diagnosis present

## 2017-04-08 DIAGNOSIS — Z9114 Patient's other noncompliance with medication regimen: Secondary | ICD-10-CM | POA: Diagnosis not present

## 2017-04-08 DIAGNOSIS — Z87891 Personal history of nicotine dependence: Secondary | ICD-10-CM | POA: Diagnosis not present

## 2017-04-08 DIAGNOSIS — J90 Pleural effusion, not elsewhere classified: Secondary | ICD-10-CM | POA: Diagnosis not present

## 2017-04-08 DIAGNOSIS — F419 Anxiety disorder, unspecified: Secondary | ICD-10-CM | POA: Diagnosis not present

## 2017-04-08 DIAGNOSIS — Z79899 Other long term (current) drug therapy: Secondary | ICD-10-CM | POA: Diagnosis not present

## 2017-04-08 DIAGNOSIS — Z8042 Family history of malignant neoplasm of prostate: Secondary | ICD-10-CM

## 2017-04-08 DIAGNOSIS — J9811 Atelectasis: Secondary | ICD-10-CM | POA: Diagnosis present

## 2017-04-08 DIAGNOSIS — Z66 Do not resuscitate: Secondary | ICD-10-CM

## 2017-04-08 DIAGNOSIS — E875 Hyperkalemia: Secondary | ICD-10-CM | POA: Diagnosis not present

## 2017-04-08 DIAGNOSIS — I6529 Occlusion and stenosis of unspecified carotid artery: Secondary | ICD-10-CM | POA: Diagnosis present

## 2017-04-08 DIAGNOSIS — I340 Nonrheumatic mitral (valve) insufficiency: Secondary | ICD-10-CM | POA: Diagnosis present

## 2017-04-08 DIAGNOSIS — L89151 Pressure ulcer of sacral region, stage 1: Secondary | ICD-10-CM | POA: Diagnosis present

## 2017-04-08 DIAGNOSIS — I2510 Atherosclerotic heart disease of native coronary artery without angina pectoris: Secondary | ICD-10-CM | POA: Diagnosis present

## 2017-04-08 DIAGNOSIS — I350 Nonrheumatic aortic (valve) stenosis: Secondary | ICD-10-CM | POA: Diagnosis not present

## 2017-04-08 DIAGNOSIS — K219 Gastro-esophageal reflux disease without esophagitis: Secondary | ICD-10-CM | POA: Diagnosis present

## 2017-04-08 DIAGNOSIS — Z96642 Presence of left artificial hip joint: Secondary | ICD-10-CM | POA: Diagnosis present

## 2017-04-08 DIAGNOSIS — I959 Hypotension, unspecified: Secondary | ICD-10-CM

## 2017-04-08 DIAGNOSIS — Z8349 Family history of other endocrine, nutritional and metabolic diseases: Secondary | ICD-10-CM

## 2017-04-08 DIAGNOSIS — Z800 Family history of malignant neoplasm of digestive organs: Secondary | ICD-10-CM

## 2017-04-08 DIAGNOSIS — E8770 Fluid overload, unspecified: Secondary | ICD-10-CM

## 2017-04-08 DIAGNOSIS — I951 Orthostatic hypotension: Secondary | ICD-10-CM | POA: Diagnosis present

## 2017-04-08 DIAGNOSIS — R570 Cardiogenic shock: Secondary | ICD-10-CM | POA: Diagnosis present

## 2017-04-08 DIAGNOSIS — R0602 Shortness of breath: Secondary | ICD-10-CM | POA: Diagnosis not present

## 2017-04-08 DIAGNOSIS — E785 Hyperlipidemia, unspecified: Secondary | ICD-10-CM | POA: Diagnosis present

## 2017-04-08 DIAGNOSIS — N183 Chronic kidney disease, stage 3 (moderate): Secondary | ICD-10-CM | POA: Diagnosis present

## 2017-04-08 DIAGNOSIS — Z602 Problems related to living alone: Secondary | ICD-10-CM | POA: Diagnosis not present

## 2017-04-08 DIAGNOSIS — Z8249 Family history of ischemic heart disease and other diseases of the circulatory system: Secondary | ICD-10-CM

## 2017-04-08 DIAGNOSIS — Z833 Family history of diabetes mellitus: Secondary | ICD-10-CM

## 2017-04-08 DIAGNOSIS — N189 Chronic kidney disease, unspecified: Secondary | ICD-10-CM | POA: Diagnosis present

## 2017-04-08 DIAGNOSIS — I509 Heart failure, unspecified: Secondary | ICD-10-CM

## 2017-04-08 DIAGNOSIS — L8990 Pressure ulcer of unspecified site, unspecified stage: Secondary | ICD-10-CM | POA: Insufficient documentation

## 2017-04-08 DIAGNOSIS — I9589 Other hypotension: Secondary | ICD-10-CM | POA: Diagnosis not present

## 2017-04-08 LAB — CBC WITH DIFFERENTIAL/PLATELET
BASOS ABS: 0.1 10*3/uL (ref 0.0–0.1)
Basophils Relative: 1 %
Eosinophils Absolute: 0.1 10*3/uL (ref 0.0–0.7)
Eosinophils Relative: 3 %
HCT: 29.5 % — ABNORMAL LOW (ref 36.0–46.0)
Hemoglobin: 9.7 g/dL — ABNORMAL LOW (ref 12.0–15.0)
LYMPHS PCT: 32 %
Lymphs Abs: 1.5 10*3/uL (ref 0.7–4.0)
MCH: 26.6 pg (ref 26.0–34.0)
MCHC: 32.9 g/dL (ref 30.0–36.0)
MCV: 80.8 fL (ref 78.0–100.0)
Monocytes Absolute: 0.7 10*3/uL (ref 0.1–1.0)
Monocytes Relative: 15 %
Neutro Abs: 2.4 10*3/uL (ref 1.7–7.7)
Neutrophils Relative %: 49 %
PLATELETS: 235 10*3/uL (ref 150–400)
RBC: 3.65 MIL/uL — ABNORMAL LOW (ref 3.87–5.11)
RDW: 15.5 % (ref 11.5–15.5)
WBC: 4.8 10*3/uL (ref 4.0–10.5)

## 2017-04-08 LAB — BASIC METABOLIC PANEL
Anion gap: 12 (ref 5–15)
BUN: 18 mg/dL (ref 6–20)
CALCIUM: 9.2 mg/dL (ref 8.9–10.3)
CO2: 30 mmol/L (ref 22–32)
CREATININE: 1.69 mg/dL — AB (ref 0.44–1.00)
Chloride: 82 mmol/L — ABNORMAL LOW (ref 101–111)
GFR calc non Af Amer: 31 mL/min — ABNORMAL LOW (ref >60)
GFR, EST AFRICAN AMERICAN: 36 mL/min — AB (ref >60)
GLUCOSE: 100 mg/dL — AB (ref 65–99)
Potassium: 3.3 mmol/L — ABNORMAL LOW (ref 3.5–5.1)
Sodium: 124 mmol/L — ABNORMAL LOW (ref 135–145)

## 2017-04-08 LAB — GLUCOSE, CAPILLARY: GLUCOSE-CAPILLARY: 111 mg/dL — AB (ref 65–99)

## 2017-04-08 LAB — ECHOCARDIOGRAM COMPLETE
AO mean calculated velocity dopler: 87 cm/s
AOPV: 0.67 m/s
AOVTI: 20.8 cm
AV Area VTI index: 1.11 cm2/m2
AV Area mean vel: 1.4 cm2
AV Mean grad: 4 mmHg
AV Peak grad: 7 mmHg
AV VEL mean LVOT/AV: 0.62
AV area mean vel ind: 0.88 cm2/m2
AV peak Index: 0.96
AVA: 1.78 cm2
AVAREAVTI: 1.53 cm2
AVPHT: 365 ms
AVPKVEL: 136 cm/s
Area-P 1/2: 3.55 cm2
CHL CUP AV VEL: 1.78
CHL CUP RV SYS PRESS: 44 mmHg
CHL CUP TV REG PEAK VELOCITY: 269 cm/s
E decel time: 187 msec
FS: 21 % — AB (ref 28–44)
IVS/LV PW RATIO, ED: 0.92
LA ID, A-P, ES: 50 mm
LA diam end sys: 50 mm
LA vol A4C: 62.4 ml
LA vol index: 52.9 mL/m2
LA vol: 84.6 mL
LADIAMINDEX: 3.13 cm/m2
LV SIMPSON'S DISK: 45
LV dias vol index: 116 mL/m2
LV dias vol: 185 mL — AB (ref 46–106)
LVOT SV: 37 mL
LVOT VTI: 16.3 cm
LVOT area: 2.27 cm2
LVOT diameter: 17 mm
LVOT peak grad rest: 3 mmHg
LVOTPV: 91.6 cm/s
LVOTVTI: 0.78 cm
LVSYSVOL: 101 mL — AB (ref 14–42)
LVSYSVOLIN: 63 mL/m2
MV Dec: 187
MV Peak grad: 7 mmHg
MV VTI: 133 cm
MV pk E vel: 129 m/s
MVSPHT: 56 ms
PISA EROA: 0.08 cm2
PW: 9.77 mm — AB (ref 0.6–1.1)
RV TAPSE: 12.9 mm
Stroke v: 84 ml
TRMAXVEL: 269 cm/s
Valve area index: 1.11

## 2017-04-08 LAB — URINALYSIS, ROUTINE W REFLEX MICROSCOPIC
Bilirubin Urine: NEGATIVE
GLUCOSE, UA: NEGATIVE mg/dL
Hgb urine dipstick: NEGATIVE
Ketones, ur: NEGATIVE mg/dL
LEUKOCYTES UA: NEGATIVE
Nitrite: NEGATIVE
PH: 8 (ref 5.0–8.0)
Protein, ur: NEGATIVE mg/dL
SPECIFIC GRAVITY, URINE: 1.002 — AB (ref 1.005–1.030)

## 2017-04-08 LAB — TSH: TSH: 4.854 u[IU]/mL — ABNORMAL HIGH (ref 0.350–4.500)

## 2017-04-08 LAB — D-DIMER, QUANTITATIVE: D-Dimer, Quant: 0.81 ug/mL-FEU — ABNORMAL HIGH (ref 0.00–0.50)

## 2017-04-08 LAB — MRSA PCR SCREENING: MRSA BY PCR: NEGATIVE

## 2017-04-08 LAB — BRAIN NATRIURETIC PEPTIDE: B Natriuretic Peptide: 2543 pg/mL — ABNORMAL HIGH (ref 0.0–100.0)

## 2017-04-08 LAB — I-STAT TROPONIN, ED: Troponin i, poc: 0.04 ng/mL (ref 0.00–0.08)

## 2017-04-08 MED ORDER — FUROSEMIDE 10 MG/ML IJ SOLN
40.0000 mg | Freq: Once | INTRAMUSCULAR | Status: AC
  Administered 2017-04-08: 40 mg via INTRAVENOUS
  Filled 2017-04-08: qty 4

## 2017-04-08 MED ORDER — RISPERIDONE 1 MG PO TABS
2.0000 mg | ORAL_TABLET | Freq: Three times a day (TID) | ORAL | Status: DC
  Administered 2017-04-08 – 2017-04-13 (×16): 2 mg via ORAL
  Filled 2017-04-08 (×7): qty 4
  Filled 2017-04-08 (×2): qty 2
  Filled 2017-04-08 (×8): qty 4

## 2017-04-08 MED ORDER — HEPARIN SODIUM (PORCINE) 5000 UNIT/ML IJ SOLN
5000.0000 [IU] | Freq: Three times a day (TID) | INTRAMUSCULAR | Status: DC
  Administered 2017-04-08 – 2017-04-13 (×15): 5000 [IU] via SUBCUTANEOUS
  Filled 2017-04-08 (×15): qty 1

## 2017-04-08 MED ORDER — QUETIAPINE FUMARATE 400 MG PO TABS
400.0000 mg | ORAL_TABLET | Freq: Every day | ORAL | Status: DC
  Administered 2017-04-08 – 2017-04-12 (×5): 400 mg via ORAL
  Filled 2017-04-08 (×2): qty 4
  Filled 2017-04-08: qty 1
  Filled 2017-04-08 (×2): qty 4

## 2017-04-08 MED ORDER — FUROSEMIDE 10 MG/ML IJ SOLN
40.0000 mg | Freq: Two times a day (BID) | INTRAMUSCULAR | Status: DC
  Administered 2017-04-08 – 2017-04-11 (×6): 40 mg via INTRAVENOUS
  Filled 2017-04-08 (×6): qty 4

## 2017-04-08 MED ORDER — CLONAZEPAM 0.5 MG PO TABS
1.0000 mg | ORAL_TABLET | Freq: Three times a day (TID) | ORAL | Status: DC
  Administered 2017-04-08 – 2017-04-13 (×16): 1 mg via ORAL
  Filled 2017-04-08 (×5): qty 1
  Filled 2017-04-08 (×2): qty 2
  Filled 2017-04-08 (×9): qty 1

## 2017-04-08 MED ORDER — ASPIRIN EC 81 MG PO TBEC
81.0000 mg | DELAYED_RELEASE_TABLET | Freq: Every day | ORAL | Status: DC
  Administered 2017-04-08 – 2017-04-13 (×6): 81 mg via ORAL
  Filled 2017-04-08 (×6): qty 1

## 2017-04-08 MED ORDER — POTASSIUM CHLORIDE 20 MEQ/15ML (10%) PO SOLN: 40.0000 meq | Freq: Every day | ORAL | Status: DC

## 2017-04-08 MED ORDER — FUROSEMIDE 10 MG/ML IJ SOLN: 40.0000 mg | Freq: Once | INTRAMUSCULAR | Status: DC

## 2017-04-08 MED ORDER — QUETIAPINE FUMARATE 300 MG PO TABS
300.0000 mg | ORAL_TABLET | Freq: Two times a day (BID) | ORAL | Status: DC
Start: 2017-04-08 — End: 2017-04-13
  Administered 2017-04-08 – 2017-04-13 (×11): 300 mg via ORAL
  Filled 2017-04-08 (×2): qty 3
  Filled 2017-04-08: qty 12
  Filled 2017-04-08 (×2): qty 3
  Filled 2017-04-08: qty 1
  Filled 2017-04-08 (×2): qty 3
  Filled 2017-04-08: qty 1
  Filled 2017-04-08 (×4): qty 3

## 2017-04-08 MED ORDER — ALBUTEROL SULFATE (2.5 MG/3ML) 0.083% IN NEBU
5.0000 mg | INHALATION_SOLUTION | Freq: Once | RESPIRATORY_TRACT | Status: AC
  Administered 2017-04-08: 5 mg via RESPIRATORY_TRACT
  Filled 2017-04-08: qty 6

## 2017-04-08 MED ORDER — ATORVASTATIN CALCIUM 40 MG PO TABS
40.0000 mg | ORAL_TABLET | Freq: Every day | ORAL | Status: DC
  Administered 2017-04-08 – 2017-04-12 (×5): 40 mg via ORAL
  Filled 2017-04-08 (×5): qty 1

## 2017-04-08 MED ORDER — SODIUM CHLORIDE 0.9% FLUSH
3.0000 mL | Freq: Two times a day (BID) | INTRAVENOUS | Status: DC
  Administered 2017-04-08 – 2017-04-12 (×8): 3 mL via INTRAVENOUS

## 2017-04-08 MED ORDER — ACETAMINOPHEN 650 MG RE SUPP: 650.0000 mg | Freq: Four times a day (QID) | RECTAL | Status: DC | PRN

## 2017-04-08 MED ORDER — ACETAMINOPHEN 325 MG PO TABS: 650.0000 mg | ORAL_TABLET | Freq: Four times a day (QID) | ORAL | Status: DC | PRN

## 2017-04-08 MED ORDER — POTASSIUM CHLORIDE ER 10 MEQ PO TBCR
40.0000 meq | EXTENDED_RELEASE_TABLET | Freq: Once | ORAL | Status: DC
  Filled 2017-04-08: qty 4

## 2017-04-08 MED ORDER — CLOPIDOGREL BISULFATE 75 MG PO TABS
75.0000 mg | ORAL_TABLET | Freq: Every day | ORAL | Status: DC
  Administered 2017-04-08 – 2017-04-13 (×6): 75 mg via ORAL
  Filled 2017-04-08 (×6): qty 1

## 2017-04-08 NOTE — Progress Notes (Signed)
Subjective:  Patient states that she has SOB today.  She denies any CP, nausea, chills, or lower extremity edema.   She does not take her lasix because she says she is concerned about her kidney function.  She says that she was told not to take it by an outpatient provider.    Objective:  Vital signs in last 24 hours: Vitals:   04/08/17 0853 04/08/17 0919 04/08/17 1000 04/08/17 1017  BP:   (!) 75/52 (!) 83/69  Pulse:   73 79  Resp:   12   Temp:      TempSrc:      SpO2: 100% 100% 99% 100%  Weight:    58.9 kg (129 lb 14.4 oz)  Height:    5\' 3"  (1.6 m)   Weight change:   3 lbs up from last discharge  Intake/Output Summary (Last 24 hours) at 04/08/17 1127 Last data filed at 04/08/17 1022  Gross per 24 hour  Intake              120 ml  Output              850 ml  Net             -730 ml   Gen: Lying comfortably in bed on 2L [place]. Neuro:  Alert and oriented x3.   HEENT: NCAT. EOMI. PERRLA. Moist mucous membranes.  CV: Regular rate and rhythm.  4/6 holosystolic murmur best appreciated at the the left fourth intercostal space with thrill over the same location.  JVD up to jaw.  Increases with hepato Pulm:  Bibasilar crackles bilaterally.  No wheezes Abdomen: Soft, non-tender to palpation in four quadrants.  Normal BS appreciated.  Extremities: No skin lesions appreciated.  Trace pitting LE edema.  Pulses in all extremities intact.  Assessment/Plan:  Principal Problem:   Acute on chronic systolic congestive heart failure (H[facility]) Active Problems:   Hyperlipidemia   ANXIETY DEPRESSION   GERD   Chronic renal disease, stage 3, moderately decreased glomerular filtration rate (GFR) between 30-59 mL/min/1.73 square meter   Severe mitral regurgitation  Acute on chronic HFrEF with Mitral Regurgitation-  Presented with progressive dyspnea, orthopnea, fatigue, weight gain in setting of diuretic non-compliance. Exam with prominent JVD and bibasilar crackles. She is warm and well-perfused but  has hypotension to ~80s/60s and complains of orthostatic dizziness. BNP elevated to 2500, CXR again with interstitial prominence and bilateral pleural effusions L>R. TTE showed ejection fraction of 30% to 35% with diffuse hypokinesis and severe mitral regurgitation. Will begin IV diuresis and monitor BP closely.  - Consult heart failure team and appreciate their recs - IV Lasix 40 mg BID - follow weight changes, strict I/Os - Trend BMP, Mg, replete electrolytes as necessary  Hypotension- Her hypotension with BP 80s/60s is likely cardiogenic of origin in setting of CHF exacerbation.  However she remains warm and well-perfused with mild orthostasis.  She was previous diagnosed with orthostatic hypotension at hospitalization last year where they ruled out AI. She had been prescribed Midodrine and stockings. We will not start Midodrine at this time given she is perfusing well, but may consider this in the future.  - Compression stockings - no midodrine at this time  Social isolation- Patient lives alone, manages own medications incorrectly, and does not have transportation to appointments - PT OT consulted - Will need [place] - CSW consult for transportation issues  CKD3, Cr 1.7 at baseline - Trend BMP   CAD, s/p  DES x2 in Sept 2017 - continue home Aspirin and Plavix  Mood disorder- Outpatient regimen of Seroquel, Risperdal, and Klonopin.  - continue home Seroquel 300 BID and 400 mg at night - continue home Risperdal - continue home Klonopin 1 mg TID  FEN/GI: Heart healthy diet. Replete electrolytes as needed DVT ppx: Lovenox Code status: DNR, but would accept intubation for respiratory failure   LOS: 0 days   Doug Sou, Medical Student 04/08/2017, 11:27 AM  Attestation for Student Documentation:  I personally was present and performed or re-performed the history, physical exam and medical decision-making activities of this service and have verified that the service and  findings are accurately documented in the student's note.  Zada Finders, MD 04/08/2017, 1:35 PM

## 2017-04-08 NOTE — Progress Notes (Signed)
  Echocardiogram 2D Echocardiogram has been performed.  Caroline [name] 04/08/2017, 10:23 AM

## 2017-04-08 NOTE — ED Notes (Signed)
Attempted IV without success.

## 2017-04-08 NOTE — Telephone Encounter (Signed)
Called [place], patient's daughter, back. There was no answer. Patient is currently in the hospital being treated.

## 2017-04-08 NOTE — Telephone Encounter (Signed)
New Message:    Pt's daughter wants to know if pt taking fluid medicine,might effect her kidney?

## 2017-04-08 NOTE — ED Triage Notes (Signed)
Per EMS pt complaint of increasing SHOB today that became unable to tolerate about 0230 this morning. Pt states she is unable to lie flat. Anxious, noted increased abdominal swelling/distention as she has had in the past.  Oxygen sats at 96% on RA but pt insistent on supplemental O2. VSS 11/74, RR 18, NSR in the 80s.  Pt lives alone. History of CHF.

## 2017-04-08 NOTE — ED Notes (Signed)
Admitting MD notified of pt's blood pressure. Advised repeat manual and page with results. Will continue to monitor

## 2017-04-08 NOTE — Care Management Note (Addendum)
Case Management Note  Patient Details  Name: Caroline [name] MRN: [id number] Date of Birth: September 13, 1951  Subjective/Objective:   From home alone, presents with Acute on chronic sys CHF, hld, anxiety, depression, Gerd, chronic reanl disease stage 3, severe mitral regurgitation.  She has [employer].  Await pt/ot eval.  Patient will need HHRN for CHF disease management.  She chose [facility] from [place] list, referral given to Butch Penny with [facility] for [facility] for CHF, will await pt/ot eval also.  PCP [facility]- Patient will need to call on Monday to schedule appt.                 Action/Plan: NCM will cont to follow for dc needs.   Expected Discharge Date:                  Expected Discharge Plan:  [place]  In-House Referral:     Discharge planning Services  CM Consult  Post Acute Care Choice:    Choice offered to:     DME Arranged:    DME Agency:     HH Arranged:  RN, Disease Management [place] Agency:  [place]  Status of Service:  In process, will continue to follow  If discussed at Long Length of Stay Meetings, dates discussed:    Additional Comments:  Zenon Mayo, RN 04/08/2017, 3:59 PM

## 2017-04-08 NOTE — ED Notes (Signed)
Admitting MD notified of pt's manual bp result.  Pt is not symptomatic at this time.  Will continue to monitor and await further orders

## 2017-04-08 NOTE — ED Provider Notes (Signed)
[facility] DEPT Provider Note   CSN: [id number] Arrival date & time: 04/08/17  5732  By signing my name below, I, Jaquelyn Bitter., attest that this documentation has been prepared under the direction and in the presence of Horton, Barbette Hair, MD. Electronically signed: Jaquelyn Bitter., ED Scribe. 04/08/17. 5:55 AM.   History   Chief Complaint Chief Complaint  Patient presents with  . Shortness of Breath    HPI Caroline [name] is a 66 y.o. female with hx of CHF, kidney disease, heart disease who presents to the Emergency Department bib [facility] complaining of SOB with onset x1 day. Pt states that for the past x1 day she has had increasing SOB. She denies being on O2 at baseline but states that she feels better since being placed on O2 by EMS. Patient reports orthopnea. Feels like she is somewhat volume overloaded and her abdomen is distended. Denies abdominal pain. Pt reports chronic constipation and states that she has not had a BM in x3 days. She denies any modifying factors. Pt denies fever, cough, chest pain, leg swelling, abdominal pain, nausea, vomiting, dizziness.  Of note, pt lives at home alone. She states that her blood pressure is low at baseline.    The history is provided by the patient. No language interpreter was used.    Past Medical History:  Diagnosis Date  . Angina   . Anxiety   . Arthritis    "severe in my back" (07/02/2016)  . Atrial fibrillation ([place])   . Bipolar disorder ([place])   . Bright disease   . CAD (coronary artery disease)    [place]  [facility]  Diagonal instent restenosis treated 10/2005  . Cancer of right breast ([place]) 02-28-01  . Carotid artery occlusion   . CHF (congestive heart failure) ([place])    states she was told se had this in past by MD  . Chronic back pain   . Depression    02-28-2002 had shock treatment due to depresson, spouse passed away 02-29-2000  . Dyslipidemia   . GERD (gastroesophageal reflux disease)   . Graves' disease    /notes  06/03/2010  . Heart murmur    states years ago  . Hypertension    states a long time ago-states she never took medication  . Non Q wave myocardial infarction ([place]) 03/2004   Archie Endo 06/03/2010  . Shortness of breath    with exertion    Patient Active Problem List   Diagnosis Date Noted  . Malnutrition of moderate degree 08/11/2016  . Bipolar 1 disorder, depressed, partial remission ([place]) 08/10/2016  . Syncope 08/09/2016  . Acute kidney injury superimposed on chronic kidney disease ([place]) 08/09/2016  . Coronary artery disease involving native coronary artery   . CAD S/P percutaneous coronary angioplasty   . Acute on chronic systolic congestive heart failure ([place]) 07/02/2016  . 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 Chronic renal disease, stage 3, moderately decreased glomerular filtration rate (GFR) between 30-59 mL/min/1.73 square meter 06/19/2015  . Adrenal insufficiency ([place])   . Orthostasis 06/11/2015  . Pubic ramus fracture ([place]) 06/09/2015  . Fracture of multiple pubic rami ([place]) 06/09/2015  . Chronic diarrhea 03/21/2015  . Cardiomyopathy, ischemic 01/23/2015  . 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 Chronic low back pain 05/21/2014  . Dizziness and giddiness 05/03/2014  . Difficulty speaking 05/03/2014  . Numbness-Bilateral arm / Leg 05/03/2014  . Aftercare following surgery of the circulatory system, [place] 05/03/2014  . Carotid artery occlusion with infarction ([place])  04/26/2013  . Weight loss 04/23/2012  . Hot flashes 04/23/2012  . Anemia 04/23/2012  . Breast cancer ([place]) 04/23/2012  . Occlusion and stenosis of carotid artery without mention of cerebral infarction 01/06/2012  . Bruit 12/09/2011  . SMOKER 05/24/2009  . ANXIETY DEPRESSION 02/28/2009  . GRAVES DISEASE 02/17/2009  . Hyperlipidemia 02/17/2009  . Coronary atherosclerosis 02/17/2009  . GERD 02/17/2009    Past Surgical History:  Procedure Laterality Date  . BREAST LUMPECTOMY Right 2002  . CARDIAC CATHETERIZATION  N/A 07/03/2016   Procedure: Right/Left Heart Cath and Coronary Angiography;  Surgeon: Leonie Man, MD;  Location: [facility];  Service: Cardiovascular;  Laterality: N/A;  . CARDIAC CATHETERIZATION N/A 07/22/2016   Procedure: Coronary Stent Intervention;  Surgeon: Leonie Man, MD;  Location: [facility];  Service: Cardiovascular;  Laterality: N/A;  . CAROTID ENDARTERECTOMY  01/08/12   Left  . CORONARY ANGIOPLASTY  03/2009   Archie Endo 06/03/2010  . CORONARY ANGIOPLASTY WITH STENT PLACEMENT  03/2004   Archie Endo 06/03/2010;  . CORONARY ANGIOPLASTY WITH STENT PLACEMENT      "I've got 3 stent in there" (07/02/2016)  . CORONARY STENT PLACEMENT  07/23/2016   STENT PROMUS PREM MR 2.5X12 drug eluting stent was successfully placed, and overlaps previously placed stent.  [name] ENDARTERECTOMY  01/08/2012   Procedure: ENDARTERECTOMY CAROTID;  Surgeon: Mal Misty, MD;  Location: [facility];  Service: Vascular;  Laterality: Left;  with Dacron patch angioplasty  . FRACTURE SURGERY  2007   Broken Back  . JOINT REPLACEMENT    . MULTIPLE TOOTH EXTRACTIONS  2016   "18 or 19"  . TEE WITHOUT CARDIOVERSION N/A 07/04/2016   Procedure: TRANSESOPHAGEAL ECHOCARDIOGRAM (TEE);  Surgeon: Larey Dresser, MD;  Location: [facility];  Service: Cardiovascular;  Laterality: N/A;  . TOTAL HIP ARTHROPLASTY Left 2011  . VAGINAL HYSTERECTOMY  1970s   partial    OB History    No data available       Home Medications    Prior to Admission medications   Medication Sig Start Date End Date Taking? Authorizing Provider  aspirin 81 MG tablet Take 1 tablet (81 mg total) by mouth daily. 07/05/16  Yes Barrett, Evelene Croon, PA-C  Black Cohosh 540 MG CAPS Take 1 capsule (540 mg total) by mouth 2 (two) times daily. 08/15/16  Yes Brunetta Jeans, PA-C  clonazePAM (KLONOPIN) 1 MG tablet Take 1 tablet (1 mg total) by mouth 2 (two) times daily. Patient taking differently: Take 1 mg by mouth 3 (three) times daily.  08/13/16  Yes  Arrien, Jimmy Picket, MD  clopidogrel (PLAVIX) 75 MG tablet Take ONE (1) tablet by mouth once daily 02/19/17  Yes Josue Hector, MD  ferrous sulfate 325 (65 FE) MG tablet Take 325 mg by mouth daily with breakfast.   Yes [provider]  furosemide (LASIX) 40 MG tablet Take 1 tablet (40 mg total) by mouth daily. 03/12/17  Yes Rice, Resa Miner, MD  nitroGLYCERIN (NITROSTAT) 0.4 MG SL tablet Place 1 tablet (0.4 mg total) under the tongue every 5 (five) minutes as needed for chest pain. 07/05/16  Yes Barrett, Evelene Croon, PA-C  QUEtiapine (SEROQUEL) 300 MG tablet Take 300 mg by mouth 2 (two) times daily.   Yes [provider]  QUEtiapine (SEROQUEL) 400 MG tablet Take 400 mg by mouth at bedtime.   Yes [provider]  risperiDONE (RISPERDAL) 2 MG tablet Take 2 mg by mouth 3 (three) times daily.  Yes [provider]  atorvastatin (LIPITOR) 40 MG tablet Take 1 tablet (40 mg total) by mouth daily at 6 PM. Patient not taking: Reported on 03/09/2017 08/15/16   Brunetta Jeans, PA-C  feeding supplement, ENSURE ENLIVE, (ENSURE ENLIVE) LIQD Take 237 mLs by mouth 2 (two) times daily after a meal. Patient not taking: Reported on 03/09/2017 08/13/16   Arrien, Jimmy Picket, MD  midodrine (PROAMATINE) 5 MG tablet Take 1 tablet (5 mg total) by mouth 3 (three) times daily with meals. Patient not taking: Reported on 03/09/2017 08/15/16   Brunetta Jeans, PA-C    Family History Family History  Problem Relation Age of Onset  . Coronary artery disease Father   . Prostate cancer Father        not sure  . Pancreatic cancer Father        not sure  . Cancer Father        Pancreatic and  Prostate  . Heart disease Father        Before age 29 and BPG  . Hyperlipidemia Father   . Heart attack Father   . Hypertension Mother   . Diabetes Sister   . Colon cancer Neg Hx     Social History Social History  Substance Use Topics  . Smoking status: Former Smoker    Packs/day:  0.75    Years: 49.00    Types: Cigarettes    Quit date: 08/04/2015  . Smokeless tobacco: Never Used  . Alcohol use No     Allergies   Patient has no known allergies.   Review of Systems Review of Systems  Constitutional: Negative for fever.  Respiratory: Negative for cough.   Cardiovascular: Positive for leg swelling. Negative for chest pain.  Gastrointestinal: Positive for constipation. Negative for abdominal pain, nausea and vomiting.  Neurological: Negative for dizziness.  All other systems reviewed and are negative.    Physical Exam Updated Vital Signs BP (!) 86/62 (BP Location: Right Arm)   Pulse 83   Temp 97.6 F (36.4 C) (Oral)   Resp 17   SpO2 99%   Physical Exam  Constitutional: She is oriented to person, place, and time.  Ill-appearing but nontoxic, no acute distress  HENT:  Head: Normocephalic and atraumatic.  Eyes: Pupils are equal, round, and reactive to light.  Cardiovascular: Normal rate, regular rhythm and normal heart sounds.   No murmur heard. Pulmonary/Chest: Effort normal. No respiratory distress. She has no wheezes.  Diminished breath sounds bilateral lower lobes with fine crackles  Abdominal: Soft. Bowel sounds are normal. There is no tenderness. There is no guarding.  Musculoskeletal: She exhibits edema.  2+ bilateral lower extremity edema  Neurological: She is alert and oriented to person, place, and time.  Skin: Skin is warm and dry.  Psychiatric: She has a normal mood and affect.  Nursing note and vitals reviewed.    ED Treatments / Results   DIAGNOSTIC STUDIES: Oxygen Saturation is 100% on RA, normal by my interpretation.   COORDINATION OF CARE: 5:55 AM-Discussed next steps with pt. Pt verbalized understanding and is agreeable with the plan.    Labs (all labs ordered are listed, but only abnormal results are displayed) Labs Reviewed  CBC WITH DIFFERENTIAL/PLATELET - Abnormal; Notable for the following:       Result Value    RBC 3.65 (*)    Hemoglobin 9.7 (*)    HCT 29.5 (*)    All other components within normal limits  BRAIN NATRIURETIC PEPTIDE -  Abnormal; Notable for the following:    B Natriuretic Peptide 2,543.0 (*)    All other components within normal limits  BASIC METABOLIC PANEL - Abnormal; Notable for the following:    Sodium 124 (*)    Potassium 3.3 (*)    Chloride 82 (*)    Glucose, Bld 100 (*)    Creatinine, Ser 1.69 (*)    GFR calc non Af Amer 31 (*)    GFR calc Af Amer 36 (*)    All other components within normal limits  D-DIMER, QUANTITATIVE (NOT AT [facility]) - Abnormal; Notable for the following:    D-Dimer, Quant 0.81 (*)    All other components within normal limits  URINALYSIS, ROUTINE W REFLEX MICROSCOPIC - Abnormal; Notable for the following:    Color, Urine COLORLESS (*)    Specific Gravity, Urine 1.002 (*)    All other components within normal limits  I-STAT TROPOININ, ED    EKG  EKG Interpretation  Date/Time:  Wednesday April 08 2017 03:23:23 EDT Ventricular Rate:  85 PR Interval:    QRS Duration: 107 QT Interval:  374 QTC Calculation: 445 R Axis:   15 Text Interpretation:  Sinus rhythm Ventricular trigeminy Prolonged PR interval Low voltage, extremity leads Consider anterior infarct Confirmed by Thayer Jew [phone]) on 04/08/2017 4:39:54 AM       Radiology Dg Chest 2 View  Result Date: 04/08/2017 CLINICAL DATA:  Shortness of breath tonight. EXAM: CHEST  2 VIEW COMPARISON:  Radiograph 03/09/2017 FINDINGS: Unchanged heart size and mediastinal contours allowing for differences in technique, mild cardiomegaly. Coronary stent in place. Left pleural effusion has increased from prior exam. Right pleural effusion has improved. Diffuse interstitial prominence appears similar to prior exam allowing for differences in technique. No acute osseous abnormalities. IMPRESSION: Shifting pleural effusions with increased size on the left and decreased size on the right. Diffuse interstitial  prominence appears similar to prior, may be interstitial edema or underlying emphysematous change. Electronically Signed   By: Jeb Levering M.D.   On: 04/08/2017 04:16    Procedures Procedures (including critical care time)  CRITICAL CARE Performed by: Thayer Jew F   25 minutes  Critical care time was exclusive of separately billable procedures and treating other patients.  Critical care was necessary to treat or prevent imminent or life-threatening deterioration.  Critical care was time spent personally by me on the following activities: development of treatment plan with patient and/or surrogate as well as nursing, discussions with consultants, evaluation of patient's response to treatment, examination of patient, obtaining history from patient or surrogate, ordering and performing treatments and interventions, ordering and review of laboratory studies, ordering and review of radiographic studies, pulse oximetry and re-evaluation of patient's condition.   Medications Ordered in ED Medications  albuterol (PROVENTIL) (2.5 MG/3ML) 0.083% nebulizer solution 5 mg (5 mg Nebulization Given 04/08/17 0413)  furosemide (LASIX) injection 40 mg (40 mg Intravenous Given 04/08/17 0551)     Initial Impression / Assessment and Plan / ED Course  I have reviewed the triage vital signs and the nursing notes.  Pertinent labs & imaging results that were available during my care of the patient were reviewed by me and considered in my medical decision making (see chart for details).  Clinical Course as of Apr 08 554  Wed Apr 08, 2017  0549 Discussed with cardiology fellow. He recommends dose of diuretic and formal cardiology consult after 7 AM.  [CH]    Clinical Course User Index [CH] Horton, Loma Sousa  F, MD    Patient presents with shortness of breath. Reports orthopnea. History of heart failure with an EF of 25%. Recent admission to the internal medicine service for acute on chronic failure  and volume overload. She appears volume overloaded on exam. She has supplemental oxygen in place but her O2 sats without oxygen are 92-94%. Of note, blood pressures noted to be 20-10 systolic. I have reviewed the patient's chart extensively. It appears her baseline is 07-121 systolic. On last admission there was one documented blood pressure of 88. Patient reports that her blood pressure is normally "low." She is otherwise asymptomatic and appears to be well perfused. Workup notable for chronic hyponatremia slightly worse than prior. BNP has approximately doubled since prior admission and she has shifting pleural effusions on x-ray. Her initial workup included a d-dimer as she had not previously had any workup for PE. It is slightly elevated at 0.81. However, clinically and given collateral information, I feel she is likely in acute heart failure.  I discussed the patient with internal medicine teaching service and cardiology. I do not feel that she is currently in cardiogenic shock; however, I feel she is likely more decompensated from prior with lower blood pressures and may require inotropic support in order to diurese effectively. I discussed with cardiology. They recommend formal cardiology consult for the daytime team. Agree with diuresis. On multiple rechecks, patient is comfortable appearing and in no acute distress.  Final Clinical Impressions(s) / ED Diagnoses   Final diagnoses:  Acute decompensated heart failure ([place])    New Prescriptions New Prescriptions   No medications on file   I personally performed the services described in this documentation, which was scribed in my presence. The recorded information has been reviewed and is accurate.     Merryl Hacker, MD 04/08/17 [phone]

## 2017-04-08 NOTE — Progress Notes (Addendum)
Paged IMTS at 1315 to notify of patient's blood pressures running systolic <02-77, manually and automatically, per order request.   Received a return phone call at 1316 stating the order parameters would be changed, as patient is hypotensive at baseline.  Will continue to monitor.  **Paged IMTS at 1542 regarding patient's MAP of 58, BP 82/46. Received call at 1544, will monitor patient's MAP over next hour and will update IMTS as needed.

## 2017-04-08 NOTE — H&P (Signed)
Date: 04/08/2017               Patient Name:  Caroline [name] MRN: [id number]  DOB: 1951/07/29 Age / Sex: 66 y.o., female   PCP: Patient, No Pcp Per         Medical Service: Internal Medicine Teaching Service         Attending Physician: Dr. Lalla Brothers    First Contact: Dr. Ophelia Shoulder Pager: [phone]  Second Contact: Dr. Zada Finders Pager: [phone]       After Hours (After 5p/  First Contact Pager: [phone]  weekends / holidays): Second Contact Pager: [phone]   Chief Complaint: Shortness of breath  History of Present Illness: Caroline [name] is a 66 y.o. woman with PMH systolic CHF (EF 00-17%, severe MR in 07/2016), multivessel CAD (s/p DES to D/OM1 in 07/2016), CKD stage 3, carotid artery stenosis, and chronic mood disorder who presented to the ED with progressive dyspnea over the past two days Her dyspnea is worsened with exertion and she has felt very fatigued and dizzy. She has also noticed increasing orthopnea and feels like she's gained weight. She has been taking her prescribed daily Lasix as a PRN medication, on average about once per week, voicing concerns about her kidneys. She was admitted last month for CHF exacerbation but cancelled four hospital follow up appointments due to lack of transportation. She denies chest pain, cough, fever/chills, leg swelling, abdominal pain, nausea, vomiting, loss of appetite, syncope, falls.   In the ED she was afebrile, HR 88, RR 20, and hypotensive in 80s/60s with SpO2 99% on . Labs remarkable for BNP 2,543, Na 124 (chronic), K 3.3, Cr 1.69 (baseline), D-dimer 0.81, and troponin 0.04. CXR showed diffuse interstitial prominence and shifting pleural effusions (L>R). IMTS was contacted for admission.   Meds:  Current Meds  Medication Sig  . aspirin 81 MG tablet Take 1 tablet (81 mg total) by mouth daily.  . Black Cohosh 540 MG CAPS Take 1 capsule (540 mg total) by mouth 2 (two) times daily.  . clonazePAM (KLONOPIN) 1 MG tablet Take 1  tablet (1 mg total) by mouth 2 (two) times daily. (Patient taking differently: Take 1 mg by mouth 3 (three) times daily. )  . clopidogrel (PLAVIX) 75 MG tablet Take ONE (1) tablet by mouth once daily  . ferrous sulfate 325 (65 FE) MG tablet Take 325 mg by mouth daily with breakfast.  . furosemide (LASIX) 40 MG tablet Take 1 tablet (40 mg total) by mouth daily. Taking this PRN  . nitroGLYCERIN (NITROSTAT) 0.4 MG SL tablet Place 1 tablet (0.4 mg total) under the tongue every 5 (five) minutes as needed for chest pain.  [name] QUEtiapine (SEROQUEL) 300 MG tablet Take 300 mg by mouth 2 (two) times daily. Taking this TID  . QUEtiapine (SEROQUEL) 400 MG tablet Take 400 mg by mouth at bedtime.  . risperiDONE (RISPERDAL) 2 MG tablet Take 2 mg by mouth 3 (three) times daily.   Allergies: Allergies as of 04/08/2017  . (No Known Allergies)   Past Medical History:  Diagnosis Date  . Angina   . Anxiety   . Arthritis    "severe in my back" (07/02/2016)  . Atrial fibrillation ([place])   . Bipolar disorder ([place])   . Bright disease   . CAD (coronary artery disease)    [facility]  Diagonal instent restenosis treated 10/2005  . Cancer of right breast ([place]) 2002  . Carotid artery  occlusion   . CHF (congestive heart failure) ([place])    states she was told se had this in past by MD  . Chronic back pain   . Depression    2002/02/23 had shock treatment due to depresson, spouse passed away 24-Feb-2000  . Dyslipidemia   . GERD (gastroesophageal reflux disease)   . Graves' disease    /notes 06/03/2010  . Heart murmur    states years ago  . Hypertension    states a long time ago-states she never took medication  . Non Q wave myocardial infarction ([place]) 03/2004   Archie Endo 06/03/2010  . Shortness of breath    with exertion   Family History:  Family History  Problem Relation Age of Onset  . Coronary artery disease Father   . Prostate cancer Father        not sure  . Pancreatic cancer Father        not sure  . Cancer  Father        Pancreatic and  Prostate  . Heart disease Father        Before age 67 and BPG  . Hyperlipidemia Father   . Heart attack Father   . Hypertension Mother   . Diabetes Sister   . Colon cancer Neg Hx    Social History:  Social History   Social History  . Marital status: Widowed    Spouse name: N/A  . Number of children: N/A  . Years of education: N/A   Occupational History  . Not on file.   Social History Main Topics  . Smoking status: Former Smoker    Packs/day: 0.75    Years: 49.00    Types: Cigarettes    Quit date: 08/04/2015  . Smokeless tobacco: Never Used  . Alcohol use No  . Drug use: No  . Sexual activity: No   Other Topics Concern  . Not on file   Social History Narrative   Lives in [place], [place] alone. Independent in ADLs, relies on friend for IADLs/groceries. No family nearby. Unable to get transportation consistently to appointments. She has 1 daughter.  She is on disability.    Review of Systems: A complete ROS was negative except as per HPI.   Physical Exam: Blood pressure (!) 81/63, pulse 77, temperature 97.6 F (36.4 C), temperature source Oral, resp. rate 14, SpO2 100 %.  General appearance: Elderly-appearing woman resting comfortably in bed, in no distress HENT: Normocephalic, atraumatic, dry mucous membranes, JVD to jawline bilaterally Eyes: PERRL, non-icteric Cardiovascular: Regular rate and occasional PVC, systolic murmur over apex Respiratory: Bibasilar inspiratory crackles, normal work of breathing Abdomen: BS+, soft, non-tender, mildly distended Extremities: Normal bulk and range of motion, trace pitting edema of BLES, warm and well-perfused Skin: Warm, dry, intact Neuro: Alert and oriented, dysarthric speech Psych: Labile affect, soft speech, thoughts linear and goal-directed  EKG: Sinus rhythm, ventricular trigeminy, largely unchanged  Assessment & Plan by Problem: Principal Problem:   Acute on chronic systolic congestive  heart failure (HCC) Active Problems:   Hyperlipidemia   ANXIETY DEPRESSION   GERD   Chronic renal disease, stage 3, moderately decreased glomerular filtration rate (GFR) between 30-59 mL/min/1.73 square meter   Severe mitral regurgitation  Acute on chronic systolic CHF, presenting with progressive dyspnea, orthopnea, fatigue, weight gain in setting of diuretic non-compliance. Cardiologist is Dr. Johnsie Cancel. Exam with prominent JVD, bibasilar crackles. She is warm and well-perfused but with significant hypotension and pressure 80s/60s this admission and endorsing orthostatic dizziness,  nearing cardiogenic shock. BNP elevated above 2000, CXR again with interstitial prominence and bilateral pleural effusions, now L>R. Will begin IV diuresis and monitor BP closely.  -- Consult heart failure team, appreciate recs -- IV Lasix 40 mg BID -- Daily weights, strict I/Os -- Trend BMP, Mg, replete electrolytes as necessary -- TTE  Hypotension, BP 80s/60s, likely cardiogenic in setting of CHF exacerbation, warm and well-perfused on exam, dizzy only with position changes, not quite in cardiogenic shock. Previous dx'd with orthostatic hypotension at hospitalization last year, previously ruled out AI, had been prescribed Midodrine and stockings. -- Compression stockings -- Consider resuming Midodrine 5 mg TID  Social isolation, lives alone, manages own medications incorrectly, does not have transportation to appointments -- PT OT eval and treat -- Will need HH RN -- CSW consult for transportation issues  Mood disorder  -- continue home Seroquel 300 BID and 400 mg at night -- continue home Risperdal -- continue home Klonopin 1 mg TID  CKD3, Cr 1.7 at baseline -- Trend BMP   Elevated D-dimer, 0.8, Wells Score 0, clinical history and presentation do not fit VTE/PE  CAD, s/p DES x2 in Sept 2017 -- continue home Aspirin and Plavix  FEN/GI: HH diet, replete electrolytes as needed  DVT ppx:  Lovenox  Code status: DNR, but would accept intubation for respiratory failure  Dispo: Admit patient to Observation with expected length of stay less than 2 midnights.  Signed: Asencion Partridge, MD 04/08/2017, 6:35 AM  Pager: [phone]

## 2017-04-09 DIAGNOSIS — I340 Nonrheumatic mitral (valve) insufficiency: Secondary | ICD-10-CM

## 2017-04-09 DIAGNOSIS — E875 Hyperkalemia: Secondary | ICD-10-CM

## 2017-04-09 DIAGNOSIS — J90 Pleural effusion, not elsewhere classified: Secondary | ICD-10-CM

## 2017-04-09 DIAGNOSIS — I5023 Acute on chronic systolic (congestive) heart failure: Secondary | ICD-10-CM

## 2017-04-09 DIAGNOSIS — N183 Chronic kidney disease, stage 3 (moderate): Secondary | ICD-10-CM

## 2017-04-09 LAB — CBC
HCT: 29.5 % — ABNORMAL LOW (ref 36.0–46.0)
Hemoglobin: 9.6 g/dL — ABNORMAL LOW (ref 12.0–15.0)
MCH: 26.6 pg (ref 26.0–34.0)
MCHC: 32.5 g/dL (ref 30.0–36.0)
MCV: 81.7 fL (ref 78.0–100.0)
PLATELETS: 258 10*3/uL (ref 150–400)
RBC: 3.61 MIL/uL — ABNORMAL LOW (ref 3.87–5.11)
RDW: 15.9 % — AB (ref 11.5–15.5)
WBC: 5.9 10*3/uL (ref 4.0–10.5)

## 2017-04-09 LAB — COMPREHENSIVE METABOLIC PANEL
ALT: 20 U/L (ref 14–54)
AST: 20 U/L (ref 15–41)
Albumin: 3.3 g/dL — ABNORMAL LOW (ref 3.5–5.0)
Alkaline Phosphatase: 85 U/L (ref 38–126)
Anion gap: 13 (ref 5–15)
BILIRUBIN TOTAL: 0.8 mg/dL (ref 0.3–1.2)
BUN: 20 mg/dL (ref 6–20)
CO2: 29 mmol/L (ref 22–32)
Calcium: 9.3 mg/dL (ref 8.9–10.3)
Chloride: 89 mmol/L — ABNORMAL LOW (ref 101–111)
Creatinine, Ser: 1.86 mg/dL — ABNORMAL HIGH (ref 0.44–1.00)
GFR calc Af Amer: 32 mL/min — ABNORMAL LOW (ref >60)
GFR, EST NON AFRICAN AMERICAN: 28 mL/min — AB (ref >60)
Glucose, Bld: 96 mg/dL (ref 65–99)
POTASSIUM: 3.1 mmol/L — AB (ref 3.5–5.1)
Sodium: 131 mmol/L — ABNORMAL LOW (ref 135–145)
TOTAL PROTEIN: 6.3 g/dL — AB (ref 6.5–8.1)

## 2017-04-09 LAB — MAGNESIUM: Magnesium: 1.9 mg/dL (ref 1.7–2.4)

## 2017-04-09 MED ORDER — POTASSIUM CHLORIDE 20 MEQ/15ML (10%) PO SOLN
40.0000 meq | Freq: Two times a day (BID) | ORAL | Status: DC
  Administered 2017-04-09 – 2017-04-10 (×4): 40 meq via ORAL
  Filled 2017-04-09 (×4): qty 30

## 2017-04-09 NOTE — Discharge Summary (Signed)
Name: Caroline [name] MRN: [id number] DOB: 08-06-1951 66 y.o. PCP: Patient, No Pcp Per  Date of Admission: 04/08/2017  3:22 AM Date of Discharge: 04/13/2017 Attending Physician: Sid Falcon, MD  Discharge Diagnosis: 1. HFrEF exacerbation  Principal Problem:   Acute on chronic systolic congestive heart failure ([place]) Active Problems:   Hyperlipidemia   ANXIETY DEPRESSION   GERD   Chronic renal disease, stage 3, moderately decreased glomerular filtration rate (GFR) between 30-59 mL/min/1.73 square meter   Severe mitral regurgitation   Pressure injury of skin   Discharge Medications: Allergies as of 04/13/2017   No Known Allergies     Medication List    STOP taking these medications   Black Cohosh 540 MG Caps   midodrine 5 MG tablet Commonly known as:  PROAMATINE     TAKE these medications   aspirin 81 MG tablet Take 1 tablet (81 mg total) by mouth daily.   atorvastatin 40 MG tablet Commonly known as:  LIPITOR Take 1 tablet (40 mg total) by mouth daily at 6 PM.   clonazePAM 1 MG tablet Commonly known as:  KLONOPIN Take 1 tablet (1 mg total) by mouth 3 (three) times daily.   clopidogrel 75 MG tablet Commonly known as:  PLAVIX Take ONE (1) tablet by mouth once daily   feeding supplement (ENSURE ENLIVE) Liqd Take 237 mLs by mouth 2 (two) times daily after a meal.   ferrous sulfate 325 (65 FE) MG tablet Take 325 mg by mouth daily with breakfast.   furosemide 40 MG tablet Commonly known as:  LASIX Take 1 tablet (40 mg total) by mouth 2 (two) times daily. What changed:  when to take this   nitroGLYCERIN 0.4 MG SL tablet Commonly known as:  NITROSTAT Place 1 tablet (0.4 mg total) under the tongue every 5 (five) minutes as needed for chest pain.   potassium chloride SA 20 MEQ tablet Commonly known as:  K-DUR,KLOR-CON Take 1 tablet (20 mEq total) by mouth daily.   QUEtiapine 400 MG tablet Commonly known as:  SEROQUEL Take 400 mg by mouth at bedtime.    QUEtiapine 300 MG tablet Commonly known as:  SEROQUEL Take 300 mg by mouth 2 (two) times daily.   risperiDONE 2 MG tablet Commonly known as:  RISPERDAL Take 2 mg by mouth 3 (three) times daily.       Disposition and follow-up:   Caroline [name] was discharged from [facility] in stable condition.  At the hospital follow up visit please address:  1.  HFrEF, lasix use, f/u hypoNa, Depression / possible hypothyroidism  2.  Labs / imaging needed at time of follow-up: CBC, BMP, Mg, TSH  3.  Pending labs/ test needing follow-up: None  Follow-up Appointments: Follow-up [place] Follow up.   Why:  please call on [place] to make apt.   Contact information: [place] [address] [place], [facility] Follow up.   Why:  home health nurse Contact information: [place] [address] [phone]        [place] Appointment Transportation Follow up.   Why:  Call this number 48 hours in advance to your medical appointments for transportation. Contact information: [phone]       Josue Hector, MD. Schedule an appointment as soon as possible for a visit in 2 week(s).   Specialty:  Cardiology Contact information: [address] [address] [phone]           Hospital Course by problem list: Principal Problem:   Acute on chronic systolic congestive heart failure (HCC) Active Problems:   Hyperlipidemia   ANXIETY DEPRESSION   GERD   Chronic renal disease, stage 3, moderately decreased glomerular filtration rate (GFR) between 30-59 mL/min/1.73 square meter   Severe mitral regurgitation   Pressure injury of skin   Caroline [name] is a 66 y.o. woman with PMH systolic CHF (EF 95-28%, severe MR), multivessel CAD (s/p DES to D/OM1 in 07/2016), CKD stage 3, carotid artery  stenosis, and chronic mood disorder who presented to the ED with  2 days of progressive dyspnea treated for HF exacerbation secondary to medication non-compliance.   1. HFrEF with severe mitral regurgitation  [name] presented to the ED hypotensive with bp in 80s/60s with BNP 2,543, Na 124 (chronic), Cr 1.69 (baseline), and troponin 0.04. CXR showed diffuse interstitial prominence likely representing interstitial edema and shifting pleural effusions (L>R).   She was warm and perfusing well and was started on IV lasix 40 mg BID at that time.  Cardiology was consulted and followed her during her hospitalization. ECHO on 6/6 showed EF 30-35% with severe MR.  She is not a surgical candidate for her MR.  IV lasix 40 mg was continued through 6/8 (hosptial day 3) with continued improvement.  She transitioned well to PO lasix 40 mg on hospital day 4 which was continued on discharge.  She had a net output of 11L during her stay with resolution of pulmonary edema. There was a question of accurate weights however discharge weight was 123 lbs compared to 136 lbs on admission.  Due to her chronic hypotension she cannot tolerate evidence-based HF medications such as  B-blockers and ACE-I.  2. Hypotension Her hypotension remained stable with BP 90s/60s and islikely cardiogenic of origin in setting of CHF. She remained warm and was perfusing well throughout admission. Shehad been prescribed Midodrine previously, but we did not start Midodrine given she did not have perfusion issues.    3. Hyponatremia: Asymptomatic with serum Na 131 on admission and dropped to 124 during admission w/o improvement with diuresis. Her hyponatremia is chronic with multifactorial etiology.  HF, renal disease, possible mild hypothyroidism, and diuretic use could be contributing. She had a similar presentation on last admission with similar lab findings. It was initially thought that her hyponatremia was due to her HF exacerbation, but it did  not improve with diuresis.  Hypothyroidism could be playing a role as TSH on 6/6 mildly elevated to 4.85.  However, this should be rechecked when not in the setting of acute illness.  Workup on 6/10 showed serum osm low (272) with FeUrea of 47% suggesting due to intrinsic renal disease.  Na of 130 on discharge.   4. Depression / Mood disorder- Patient had a flat affect throughout admission.  TSH was mildly elevated in setting of acute illness on 6/6.  She has an outpatient regimen of Seroquel, Risperdal, and Klonopin and these were continued throughout the admission. She will need follow up as outpatient to assess possible hypothyroidism / depression.   5. Hypokalemia Repleted K while inpatient.  Admission K of 3.3, 3.5 on discharge. Supplemental 20 mEq potassium was continued on discharge.  6. CKD stage 3 Her baseline creatinine is 1.7. This was followed throughout her stay and remained ~1.85 with diuretic therapy.   7.  Pressure injury of skin: Slight erythema at the the sacrum identified by nursing without skin break. Prophylactic foam dressing was applied during admission and continued on discharge.  Discharge Vitals:   BP (!) 93/46 (BP Location: Left Arm)   Pulse 86   Temp 97.9 F (36.6 C) (Oral)   Resp 18   Ht 5\' 3"  (1.6 m)   Wt 123 lb 4.8 oz (55.9 kg)   SpO2 98%   BMI 21.84 kg/m   Pertinent Labs, Studies, and Procedures:   BNP On admission 2,542.   CXR - on day of admission FINDINGS: Unchanged heart size and mediastinal contours allowing for differences in technique, mild cardiomegaly. Coronary stent in place. Left pleural effusion has increased from prior exam. Right pleural effusion has improved. Diffuse interstitial prominence appears similar to prior exam allowing for differences in technique. No acute osseous abnormalities.  IMPRESSION: Shifting pleural effusions with increased size on the left and decreased size on the right.  Diffuse interstitial prominence  appears similar to prior, may be interstitial edema or underlying emphysematous change.   ECHO 04/08/17 Study Conclusions - Left ventricle: The cavity size was moderately dilated. Wall   thickness was normal. Systolic function was moderately to   severely reduced. The estimated ejection fraction was in the   range of 30% to 35%. Diffuse hypokinesis. Doppler parameters are   consistent with both elevated ventricular end-diastolic filling   pressure and elevated left atrial filling pressure. - Aortic valve: There was mild regurgitation. Valve area (VTI):   1.78 cm^2. Valve area (Vmax): 1.53 cm^2. Valve area (Vmean): 1.4   cm^2. - Mitral valve: There was severe regurgitation. - Left atrium: The atrium was moderately dilated. - Atrial septum: No defect or patent foramen ovale was identified. - Pulmonary arteries: PA peak pressure: 44 mm Hg (S).  Discharge Instructions: Discharge Instructions    (HEART FAILURE PATIENTS) Call MD:  Anytime you have any of the following symptoms: 1) 3 pound weight gain in 24 hours or 5 pounds in 1 week 2) shortness of breath, with or without a dry hacking cough 3) swelling in the hands, feet or stomach 4) if you have to sleep on extra pillows at night in order to breathe.    Complete by:  As directed    Call MD for:  difficulty breathing, headache or visual disturbances    Complete by:  As directed    Call MD for:  extreme fatigue    Complete by:  As directed    Call MD for:  persistant dizziness or light-headedness    Complete by:  As directed    Call MD for:  persistant nausea and vomiting    Complete by:  As directed    Call MD for:  severe uncontrolled pain    Complete by:  As directed    Call MD for:  temperature >100.4    Complete by:  As directed    Diet - low sodium heart healthy    Complete by:  As directed    Discharge instructions    Complete by:  As directed    Assess volume status and medication adherence. Repeat BMET to monitor renal  function, chronic hyponatremia, and potassium. Patient discharged on oral Lasix 40 mg BID.   Increase activity slowly    Complete by:  As directed       - Continue lasix 40 mg BID - f/u fluid status and BMP w/ PCP  - f/u mood and TSH for possible  hypothyroidism - has [place] and arrangements for transportation to visits  Signed: Zada Finders, MD 04/13/2017, 12:51 PM

## 2017-04-09 NOTE — Progress Notes (Signed)
Subjective:  Caroline [name] says she is feeling ok this morning, but her shortness of breath has not much improved.  No CP, lightheadedness or dizziness today. She did have one episode of vomiting after eating her dinner last night.  She reports good urine output.  Objective:  Vital signs in last 24 hours: Vitals:   04/08/17 1946 04/09/17 0000 04/09/17 0400 04/09/17 0500  BP: 106/66 (!) 100/56 (!) 98/57   Pulse: 89 85 68   Resp: 16 17 13    Temp: 98.3 F (36.8 C) 97.9 F (36.6 C) 97.6 F (36.4 C)   TempSrc: Oral Oral Oral   SpO2: 100% 100% 100%   Weight:    61.9 kg (136 lb 7.4 oz)  Height:       Weight change:   Intake/Output Summary (Last 24 hours) at 04/09/17 0747 Last data filed at 04/09/17 0350  Gross per 24 hour  Intake              840 ml  Output             3700 ml  Net            -2860 ml   Gen: Resting comfortably in bed on 2 L [place].   Neuro:  Alert and oriented x3 HEENT: NCAT. EOMI. PERRLA. Moist mucous membranes.  CV: Regular rate and rhythm.  4/6 holosystolic murmur best appreciated at the the left fourth intercostal space with thrill over the same location.  JVD up to jaw.  Pulm:  Bibasilar crackles bilaterally. No wheezes or rhonchi. Abdomen: Soft, non-tender to palpation in four quadrants.  Normal BS appreciated.  Extremities: No skin lesions appreciated.  Trace pitting LE edema.  Pulses in all extremities intact.  Assessment/Plan:  Principal Problem:   Acute on chronic systolic congestive heart failure (HCC) Active Problems:   Hyperlipidemia   ANXIETY DEPRESSION   GERD   Chronic renal disease, stage 3, moderately decreased glomerular filtration rate (GFR) between 30-59 mL/min/1.73 square meter   Severe mitral regurgitation  Caroline. Caroline [name] is a 66 yo woman with HFrEF (EF 30-35%), severe mitral regurgitation, CAD, stage 3 CKD, HLD, anxiety/depression, and GERD who presents with a heart failure exacerbation due to medication non-compliance.   Acute on chronic  HFrEF with Mitral Regurgitation-  Presented with progressive dyspnea, orthopnea, fatigue, weight gain in setting of diuretic non-compliance. She is warm and well-perfused but has hypotension to ~90s/60s. BNP was elevated to 2500 on admission and CXR showed interstitial prominence likely representing interstitial edema and bilateral pleural effusions L>R. TTE showed ejection fraction of 30% to 35% with diffuse hypokinesis and severe mitral regurgitation. Will continue IV diuresis and monitor BP closely.  [facility] cardiology team and appreciate their recs - IV Lasix 40 mg BID - follow weight changes, strict I/Os - Trend BMP, Mg, replete electrolytes as necessary - Keep MAP > 60  Hypotension- Her hypotension with BP 90s/60s is likely cardiogenic of origin in setting of CHF exacerbation.  However she remains warm and well-perfused.  She had been prescribed Midodrine and stockings. We will not start Midodrine at this time given she is perfusing well, but may consider this in the future.  - no midodrine at this time  Hyperkalemia -  K 3.1  - will replete potassium 40 mEq BID  Social isolation- Patient lives alone, manages own medications incorrectly, and does not have transportation to appointments - PT OT consulted - Will need [place] RN - [place] consulted for transportation issues  and HH RN  CKD3, Cr 1.7 at baseline, now up slightly to 1.86 - Trend BMP   CAD, s/p DES x2 in Sept 2017 - continue home Aspirin and Plavix  Mood disorder- Outpatient regimen of Seroquel, Risperdal, and Klonopin.  - continue home Seroquel 300 BID and 400 mg at night - continue home Risperdal - continue home Klonopin 1 mg TID  FEN/GI:Heart healthy diet. Replete electrolytes as needed DVT ppx: Lovenox Code status: DNR, but would accept intubation for respiratory failure   LOS: 1 day   Doug Sou, Medical Student 04/09/2017, 7:47 AM  Attestation for Student Documentation:  I personally was present and  performed or re-performed the history, physical exam and medical decision-making activities of this service and have verified that the service and findings are accurately documented in the student's note.  Zada Finders, MD 04/09/2017, 9:52 AM

## 2017-04-09 NOTE — Progress Notes (Signed)
Tech offered Pt a bath. Pt stated she has already had a bath. No documentation for hygiene listed. Tech then asked Pt did she have a full bath or was it just a wipe off after using bathroom. Pt stated she does not want a full bath right now.

## 2017-04-09 NOTE — Progress Notes (Signed)
Internal Medicine Night Float Interim Progress Note  S: Went to check on patient this evening. Patient was sleeping soundly.   O: Vitals:   04/08/17 1946 04/09/17 0000  BP: 106/66 (!) 100/56  Pulse: 89 85  Resp: 16 17  Temp: 98.3 F (36.8 C) 97.9 F (36.6 C)   Physical Exam Cardiovascular: Trace pitting edema in bilateral lower extremities, improved. Extremities are warm and well perfused.   A/P: Continue current management. MAP improving with diuresis.   Martyn Malay, DO PGY-3 Internal Medicine Resident Pager # [phone] 04/09/2017 12:58 AM

## 2017-04-09 NOTE — Evaluation (Signed)
Occupational Therapy Evaluation Patient Details Name: SEHAJ KOLDEN MRN: [id number] DOB: 1951/01/02 Today's Date: 04/09/2017    History of Present Illness Pt is a 66 y.o. female presenting with progressive dyspnea over the past two days with dizziness and fatigue. PMHx: Anxiety, Arthritis, Afib, Bipolar disorder, Bright disease, CAD, Carotid artery occlusion, CHF, Depression, GERD, Graves disease, HTN, MI, SOB with exertion.   Clinical Impression   Pt reports she was independent with ADL PTA. Currently pt overall min guard for ADL and functional mobility. No SOB noted with activity; SpO2 >95% on RA throughout. Pt planning to d/c home alone with intermittent supervision from friends. Pt would benefit from continued skilled OT to address established goals.    Follow Up Recommendations  No OT follow up;Supervision - Intermittent    Equipment Recommendations  None recommended by OT    Recommendations for Other Services       Precautions / Restrictions Precautions Precautions: Fall Restrictions Weight Bearing Restrictions: No      Mobility Bed Mobility Overal bed mobility: Needs Assistance Bed Mobility: Supine to Sit     Supine to sit: Supervision     General bed mobility comments: Increased time, no physical assist  Transfers Overall transfer level: Needs assistance Equipment used: None Transfers: Sit to/from Stand Sit to Stand: Min guard         General transfer comment: for safety; no physical assist    Balance Overall balance assessment: Needs assistance Sitting-balance support: Feet supported;No upper extremity supported Sitting balance-Leahy Scale: Good     Standing balance support: No upper extremity supported;During functional activity Standing balance-Leahy Scale: Fair                             ADL either performed or assessed with clinical judgement   ADL Overall ADL's : Needs assistance/impaired Eating/Feeding: Set up;Sitting    Grooming: Min guard;Standing;Brushing hair   Upper Body Bathing: Set up;Supervision/ safety;Sitting   Lower Body Bathing: Min guard;Sit to/from stand   Upper Body Dressing : Set up;Supervision/safety;Sitting   Lower Body Dressing: Min guard;Sit to/from stand   Toilet Transfer: Min guard;Ambulation           Functional mobility during ADLs: Min guard General ADL Comments: SpO2 >95% on RA throughout, no DOE noted     Vision Baseline Vision/History: Wears glasses Wears Glasses: Reading only       Perception     Praxis      Pertinent Vitals/Pain Pain Assessment: No/denies pain     Hand Dominance     Extremity/Trunk Assessment Upper Extremity Assessment Upper Extremity Assessment: Generalized weakness   Lower Extremity Assessment Lower Extremity Assessment: Defer to PT evaluation       Communication Communication Communication: Other (comment) (slurred speech; difficult to understand at times)   Cognition Arousal/Alertness: Awake/alert Behavior During Therapy: Flat affect Overall Cognitive Status: Within Functional Limits for tasks assessed                                 General Comments: Pt gets easily frustrated when you cant understand what she is saying   General Comments       Exercises     Shoulder Instructions      Home Living Family/patient expects to be discharged to:: Private residence Living Arrangements: Alone Available Help at Discharge: Friend(s);Available PRN/intermittently Type of Home: House Home Access: Stairs to enter  Entrance Stairs-Number of Steps: 6 in front, 3 in back   Home Layout: Two level;Bed/bath upstairs     Bathroom Shower/Tub: Walk-in shower   Bathroom Toilet: Handicapped height     Home Equipment: Environmental consultant - 2 wheels;Cane - single point;Bedside commode;Shower seat          Prior Functioning/Environment Level of Independence: Independent        Comments: friend that brings groceries and  takes her to appointments        OT Problem List: Decreased strength;Decreased activity tolerance;Impaired balance (sitting and/or standing);Decreased cognition;Decreased knowledge of use of DME or AE;Cardiopulmonary status limiting activity      OT Treatment/Interventions: Self-care/ADL training;Energy conservation;DME and/or AE instruction;Therapeutic activities;Patient/family education;Balance training    OT Goals(Current goals can be found in the care plan section) Acute Rehab OT Goals Patient Stated Goal: none stated OT Goal Formulation: With patient Time For Goal Achievement: 04/23/17 Potential to Achieve Goals: Good ADL Goals Pt Will Transfer to Toilet: with modified independence;ambulating;regular height toilet Pt Will Perform Toileting - Clothing Manipulation and hygiene: with modified independence;sit to/from stand Pt Will Perform Tub/Shower Transfer: Shower transfer;with modified independence;ambulating;shower seat Additional ADL Goal #1: Pt will gather ADL items and perform UB/LB bathing/dressing with mod I. Additional ADL Goal #2: Pt will independently recall 3 energy conservation strategies and use during ADL.  OT Frequency: Min 2X/week   Barriers to D/C: Decreased caregiver support  pt lives alone       Co-evaluation              AM-PAC PT "6 Clicks" Daily Activity     Outcome Measure Help from another person eating meals?: None Help from another person taking care of personal grooming?: A Little Help from another person toileting, which includes using toliet, bedpan, or urinal?: A Little Help from another person bathing (including washing, rinsing, drying)?: A Little Help from another person to put on and taking off regular upper body clothing?: None Help from another person to put on and taking off regular lower body clothing?: A Little 6 Click Score: 20   End of Session Nurse Communication: Mobility status;Other (comment) (removed supplemental  O2)  Activity Tolerance: Patient tolerated treatment well Patient left: Other (comment) (with PT)  OT Visit Diagnosis: Unsteadiness on feet (R26.81);Muscle weakness (generalized) (M62.81)                Time: 1341-1350 OT Time Calculation (min): 9 min Charges:  OT General Charges $OT Visit: 1 Procedure OT Evaluation $OT Eval Moderate Complexity: 1 Procedure G-Codes:     Mychal Decarlo A. Ulice Brilliant, M.S., OTR/L Pager: [phone] 04/09/2017, 2:10 PM

## 2017-04-09 NOTE — Consult Note (Signed)
Cardiology Consult    Patient ID: [name] MRN: [id number], DOB/AGE: August 31, 1951   Admit date: 04/08/2017 Date of Consult: 04/09/2017  Primary Physician: Patient, No Pcp Per Reason for Consult: CHF Primary Cardiologist: Dr. Johnsie Cancel Dr. Aundra Dubin Requesting Provider: Dr. Posey Pronto  History of Present Illness    Caroline [name] is a 66 y.o. female who is being seen today for the evaluation of CHF at the request of Dr Posey Pronto.  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   The patient says that she gained approx 10 pounds and developed increased shortness of breath. She denies swelling. The patient had a recent admission for heart failure 03/09/17-03/13/17. She was diuresed with decrease in weight from 58.8 kg to 57.2 kg. At discharge her lasix was increased from previous 20 mg as needed to 40 mg daily. However, the patient has still been taking her lasix as needed for shortness of breath and she does not take it often. She had only taken maybe 2 doses for increase in shortness of breath prior to presenting to the hospital. She has chronic 2 pillow orthopnea. No chest pain, palpitations, lightheadedness and no recent dizziness upon standing.   She was last seen in the caridiology office by Dr. Johnsie Cancel on 12/31/16 and per his note "She recently had right and left heart cath (8/17), showing optimized LV and RV filling pressures and relatively preserved cardiac output.  She had severe stenoses in a large OM1 and a large D2.  Given elevated creatinine, PCI was not done and she was taken off the table for decision-making.  She subsequently had a TEE in 8/17 showed EF 25-30% with severe functional MR." Medication optimization has been limited by orthostatic hypotension which was worked up for adrenal insufficiency by endocrinology and thought  to be more related to psychotropic meds. The patient is also seen by Dr. Aundra Dubin in the CHF clinic but she canceled her last scheduled appointment on 5/18 and notes indicate that she has cancelled 4 appointments. She says that she lives alone and missed her last ppt due to lack on transportation. He daughter usually drives her to her appointments.   Past Medical History   Past Medical History:  Diagnosis Date  . Angina   . Anxiety   . Arthritis    "severe in my back" (07/02/2016)  . Atrial fibrillation ([place])   . Bipolar disorder ([place])   . Bright disease   . CAD (coronary artery disease)    [facility]  Diagonal instent restenosis treated 10/2005  . Cancer of right breast ([place]) February 07, 2001  . Carotid artery occlusion   . CHF (congestive heart failure) ([place])    states she was told se had this in past by MD  . Chronic back pain   . Depression    2002-02-07 had shock treatment due to depresson, spouse passed away 02-08-2000  . Dyslipidemia   . GERD (gastroesophageal reflux disease)   . Graves' disease    /notes 06/03/2010  . Heart murmur    states years ago  . Hypertension    states a long time ago-states she never took medication  . Non Q wave myocardial infarction ([place]) 03/2004   Archie Endo 06/03/2010  . Shortness of breath    with exertion    Past Surgical History:  Procedure Laterality  Date  . BREAST LUMPECTOMY Right 2002  . CARDIAC CATHETERIZATION N/A 07/03/2016   Procedure: Right/Left Heart Cath and Coronary Angiography;  Surgeon: Leonie Man, MD;  Location: [facility];  Service: Cardiovascular;  Laterality: N/A;  . CARDIAC CATHETERIZATION N/A 07/22/2016   Procedure: Coronary Stent Intervention;  Surgeon: Leonie Man, MD;  Location: [facility];  Service: Cardiovascular;  Laterality: N/A;  . CAROTID ENDARTERECTOMY  01/08/12   Left  . CORONARY ANGIOPLASTY  03/2009   Archie Endo 06/03/2010  . CORONARY ANGIOPLASTY WITH STENT PLACEMENT  03/2004   Archie Endo 06/03/2010;  . CORONARY  ANGIOPLASTY WITH STENT PLACEMENT      "I've got 3 stent in there" (07/02/2016)  . CORONARY STENT PLACEMENT  07/23/2016   STENT PROMUS PREM MR 2.5X12 drug eluting stent was successfully placed, and overlaps previously placed stent.  [name] ENDARTERECTOMY  01/08/2012   Procedure: ENDARTERECTOMY CAROTID;  Surgeon: Mal Misty, MD;  Location: [facility];  Service: Vascular;  Laterality: Left;  with Dacron patch angioplasty  . FRACTURE SURGERY  2007   Broken Back  . JOINT REPLACEMENT    . MULTIPLE TOOTH EXTRACTIONS  2016   "18 or 19"  . TEE WITHOUT CARDIOVERSION N/A 07/04/2016   Procedure: TRANSESOPHAGEAL ECHOCARDIOGRAM (TEE);  Surgeon: Larey Dresser, MD;  Location: [facility];  Service: Cardiovascular;  Laterality: N/A;  . TOTAL HIP ARTHROPLASTY Left 2011  . VAGINAL HYSTERECTOMY  1970s   partial     Allergies  No Known Allergies  Inpatient Medications    . aspirin EC  81 mg Oral Daily  . atorvastatin  40 mg Oral q1800  . clonazePAM  1 mg Oral TID  . clopidogrel  75 mg Oral Daily  . furosemide  40 mg Intravenous Q12H  . heparin  5,000 Units Subcutaneous Q8H  . potassium chloride  40 mEq Oral BID  . QUEtiapine  300 mg Oral BID  . QUEtiapine  400 mg Oral QHS  . risperiDONE  2 mg Oral TID  . sodium chloride flush  3 mL Intravenous Q12H    Family History    Family History  Problem Relation Age of Onset  . Coronary artery disease Father   . Prostate cancer Father        not sure  . Pancreatic cancer Father        not sure  . Cancer Father        Pancreatic and  Prostate  . Heart disease Father        Before age 35 and BPG  . Hyperlipidemia Father   . Heart attack Father   . Hypertension Mother   . Diabetes Sister   . Colon cancer Neg Hx      Social History    Social History   Social History  . Marital status: Widowed    Spouse name: N/A  . Number of children: N/A  . Years of education: N/A   Occupational History  . Not on file.   Social History Main Topics  .  Smoking status: Former Smoker    Packs/day: 0.75    Years: 49.00    Types: Cigarettes    Quit date: 08/04/2015  . Smokeless tobacco: Never Used  . Alcohol use No  . Drug use: No  . Sexual activity: No   Other Topics Concern  . Not on file   Social History Narrative   Lives in [place], [place] alone. Independent in ADLs, relies on friend for IADLs/groceries.  No family nearby. Unable to get transportation consistently to appointments. She has 1 daughter.  She is on disability.     Review of Systems   As per HPI All other systems reviewed and are otherwise negative except as noted above.  Physical Exam    Blood pressure (!) 85/51, pulse 87, temperature 97.8 F (36.6 C), temperature source Oral, resp. rate (!) 21, height 5\' 3"  (1.6 m), weight 136 lb 7.4 oz (61.9 kg), SpO2 100 %.  General: Elderly, chronically ill appearing female, NAD Psych: Flat affect. Neuro: Alert and oriented X 3. Moves all extremities spontaneously. HEENT: Normal  Neck: Mild JVD Lungs:  Resp regular and unlabored, bibasilar rales Heart: Irregularly irregular rhythm, 2/6 systolic murmur best appreciated in the left axillary region Abdomen: Soft, non-tender, non-distended, BS + x 4.  Extremities: No clubbing, cyanosis or edema. DP/PT/Radials 2+ and equal bilaterally.  Labs    Troponin (Point of Care Test)  Recent Labs  04/08/17 0330  TROPIPOC 0.04   No results for input(s): CKTOTAL, CKMB, TROPONINI in the last 72 hours. Lab Results  Component Value Date   WBC 5.9 04/09/2017   HGB 9.6 (L) 04/09/2017   HCT 29.5 (L) 04/09/2017   MCV 81.7 04/09/2017   PLT 258 04/09/2017    Recent Labs Lab 04/09/17 0215  NA 131*  K 3.1*  CL 89*  CO2 29  BUN 20  CREATININE 1.86*  CALCIUM 9.3  PROT 6.3*  BILITOT 0.8  ALKPHOS 85  ALT 20  AST 20  GLUCOSE 96   Lab Results  Component Value Date   CHOL 264 (H) 04/23/2012   HDL 67 04/23/2012   LDLCALC 172 (H) 04/23/2012   TRIG 126 04/23/2012   Lab Results    Component Value Date   DDIMER 0.81 (H) 04/08/2017     Radiology Studies    Dg Chest 2 View  Result Date: 04/08/2017 CLINICAL DATA:  Shortness of breath tonight. EXAM: CHEST  2 VIEW COMPARISON:  Radiograph 03/09/2017 FINDINGS: Unchanged heart size and mediastinal contours allowing for differences in technique, mild cardiomegaly. Coronary stent in place. Left pleural effusion has increased from prior exam. Right pleural effusion has improved. Diffuse interstitial prominence appears similar to prior exam allowing for differences in technique. No acute osseous abnormalities. IMPRESSION: Shifting pleural effusions with increased size on the left and decreased size on the right. Diffuse interstitial prominence appears similar to prior, may be interstitial edema or underlying emphysematous change. Electronically Signed   By: Jeb Levering M.D.   On: 04/08/2017 04:16    EKG & Cardiac Imaging    EKG: Sinus rhythm with Ventricular trigeminy, Prolonged PR interval, Low voltage extremity leads  Echocardiogram: 04/08/17 Study Conclusions  - Left ventricle: The cavity size was moderately dilated. Wall   thickness was normal. Systolic function was moderately to   severely reduced. The estimated ejection fraction was in the   range of 30% to 35%. Diffuse hypokinesis. Doppler parameters are   consistent with both elevated ventricular end-diastolic filling   pressure and elevated left atrial filling pressure. - Aortic valve: There was mild regurgitation. Valve area (VTI):   1.78 cm^2. Valve area (Vmax): 1.53 cm^2. Valve area (Vmean): 1.4   cm^2. - Mitral valve: There was severe regurgitation. - Left atrium: The atrium was moderately dilated. - Atrial septum: No defect or patent foramen ovale was identified. - Pulmonary arteries: PA peak pressure: 44 mm Hg (S).  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 Right and left heart cath (8/17), showed optimized LV and RV filling pressures and relatively preserved cardiac output.  -Echo done yesterday shows EF 30-35%, diffuse hypokinesis, elevated end-distolic filling pressure and elevated left atrial filling pressure, mild AR, severe MR, mod dilated LA -Pt has trouble tolerating cardiac meds due to orthostatic hypotension. PTA medical therapy included lasix 40 mg daily. No BB, ACEi or sprionolactone due to hypotension. -BNP 2543.  CXR shows shifting pleural effusions with increased size on the left and decreased size on the right. Diffuse interstitial prominence appears similar to prior, may be interstitial edema or underlying emphysematous change. -Currenlty diuresing with lasix 40 mg IV BID. Wt on admission yesterday was 129 lbs (58.9 kg) and this am is recorded at 136 lbs (61.9 kg) ?accuracy. Wt on discharge from previous hosp on 5/11 was 57.2 kg. Pt has had 3.7L UOP since admission -Continue to diurese as tolerated and monitor renal function. Strict I&O and daily wts. -Pt has been taking her lasix at home only as needed for shortness of breath (despite increase in dose after recent hospitalization in May for CHF) and sounds like she waited until she had already gained 10 pounds to take a dose. She will need to take her lasix more regularly, at least QOD and notify office for wt gain of 3 pounds in a day or 5 pounds in a week.   CAD -s/p DES to D2 and OM1 07/2016 -Aspirin and Plavix  CKD stage III -SCr 1.69 on admission, 1.86 today with IV diuresis. Continue to monitor.  Hypotension -Pt unable to tolerate cardiac meds due to hypotension. Pt has been worked up for adrenal insufficiency by endocrinology and thought to be more related to psychotropic meds.  Midodrine is on her med list, but she says that she does not take. BP running in the 90's-low 100's. Monitor closely while diuresing.   Severe MR -Echo yesterday  showed severe MR  Hypokalemia -K+ 3.3 on admission, 3.1 today. Supplementation per IM.   Hyperlipidemia -atorvasttin 40 mg  Tildon Husky, NP-C 04/09/2017, 10:28 AM Pager: [phone]  Patient examined chart reviewed. Flat affect , basilar crackles significant MR murmur trace edema Admitted with CHF. Her biggest issue is severe MR and inability to maximize meds due to low BP and compliance issues. Dr Aundra Dubin and I have discussed possible mitral clip procedure Don;t Think she is an operative candidate. For now try to optimize CHF Rx and iv diuresis until Cr is around 2.  Supplement K follow anemia  Jenkins Rouge

## 2017-04-09 NOTE — Progress Notes (Signed)
Patient had 16 beat run of bigeminy at 1732. RN just now made aware of situation at 1905. Patient asymptomatic. RN paged MD, Posey Pronto. RN will continue to monitor.

## 2017-04-09 NOTE — Evaluation (Signed)
Physical Therapy Evaluation Patient Details Name: Caroline [name] MRN: [id number] DOB: [birth year] Today's Date: 04/09/2017   History of Present Illness  Pt is a 66 y.o. female presenting with progressive dyspnea over the past two days with dizziness and fatigue. CXR-Shifting pleural effusions with increased size on the left and decreased on right. PMHx: Anxiety, Arthritis, Afib, Bipolar disorder, Bright disease, CAD, Carotid artery occlusion, CHF, Depression, GERD, Graves disease, HTN, MI, SOB with exertion.  Clinical Impression  Patient presents with generalized weakness, decreased balance and impaired mobility s/p above. Tolerated gait training with min guard assist for balance/safety. No SOB noted during mobility and SP02 remained >93% on RA. Pt difficult to understand at times due to mumbling speech. Pt lives alone but per MD note has difficulty managing meds. Still drives. Will need further cognitive assessment for higher level tasks. Will follow acutely to maximize independence and mobility prior to return home.     Follow Up Recommendations No PT follow up;Supervision for mobility/OOB    Equipment Recommendations  None recommended by PT    Recommendations for Other Services       Precautions / Restrictions Precautions Precautions: Fall Precaution Comments: watch BP Restrictions Weight Bearing Restrictions: No      Mobility  Bed Mobility Overal bed mobility: Needs Assistance Bed Mobility: Supine to Sit     Supine to sit: Supervision     General bed mobility comments: Standing at sink with OT upon PT arrival.  Transfers Overall transfer level: Needs assistance Equipment used: None Transfers: Sit to/from Stand Sit to Stand: Min guard         General transfer comment: Transferred to chair post ambulation; guarded.  Ambulation/Gait Ambulation/Gait assistance: Min guard Ambulation Distance (Feet): 150 Feet Assistive device: None Gait Pattern/deviations: Step-through  pattern;Decreased stride length;Drifts right/left Gait velocity: decreased Gait velocity interpretation: Below normal speed for age/gender General Gait Details: Slow, guarded and mildly unsteady gait with drifting noted but no overt LOB. BP soft but improved post activity. 117/74.  Stairs            Wheelchair Mobility    Modified Rankin (Stroke Patients Only)       Balance Overall balance assessment: Needs assistance Sitting-balance support: Feet supported;No upper extremity supported Sitting balance-Leahy Scale: Good     Standing balance support: During functional activity Standing balance-Leahy Scale: Fair Standing balance comment: Able to stand at sink and comb hair without LOB or difficulty.                              Pertinent Vitals/Pain Pain Assessment: No/denies pain    Home Living Family/patient expects to be discharged to:: Private residence Living Arrangements: Alone Available Help at Discharge: Friend(s);Available PRN/intermittently Type of Home: House Home Access: Stairs to enter Entrance Stairs-Rails: Right;Left Entrance Stairs-Number of Steps: 6 in front, 3 in back Home Layout: Two level;Bed/bath upstairs Home Equipment: [place] - 2 wheels;Cane - single point;Bedside commode;Shower seat      Prior Function Level of Independence: Independent         Comments: friend that brings groceries and takes her to appointments     Hand Dominance        Extremity/Trunk Assessment   Upper Extremity Assessment Upper Extremity Assessment: Defer to OT evaluation    Lower Extremity Assessment Lower Extremity Assessment: Generalized weakness       Communication   Communication: Other (comment) (mumbled speech- difficult to understand at times.)  Cognition Arousal/Alertness: Awake/alert Behavior During Therapy: Flat affect Overall Cognitive Status: Within Functional Limits for tasks assessed                                  General Comments: Pt gets easily frustrated when you cant understand what she is saying.      General Comments General comments (skin integrity, edema, etc.): Sp02 remained >93% on RA.     Exercises     Assessment/Plan    PT Assessment Patient needs continued PT services  PT Problem List Decreased mobility;Decreased strength;Decreased balance       PT Treatment Interventions Therapeutic activities;Gait training;Therapeutic exercise;Patient/family education;Balance training;Stair training;Functional mobility training    PT Goals (Current goals can be found in the Care Plan section)  Acute Rehab PT Goals Patient Stated Goal: none stated PT Goal Formulation: With patient Time For Goal Achievement: 04/23/17 Potential to Achieve Goals: Fair    Frequency Min 3X/week   Barriers to discharge Decreased caregiver support lives alone    Co-evaluation               AM-PAC PT "6 Clicks" Daily Activity  Outcome Measure Difficulty turning over in bed (including adjusting bedclothes, sheets and blankets)?: None Difficulty moving from lying on back to sitting on the side of the bed? : None Difficulty sitting down on and standing up from a chair with arms (e.g., wheelchair, bedside commode, etc,.)?: None Help needed moving to and from a bed to chair (including a wheelchair)?: A Little Help needed walking in hospital room?: A Little Help needed climbing 3-5 steps with a railing? : A Little 6 Click Score: 21    End of Session Equipment Utilized During Treatment: Gait belt Activity Tolerance: Patient tolerated treatment well Patient left: in chair;with call bell/phone within reach;with chair alarm set Nurse Communication: Mobility status PT Visit Diagnosis: Unsteadiness on feet (R26.81);Muscle weakness (generalized) (M62.81)    Time: 1350-1405 PT Time Calculation (min) (ACUTE ONLY): 15 min   Charges:   PT Evaluation $PT Eval Low Complexity: 1 Procedure     PT G Codes:         Wray Kearns, PT, DPT [phone]    Marguarite Arbour A Leahmarie Gasiorowski 04/09/2017, 2:22 PM

## 2017-04-10 LAB — BASIC METABOLIC PANEL
Anion gap: 11 (ref 5–15)
BUN: 23 mg/dL — AB (ref 6–20)
CHLORIDE: 90 mmol/L — AB (ref 101–111)
CO2: 27 mmol/L (ref 22–32)
CREATININE: 1.85 mg/dL — AB (ref 0.44–1.00)
Calcium: 9.3 mg/dL (ref 8.9–10.3)
GFR calc Af Amer: 32 mL/min — ABNORMAL LOW (ref >60)
GFR calc non Af Amer: 28 mL/min — ABNORMAL LOW (ref >60)
Glucose, Bld: 92 mg/dL (ref 65–99)
Potassium: 4.5 mmol/L (ref 3.5–5.1)
SODIUM: 128 mmol/L — AB (ref 135–145)

## 2017-04-10 LAB — MAGNESIUM: MAGNESIUM: 1.9 mg/dL (ref 1.7–2.4)

## 2017-04-10 NOTE — Progress Notes (Signed)
OT Cancellation Note  Patient Details Name: Caroline [name] MRN: [id number] DOB: [birth year]   Cancelled Treatment:    Reason Eval/Treat Not Completed: Fatigue/lethargy limiting ability to participate.  Pt deferred OT this am.  Will reattempt.  O'Brien, OTR/L [phone]   Lucille Passy M 04/10/2017, 12:10 PM

## 2017-04-10 NOTE — Progress Notes (Signed)
Physical Therapy Treatment Patient Details Name: Caroline [name] MRN: [id number] DOB: 06/26/1951 Today's Date: 04/10/2017    History of Present Illness Pt is a 66 y.o. female presenting with progressive dyspnea over the past two days with dizziness and fatigue. CXR-Shifting pleural effusions with increased size on the left and decreased on right. PMHx: Anxiety, Arthritis, Afib, Bipolar disorder, Bright disease, CAD, Carotid artery occlusion, CHF, Depression, GERD, Graves disease, HTN, MI, SOB with exertion.    PT Comments    Patient is progressing toward mobility goals and tolerated stair negotiation this session with rest break. Pt continues to be unsteady at times and practiced use of SPC this session. Continue to progress as tolerated.   Follow Up Recommendations  No PT follow up;Supervision for mobility/OOB     Equipment Recommendations  None recommended by PT    Recommendations for Other Services       Precautions / Restrictions Precautions Precautions: Fall Precaution Comments: watch BP; pt reported multiple falls at home    Mobility  Bed Mobility Overal bed mobility: Needs Assistance Bed Mobility: Sit to Supine       Sit to supine: Supervision   General bed mobility comments: increased time and effort  Transfers Overall transfer level: Needs assistance Equipment used: None Transfers: Sit to/from Stand Sit to Stand: Min guard         General transfer comment: min guard for safety  Ambulation/Gait Ambulation/Gait assistance: Min guard;Mod assist Ambulation Distance (Feet): 200 Feet Assistive device: Straight cane Gait Pattern/deviations: Step-through pattern;Decreased stride length Gait velocity: decreased   General Gait Details: initially ambulating with no AD and then practiced use of SPC; cues for sequencing and cadence; pt is very guarded with movements but improved steadiness with SPC until pt attempted looking down toward the ground and with significant  LOB requiring mod A to recover   Stairs Stairs: Yes   Stair Management: One rail Left;Step to pattern;With cane Number of Stairs: 10 General stair comments: pt cautious and good safety awareness on stairs; cues for step to pattern with rest break required before descending  Wheelchair Mobility    Modified Rankin (Stroke Patients Only)       Balance Overall balance assessment: Needs assistance Sitting-balance support: Feet supported;No upper extremity supported Sitting balance-Leahy Scale: Good     Standing balance support: During functional activity Standing balance-Leahy Scale: Fair                              Cognition Arousal/Alertness: Awake/alert Behavior During Therapy: Flat affect Overall Cognitive Status: Within Functional Limits for tasks assessed                                 General Comments: Pt gets easily frustrated when you cant understand what she is saying.      Exercises      General Comments General comments (skin integrity, edema, etc.): SpO2 and BP WNL       Pertinent Vitals/Pain Pain Assessment: No/denies pain    Home Living                      Prior Function            PT Goals (current goals can now be found in the care plan section) Progress towards PT goals: Progressing toward goals    Frequency    Min  3X/week      PT Plan Current plan remains appropriate    Co-evaluation              AM-PAC PT "6 Clicks" Daily Activity  Outcome Measure  Difficulty turning over in bed (including adjusting bedclothes, sheets and blankets)?: None Difficulty moving from lying on back to sitting on the side of the bed? : None Difficulty sitting down on and standing up from a chair with arms (e.g., wheelchair, bedside commode, etc,.)?: A Little Help needed moving to and from a bed to chair (including a wheelchair)?: A Little Help needed walking in hospital room?: A Little Help needed climbing  3-5 steps with a railing? : A Little 6 Click Score: 20    End of Session Equipment Utilized During Treatment: Gait belt Activity Tolerance: Patient tolerated treatment well Patient left: in bed;with call bell/phone within reach Nurse Communication: Mobility status PT Visit Diagnosis: Unsteadiness on feet (R26.81);Muscle weakness (generalized) (M62.81)     Time: 6389-3734 PT Time Calculation (min) (ACUTE ONLY): 29 min  Charges:  $Gait Training: 23-37 mins                    G Codes:       Earney Navy, PTA Pager: [phone]     Darliss Cheney 04/10/2017, 3:59 PM

## 2017-04-10 NOTE — Progress Notes (Signed)
Progress Note  Patient Name: Caroline [name] Date of Encounter: 04/10/2017  Primary Cardiologist: McLean/Mariann Palo  Subjective   Dyspnea only with exertion Working with PT and sat in chair for 2 hours  Affect slow   Inpatient Medications    Scheduled Meds: . aspirin EC  81 mg Oral Daily  . atorvastatin  40 mg Oral q1800  . clonazePAM  1 mg Oral TID  . clopidogrel  75 mg Oral Daily  . furosemide  40 mg Intravenous Q12H  . heparin  5,000 Units Subcutaneous Q8H  . potassium chloride  40 mEq Oral BID  . QUEtiapine  300 mg Oral BID  . QUEtiapine  400 mg Oral QHS  . risperiDONE  2 mg Oral TID  . sodium chloride flush  3 mL Intravenous Q12H   Continuous Infusions:  PRN Meds: acetaminophen **OR** acetaminophen   Vital Signs    Vitals:   04/09/17 2344 04/10/17 0427 04/10/17 0445 04/10/17 0748  BP: (!) 90/57  (!) 84/67 107/61  Pulse: 92  79   Resp:   16 20  Temp: 97.9 F (36.6 C)  98 F (36.7 C) 97.8 F (36.6 C)  TempSrc: Oral  Axillary Oral  SpO2: 98%  99% 99%  Weight:  136 lb 7.4 oz (61.9 kg)    Height:        Intake/Output Summary (Last 24 hours) at 04/10/17 1209 Last data filed at 04/10/17 1012  Gross per 24 hour  Intake                3 ml  Output             2400 ml  Net            -2397 ml   Filed Weights   04/08/17 1017 04/09/17 0500 04/10/17 0427  Weight: 129 lb 14.4 oz (58.9 kg) 136 lb 7.4 oz (61.9 kg) 136 lb 7.4 oz (61.9 kg)    Telemetry    NSR some NSVT asymptomatic - Personally Reviewed  ECG     -SR poor R wave progression PVCls  Personally Reviewed  Physical Exam  Slow affect  GEN: No acute distress.   Neck: No JVD Cardiac: RRR, MR murmurs, rubs, or gallops.  Respiratory: Clear to auscultation bilaterally. GI: Soft, nontender, non-distended  MS: No edema; No deformity. Neuro:  Nonfocal  Psych: Normal affect   Labs    Chemistry  Recent Labs Lab 04/08/17 0327 04/09/17 0215 04/10/17 0258  NA 124* 131* 128*  K 3.3* 3.1* 4.5  CL  82* 89* 90*  CO2 30 29 27   GLUCOSE 100* 96 92  BUN 18 20 23*  CREATININE 1.69* 1.86* 1.85*  CALCIUM 9.2 9.3 9.3  PROT  --  6.3*  --   ALBUMIN  --  3.3*  --   AST  --  20  --   ALT  --  20  --   ALKPHOS  --  85  --   BILITOT  --  0.8  --   GFRNONAA 31* 28* 28*  GFRAA 36* 32* 32*  ANIONGAP 12 13 11      Hematology  Recent Labs Lab 04/08/17 0327 04/09/17 0215  WBC 4.8 5.9  RBC 3.65* 3.61*  HGB 9.7* 9.6*  HCT 29.5* 29.5*  MCV 80.8 81.7  MCH 26.6 26.6  MCHC 32.9 32.5  RDW 15.5 15.9*  PLT 235 258    Cardiac EnzymesNo results for input(s): TROPONINI in the last 168 hours.  Recent Labs Lab 04/08/17 0330  TROPIPOC 0.04     BNP  Recent Labs Lab 04/08/17 0328  BNP 2,543.0*     DDimer   Recent Labs Lab 04/08/17 0327  DDIMER 0.81*     Radiology    No results found.  Cardiac Studies  Echo 04/08/17  EF 30-35% mild AR severe MR estimated PA 44 mmHg   Patient Profile     66 y.o. female who is being seen today for the evaluation of CHF at the request of Dr Posey Pronto.  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   Assessment & Plan    1) CHF:  Good diuresis don't think weight accurate  on iv lasix. 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 More likely a palliative care patient She has seen Dr Aundra Dubin but missed her last few appt's and does not want to see CHF clinic 2) CAD:  No chest pain troponin negative continue ASA/Plavix  She has not been on ACE/nitrates due to postural hypotension and was on midodrine in past Would clarify CODE status as it is listed as partial with no CPR but intubation.   Signed, Jenkins Rouge, MD  04/10/2017, 12:09 PM

## 2017-04-10 NOTE — Progress Notes (Signed)
Subjective:  She feels she is doing better today but is still concerned about her kidneys and her home management with lasix.  She denies any chest pain or lightheadedness today.  Her breathing is about the same from yesterday.  She was able to tolerate being off oxygen in the room sating at 100% but was very anxious about lowering it today.   Objective:  Vital signs in last 24 hours: Vitals:   04/09/17 2344 04/10/17 0427 04/10/17 0445 04/10/17 0748  BP: (!) 90/57  (!) 84/67 107/61  Pulse: 92  79   Resp:   16 20  Temp: 97.9 F (36.6 C)  98 F (36.7 C) 97.8 F (36.6 C)  TempSrc: Oral  Axillary Oral  SpO2: 98%  99% 99%  Weight:  61.9 kg (136 lb 7.4 oz)    Height:       Weight change: 2.978 kg (6 lb 9 oz)  Intake/Output Summary (Last 24 hours) at 04/10/17 1227 Last data filed at 04/10/17 1012  Gross per 24 hour  Intake                3 ml  Output             2400 ml  Net            -2397 ml   Gen: Resting comfortably in bed on 2 L [place]. Neuro:  Alert and oriented x3 HEENT: NCAT. EOMI. PERRLA. Moist mucous membranes.  CV: Regular rate and rhythm. 4/6 holosystolic murmur best appreciated at the the left fourth intercostal space with thrill over the same location. JVD up to jaw.  Pulm: Improving. Decreased pleural effusions to percussion. Bibasilar crackles bilaterally. No wheezes or rhonchi. Abdomen: Soft, non-tender to palpation in four quadrants.  Normal BS appreciated.  Extremities: No skin lesions appreciated.  Trace pitting LE edema.  Pulses in all extremities intact.  Assessment/Plan:  Principal Problem:   Acute on chronic systolic congestive heart failure (HCC) Active Problems:   Hyperlipidemia   ANXIETY DEPRESSION   GERD   Chronic renal disease, stage 3, moderately decreased glomerular filtration rate (GFR) between 30-59 mL/min/1.73 square meter   Severe mitral regurgitation  Ms. Machamer is a 66 yo woman with HFrEF (EF 30-35%), severe mitral regurgitation, CAD,  stage 3 CKD, HLD, anxiety/depression, and GERD who presents with a heart failure exacerbation due to medication non-compliance.   Acute on chronic HFrEF with Mitral Regurgitation: Her fluid status has improved, and she feels symptomatically better though has concerns about her oral diuretic as noted above. Not requiring oxygen as noted above. - Continue IV Lasix 40 mg BID with plan to transition to orals per Cardiology. - Check Mg today and recheck BMP, Mg to replete electrolytes as necessary  Hypotension: Holding midodrine for now given that she is well perfused on exam.   Social isolation- Patientlives alone, manages own medications incorrectly, and does not have transportation to appointments. No PT/OT needs.  -Follow-up social work recommendations for transportation  CKD Stage 3: Crt 1.7 at baseline, now up slightly to 1.85 but stable from yesterday.   -Diuresis and electrolytes as noted above  CAD, s/p DES x2 in Sept 2017 -Continue home Aspirin and Plavix  Mood disorder- Outpatient regimen of Seroquel, Risperdal, and Klonopin.  - continue home Seroquel 300 BID and 400 mg at night - continue home Risperdal - continue home Klonopin 1 mg TID  FEN/GI:Heart healthy diet. Replete electrolytes as needed DVT ppx: Lovenox Code status: DNR, but  would accept intubation for respiratory failure   LOS: 2 days   Doug Sou, Medical Student 04/10/2017, 12:27 PM   Attestation for Student Documentation:  I personally was present and performed or re-performed the history, physical exam and medical decision-making activities of this service and have verified that the service and findings are accurately documented in the student's note.  Riccardo Dubin, MD 04/10/2017, 12:54 PM

## 2017-04-11 LAB — BASIC METABOLIC PANEL
ANION GAP: 10 (ref 5–15)
ANION GAP: 9 (ref 5–15)
BUN: 23 mg/dL — ABNORMAL HIGH (ref 6–20)
BUN: 24 mg/dL — ABNORMAL HIGH (ref 6–20)
CALCIUM: 9.5 mg/dL (ref 8.9–10.3)
CALCIUM: 9.2 mg/dL (ref 8.9–10.3)
CO2: 25 mmol/L (ref 22–32)
CO2: 25 mmol/L (ref 22–32)
CREATININE: 1.81 mg/dL — AB (ref 0.44–1.00)
Chloride: 92 mmol/L — ABNORMAL LOW (ref 101–111)
Chloride: 90 mmol/L — ABNORMAL LOW (ref 101–111)
Creatinine, Ser: 1.93 mg/dL — ABNORMAL HIGH (ref 0.44–1.00)
GFR calc non Af Amer: 29 mL/min — ABNORMAL LOW (ref >60)
GFR, EST AFRICAN AMERICAN: 31 mL/min — AB (ref >60)
GFR, EST AFRICAN AMERICAN: 33 mL/min — AB (ref >60)
GFR, EST NON AFRICAN AMERICAN: 26 mL/min — AB (ref >60)
Glucose, Bld: 97 mg/dL (ref 65–99)
Glucose, Bld: 127 mg/dL — ABNORMAL HIGH (ref 65–99)
POTASSIUM: 5.7 mmol/L — AB (ref 3.5–5.1)
Potassium: 4.8 mmol/L (ref 3.5–5.1)
SODIUM: 124 mmol/L — AB (ref 135–145)
Sodium: 127 mmol/L — ABNORMAL LOW (ref 135–145)

## 2017-04-11 LAB — MAGNESIUM: Magnesium: 1.9 mg/dL (ref 1.7–2.4)

## 2017-04-11 MED ORDER — FUROSEMIDE 40 MG PO TABS
40.0000 mg | ORAL_TABLET | Freq: Two times a day (BID) | ORAL | Status: DC
  Administered 2017-04-11 – 2017-04-12 (×3): 40 mg via ORAL
  Filled 2017-04-11 (×4): qty 1

## 2017-04-11 NOTE — Progress Notes (Signed)
   Subjective: Patient reports not sleeping well overnight. She currently denies any trouble breathing, chest pain, or swelling in her legs or abdomen.  Objective:  Vital signs in last 24 hours: Vitals:   04/10/17 2046 04/10/17 2359 04/11/17 0410 04/11/17 0717  BP: (!) 96/58 100/67 106/62 98/64  Pulse:  86 83   Resp: 18 (!) 25 (!) 25   Temp:  98 F (36.7 C) 97.5 F (36.4 C) 97.8 F (36.6 C)  TempSrc:  Oral Oral Oral  SpO2:  100% 100%   Weight:   136 lb 0.4 oz (61.7 kg)   Height:       Weight change: -7.1 oz (-0.2 kg)  Intake/Output Summary (Last 24 hours) at 04/11/17 1037 Last data filed at 04/11/17 0719  Gross per 24 hour  Intake              240 ml  Output             1900 ml  Net            -1660 ml   Gen: Resting comfortably in bed on 2 L [place]. Neuro:  Flat affect. Alert and oriented x3 CV: Regular rate and rhythm. 4/6 holosystolic murmur best appreciated at the the left fourth intercostal space Pulm: Faint bibasilar crackles Abdomen: Soft, non-tender to palpation in four quadrants. Extremities: warm, no pedal edema  Assessment/Plan:  Principal Problem:   Acute on chronic systolic congestive heart failure (HCC) Active Problems:   Hyperlipidemia   ANXIETY DEPRESSION   GERD   Chronic renal disease, stage 3, moderately decreased glomerular filtration rate (GFR) between 30-59 mL/min/1.73 square meter   Severe mitral regurgitation  Ms. Kildow is a 66 yo woman with HFrEF (EF 30-35%), severe mitral regurgitation, CAD, stage 3 CKD, HLD, anxiety/depression, and GERD who presents with a heart failure exacerbation due to medication non-adherence.   Acute on chronic HFrEF with Mitral Regurgitation:  Diuresing well, net down 7.6 L. Weight unchanged. Medical management limited by chronic hypotension. Not a surgical candidate. IV lasix transitioned to orals beginning today. - Appreciate Cardiology assistance - Transitioned to Lasix po 40 mg BID - D/c'ed K supplement with K  5.7 this am - recheck K this afternoon  Social isolation- Patientlives alone, manages own medications incorrectly, and does not have transportation to appointments. -Follow-up social work recommendations for transportation  CKD Stage 3: Crt 1.7 at baseline, now up slightly to 1.9. Transition from IV to PO lasix. -Diuresis and electrolytes as noted above  CAD, s/p DES x2 in Sept 2017 -Continue home Aspirin and Plavix  Mood disorder- Outpatient regimen of Seroquel, Risperdal, and Klonopin.  - continue home Seroquel 300 BID and 400 mg at night - continue home Risperdal - continue home Klonopin 1 mg TID    LOS: 3 days   Zada Finders, MD 04/11/2017, 10:37 AM

## 2017-04-11 NOTE — Progress Notes (Signed)
Progress Note  Patient Name: Caroline [name] Date of Encounter: 04/11/2017  Primary Cardiologist: Jenkins Rouge MD  Subjective   Feels weak. States she walked a little yesterday. Denies SOB or chest pain.   Inpatient Medications    Scheduled Meds: . aspirin EC  81 mg Oral Daily  . atorvastatin  40 mg Oral q1800  . clonazePAM  1 mg Oral TID  . clopidogrel  75 mg Oral Daily  . furosemide  40 mg Intravenous Q12H  . heparin  5,000 Units Subcutaneous Q8H  . QUEtiapine  300 mg Oral BID  . QUEtiapine  400 mg Oral QHS  . risperiDONE  2 mg Oral TID  . sodium chloride flush  3 mL Intravenous Q12H   Continuous Infusions:  PRN Meds: acetaminophen **OR** acetaminophen   Vital Signs    Vitals:   04/10/17 2046 04/10/17 2359 04/11/17 0410 04/11/17 0717  BP: (!) 96/58 100/67 106/62 98/64  Pulse:  86 83   Resp: 18 (!) 25 (!) 25   Temp:  98 F (36.7 C) 97.5 F (36.4 C) 97.8 F (36.6 C)  TempSrc:  Oral Oral Oral  SpO2:  100% 100%   Weight:   136 lb 0.4 oz (61.7 kg)   Height:        Intake/Output Summary (Last 24 hours) at 04/11/17 1004 Last data filed at 04/11/17 0719  Gross per 24 hour  Intake              240 ml  Output             2050 ml  Net            -1810 ml   Filed Weights   04/09/17 0500 04/10/17 0427 04/11/17 0410  Weight: 136 lb 7.4 oz (61.9 kg) 136 lb 7.4 oz (61.9 kg) 136 lb 0.4 oz (61.7 kg)    Telemetry    NSR - Personally Reviewed  ECG    None today - Personally Reviewed  Physical Exam   GEN: No acute distress.  Oxygen on. Neck: JVD to 10 cm Cardiac: RRR, no murmurs, rubs, or gallops.  Respiratory: Clear to auscultation bilaterally. GI: Soft, nontender, non-distended  MS: No edema; No deformity. Neuro:  Nonfocal  Psych: Normal affect   Labs    Chemistry Recent Labs Lab 04/09/17 0215 04/10/17 0258 04/11/17 0246  NA 131* 128* 127*  K 3.1* 4.5 5.7*  CL 89* 90* 92*  CO2 29 27 25   GLUCOSE 96 92 97  BUN 20 23* 23*  CREATININE 1.86*  1.85* 1.93*  CALCIUM 9.3 9.3 9.5  PROT 6.3*  --   --   ALBUMIN 3.3*  --   --   AST 20  --   --   ALT 20  --   --   ALKPHOS 85  --   --   BILITOT 0.8  --   --   GFRNONAA 28* 28* 26*  GFRAA 32* 32* 31*  ANIONGAP 13 11 10      Hematology Recent Labs Lab 04/08/17 0327 04/09/17 0215  WBC 4.8 5.9  RBC 3.65* 3.61*  HGB 9.7* 9.6*  HCT 29.5* 29.5*  MCV 80.8 81.7  MCH 26.6 26.6  MCHC 32.9 32.5  RDW 15.5 15.9*  PLT 235 258    Cardiac EnzymesNo results for input(s): TROPONINI in the last 168 hours.  Recent Labs Lab 04/08/17 0330  TROPIPOC 0.04     BNP Recent Labs Lab 04/08/17 0328  BNP 2,543.0*  DDimer  Recent Labs Lab 04/08/17 0327  DDIMER 0.81*     Radiology    No results found.  Cardiac Studies   Echo 04/08/17:  Study Conclusions  - Left ventricle: The cavity size was moderately dilated. Wall   thickness was normal. Systolic function was moderately to   severely reduced. The estimated ejection fraction was in the   range of 30% to 35%. Diffuse hypokinesis. Doppler parameters are   consistent with both elevated ventricular end-diastolic filling   pressure and elevated left atrial filling pressure. - Aortic valve: There was mild regurgitation. Valve area (VTI):   1.78 cm^2. Valve area (Vmax): 1.53 cm^2. Valve area (Vmean): 1.4   cm^2. - Mitral valve: There was severe regurgitation. - Left atrium: The atrium was moderately dilated. - Atrial septum: No defect or patent foramen ovale was identified. - Pulmonary arteries: PA peak pressure: 44 mm Hg (S).  Patient Profile     66 y.o. 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 Admitted for worsening CHF.   Assessment & Plan    1. Acute on chronic systolic CHF. Good response to IV diuresis. I/O negative 7.6 liters since admission and 1.9  liters yesterday. This is not reflected in weight. Baseline weight appears to be around 130 lbs. She has severe MR and is not an operative candidate. Will transition to po lasix today. Monitor BUN/creatinine. Potassium held due to level 5.7 today. Not a candidate for ACEi/nitrates due to chronic postural hypotension.  2. CAD on ASA and Plavix.   Signed, Laure Leone [place], MD  04/11/2017, 10:04 AM  '

## 2017-04-12 DIAGNOSIS — Z7982 Long term (current) use of aspirin: Secondary | ICD-10-CM

## 2017-04-12 DIAGNOSIS — Z602 Problems related to living alone: Secondary | ICD-10-CM

## 2017-04-12 DIAGNOSIS — E871 Hypo-osmolality and hyponatremia: Secondary | ICD-10-CM

## 2017-04-12 DIAGNOSIS — F39 Unspecified mood [affective] disorder: Secondary | ICD-10-CM

## 2017-04-12 DIAGNOSIS — I9589 Other hypotension: Secondary | ICD-10-CM

## 2017-04-12 DIAGNOSIS — Z955 Presence of coronary angioplasty implant and graft: Secondary | ICD-10-CM

## 2017-04-12 DIAGNOSIS — L8990 Pressure ulcer of unspecified site, unspecified stage: Secondary | ICD-10-CM | POA: Insufficient documentation

## 2017-04-12 DIAGNOSIS — I2510 Atherosclerotic heart disease of native coronary artery without angina pectoris: Secondary | ICD-10-CM

## 2017-04-12 DIAGNOSIS — Z7902 Long term (current) use of antithrombotics/antiplatelets: Secondary | ICD-10-CM

## 2017-04-12 DIAGNOSIS — Z79899 Other long term (current) drug therapy: Secondary | ICD-10-CM

## 2017-04-12 LAB — BASIC METABOLIC PANEL
ANION GAP: 9 (ref 5–15)
BUN: 24 mg/dL — ABNORMAL HIGH (ref 6–20)
CO2: 25 mmol/L (ref 22–32)
Calcium: 9.1 mg/dL (ref 8.9–10.3)
Chloride: 91 mmol/L — ABNORMAL LOW (ref 101–111)
Creatinine, Ser: 1.86 mg/dL — ABNORMAL HIGH (ref 0.44–1.00)
GFR calc Af Amer: 32 mL/min — ABNORMAL LOW (ref >60)
GFR, EST NON AFRICAN AMERICAN: 28 mL/min — AB (ref >60)
GLUCOSE: 97 mg/dL (ref 65–99)
POTASSIUM: 4 mmol/L (ref 3.5–5.1)
Sodium: 125 mmol/L — ABNORMAL LOW (ref 135–145)

## 2017-04-12 LAB — OSMOLALITY, URINE: Osmolality, Ur: 154 mOsm/kg — ABNORMAL LOW (ref 300–900)

## 2017-04-12 LAB — OSMOLALITY: OSMOLALITY: 272 mosm/kg — AB (ref 275–295)

## 2017-04-12 LAB — SODIUM, URINE, RANDOM: Sodium, Ur: 37 mmol/L

## 2017-04-12 LAB — CREATININE, URINE, RANDOM: CREATININE, URINE: 16.24 mg/dL

## 2017-04-12 NOTE — Care Management Note (Signed)
Case Management Note  Patient Details  Name: Caroline [name] MRN: [id number] Date of Birth: [birth year]  Subjective/Objective:                  SOB Action/Plan: Discharge planning Expected Discharge Date:                  Expected Discharge Plan:  [place]  In-House Referral:     Discharge planning Services  CM Consult  Post Acute Care Choice:  Home Health Choice offered to:  Patient  DME Arranged:  N/A DME Agency:  NA  HH Arranged:  RN, Disease Management [place] Agency:  [place]  Status of Service:  Completed, signed off  If discussed at [place] of Stay Meetings, dates discussed:    Additional Comments: CM notified [facility] rep, Jermaine of pt impending discharge and per previous CM pt is setup with [facility] for [facility].  CM spoke with pt concerning her transportation problems to MD appointments and pt states she is ambulatory (able to get to a car from her home indep) and this CM has given pt the [place] [facility] [phone] to call (48 hours in advance of appt).  No other CM needs were communicated. Dellie Catholic, RN 04/12/2017, 10:49 AM

## 2017-04-12 NOTE — Progress Notes (Signed)
Subjective:  Patient says she was more SOB this morning.  However, says she was feeling fine when we were in the room.  Got up out of bed but did not ambulate yesterday.  She denies any CP or lightheadedness today.    Objective:  Vital signs in last 24 hours: Vitals:   04/12/17 0351 04/12/17 0738 04/12/17 0800 04/12/17 0837  BP: (!) 98/51 (!) 86/54 (!) 94/58   Pulse: 81  81   Resp: (!) 26  16   Temp: 98 F (36.7 C) 97.7 F (36.5 C)  97.7 F (36.5 C)  TempSrc: Oral Oral    SpO2: 100%  100%   Weight: 61.7 kg (136 lb 0.4 oz)     Height:       Weight change: 0 kg (0 lb)  Intake/Output Summary (Last 24 hours) at 04/12/17 0929 Last data filed at 04/12/17 0800  Gross per 24 hour  Intake              240 ml  Output             1850 ml  Net            -1610 ml   Gen: Lying comfortably in bed. Well-appearing in NAD.   Neuro:  Alert and oriented x3.  HEENT: NCAT. EOMI. Moist mucous membranes.  CV: RRR. 4/6 holosystolic murmur best appreciated at the the left fourth intercostal space with thrill.   Pulm:  CTAB, no wheezes or rhonchi. Abdomen: Soft, non-tender to palpation.   Extremities: No skin lesions appreciated.  No LE edema.     Assessment/Plan:  Principal Problem:   Acute on chronic systolic congestive heart failure (HCC) Active Problems:   Hyperlipidemia   ANXIETY DEPRESSION   GERD   Chronic renal disease, stage 3, moderately decreased glomerular filtration rate (GFR) between 30-59 mL/min/1.73 square meter   Severe mitral regurgitation   Ms. Howland is a 66 yo woman with HFrEF (EF 30-35%), severe mitral regurgitation, CAD, stage 3 CKD, HLD, anxiety/depression, and GERD who presents with a heart failure exacerbation due to medication non-adherence.  Anxiety and depression likely is playing a role in her continued care.   Acute on chronic HFrEF with Mitral Regurgitation:  Diuresing well, net down ~9 L. Weight unchanged. HF medical management limited by chronic  hypotension. Not a surgical candidate for MR. IV lasix transitioned to orals. - Appreciate Cardiology assistance - Continue Lasix po 40 mg BID - Will ask PT to work with patient today; if does well she may be discharged to home tomorrow - Transfer to telemetry  Hyponatremia: Serum Na trend 131>>128>>127>>124>>125. Initially suspected this related to hypervolemia in the setting of CHF, however would have expected some improvement with diuresis. Will workup while here. TSH was mildly elevated earlier this admission at 4.854. - Continue diuresis - Check urine Na, Osm, Urea and serum osm - BMET in am  Social isolation- Patientlives alone, manages own medications incorrectly, and does not have transportation to appointments. [facility] face to face ordered  CKD Stage 3: Crt 1.7 at baseline, now up slightly to 1.86. Transitioned from IV to PO lasix. -Diuresis and replete electrolytes as needed  CAD, s/p DES x2 in Sept 2017 -Continue home Aspirin and Plavix  Mood disorder- Outpatient regimen of Seroquel, Risperdal, and Klonopin.  - continue home Seroquel 300 BID and 400 mg at night - continue home Risperdal - continue home Klonopin 1 mg TID    LOS: 4 days  Doug Sou, Medical Student 04/12/2017, 9:29 AM  Attestation for Student Documentation:  I personally was present and performed or re-performed the history, physical exam and medical decision-making activities of this service and have verified that the service and findings are accurately documented in the student's note.  Zada Finders, MD 04/12/2017, 10:25 AM

## 2017-04-12 NOTE — Progress Notes (Signed)
Report called to RN 3E. Patient daughter informed of transfer and room number.

## 2017-04-12 NOTE — Progress Notes (Signed)
Physical Therapy Treatment Patient Details Name: Caroline [name] MRN: [id number] DOB: 09/26/1951 Today's Date: 04/12/2017    History of Present Illness Pt is a 66 y.o. female presenting with progressive dyspnea over the past two days with dizziness and fatigue. CXR-Shifting pleural effusions with increased size on the left and decreased on right. PMHx: Anxiety, Arthritis, Afib, Bipolar disorder, Bright disease, CAD, Carotid artery occlusion, CHF, Depression, GERD, Graves disease, HTN, MI, SOB with exertion.    PT Comments    Continuing work on functional mobility and activity tolerance;  Did well on room air; noting very slow gait as well as unsteadiness initially upon standing; tells me she has a cane at home; worth considering HHPT follow up, but I'm not sure that she is homebound; If not, perhaps Outpt PT for gait and balance dysfunction  Follow Up Recommendations  Other (comment) ( worth considering HHPT follow up, but I'm not sure that she is homebound; If not, perhaps Outpt PT for gait and balance dysfunction)     Equipment Recommendations  None recommended by PT    Recommendations for Other Services       Precautions / Restrictions Precautions Precautions: Fall Precaution Comments: watch BP; pt reported multiple falls at home    Mobility  Bed Mobility Overal bed mobility: Modified Independent             General bed mobility comments: increased time and effort  Transfers Overall transfer level: Needs assistance Equipment used: None Transfers: Sit to/from Stand Sit to Stand: Min guard         General transfer comment: min guard for safety; braced LEs against bed  Ambulation/Gait Ambulation/Gait assistance: Min guard Ambulation Distance (Feet): 200 Feet Assistive device: None Gait Pattern/deviations: Step-through pattern;Decreased step length - right;Decreased step length - left;Decreased stride length Gait velocity: decreased   General Gait Details: Pt  declined using cane; very short step length and very slow cadence   Stairs         General stair comments: pt declined  Wheelchair Mobility    Modified Rankin (Stroke Patients Only)       Balance     Sitting balance-Leahy Scale: Good       Standing balance-Leahy Scale: Fair                              Cognition Arousal/Alertness: Awake/alert Behavior During Therapy: Flat affect Overall Cognitive Status: Within Functional Limits for tasks assessed                                 General Comments: Pt gets easily frustrated when you cant understand what she is saying.      Exercises      General Comments General comments (skin integrity, edema, etc.): O2 sats ranged 91-99% on room air      Pertinent Vitals/Pain Pain Assessment: No/denies pain    Home Living                      Prior Function            PT Goals (current goals can now be found in the care plan section) Acute Rehab PT Goals Patient Stated Goal: none stated PT Goal Formulation: With patient Time For Goal Achievement: 04/23/17 Potential to Achieve Goals: Fair Progress towards PT goals: Progressing toward goals    Frequency  Min 3X/week      PT Plan Current plan remains appropriate    Co-evaluation              AM-PAC PT "6 Clicks" Daily Activity  Outcome Measure  Difficulty turning over in bed (including adjusting bedclothes, sheets and blankets)?: None Difficulty moving from lying on back to sitting on the side of the bed? : None Difficulty sitting down on and standing up from a chair with arms (e.g., wheelchair, bedside commode, etc,.)?: A Little Help needed moving to and from a bed to chair (including a wheelchair)?: A Little Help needed walking in hospital room?: A Little Help needed climbing 3-5 steps with a railing? : A Little 6 Click Score: 20    End of Session Equipment Utilized During Treatment: Gait belt Activity  Tolerance: Patient tolerated treatment well Patient left: in bed;with call bell/phone within reach Nurse Communication: Mobility status PT Visit Diagnosis: Unsteadiness on feet (R26.81);Muscle weakness (generalized) (M62.81)     Time: 1103-1594 PT Time Calculation (min) (ACUTE ONLY): 28 min  Charges:  $Gait Training: 23-37 mins                    G Codes:       Roney Marion, [place] Pager [phone] Office [place] 04/12/2017, 3:24 PM

## 2017-04-12 NOTE — Progress Notes (Signed)
Progress Note  Patient Name: Caroline [name] Date of Encounter: 04/12/2017  Primary Cardiologist: Jenkins Rouge MD  Subjective   States she is more SOB today. Did not ambulate yesterday.    Inpatient Medications    Scheduled Meds: . aspirin EC  81 mg Oral Daily  . atorvastatin  40 mg Oral q1800  . clonazePAM  1 mg Oral TID  . clopidogrel  75 mg Oral Daily  . furosemide  40 mg Oral BID  . heparin  5,000 Units Subcutaneous Q8H  . QUEtiapine  300 mg Oral BID  . QUEtiapine  400 mg Oral QHS  . risperiDONE  2 mg Oral TID  . sodium chloride flush  3 mL Intravenous Q12H   Continuous Infusions:  PRN Meds: acetaminophen **OR** acetaminophen   Vital Signs    Vitals:   04/12/17 0351 04/12/17 0738 04/12/17 0800 04/12/17 0837  BP: (!) 98/51 (!) 86/54 (!) 94/58   Pulse: 81  81   Resp: (!) 26  16   Temp: 98 F (36.7 C) 97.7 F (36.5 C)  97.7 F (36.5 C)  TempSrc: Oral Oral    SpO2: 100%  100%   Weight: 136 lb 0.4 oz (61.7 kg)     Height:        Intake/Output Summary (Last 24 hours) at 04/12/17 0850 Last data filed at 04/12/17 0800  Gross per 24 hour  Intake              480 ml  Output             1850 ml  Net            -1370 ml   Filed Weights   04/10/17 0427 04/11/17 0410 04/12/17 0351  Weight: 136 lb 7.4 oz (61.9 kg) 136 lb 0.4 oz (61.7 kg) 136 lb 0.4 oz (61.7 kg)    Telemetry    NSR, occ. PVC - Personally Reviewed  ECG    None today - Personally Reviewed  Physical Exam   GEN: No acute distress.  Oxygen on. Neck: JVD to 8 cm Cardiac: RRR, no murmurs, rubs, or gallops.  Respiratory: Clear to auscultation bilaterally. GI: Soft, nontender, non-distended  MS: No edema; No deformity. Neuro:  Nonfocal  Psych: Normal affect   Labs    Chemistry Recent Labs Lab 04/09/17 0215  04/11/17 0246 04/11/17 1536 04/12/17 0207  NA 131*  < > 127* 124* 125*  K 3.1*  < > 5.7* 4.8 4.0  CL 89*  < > 92* 90* 91*  CO2 29  < > 25 25 25   GLUCOSE 96  < > 97 127* 97    BUN 20  < > 23* 24* 24*  CREATININE 1.86*  < > 1.93* 1.81* 1.86*  CALCIUM 9.3  < > 9.5 9.2 9.1  PROT 6.3*  --   --   --   --   ALBUMIN 3.3*  --   --   --   --   AST 20  --   --   --   --   ALT 20  --   --   --   --   ALKPHOS 85  --   --   --   --   BILITOT 0.8  --   --   --   --   GFRNONAA 28*  < > 26* 29* 28*  GFRAA 32*  < > 31* 33* 32*  ANIONGAP 13  < > 10 9  9  < > = values in this interval not displayed.   Hematology  Recent Labs Lab 04/08/17 0327 04/09/17 0215  WBC 4.8 5.9  RBC 3.65* 3.61*  HGB 9.7* 9.6*  HCT 29.5* 29.5*  MCV 80.8 81.7  MCH 26.6 26.6  MCHC 32.9 32.5  RDW 15.5 15.9*  PLT 235 258    Cardiac EnzymesNo results for input(s): TROPONINI in the last 168 hours.   Recent Labs Lab 04/08/17 0330  TROPIPOC 0.04     BNP  Recent Labs Lab 04/08/17 0328  BNP 2,543.0*     DDimer   Recent Labs Lab 04/08/17 0327  DDIMER 0.81*     Radiology    No results found.  Cardiac Studies   Echo 04/08/17:  Study Conclusions  - Left ventricle: The cavity size was moderately dilated. Wall   thickness was normal. Systolic function was moderately to   severely reduced. The estimated ejection fraction was in the   range of 30% to 35%. Diffuse hypokinesis. Doppler parameters are   consistent with both elevated ventricular end-diastolic filling   pressure and elevated left atrial filling pressure. - Aortic valve: There was mild regurgitation. Valve area (VTI):   1.78 cm^2. Valve area (Vmax): 1.53 cm^2. Valve area (Vmean): 1.4   cm^2. - Mitral valve: There was severe regurgitation. - Left atrium: The atrium was moderately dilated. - Atrial septum: No defect or patent foramen ovale was identified. - Pulmonary arteries: PA peak pressure: 44 mm Hg (S).  Patient Profile     66 y.o. 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 Admitted for worsening CHF.   Assessment & Plan    1. Acute on chronic systolic CHF. Good response to IV diuresis. I/O negative 9 liters since admission and 1.2 liters yesterday on oral lasix. This is not reflected in weight. Baseline weight appears to be around 130 lbs. She has severe MR and is not an operative candidate. Will transition to po lasix today. Monitor BUN/creatinine. Renal function stable.  Potassium improved with holding po supplement.  Not a candidate for ACEi/nitrates due to chronic postural hypotension. I think her volume status now is good. I think some of her SOB is related to atelectasis from lying in bed all day. Needs to ambulate. Consider incentive spirometer. 2. CAD on ASA and Plavix.   Signed, Nakai Pollio [place], MD  04/12/2017, 8:50 AM  '

## 2017-04-13 DIAGNOSIS — K219 Gastro-esophageal reflux disease without esophagitis: Secondary | ICD-10-CM

## 2017-04-13 DIAGNOSIS — Z9114 Patient's other noncompliance with medication regimen: Secondary | ICD-10-CM

## 2017-04-13 DIAGNOSIS — I6529 Occlusion and stenosis of unspecified carotid artery: Secondary | ICD-10-CM

## 2017-04-13 DIAGNOSIS — F419 Anxiety disorder, unspecified: Secondary | ICD-10-CM

## 2017-04-13 DIAGNOSIS — L89151 Pressure ulcer of sacral region, stage 1: Secondary | ICD-10-CM

## 2017-04-13 DIAGNOSIS — E785 Hyperlipidemia, unspecified: Secondary | ICD-10-CM

## 2017-04-13 DIAGNOSIS — F341 Dysthymic disorder: Secondary | ICD-10-CM

## 2017-04-13 LAB — BASIC METABOLIC PANEL
ANION GAP: 10 (ref 5–15)
BUN: 26 mg/dL — ABNORMAL HIGH (ref 6–20)
CALCIUM: 9 mg/dL (ref 8.9–10.3)
CO2: 28 mmol/L (ref 22–32)
Chloride: 92 mmol/L — ABNORMAL LOW (ref 101–111)
Creatinine, Ser: 1.9 mg/dL — ABNORMAL HIGH (ref 0.44–1.00)
GFR, EST AFRICAN AMERICAN: 31 mL/min — AB (ref >60)
GFR, EST NON AFRICAN AMERICAN: 27 mL/min — AB (ref >60)
Glucose, Bld: 114 mg/dL — ABNORMAL HIGH (ref 65–99)
POTASSIUM: 3.5 mmol/L (ref 3.5–5.1)
SODIUM: 130 mmol/L — AB (ref 135–145)

## 2017-04-13 LAB — UREA NITROGEN, URINE: UREA NITROGEN UR: 105 mg/dL

## 2017-04-13 MED ORDER — POTASSIUM CHLORIDE CRYS ER 20 MEQ PO TBCR
20.0000 meq | EXTENDED_RELEASE_TABLET | Freq: Every day | ORAL | Status: DC
  Administered 2017-04-13: 20 meq via ORAL
  Filled 2017-04-13: qty 1

## 2017-04-13 MED ORDER — CLONAZEPAM 1 MG PO TABS: 1.0000 mg | ORAL_TABLET | Freq: Three times a day (TID) | ORAL | Status: AC

## 2017-04-13 MED ORDER — POTASSIUM CHLORIDE CRYS ER 20 MEQ PO TBCR: 20.0000 meq | EXTENDED_RELEASE_TABLET | Freq: Every day | ORAL | 0 refills | Status: DC

## 2017-04-13 MED ORDER — FUROSEMIDE 40 MG PO TABS: 40.0000 mg | ORAL_TABLET | Freq: Two times a day (BID) | ORAL | 1 refills | Status: DC

## 2017-04-13 NOTE — Discharge Instructions (Signed)
You were admitted to the hospital with shortness of breath due to pulmonary edema caused by your heart failure.  Because of your heart failure, if you do not take your fluid pill, lasix, fluid can build up in your lungs and other areas of your body.  It is important to prevent these episodes of shortness of breath in the future by taking your lasix medication as prescribed.  When you go home please take lasix 40mg  twice a day. Please also take the potassium supplement (KDur) once a day.  Pulmonary Edema Pulmonary edema (PE) is a condition in which fluid collects in the lungs. This makes it hard to breathe. PE may be a result of the heart not pumping very well or a result of injury. What are the causes?  Coronary artery disease causes blockages in the arteries of the heart. This deprives the heart muscle of oxygen and weakens the muscle. A heart attack is a form of coronary artery disease.  High blood pressure causes the heart muscle to work harder than usual. Over time, the heart muscle may get stiff, and it starts to work less efficiently. It may also fatigue and weaken.  Viral infection of the heart (myocarditis) may weaken the heart muscle.  Metabolic conditions such as thyroid disease, excessive alcohol use, certain vitamin deficiencies, or diabetes may also weaken the heart muscle.  Leaky or stiff heart valves may impair normal heart function.  Lung disease may strain the heart muscle.  Excessive demands on the heart such as too much salt or fluid intake.  Failure to take prescribed medicines.  Lung injury from heat or toxins, such as poisonous gas.  Infection in the lungs or other parts of the body.  Fluid overload caused by kidney failure or medicines. What are the signs or symptoms?  Shortness of breath at rest or with exertion.  Grunting, wheezing, or gurgling while breathing.  Feeling like you cannot get enough air.  Breaths are shallow and fast.  A lot of coughing  with frothy or bloody mucus.  Skin may become cool, damp, and turn a pale or bluish color. How is this diagnosed? Initial diagnosis may be based on your history, symptoms, and a physical examination. Additional tests for PE may include:  Electrocardiography.  Chest X-ray.  Blood tests.  Stress test.  Ultrasound evaluation of the heart (echocardiography).  Evaluation by a heart doctor (cardiologist).  Test of the heart arteries to look for blockages (angiography).  Check of blood oxygen.  How is this treated? Treatment of PE will depend on the underlying cause and will focus first on relieving the symptoms.  Extra oxygen to make breathing easier and assist with removing mucus. This may include breathing treatments or a tube into the lungs and a breathing machine.  Medicine to help the body get rid of extra water, usually through an IV tube.  Medicine to help the heart pump better.  If poor heart function is the cause, treatment may include: ? Procedures to open blocked arteries, repair damaged heart valves, or remove some of the damaged heart muscle. ? A pacemaker to help the heart pump with less effort.  Follow these instructions at home:  Your health care provider will help you determine what type of exercise program may be helpful. It is important to maintain strength and increase it if possible. Pace your activities to avoid shortness of breath or chest pain. Rest for at least 1 hour before and after meals. Cardiac rehabilitation programs are  available in some locations.  Eat a heart-healthy diet low in salt, saturated fat, and cholesterol. Ask for help with choices.  Make a list of every medicine, vitamin, or herbal supplement you are taking. Keep the list with you at all times. Show it to your health care provider at every visit and before starting a new medicine. Keep the list up to date.  Ask your health care provider or pharmacist to help you write a plan or  schedule so that you know things about each medicine such as: ? Why you are taking it. ? The possible side effects. ? The best time of day to take it. ? Foods to take with it or avoid. ? When to stop taking it.  Record your hospital or clinic weight. When you get home, compare it to your scale and record your weight. Then, weigh yourself first thing in the morning daily, and record the weights. You should weigh yourself every morning after you urinate and before you eat breakfast. Wear the same amount of clothing each time you weigh yourself. Provide your health care provider with your weight record. Daily weights are important in the early recognition of excess fluid. Tell your health care provider right away if you have gained 3 lb (1.4 kg) in 1 day, 5 lb (2.3 kg) in a week, or as directed by your health care provider. Your medicines may need to be adjusted.  Blood pressure monitoring should be done as often as directed. You can get a home blood pressure cuff at your drugstore. Record these values and bring them with you for your clinic visits. Notify your health care provider if you become dizzy or light-headed when standing up.  If you are currently a smoker, it is time to quit. Nicotine makes your heart work harder and is one of the leading causes of cardiac deaths. Do not use nicotine gum or patches before talking to your doctor.  Make a follow-up appointment with your health care provider as directed.  Ask your health care provider for a copy of your latest heart tracing (ECG) and keep a copy with you at all times. Get help right away if:  You have severe chest pain, especially if the pain is crushing or pressure-like and spreads to the arms, back, neck, or jaw. THIS IS AN EMERGENCY. Do not wait to see if the pain will go away. Call for local emergency medical help. Do not drive yourself to the hospital.  You have sweating, feel sick to your stomach (nauseous), or are experiencing  shortness of breath.  Your weight increases by 3 lb (1.4 kg) in 1 day or 5 lb (2.3 kg) in a week.  You notice increasing shortness of breath that is unusual for you. This may happen during rest, sleep, or with activity.  You develop chest pain (angina) or pain that is unusual for you.  You notice more swelling in your hands, feet, ankles, or abdomen.  You notice lasting (persistent) dizziness, blurred vision, headache, or unsteadiness.  You begin to cough up bloody mucus (sputum).  You are unable to sleep because it is hard to breathe.  You begin to feel a jumping or fluttering sensation (palpitations) in the chest that is unusual for you. This information is not intended to replace advice given to you by your health care provider. Make sure you discuss any questions you have with your health care provider. Document Released: 01/10/2003 Document Revised: 05/09/2016 Document Reviewed: 06/27/2013 Elsevier Interactive Patient Education  2017 [place].

## 2017-04-13 NOTE — Telephone Encounter (Signed)
F/U Call:  Patient daughter calling Eustaquio Maize), states that patient was told by her kidney doctor that her lasix medication affects her kidneys and that she does not need to take medication. Beth would like to verify if this id true or not, she will be leaving soon and states that you may leave a detailed message on her mobile # thanks.

## 2017-04-13 NOTE — Progress Notes (Signed)
Progress Note  Patient Name: Caroline [name] Date of Encounter: 04/13/2017  Primary Cardiologist: Dr. Johnsie Cancel / Dr. Aundra Dubin  Subjective   Sleepy. Just woke up. No chest pain or dyspnea.   Inpatient Medications    Scheduled Meds: . aspirin EC  81 mg Oral Daily  . atorvastatin  40 mg Oral q1800  . clonazePAM  1 mg Oral TID  . clopidogrel  75 mg Oral Daily  . furosemide  40 mg Oral BID  . heparin  5,000 Units Subcutaneous Q8H  . potassium chloride  20 mEq Oral Daily  . QUEtiapine  300 mg Oral BID  . QUEtiapine  400 mg Oral QHS  . risperiDONE  2 mg Oral TID  . sodium chloride flush  3 mL Intravenous Q12H   Continuous Infusions:  PRN Meds: acetaminophen **OR** acetaminophen   Vital Signs    Vitals:   04/12/17 1632 04/12/17 2122 04/13/17 0024 04/13/17 0349  BP: 125/67 (!) 90/43 (!) 87/52 (!) 118/59  Pulse: 94 80 63 86  Resp: 20 16 18 18   Temp: 97.9 F (36.6 C) 97.8 F (36.6 C) 98.2 F (36.8 C) 97.8 F (36.6 C)  TempSrc: Oral Oral Oral Oral  SpO2: 95% 97% 98% 100%  Weight: 123 lb 7.3 oz (56 kg)   123 lb 4.8 oz (55.9 kg)  Height: 5\' 3"  (1.6 m)       Intake/Output Summary (Last 24 hours) at 04/13/17 0926 Last data filed at 04/13/17 0908  Gross per 24 hour  Intake              840 ml  Output             2750 ml  Net            -1910 ml   Filed Weights   04/12/17 0351 04/12/17 1632 04/13/17 0349  Weight: 136 lb 0.4 oz (61.7 kg) 123 lb 7.3 oz (56 kg) 123 lb 4.8 oz (55.9 kg)    Telemetry    NSR with occasional PVC- Personally Reviewed  ECG    None today  - Personally Reviewed  Physical Exam   GEN: No acute distress.   Neck: + JVD Cardiac: RRR, 3/6 systolic murmurs, rubs, or gallops.  Respiratory: Clear to auscultation bilaterally. GI: Soft, nontender, non-distended  MS: No edema; No deformity. Neuro:  Nonfocal  Psych: Normal affect   Labs    Chemistry Recent Labs Lab 04/09/17 0215  04/11/17 1536 04/12/17 0207 04/13/17 0312  NA 131*  < > 124*  125* 130*  K 3.1*  < > 4.8 4.0 3.5  CL 89*  < > 90* 91* 92*  CO2 29  < > 25 25 28   GLUCOSE 96  < > 127* 97 114*  BUN 20  < > 24* 24* 26*  CREATININE 1.86*  < > 1.81* 1.86* 1.90*  CALCIUM 9.3  < > 9.2 9.1 9.0  PROT 6.3*  --   --   --   --   ALBUMIN 3.3*  --   --   --   --   AST 20  --   --   --   --   ALT 20  --   --   --   --   ALKPHOS 85  --   --   --   --   BILITOT 0.8  --   --   --   --   GFRNONAA 28*  < > 29* 28* 27*  GFRAA 32*  < > 33* 32* 31*  ANIONGAP 13  < > 9 9 10   < > = values in this interval not displayed.   Hematology Recent Labs Lab 04/08/17 0327 04/09/17 0215  WBC 4.8 5.9  RBC 3.65* 3.61*  HGB 9.7* 9.6*  HCT 29.5* 29.5*  MCV 80.8 81.7  MCH 26.6 26.6  MCHC 32.9 32.5  RDW 15.5 15.9*  PLT 235 258    Cardiac EnzymesNo results for input(s): TROPONINI in the last 168 hours.  Recent Labs Lab 04/08/17 0330  TROPIPOC 0.04     BNP Recent Labs Lab 04/08/17 0328  BNP 2,543.0*     DDimer  Recent Labs Lab 04/08/17 0327  DDIMER 0.81*     Radiology    No results found.  Cardiac Studies   Echo 04/08/17:  Study Conclusions  - Left ventricle: The cavity size was moderately dilated. Wall thickness was normal. Systolic function was moderately to severely reduced. The estimated ejection fraction was in the range of 30% to 35%. Diffuse hypokinesis. Doppler parameters are consistent with both elevated ventricular end-diastolic filling pressure and elevated left atrial filling pressure. - Aortic valve: There was mild regurgitation. Valve area (VTI): 1.78 cm^2. Valve area (Vmax): 1.53 cm^2. Valve area (Vmean): 1.4 cm^2. - Mitral valve: There was severe regurgitation. - Left atrium: The atrium was moderately dilated. - Atrial septum: No defect or patent foramen ovale was identified. - Pulmonary arteries: PA peak pressure: 44 mm Hg (S).  Patient Profile     66 y.o. 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 Admitted for worsening CHF  Assessment & Plan    1. Acute on chronic systolic CHF - BNP 4944 on admit. Net I & O negative 10L with 2.1L yesterday. Weight down 136-->123lb. Echo showed LVEF of 30-35% (improved from 25-30%) with elevated pressure.  - Volume status improved. Seems euvolemic.  - Not a candidate for ACEi/nitrates due to chronic postural hypotension. Continue PO lasix.   2. Severe MR - per not not an operative candidate.   3. CAD - Continue ASA, plavix and statin.   4. Acute on chronic CKD, stage III - Baseline Scr 1.7. Now increased gradually to 1.9.   Patient looks depressed. Management per primary team.   Signed, Leanor Kail, PA  04/13/2017, 9:26 AM

## 2017-04-13 NOTE — Progress Notes (Signed)
List of private caregivers given to patient as requested; Aneta Mins [phone]

## 2017-04-13 NOTE — Progress Notes (Signed)
Subjective:  Patient feels less SOB today.  She walked well with PT yesterday without desatting or having extreme SOB.  She does not have any CP, lightheadedness, orthopnea, or confusion.    Objective:  Vital signs in last 24 hours: Vitals:   04/12/17 1632 04/12/17 2122 04/13/17 0024 04/13/17 0349  BP: 125/67 (!) 90/43 (!) 87/52 (!) 118/59  Pulse: 94 80 63 86  Resp: 20 16 18 18   Temp: 97.9 F (36.6 C) 97.8 F (36.6 C) 98.2 F (36.8 C) 97.8 F (36.6 C)  TempSrc: Oral Oral Oral Oral  SpO2: 95% 97% 98% 100%  Weight: 56 kg (123 lb 7.3 oz)   55.9 kg (123 lb 4.8 oz)  Height: 5\' 3"  (1.6 m)      Weight change: -5.7 kg (-12 lb 9.1 oz)  Intake/Output Summary (Last 24 hours) at 04/13/17 0735 Last data filed at 04/13/17 0500  Gross per 24 hour  Intake              600 ml  Output             2750 ml  Net            -2150 ml   Gen: Well-appearing in NAD.  Resting comfortably in bed. Neuro:  Alert and oriented x3.  HEENT: NCAT. EOMI. Moist mucous membranes.  CV: RRR. 3/6 holosystolic murmur best appreciated at the the left fourth intercostal space.   Pulm:  CTAB, no wheezes or rhonchi. Abdomen: Soft, non-tender to palpation in four quadrants.   Extremities: No skin lesions appreciated.  No LE edema.    Assessment/Plan:  Principal Problem:   Acute on chronic systolic congestive heart failure (HCC) Active Problems:   Hyperlipidemia   ANXIETY DEPRESSION   GERD   Chronic renal disease, stage 3, moderately decreased glomerular filtration rate (GFR) between 30-59 mL/min/1.73 square meter   Severe mitral regurgitation   Pressure injury of skin  Ms. Deuser is a 67 yo woman with HFrEF (EF 30-35%), severe mitral regurgitation, CAD, stage 3 CKD, HLD, anxiety/depression, and GERD who presents with a heart failure exacerbation due to medication non-adherence.  Anxiety and depression likely is playing a role in her continued care.   Acute on chronic HFrEF with Mitral Regurgitation:  Diuresed well on oral lasix.  Net down ~11 L. HF medical management limited by chronic hypotension and she is not a surgical candidate for MR.  - Appreciate Cardiology assistance - Continue Lasix po 40 mg BID - Add KDur 20 mEq daily while on Lasix - Anticipate discharge today  Hyponatremia: Serum Na 131 on admission and 130 today. Appears chronic with multifactorial etiology.  HF, renal disease, possible mild hypothyroidism, and diuretic use could be contributing. She had a similar presentation on last admission with similar lab findings. Serum osm low (272) with FeUrea of 47% suggesting due to intrinsic renal disease. - Continue Lasix 40 mg po BID - repeat TSH as outpatient - discuss fluid and salt restriction   Depression / Mood disorder- Patient has had a flat affect throughout admission.  TSH mildly elevated in setting of acute illness.  She has an outpatient regimen of Seroquel, Risperdal, and Klonopin.  F/u as outpatient to assess possible hypothyroidism / depression.  - continue home Seroquel 300 BID and 400 mg at night - continue home Risperdal - continue home Klonopin 1 mg TID  Social isolation- HHRN and transportation arranged. Patientlives alone and manages own medications incorrectly. Case management provided resources for establishing  with a PCP near her home in [place].  CKD Stage 3: Crt 1.7 at baseline, now up slightly to 1.9.  -Diuresis and replete electrolytes as needed  CAD, s/p DES x2 in Sept 2017 -Continue home Aspirin and Plavix   LOS: 5 days   Doug Sou, Medical Student 04/13/2017, 7:35 AM  Attestation for Student Documentation:  I personally was present and performed or re-performed the history, physical exam and medical decision-making activities of this service and have verified that the service and findings are accurately documented in the student's note.  Zada Finders, MD 04/13/2017, 9:59 AM

## 2017-04-13 NOTE — Progress Notes (Signed)
  Date: 04/13/2017  Patient name: Caroline [name]  Medical record number: [id number]  Date of birth: [birth year]   I have seen and evaluated this patient and I have discussed the plan of care with the house staff. Please see Dr. Serita Grit note for complete details. I concur with his findings.   Patient has a small area of pressure injury on the sacrum per nursing which was dressed while here.  There is no wound present per report.  This was commented on in the discharge summary.   Sid Falcon, MD 04/13/2017, 10:39 AM

## 2017-04-13 NOTE — Care Management Important Message (Signed)
Important Message  Patient Details  Name: Caroline [name] MRN: [id number] Date of Birth: October 21, 1951   Medicare Important Message Given:  Yes    Orbie Pyo 04/13/2017, 12:06 PM

## 2017-04-14 NOTE — Telephone Encounter (Signed)
Patient's daughter talked to one of the doctors from our office yesterday at the hospital before patient was discharged. Patient's daughter thank me for the call back and that she does not have any other questions at this time.

## 2017-04-15 ENCOUNTER — Telehealth: Payer: Self-pay | Admitting: Cardiovascular Disease

## 2017-04-15 DIAGNOSIS — I5023 Acute on chronic systolic (congestive) heart failure: Secondary | ICD-10-CM | POA: Diagnosis not present

## 2017-04-15 NOTE — Telephone Encounter (Signed)
Follow Up:    Caroline [name] called back and said pt is also having issues with her balance. She would also like to have an order for Physical Therapy please.

## 2017-04-15 NOTE — Telephone Encounter (Signed)
New message      Calling to get verbal orders for skilled nursing and medical social work.  Pt was recently seen in the hosp

## 2017-04-15 NOTE — Telephone Encounter (Signed)
Donita from home health is requesting orders for patient. Informed Donita that these orders are usually done by patient's PCP. Patient does not have a PCP. At this time patient is suppose to follow up with [facility]. Patient does not have an appointment at this time with the [facility].  Donita stated she needed orders today. Will forward to Dr. Johnsie Cancel for advisement.

## 2017-04-16 ENCOUNTER — Telehealth: Payer: Self-pay

## 2017-04-16 NOTE — Telephone Encounter (Signed)
Donita from [facility] requesting VO. Please call back @ [phone]

## 2017-04-20 NOTE — Telephone Encounter (Signed)
Lm for nurse

## 2017-04-20 NOTE — Telephone Encounter (Signed)
Donita with [facility] requesting VO. Please call back.

## 2017-04-22 ENCOUNTER — Telehealth: Payer: Self-pay | Admitting: Physician Assistant

## 2017-04-22 NOTE — Telephone Encounter (Signed)
Returned Bigelow from [facility], call.  She is requesting a referral for pt to have a Education officer, museum and PT for her balance. Sharee Pimple was advised that pt was scheduled to see Nell Range, PA-C tomorrow, 04/23/17, and we will address at that time, since pt has never saw [facility] before. Sharee Pimple agreeable to that plan and a verbal, if ok'd, will be phoned into [facility] (ask for nursing)

## 2017-04-22 NOTE — Telephone Encounter (Signed)
New message    Caroline [name] from [employer] is calling for orders for pt for a Education officer, museum and PT from their company. Please call.

## 2017-04-22 NOTE — Progress Notes (Signed)
Cardiology Office Note    Date:  04/23/2017   ID:  Caroline [name], DOB 08/25/1951, MRN [id number]  PCP:  Patient, No Pcp Per  Cardiologist:  Dr. Johnsie Cancel   CC: post hospital follow up  History of Present Illness:  Caroline [name] is a 66 y.o. 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   She has a h/o CAD with PCI to diagonal with later in-stent restenosis and POBA to diagonal in 2011.  She recently had right and left heart cath (06/2016), showing optimized LV and RV filling pressures and relatively preserved cardiac output.  She had severe stenoses in a large OM1 and a large D2.  Given elevated creatinine, PCI was not done and she was taken off the table for decision-making.  She subsequently had a TEE in 8/17 showed EF 25-30% with severe functional MR.  She was seen CHF clinic by Dr Aundra Dubin and he had concern that dyspnea was anginal equivalent and he arranged PCI. She underwent DES to D2 and OM1 by Dr Ellyn Hack on 07/22/16.      Her functional capacity is fairly poor. She lives alone.  She is limited by back pain and also has severe depression on multiple psychotropic meds. Orthostatic hypotension has limited our ability to get her on an ideal cardiac regimen.   She was admitted 03/09/17-03/13/17 for A/C combined S/D CHF. She was diuresed with decrease in weight from 129.6 kg to 126 kg. At discharge her lasix was increased from previous 20 mg as needed to 40 mg daily. However, she did not take this as prescribed and only took PRN for SOB.   She then represented with weight gain and SOB. She was admitted 6/6-6/11/18 for A/C CHF. ECHO on 6/6 showed EF 30-35% with severe MR. She is not felt to be a  surgical candidate for her MR. She diuresed well on IV lasix 40mg  daily. She had a net output of 11L during her stay; discharge weight 123 lbs.  Due to her chronic hypotension she cannot tolerate evidence-based HF medications such as B-blockers and ACE-I. Her serum Na was 131 on presentation and dropped to 124 during admission w/o improvement with diuresis. Her hyponatremia is chronic with multifactorial etiology. She was discharged on lasix 40mg  daily.   Today he presents to clinic for follow up. Here with her only daughter who lives in [place], [place]. She drives her to her appointments. Caroline [name] continues to have SOB and couldn't sleep last night because of orthopnea and PND. She also feels like her abdomen is tight. No LE edema or weight gain. No chest pain. No syncope. No blood in stool or urine.    Past Medical History:  Diagnosis Date  . Angina   . Anxiety   . Arthritis    "severe in my back" (07/02/2016)  . Atrial fibrillation ([facility])   . Bipolar disorder ([facility])   . Bright disease   . CAD (coronary artery disease)    [facility]  Diagonal instent restenosis treated 10/2005  . Cancer of right breast ([facility]) 2002  . Carotid artery occlusion   . CHF (congestive heart failure) ([facility])    states she was told se had this in past by MD  .  Chronic back pain   . Depression    February 21, 2002 had shock treatment due to depresson, spouse passed away 2000-02-22  . Dyslipidemia   . GERD (gastroesophageal reflux disease)   . Graves' disease    /notes 06/03/2010  . Heart murmur    states years ago  . Hypertension    states a long time ago-states she never took medication  . Non Q wave myocardial infarction ([facility]) 03/2004   Archie Endo 06/03/2010  . Shortness of breath    with exertion    Past Surgical History:  Procedure Laterality Date  . BREAST LUMPECTOMY Right 02-21-01  . CARDIAC CATHETERIZATION N/A 07/03/2016   Procedure: Right/Left Heart Cath and Coronary Angiography;  Surgeon: Leonie Man, MD;  Location: [facility];  Service: Cardiovascular;  Laterality: N/A;  . CARDIAC CATHETERIZATION N/A 07/22/2016   Procedure: Coronary Stent Intervention;  Surgeon: Leonie Man, MD;  Location: [facility];  Service: Cardiovascular;  Laterality: N/A;  . CAROTID ENDARTERECTOMY  01/08/12   Left  . CORONARY ANGIOPLASTY  03/2009   Archie Endo 06/03/2010  . CORONARY ANGIOPLASTY WITH STENT PLACEMENT  03/2004   Archie Endo 06/03/2010;  . CORONARY ANGIOPLASTY WITH STENT PLACEMENT      "I've got 3 stent in there" (07/02/2016)  . CORONARY STENT PLACEMENT  07/23/2016   STENT PROMUS PREM MR 2.5X12 drug eluting stent was successfully placed, and overlaps previously placed stent.  [name] ENDARTERECTOMY  01/08/2012   Procedure: ENDARTERECTOMY CAROTID;  Surgeon: Mal Misty, MD;  Location: [facility];  Service: Vascular;  Laterality: Left;  with Dacron patch angioplasty  . FRACTURE SURGERY  02-21-06   Broken Back  . JOINT REPLACEMENT    . MULTIPLE TOOTH EXTRACTIONS  22-Feb-2015   "18 or 19"  . TEE WITHOUT CARDIOVERSION N/A 07/04/2016   Procedure: TRANSESOPHAGEAL ECHOCARDIOGRAM (TEE);  Surgeon: Larey Dresser, MD;  Location: [facility];  Service: Cardiovascular;  Laterality: N/A;  . TOTAL HIP ARTHROPLASTY Left February 21, 2010  . VAGINAL HYSTERECTOMY  1970s   partial    Current Medications: Outpatient Medications Prior to Visit  Medication Sig Dispense Refill  . aspirin 81 MG tablet Take 1 tablet (81 mg total) by mouth daily.    . clonazePAM (KLONOPIN) 1 MG tablet Take 1 tablet (1 mg total) by mouth 3 (three) times daily.    . clopidogrel (PLAVIX) 75 MG tablet Take ONE (1) tablet by mouth once daily 90 tablet 3  . ferrous sulfate 325 (65 FE) MG tablet Take 325 mg by mouth daily with breakfast.    . nitroGLYCERIN (NITROSTAT) 0.4 MG SL tablet Place 1 tablet (0.4 mg total) under the tongue every 5 (five) minutes as needed for chest pain. 25 tablet 3  . potassium chloride SA (K-DUR,KLOR-CON) 20 MEQ tablet Take 1 tablet (20 mEq total) by mouth daily.  60 tablet 0  . QUEtiapine (SEROQUEL) 300 MG tablet Take 300 mg by mouth 2 (two) times daily.    . QUEtiapine (SEROQUEL) 400 MG tablet Take 400 mg by mouth at bedtime.    . risperiDONE (RISPERDAL) 2 MG tablet Take 2 mg by mouth 3 (three) times daily.    . furosemide (LASIX) 40 MG tablet Take 1 tablet (40 mg total) by mouth 2 (two) times daily. 60 tablet 1  . atorvastatin (LIPITOR) 40 MG tablet Take 1 tablet (40 mg total) by mouth daily at 6 PM. (Patient not taking: Reported on 03/09/2017) 30 tablet 0  . feeding supplement, ENSURE ENLIVE, (ENSURE ENLIVE)  LIQD Take 237 mLs by mouth 2 (two) times daily after a meal. (Patient not taking: Reported on 03/09/2017) 237 mL 12   No facility-administered medications prior to visit.      Allergies:   Patient has no known allergies.   Social History   Social History  . Marital status: Widowed    Spouse name: N/A  . Number of children: N/A  . Years of education: N/A   Social History Main Topics  . Smoking status: Former Smoker    Packs/day: 0.75    Years: 49.00    Types: Cigarettes    Quit date: 08/04/2015  . Smokeless tobacco: Never Used  . Alcohol use No  . Drug use: No  . Sexual activity: No   Other Topics Concern  . None   Social History Narrative   Lives in [place], [place] alone. Independent in ADLs, relies on friend for IADLs/groceries. No family nearby. Unable to get transportation consistently to appointments. She has 1 daughter.  She is on disability.     Family History:  The patient's family history includes Cancer in her father; Coronary artery disease in her father; Diabetes in her sister; Heart attack in her father; Heart disease in her father; Hyperlipidemia in her father; Hypertension in her mother; Pancreatic cancer in her father; Prostate cancer in her father.       ROS:   Please see the history of present illness.    ROS All other systems reviewed and are negative.   PHYSICAL EXAM:   VS:  BP 98/68   Pulse 94   Ht 5\' 3"   (1.6 m)   Wt 123 lb (55.8 kg)   SpO2 97%   BMI 21.79 kg/m    GEN: Well nourished, well developed, in no acute distress flat affect HEENT: normal  Neck: + JVD, carotid bruits, or masses Cardiac: RRR;  Very soft murmur at apex with S3 gallop, No rubs, or gallops,no edema  Respiratory:  clear to auscultation bilaterally, normal work of breathing GI: soft, nontender, nondistended, + BS MS: no deformity or atrophy  Skin: warm and dry, no rash Neuro:  Alert and Oriented x 3, Strength and sensation are intact Psych: euthymic mood, full affect    Wt Readings from Last 3 Encounters:  04/23/17 123 lb (55.8 kg)  04/13/17 123 lb 4.8 oz (55.9 kg)  03/11/17 126 lb 1.6 oz (57.2 kg)      Studies/Labs Reviewed:   EKG:  EKG is NOT ordered today.    Recent Labs: 04/08/2017: B Natriuretic Peptide 2,543.0; TSH 4.854 04/09/2017: ALT 20; Hemoglobin 9.6; Platelets 258 04/11/2017: Magnesium 1.9 04/13/2017: BUN 26; Creatinine, Ser 1.90; Potassium 3.5; Sodium 130   Lipid Panel    Component Value Date/Time   CHOL 264 (H) 04/23/2012 1420   TRIG 126 04/23/2012 1420   HDL 67 04/23/2012 1420   CHOLHDL 3.9 04/23/2012 1420   VLDL 25 04/23/2012 1420   LDLCALC 172 (H) 04/23/2012 1420    Additional studies/ records that were reviewed today include:     - LHC (8/17) with 80% OM1 stenosis, 95% D2 stenosis.   - RHC (8/17): mean RA 4, PA 32.9, mean PCWP 10, CI 2.7 Fick, 2.08 thermodilution.  - TEE (8/17): EF 25-30%, moderately dilated LV, RV normal size and systolic function, severe central mitral regurgitation (suspect functional).   ASSESSMENT & PLAN:   Chronic systolic CHF with severe MR: weight is stable but she has some worsening orthopnea, abdominal distension and PND. Weight it actually  stable at 123 lbs. JVD is elevated. Will check BNP and BMET today. Will increase lasix from 40mg  BID to 60mg  BID and see if this helps  CAD: continue ASA/plavix, statin. No BB given ortho stasis   Orthostatic  hypotension: continue midodrine   CKD 2/2 Brights disease: followed at [facility]. Will check BMET today  HLD: continue statin   Carotid artery stenosis: continue asa and statin   Chronic mood disorder and depression: chronic and stable. This does complicate her treatment. Discussed the importance of establishing with a PCP. [facility]'t taking new patients until July. They will call [facility]    Medication Adjustments/Labs and Tests Ordered: Current medicines are reviewed at length with the patient today.  Concerns regarding medicines are outlined above.  Medication changes, Labs and Tests ordered today are listed in the Patient Instructions below. Patient Instructions  Medication Instructions:  Your physician has recommended you make the following change in your medication:  1.  INCREASE the Lasix to 40 mg taking 1 1/2 tablet twice a day   Labwork: TODAY:  PRO BNP & BMET  Testing/Procedures: None ordered  Follow-Up: Your physician recommends that you schedule a follow-up appointment in: 05/11/17 ARRIVE AT 2:30 TO SEE DR. NISHAN   Any Other Special Instructions Will Be Listed Below (If Applicable).     If you need a refill on your cardiac medications before your next appointment, please call your pharmacy.      Signed, Angelena Form, PA-C  04/23/2017 3:21 PM    [facility] [place], [place], [place]  [address] Phone: [phone]; Fax: [phone]

## 2017-04-23 ENCOUNTER — Encounter (INDEPENDENT_AMBULATORY_CARE_PROVIDER_SITE_OTHER): Payer: Self-pay

## 2017-04-23 ENCOUNTER — Ambulatory Visit (INDEPENDENT_AMBULATORY_CARE_PROVIDER_SITE_OTHER): Payer: Medicare Other | Admitting: Physician Assistant

## 2017-04-23 ENCOUNTER — Encounter: Payer: Self-pay | Admitting: Physician Assistant

## 2017-04-23 VITALS — BP 98/68 | HR 94 | Ht 63.0 in | Wt 123.0 lb

## 2017-04-23 DIAGNOSIS — I739 Peripheral vascular disease, unspecified: Secondary | ICD-10-CM

## 2017-04-23 DIAGNOSIS — E785 Hyperlipidemia, unspecified: Secondary | ICD-10-CM | POA: Diagnosis not present

## 2017-04-23 DIAGNOSIS — I951 Orthostatic hypotension: Secondary | ICD-10-CM | POA: Diagnosis not present

## 2017-04-23 DIAGNOSIS — N189 Chronic kidney disease, unspecified: Secondary | ICD-10-CM

## 2017-04-23 DIAGNOSIS — I2510 Atherosclerotic heart disease of native coronary artery without angina pectoris: Secondary | ICD-10-CM

## 2017-04-23 DIAGNOSIS — I5022 Chronic systolic (congestive) heart failure: Secondary | ICD-10-CM

## 2017-04-23 DIAGNOSIS — F349 Persistent mood [affective] disorder, unspecified: Secondary | ICD-10-CM

## 2017-04-23 DIAGNOSIS — I779 Disorder of arteries and arterioles, unspecified: Secondary | ICD-10-CM

## 2017-04-23 DIAGNOSIS — F39 Unspecified mood [affective] disorder: Secondary | ICD-10-CM

## 2017-04-23 MED ORDER — FUROSEMIDE 40 MG PO TABS: 60.0000 mg | ORAL_TABLET | Freq: Every day | ORAL | 0 refills | Status: DC

## 2017-04-23 NOTE — Telephone Encounter (Signed)
Paulino Door with [facility] and gave her the verbal order for PT for pts balance and for Social Work, ok'd by Nell Range, PA-C.

## 2017-04-23 NOTE — Telephone Encounter (Signed)
Called ph x 2 sounded as if answered but then went dead???

## 2017-04-23 NOTE — Patient Instructions (Addendum)
Medication Instructions:  Your physician has recommended you make the following change in your medication:  1.  INCREASE the Lasix to 40 mg taking 1 1/2 tablet twice a day   Labwork: TODAY:  PRO BNP & BMET  Testing/Procedures: None ordered  Follow-Up: Your physician recommends that you schedule a follow-up appointment in: 05/11/17 ARRIVE AT 2:30 TO SEE DR. NISHAN   Any Other Special Instructions Will Be Listed Below (If Applicable).     If you need a refill on your cardiac medications before your next appointment, please call your pharmacy.

## 2017-04-24 ENCOUNTER — Telehealth: Payer: Self-pay | Admitting: Physician Assistant

## 2017-04-24 ENCOUNTER — Telehealth: Payer: Self-pay | Admitting: Cardiovascular Disease

## 2017-04-24 DIAGNOSIS — Z79899 Other long term (current) drug therapy: Secondary | ICD-10-CM

## 2017-04-24 LAB — BASIC METABOLIC PANEL
BUN / CREAT RATIO: 17 (ref 12–28)
BUN: 27 mg/dL (ref 8–27)
CO2: 25 mmol/L (ref 20–29)
Calcium: 9.8 mg/dL (ref 8.7–10.3)
Chloride: 89 mmol/L — ABNORMAL LOW (ref 96–106)
Creatinine, Ser: 1.61 mg/dL — ABNORMAL HIGH (ref 0.57–1.00)
GFR calc Af Amer: 39 mL/min/{1.73_m2} — ABNORMAL LOW (ref >59)
GFR, EST NON AFRICAN AMERICAN: 34 mL/min/{1.73_m2} — AB (ref >59)
Glucose: 100 mg/dL — ABNORMAL HIGH (ref 65–99)
POTASSIUM: 3.7 mmol/L (ref 3.5–5.2)
SODIUM: 131 mmol/L — AB (ref 134–144)

## 2017-04-24 LAB — PRO B NATRIURETIC PEPTIDE: NT-Pro BNP: 10574 pg/mL — ABNORMAL HIGH (ref 0–287)

## 2017-04-24 MED ORDER — FUROSEMIDE 40 MG PO TABS: 80.0000 mg | ORAL_TABLET | Freq: Two times a day (BID) | ORAL | 1 refills | Status: DC

## 2017-04-24 MED ORDER — POTASSIUM CHLORIDE CRYS ER 20 MEQ PO TBCR: 20.0000 meq | EXTENDED_RELEASE_TABLET | Freq: Two times a day (BID) | ORAL | 1 refills | Status: DC

## 2017-04-24 NOTE — Telephone Encounter (Signed)
New message ° ° ° ° °Returning a call to the nurse to get lab results °

## 2017-04-24 NOTE — Telephone Encounter (Signed)
Called pt to inform her that her medication has been sent to her pharmacy, per Jeanann Lewandowsky, [place]      04/24/17 1:19 PM  Note    ----- Message from Eileen Stanford, PA-C sent at 04/24/2017  9:33 AM EDT ----- Dr. Johnsie Cancel is out of town, her BNP is VERY elevated. Her creat looks better. Can you have her increase lasix from 60mg  BID to 80mg  BID and lets get repeat BMET next week      I advised the pt that if she has any other problems, questions or concerns to call the office. Pt verbalized understanding.

## 2017-04-24 NOTE — Telephone Encounter (Signed)
New message     *STAT* If patient is at the pharmacy, call can be transferred to refill team.   1. Which medications need to be refilled? (please list name of each medication and dose if known) lasix 40 mg   2. Which pharmacy/location (including street and city if local pharmacy) is medication to be sent to? [facility]   3. Do they need a 30 day or 90 day supply? 90 day

## 2017-04-24 NOTE — Telephone Encounter (Signed)
-----   Message from Caroline Stanford, PA-C sent at 04/24/2017  9:33 AM EDT ----- Dr. Johnsie Cancel is out of town, her BNP is VERY elevated. Her creat looks better. Can you have her increase lasix from 60mg  BID to 80mg  BID and lets get repeat BMET next week

## 2017-04-25 DIAGNOSIS — I6521 Occlusion and stenosis of right carotid artery: Secondary | ICD-10-CM | POA: Diagnosis present

## 2017-04-25 DIAGNOSIS — F319 Bipolar disorder, unspecified: Secondary | ICD-10-CM | POA: Diagnosis present

## 2017-04-25 DIAGNOSIS — E440 Moderate protein-calorie malnutrition: Secondary | ICD-10-CM | POA: Diagnosis present

## 2017-04-25 DIAGNOSIS — I420 Dilated cardiomyopathy: Secondary | ICD-10-CM | POA: Diagnosis present

## 2017-04-25 DIAGNOSIS — Z515 Encounter for palliative care: Secondary | ICD-10-CM | POA: Diagnosis not present

## 2017-04-25 DIAGNOSIS — E785 Hyperlipidemia, unspecified: Secondary | ICD-10-CM | POA: Diagnosis present

## 2017-04-25 DIAGNOSIS — Z955 Presence of coronary angioplasty implant and graft: Secondary | ICD-10-CM

## 2017-04-25 DIAGNOSIS — I340 Nonrheumatic mitral (valve) insufficiency: Secondary | ICD-10-CM | POA: Diagnosis present

## 2017-04-25 DIAGNOSIS — I951 Orthostatic hypotension: Secondary | ICD-10-CM | POA: Diagnosis present

## 2017-04-25 DIAGNOSIS — K5900 Constipation, unspecified: Secondary | ICD-10-CM | POA: Diagnosis present

## 2017-04-25 DIAGNOSIS — I428 Other cardiomyopathies: Secondary | ICD-10-CM | POA: Diagnosis present

## 2017-04-25 DIAGNOSIS — I4891 Unspecified atrial fibrillation: Secondary | ICD-10-CM | POA: Diagnosis present

## 2017-04-25 DIAGNOSIS — Z96642 Presence of left artificial hip joint: Secondary | ICD-10-CM | POA: Diagnosis present

## 2017-04-25 DIAGNOSIS — E876 Hypokalemia: Secondary | ICD-10-CM | POA: Diagnosis present

## 2017-04-25 DIAGNOSIS — Z6822 Body mass index (BMI) 22.0-22.9, adult: Secondary | ICD-10-CM

## 2017-04-25 DIAGNOSIS — N184 Chronic kidney disease, stage 4 (severe): Secondary | ICD-10-CM | POA: Diagnosis present

## 2017-04-25 DIAGNOSIS — I2510 Atherosclerotic heart disease of native coronary artery without angina pectoris: Secondary | ICD-10-CM | POA: Diagnosis present

## 2017-04-25 DIAGNOSIS — Z79899 Other long term (current) drug therapy: Secondary | ICD-10-CM

## 2017-04-25 DIAGNOSIS — I5043 Acute on chronic combined systolic (congestive) and diastolic (congestive) heart failure: Secondary | ICD-10-CM | POA: Diagnosis present

## 2017-04-25 DIAGNOSIS — R627 Adult failure to thrive: Secondary | ICD-10-CM | POA: Diagnosis present

## 2017-04-25 DIAGNOSIS — E871 Hypo-osmolality and hyponatremia: Secondary | ICD-10-CM | POA: Diagnosis present

## 2017-04-25 DIAGNOSIS — N059 Unspecified nephritic syndrome with unspecified morphologic changes: Secondary | ICD-10-CM | POA: Diagnosis present

## 2017-04-25 DIAGNOSIS — Z7901 Long term (current) use of anticoagulants: Secondary | ICD-10-CM

## 2017-04-25 DIAGNOSIS — Z9119 Patient's noncompliance with other medical treatment and regimen: Secondary | ICD-10-CM

## 2017-04-25 DIAGNOSIS — T502X5A Adverse effect of carbonic-anhydrase inhibitors, benzothiadiazides and other diuretics, initial encounter: Secondary | ICD-10-CM | POA: Diagnosis present

## 2017-04-25 DIAGNOSIS — Z853 Personal history of malignant neoplasm of breast: Secondary | ICD-10-CM

## 2017-04-25 DIAGNOSIS — Z66 Do not resuscitate: Secondary | ICD-10-CM | POA: Diagnosis present

## 2017-04-25 DIAGNOSIS — Z90710 Acquired absence of both cervix and uterus: Secondary | ICD-10-CM

## 2017-04-25 DIAGNOSIS — Z87891 Personal history of nicotine dependence: Secondary | ICD-10-CM

## 2017-04-25 DIAGNOSIS — K219 Gastro-esophageal reflux disease without esophagitis: Secondary | ICD-10-CM | POA: Diagnosis present

## 2017-04-25 DIAGNOSIS — I252 Old myocardial infarction: Secondary | ICD-10-CM

## 2017-04-25 DIAGNOSIS — Z638 Other specified problems related to primary support group: Secondary | ICD-10-CM

## 2017-04-25 DIAGNOSIS — I130 Hypertensive heart and chronic kidney disease with heart failure and stage 1 through stage 4 chronic kidney disease, or unspecified chronic kidney disease: Principal | ICD-10-CM | POA: Diagnosis present

## 2017-04-25 DIAGNOSIS — Z7982 Long term (current) use of aspirin: Secondary | ICD-10-CM

## 2017-04-25 DIAGNOSIS — I255 Ischemic cardiomyopathy: Secondary | ICD-10-CM | POA: Diagnosis present

## 2017-04-25 DIAGNOSIS — Z8249 Family history of ischemic heart disease and other diseases of the circulatory system: Secondary | ICD-10-CM

## 2017-04-25 DIAGNOSIS — I5084 End stage heart failure: Secondary | ICD-10-CM | POA: Diagnosis present

## 2017-04-25 DIAGNOSIS — R339 Retention of urine, unspecified: Secondary | ICD-10-CM | POA: Diagnosis present

## 2017-04-26 ENCOUNTER — Encounter (HOSPITAL_COMMUNITY): Payer: Self-pay

## 2017-04-26 ENCOUNTER — Emergency Department (HOSPITAL_COMMUNITY): Payer: Medicare Other

## 2017-04-26 ENCOUNTER — Inpatient Hospital Stay (HOSPITAL_COMMUNITY)
Admission: EM | Admit: 2017-04-26 | Discharge: 2017-05-01 | DRG: 291 | Disposition: A | Discharge status: Hospice | Payer: Medicare Other | Attending: Internal Medicine | Admitting: Internal Medicine

## 2017-04-26 ENCOUNTER — Observation Stay (HOSPITAL_COMMUNITY): Payer: Medicare Other

## 2017-04-26 DIAGNOSIS — Z515 Encounter for palliative care: Secondary | ICD-10-CM

## 2017-04-26 DIAGNOSIS — N189 Chronic kidney disease, unspecified: Secondary | ICD-10-CM

## 2017-04-26 DIAGNOSIS — I2510 Atherosclerotic heart disease of native coronary artery without angina pectoris: Secondary | ICD-10-CM

## 2017-04-26 DIAGNOSIS — I5023 Acute on chronic systolic (congestive) heart failure: Secondary | ICD-10-CM | POA: Diagnosis present

## 2017-04-26 DIAGNOSIS — Z7189 Other specified counseling: Secondary | ICD-10-CM

## 2017-04-26 DIAGNOSIS — I519 Heart disease, unspecified: Secondary | ICD-10-CM

## 2017-04-26 DIAGNOSIS — E440 Moderate protein-calorie malnutrition: Secondary | ICD-10-CM | POA: Diagnosis not present

## 2017-04-26 DIAGNOSIS — E871 Hypo-osmolality and hyponatremia: Secondary | ICD-10-CM | POA: Diagnosis not present

## 2017-04-26 DIAGNOSIS — I255 Ischemic cardiomyopathy: Secondary | ICD-10-CM | POA: Diagnosis not present

## 2017-04-26 DIAGNOSIS — E8770 Fluid overload, unspecified: Secondary | ICD-10-CM | POA: Diagnosis present

## 2017-04-26 DIAGNOSIS — I340 Nonrheumatic mitral (valve) insufficiency: Secondary | ICD-10-CM | POA: Diagnosis present

## 2017-04-26 DIAGNOSIS — F3175 Bipolar disorder, in partial remission, most recent episode depressed: Secondary | ICD-10-CM | POA: Diagnosis present

## 2017-04-26 LAB — BASIC METABOLIC PANEL
ANION GAP: 12 (ref 5–15)
Anion gap: 8 (ref 5–15)
BUN: 20 mg/dL (ref 6–20)
BUN: 21 mg/dL — ABNORMAL HIGH (ref 6–20)
CALCIUM: 9.3 mg/dL (ref 8.9–10.3)
CHLORIDE: 89 mmol/L — AB (ref 101–111)
CO2: 24 mmol/L (ref 22–32)
CO2: 30 mmol/L (ref 22–32)
CREATININE: 1.76 mg/dL — AB (ref 0.44–1.00)
Calcium: 9.1 mg/dL (ref 8.9–10.3)
Chloride: 86 mmol/L — ABNORMAL LOW (ref 101–111)
Creatinine, Ser: 1.84 mg/dL — ABNORMAL HIGH (ref 0.44–1.00)
GFR calc Af Amer: 34 mL/min — ABNORMAL LOW (ref >60)
GFR calc non Af Amer: 28 mL/min — ABNORMAL LOW (ref >60)
GFR calc non Af Amer: 30 mL/min — ABNORMAL LOW (ref >60)
GFR, EST AFRICAN AMERICAN: 33 mL/min — AB (ref >60)
GLUCOSE: 121 mg/dL — AB (ref 65–99)
Glucose, Bld: 104 mg/dL — ABNORMAL HIGH (ref 65–99)
POTASSIUM: 4.4 mmol/L (ref 3.5–5.1)
Potassium: 3.6 mmol/L (ref 3.5–5.1)
Sodium: 122 mmol/L — ABNORMAL LOW (ref 135–145)
Sodium: 127 mmol/L — ABNORMAL LOW (ref 135–145)

## 2017-04-26 LAB — CBC
HEMATOCRIT: 33.2 % — AB (ref 36.0–46.0)
HEMOGLOBIN: 11 g/dL — AB (ref 12.0–15.0)
MCH: 27 pg (ref 26.0–34.0)
MCHC: 33.1 g/dL (ref 30.0–36.0)
MCV: 81.6 fL (ref 78.0–100.0)
Platelets: 279 10*3/uL (ref 150–400)
RBC: 4.07 MIL/uL (ref 3.87–5.11)
RDW: 16.5 % — ABNORMAL HIGH (ref 11.5–15.5)
WBC: 6.8 10*3/uL (ref 4.0–10.5)

## 2017-04-26 LAB — URINALYSIS, ROUTINE W REFLEX MICROSCOPIC
Bilirubin Urine: NEGATIVE
GLUCOSE, UA: NEGATIVE mg/dL
Hgb urine dipstick: NEGATIVE
Ketones, ur: NEGATIVE mg/dL
LEUKOCYTES UA: NEGATIVE
Nitrite: NEGATIVE
PH: 7 (ref 5.0–8.0)
Protein, ur: NEGATIVE mg/dL
Specific Gravity, Urine: 1.002 — ABNORMAL LOW (ref 1.005–1.030)

## 2017-04-26 LAB — BRAIN NATRIURETIC PEPTIDE: B Natriuretic Peptide: 2018.4 pg/mL — ABNORMAL HIGH (ref 0.0–100.0)

## 2017-04-26 LAB — CREATININE, URINE, RANDOM: Creatinine, Urine: 20.96 mg/dL

## 2017-04-26 LAB — I-STAT TROPONIN, ED: Troponin i, poc: 0.04 ng/mL (ref 0.00–0.08)

## 2017-04-26 LAB — OSMOLALITY, URINE: Osmolality, Ur: 111 mOsm/kg — ABNORMAL LOW (ref 300–900)

## 2017-04-26 LAB — OSMOLALITY: Osmolality: 272 mOsm/kg — ABNORMAL LOW (ref 275–295)

## 2017-04-26 LAB — SODIUM, URINE, RANDOM: Sodium, Ur: <10 mmol/L

## 2017-04-26 MED ORDER — ENOXAPARIN SODIUM 30 MG/0.3ML ~~LOC~~ SOLN
30.0000 mg | SUBCUTANEOUS | Status: DC
  Administered 2017-04-26 – 2017-05-01 (×6): 30 mg via SUBCUTANEOUS
  Filled 2017-04-26 (×6): qty 0.3

## 2017-04-26 MED ORDER — CLOPIDOGREL BISULFATE 75 MG PO TABS
75.0000 mg | ORAL_TABLET | Freq: Every day | ORAL | Status: DC
  Administered 2017-04-26 – 2017-05-01 (×6): 75 mg via ORAL
  Filled 2017-04-26 (×6): qty 1

## 2017-04-26 MED ORDER — FUROSEMIDE 10 MG/ML IJ SOLN
80.0000 mg | Freq: Once | INTRAMUSCULAR | Status: AC
  Administered 2017-04-26: 80 mg via INTRAVENOUS
  Filled 2017-04-26: qty 8

## 2017-04-26 MED ORDER — FUROSEMIDE 80 MG PO TABS
80.0000 mg | ORAL_TABLET | Freq: Two times a day (BID) | ORAL | Status: DC
  Administered 2017-04-26: 80 mg via ORAL
  Filled 2017-04-26: qty 1

## 2017-04-26 MED ORDER — CLONAZEPAM 0.5 MG PO TABS
1.0000 mg | ORAL_TABLET | Freq: Three times a day (TID) | ORAL | Status: DC
  Administered 2017-04-26 – 2017-05-01 (×16): 1 mg via ORAL
  Filled 2017-04-26 (×17): qty 2

## 2017-04-26 MED ORDER — ASPIRIN EC 81 MG PO TBEC
81.0000 mg | DELAYED_RELEASE_TABLET | Freq: Every day | ORAL | Status: DC
  Administered 2017-04-26 – 2017-05-01 (×6): 81 mg via ORAL
  Filled 2017-04-26 (×6): qty 1

## 2017-04-26 MED ORDER — QUETIAPINE FUMARATE 400 MG PO TABS
400.0000 mg | ORAL_TABLET | Freq: Every day | ORAL | Status: DC
  Administered 2017-04-26 – 2017-04-30 (×5): 400 mg via ORAL
  Filled 2017-04-26 (×6): qty 1

## 2017-04-26 MED ORDER — NITROGLYCERIN 0.4 MG SL SUBL: 0.4000 mg | SUBLINGUAL_TABLET | SUBLINGUAL | Status: DC | PRN

## 2017-04-26 MED ORDER — FERROUS SULFATE 325 (65 FE) MG PO TABS
325.0000 mg | ORAL_TABLET | Freq: Every day | ORAL | Status: DC
  Administered 2017-04-26 – 2017-05-01 (×6): 325 mg via ORAL
  Filled 2017-04-26 (×8): qty 1

## 2017-04-26 MED ORDER — POTASSIUM CHLORIDE CRYS ER 20 MEQ PO TBCR
20.0000 meq | EXTENDED_RELEASE_TABLET | Freq: Two times a day (BID) | ORAL | Status: DC
  Administered 2017-04-26 – 2017-04-29 (×7): 20 meq via ORAL
  Filled 2017-04-26 (×8): qty 1

## 2017-04-26 MED ORDER — RISPERIDONE 1 MG PO TABS
2.0000 mg | ORAL_TABLET | Freq: Three times a day (TID) | ORAL | Status: DC
  Administered 2017-04-26 – 2017-05-01 (×16): 2 mg via ORAL
  Filled 2017-04-26 (×16): qty 2

## 2017-04-26 MED ORDER — FUROSEMIDE 40 MG PO TABS: 40.0000 mg | ORAL_TABLET | Freq: Two times a day (BID) | ORAL | Status: DC

## 2017-04-26 MED ORDER — QUETIAPINE FUMARATE 300 MG PO TABS
300.0000 mg | ORAL_TABLET | Freq: Two times a day (BID) | ORAL | Status: DC
  Administered 2017-04-26 – 2017-05-01 (×11): 300 mg via ORAL
  Filled 2017-04-26 (×12): qty 1

## 2017-04-26 NOTE — ED Provider Notes (Signed)
[facility] DEPT Provider Note   CSN: [id number] Arrival date & time: 04/25/17  02-06-2356  By signing my name below, I, Ny'Kea Lewis, attest that this documentation has been prepared under the direction and in the presence of Sherwood Gambler, MD. Electronically Signed: Lise Auer, ED Scribe. 04/26/17. 1:19 AM.  History   Chief Complaint Chief Complaint  Patient presents with  . Shortness of Breath   The history is provided by the patient. No language interpreter was used.    HPI Comments: Caroline [name] is a 66 y.o. female with a PMHx of CHF, kidney disease, heart disease who presents to the Emergency Department complaining of gradually worsening, acute on chronic shortness of breath that worsened today. Pt notes associated fluid build up in her abdomen and unable to urinate. Pt reports today she began to have shortness of breath. She notes this is similar to her CHF hx.  On 6/22 her Lasix dosage was "bumped up to 120 mg". Pt states she has not been able to urinate within the last couple of hours which is a new onset for her. Denies abdominal pain, peripheral edema, cough, or chest pain.   Past Medical History:  Diagnosis Date  . Angina   . Anxiety   . Arthritis    "severe in my back" (07/02/2016)  . Atrial fibrillation ([place])   . Bipolar disorder ([place])   . Bright disease   . CAD (coronary artery disease)    [place]  Diagonal instent restenosis treated 10/2005  . Cancer of right breast ([place]) 02/05/01  . Carotid artery occlusion   . CHF (congestive heart failure) ([place])    states she was told se had this in past by MD  . Chronic back pain   . Depression    02/05/02 had shock treatment due to depresson, spouse passed away 02/06/2000  . Dyslipidemia   . GERD (gastroesophageal reflux disease)   . Graves' disease    /notes 06/03/2010  . Heart murmur    states years ago  . Hypertension    states a long time ago-states she never took medication  . Non Q wave myocardial infarction ([place])  03/2004   Archie Endo 06/03/2010  . Shortness of breath    with exertion   Patient Active Problem List   Diagnosis Date Noted  . Pressure injury of skin 04/12/2017  . Severe mitral regurgitation 04/08/2017  . Malnutrition of moderate degree 08/11/2016  . Bipolar 1 disorder, depressed, partial remission ([place]) 08/10/2016  . Syncope 08/09/2016  . Acute kidney injury superimposed on chronic kidney disease ([place]) 08/09/2016  . Coronary artery disease involving native coronary artery   . CAD S/P percutaneous coronary angioplasty   . Acute on chronic systolic congestive heart failure ([place]) 07/02/2016  . 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 Chronic renal disease, stage 3, moderately decreased glomerular filtration rate (GFR) between 30-59 mL/min/1.73 square meter 06/19/2015  . Adrenal insufficiency ([place])   . Hyponatremia 06/12/2015  . Orthostasis 06/11/2015  . Pubic ramus fracture ([place]) 06/09/2015  . Fracture of multiple pubic rami ([place]) 06/09/2015  . Chronic diarrhea 03/21/2015  . Cardiomyopathy, ischemic 01/23/2015  . 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 Chronic low back pain 05/21/2014  . Dizziness and giddiness 05/03/2014  . Difficulty speaking 05/03/2014  . Numbness-Bilateral arm / Leg 05/03/2014  . Aftercare following surgery of the circulatory system, [place] 05/03/2014  . Carotid artery occlusion with infarction ([place]) 04/26/2013  . Weight loss 04/23/2012  . Hot flashes 04/23/2012  . Anemia  04/23/2012  . Breast cancer ([place]) 04/23/2012  . Occlusion and stenosis of carotid artery without mention of cerebral infarction 01/06/2012  . Bruit 12/09/2011  . SMOKER 05/24/2009  . ANXIETY DEPRESSION 02/28/2009  . GRAVES DISEASE 02/17/2009  . Hyperlipidemia 02/17/2009  . Coronary atherosclerosis 02/17/2009  . GERD 02/17/2009   Past Surgical History:  Procedure Laterality Date  . BREAST LUMPECTOMY Right 2002  . CARDIAC CATHETERIZATION N/A 07/03/2016   Procedure: Right/Left Heart Cath and  Coronary Angiography;  Surgeon: Leonie Man, MD;  Location: [facility];  Service: Cardiovascular;  Laterality: N/A;  . CARDIAC CATHETERIZATION N/A 07/22/2016   Procedure: Coronary Stent Intervention;  Surgeon: Leonie Man, MD;  Location: [facility];  Service: Cardiovascular;  Laterality: N/A;  . CAROTID ENDARTERECTOMY  01/08/12   Left  . CORONARY ANGIOPLASTY  03/2009   Archie Endo 06/03/2010  . CORONARY ANGIOPLASTY WITH STENT PLACEMENT  03/2004   Archie Endo 06/03/2010;  . CORONARY ANGIOPLASTY WITH STENT PLACEMENT      "I've got 3 stent in there" (07/02/2016)  . CORONARY STENT PLACEMENT  07/23/2016   STENT PROMUS PREM MR 2.5X12 drug eluting stent was successfully placed, and overlaps previously placed stent.  [name] ENDARTERECTOMY  01/08/2012   Procedure: ENDARTERECTOMY CAROTID;  Surgeon: Mal Misty, MD;  Location: [facility];  Service: Vascular;  Laterality: Left;  with Dacron patch angioplasty  . FRACTURE SURGERY  2007   Broken Back  . JOINT REPLACEMENT    . MULTIPLE TOOTH EXTRACTIONS  2016   "18 or 19"  . TEE WITHOUT CARDIOVERSION N/A 07/04/2016   Procedure: TRANSESOPHAGEAL ECHOCARDIOGRAM (TEE);  Surgeon: Larey Dresser, MD;  Location: [facility];  Service: Cardiovascular;  Laterality: N/A;  . TOTAL HIP ARTHROPLASTY Left 2011  . VAGINAL HYSTERECTOMY  1970s   partial   OB History    No data available     Home Medications    Prior to Admission medications   Medication Sig Start Date End Date Taking? Authorizing Provider  aspirin 81 MG tablet Take 1 tablet (81 mg total) by mouth daily. 07/05/16   Barrett, Evelene Croon, PA-C  clonazePAM (KLONOPIN) 1 MG tablet Take 1 tablet (1 mg total) by mouth 3 (three) times daily. 04/13/17   Zada Finders, MD  clopidogrel (PLAVIX) 75 MG tablet Take ONE (1) tablet by mouth once daily 02/19/17   Josue Hector, MD  ferrous sulfate 325 (65 FE) MG tablet Take 325 mg by mouth daily with breakfast.    [provider]  furosemide (LASIX) 40 MG  tablet Take 2 tablets (80 mg total) by mouth 2 (two) times daily. 04/24/17   Eileen Stanford, PA-C  nitroGLYCERIN (NITROSTAT) 0.4 MG SL tablet Place 1 tablet (0.4 mg total) under the tongue every 5 (five) minutes as needed for chest pain. 07/05/16   Barrett, Evelene Croon, PA-C  potassium chloride SA (K-DUR,KLOR-CON) 20 MEQ tablet Take 1 tablet (20 mEq total) by mouth 2 (two) times daily. 04/24/17   Eileen Stanford, PA-C  QUEtiapine (SEROQUEL) 300 MG tablet Take 300 mg by mouth 2 (two) times daily.    [provider]  QUEtiapine (SEROQUEL) 400 MG tablet Take 400 mg by mouth at bedtime.    [provider]  risperiDONE (RISPERDAL) 2 MG tablet Take 2 mg by mouth 3 (three) times daily.    [provider]    Family History Family History  Problem Relation Age of Onset  . Coronary artery disease Father   .  Prostate cancer Father        not sure  . Pancreatic cancer Father        not sure  . Cancer Father        Pancreatic and  Prostate  . Heart disease Father        Before age 58 and BPG  . Hyperlipidemia Father   . Heart attack Father   . Hypertension Mother   . Diabetes Sister   . Colon cancer Neg Hx    Social History Social History  Substance Use Topics  . Smoking status: Former Smoker    Packs/day: 0.75    Years: 49.00    Types: Cigarettes    Quit date: 08/04/2015  . Smokeless tobacco: Never Used  . Alcohol use No   Allergies   Patient has no known allergies.  Review of Systems Review of Systems  Respiratory: Positive for shortness of breath. Negative for cough.   Cardiovascular: Negative for chest pain and leg swelling.  Gastrointestinal: Negative for abdominal pain.  Genitourinary: Positive for decreased urine volume.  All other systems reviewed and are negative.  Physical Exam Updated Vital Signs BP 97/67   Pulse 95   Temp 97.6 F (36.4 C) (Oral)   Resp (!) 22   SpO2 100%   Physical Exam  Constitutional: She is oriented to person,  place, and time. She appears well-developed and well-nourished.  HENT:  Head: Normocephalic and atraumatic.  Right Ear: External ear normal.  Left Ear: External ear normal.  Nose: Nose normal.  Eyes: Right eye exhibits no discharge. Left eye exhibits no discharge.  Cardiovascular: Normal rate, regular rhythm and normal heart sounds.   Pulmonary/Chest: Effort normal. She has rales in the right lower field and the left lower field.  Abdominal: Soft. She exhibits no distension. There is no tenderness.  Musculoskeletal: She exhibits no edema.  Neurological: She is alert and oriented to person, place, and time.  Skin: Skin is warm and dry.  Nursing note and vitals reviewed.   ED Treatments / Results  DIAGNOSTIC STUDIES: Oxygen Saturation is 100% on RA, normal by my interpretation.   COORDINATION OF CARE: 12:57 AM-Discussed next steps with pt. Pt verbalized understanding and is agreeable with the plan.   Labs (all labs ordered are listed, but only abnormal results are displayed) Labs Reviewed  BASIC METABOLIC PANEL - Abnormal; Notable for the following:       Result Value   Sodium 122 (*)    Chloride 86 (*)    Glucose, Bld 121 (*)    BUN 21 (*)    Creatinine, Ser 1.84 (*)    GFR calc non Af Amer 28 (*)    GFR calc Af Amer 33 (*)    All other components within normal limits  CBC - Abnormal; Notable for the following:    Hemoglobin 11.0 (*)    HCT 33.2 (*)    RDW 16.5 (*)    All other components within normal limits  I-STAT TROPOININ, ED    EKG  EKG Interpretation  Date/Time:  Sunday April 26 2017 00:05:56 EDT Ventricular Rate:  93 PR Interval:  214 QRS Duration: 98 QT Interval:  364 QTC Calculation: 452 R Axis:   -32 Text Interpretation:  Sinus rhythm with 1st degree A-V block Possible Left atrial enlargement Left axis deviation Anterior infarct , age undetermined Abnormal ECG changes noted compared to April 08 2017 Confirmed by Sherwood Gambler [id number]) on 04/26/2017  12:51:37 AM  Radiology Dg Chest 2 View  Result Date: 04/26/2017 CLINICAL DATA:  Shortness of breath.  History CHF. EXAM: CHEST  2 VIEW COMPARISON:  Chest radiograph April 08, 2017 FINDINGS: The cardiac silhouette is similarly enlarged. Mildly calcified aortic knob. Mild pulmonary vascular congestion and diffuse mild interstitial prominence. Blunting of LEFT costophrenic angle. Cardiac stent. No pneumothorax. Surgical clips project RIGHT breast. Soft tissue planes and included osseous structures are nonsuspicious. IMPRESSION: Stable cardiomegaly and mild chronic interstitial changes. Trace LEFT pleural effusion versus pleural thickening. Electronically Signed   By: Elon Alas M.D.   On: 04/26/2017 01:38    Procedures Procedures (including critical care time)  Medications Ordered in ED Medications - No data to display  Initial Impression / Assessment and Plan / ED Course  I have reviewed the triage vital signs and the nursing notes.  Pertinent labs & imaging results that were available during my care of the patient were reviewed by me and considered in my medical decision making (see chart for details).     Patient's blood work reveals continued elevation of BNP consistent with CHF. Have her her lungs on chest x-ray are mostly clear. No significant distention seen on my exam. She also has significant hyponatremia, this could be from CHF versus excessive Lasix. At this point she is overall stable but we'll need admission for further treatment and care. Internal medicine teaching service to admit.  Final Clinical Impressions(s) / ED Diagnoses   Final diagnoses:  Hyponatremia    New Prescriptions New Prescriptions   No medications on file   I personally performed the services described in this documentation, which was scribed in my presence. The recorded information has been reviewed and is accurate.     Sherwood Gambler, MD 04/26/17 [phone]

## 2017-04-26 NOTE — ED Notes (Signed)
Attempted to call report however floor was not aware pt was coming.  Per floor RN will call this writer back once they look at the pt's chart.

## 2017-04-26 NOTE — Progress Notes (Signed)
Pt's is stable, no any complain of pain of pain, BP running softer side, MD talked with patient's daughter over phone and keep updated, renal USG done, pt is having good output so far, urine analysis sent, will continue to monitor

## 2017-04-26 NOTE — Progress Notes (Signed)
   Subjective:  Patient reports continuing to feel short of breath. She says she has been taking her Lasix 80 mg BID since this was adjusted 3 days ago. She says she felt well after she was discharged from the hospital on 6/11 up until yesterday. She has occasional feelings of palpitations. She says she is making less urine than usual and has occasional urgency without ability to void. She denies dysuria or incontinence. She denies any chest pain, fevers, chills, cough, or peripheral edema.  Objective:  Vital signs in last 24 hours: Vitals:   04/26/17 0529 04/26/17 0534 04/26/17 0835 04/26/17 1209  BP:  107/61 107/63 105/60  Pulse:  91 86 90  Resp:  18    Temp:  98 F (36.7 C) 97.7 F (36.5 C) 97.7 F (36.5 C)  TempSrc:  Oral Oral Oral  SpO2:  97% 97% 100%  Weight: 124 lb 9.6 oz (56.5 kg)     Height: 5\' 3"  (1.6 m)      General: resting in bed, no acute distress Cardiac: RRR, 3/6 holosystolic murmur loudest at the apex, JVD to ~ 1 cm below angle of mandible Pulm: bibasilar crackles, no respiratory distress, no wheezing or rhonci Abd: soft, nontender, nondistended, BS present Ext: no pedal edema Neuro: flat affect, soft spoken   Assessment/Plan: Caroline [name] a 66 y.o.femalewith a PMHx of CHF(EF 30-35%), kidney disease, heart disease who presented to the ED complaining of gradually worsening, acute on chronic shortness of breath.  #Chronic HFrEF with Severe MR: Recently admitted for acute on chronic HFrEF exacerbation. Discharge weight was 123 lbs. TTE 04/08/2017 showed an EF of 30-35% with severe MR (not a surgical candidate). Adherence to Lasix has been an issue and her home Lasix dose was increased to 80 mg BID from 40 mg BID recently, to which she reports adherence. She has bibasilar crackles on exam without obvious peripheral edema or pulmonary congestion on CXR. Weight is near her baseline at 124 lbs. We will resume her home oral Lasix today. - Lasix 80 mg po BID -  Supplement K - Daily weights - Strict I/Os - Monitor BMET - PT eval  #Hyponatremia:  Na 122 on admission, now 127. On her recent admission it was initially thought that her hyponatremia was due to her HF exacerbation, but it did not improve with diuresis. She likely has a chronic hyponatremia from her CHF, however her acute drop (Na 131 three days prior to admission) may be from diuretic use. Hypothyroidism could be playing a role as TSH on 6/6 mildly elevated to 4.85. Would also consider medication effect from her antipsychotic meds. - Check serum Osm, urine Osm, urine Na, urine creatinine, urine urea - Monitor BMP - Repeat TSH outpatient  #Hypotension: Patient has chronic postural hypotension, previously prescribed midodrine.  - Monitor blood pressure.  #CKD Stage 3-4 ## Urinary Retention Near her baseline creatinine of 1.7. She reports decreased UOP with urgency but difficulty voiding. - Obtain UA, post-void residual, renal/bladder U/S  CAD s/p PCI/DES 07/2016: - Continue home Aspirin, statin and Plavix  Dispo: Anticipated discharge in approximately 1-3 day(s).   Zada Finders, MD 04/26/2017, 12:50 PM

## 2017-04-26 NOTE — ED Triage Notes (Signed)
Pt complaining of SOB. Pt states hx of CHF, states feels same. Pt states took extra lasix today, no relief. Pt denies any swelling in legs.

## 2017-04-26 NOTE — Progress Notes (Signed)
New Admission Note:   Arrival Method: ED bed with ED nurse tech, ambulated to unit bed. Mental Orientation: A&O x4, speech is incoherent at times. Telemetry: Initiated, verified Skin: 2nd nurse- Kennieth Francois, RN Pain: denies Tubes: nasal cannula, placed on 1L for comfort Safety Measures: yellow socks, bed alarm.   Orders to be reviewed and implemented. Will continue to monitor the patient. Call light has been placed within reach and bed alarm has been activated.   Riki Altes, RN Phone: [phone]

## 2017-04-26 NOTE — H&P (Signed)
Date: 04/26/2017               Patient Name:  Caroline [name] MRN: [id number]  DOB: [birth year] Age / Sex: 66 y.o., female   PCP: Patient, No Pcp Per         Medical Service: Internal Medicine Teaching Service         Attending Physician: Dr. Sid Falcon, MD    First Contact: Dr. Danford Bad Pager: [phone]  Second Contact: Dr. Posey Pronto Pager: [phone]       After Hours (After 5p/  First Contact Pager: [phone]  weekends / holidays): Second Contact Pager: [phone]   Chief Complaint: Shortness of breath  History of Present Illness: Caroline [name] is a 66 y.o. female with a PMHx of CHF(EF 30-35%), kidney disease, heart disease who presents to the Emergency Department complaining of gradually worsening, acute on chronic shortness of breath that worsened today. Pt notes associated fluid build up in her abdomen and unable to urinate well for one day.   Patient was recently admitted because of CHF exacerbation and was discharged on 04/13/2017.she visited her heart failure clinic on 04/23/2017, they increased her Lasix.  There were some compliance issues during that visit, she was just taking Lasix when necessary, according to patient since her visit to heart failure clinic she is using 80 mg twice a day. She was also complaining that she is unable to urinate well, she does not ampty her bladder and feels some abdominal distention.she denies any dysuria or hematuria. She denies any chest pain, nausea or vomiting, abdominal pain. She do endores occasional palpitations, and decreased appetite since she was discharged from hospital. She was also complaining of intermittent constipation, her last bowel movement was yesterday. She denies any fever or chills. Her last recorded weight during CHF clinic visit on 04/23/2017 was 123 pounds. Today her weight is 124 pounds.  In ED. She was hemodynamically stable, saturating well on room air but insisted on keeping the nasal cannula on. Her sodium was 122 , BUN  21 and creatinine 1.84 with hemoglobin of 11.she was admitted for CHF exacerbation.  Meds:  Current Meds  Medication Sig  . aspirin 81 MG tablet Take 1 tablet (81 mg total) by mouth daily.  . clonazePAM (KLONOPIN) 1 MG tablet Take 1 tablet (1 mg total) by mouth 3 (three) times daily.  . clopidogrel (PLAVIX) 75 MG tablet Take ONE (1) tablet by mouth once daily  . ferrous sulfate 325 (65 FE) MG tablet Take 325 mg by mouth daily with breakfast.  . furosemide (LASIX) 40 MG tablet Take 2 tablets (80 mg total) by mouth 2 (two) times daily.  . nitroGLYCERIN (NITROSTAT) 0.4 MG SL tablet Place 1 tablet (0.4 mg total) under the tongue every 5 (five) minutes as needed for chest pain.  . potassium chloride SA (K-DUR,KLOR-CON) 20 MEQ tablet Take 1 tablet (20 mEq total) by mouth 2 (two) times daily.  . QUEtiapine (SEROQUEL) 300 MG tablet Take 300 mg by mouth 2 (two) times daily.  . QUEtiapine (SEROQUEL) 400 MG tablet Take 400 mg by mouth at bedtime.  . risperiDONE (RISPERDAL) 2 MG tablet Take 2 mg by mouth 3 (three) times daily.     Allergies: Allergies as of 04/25/2017  . (No Known Allergies)   Past Medical History:  Diagnosis Date  . Angina   . Anxiety   . Arthritis    "severe in my back" (07/02/2016)  . Atrial fibrillation ([place])   .  Bipolar disorder ([place])   . Bright disease   . CAD (coronary artery disease)    [facility]  Diagonal instent restenosis treated 10/2005  . Cancer of right breast ([place]) 02-23-2001  . Carotid artery occlusion   . CHF (congestive heart failure) ([place])    states she was told se had this in past by MD  . Chronic back pain   . Depression    2002/02/23 had shock treatment due to depresson, spouse passed away 02-24-00  . Dyslipidemia   . GERD (gastroesophageal reflux disease)   . Graves' disease    /notes 06/03/2010  . Heart murmur    states years ago  . Hypertension    states a long time ago-states she never took medication  . Non Q wave myocardial infarction ([place]) 03/2004    Archie Endo 06/03/2010  . Shortness of breath    with exertion    Family History: Father had hypertension.  Social History: Patient lives alone and independent with all her ADLs. Former smoker, quit 2 years ago. Denies any alcohol or illicit drug use.  Review of Systems: A complete ROS was negative except as per HPI.   Physical Exam: Blood pressure 107/61, pulse 91, temperature 98 F (36.7 C), temperature source Oral, resp. rate 18, height 5\' 3"  (1.6 m), weight 124 lb 9.6 oz (56.5 kg), SpO2 97 %. Vitals:   04/26/17 0445 04/26/17 0500 04/26/17 0529 04/26/17 0534  BP: (!) 101/58 94/60  107/61  Pulse: 88 88  91  Resp: 20 13  18   Temp:    98 F (36.7 C)  TempSrc:    Oral  SpO2: 100% 98%  97%  Weight:   124 lb 9.6 oz (56.5 kg)   Height:   5\' 3"  (1.6 m)    General: Vital signs reviewed.  Patient is well-developed and well-nourished, in no acute distress and cooperative with exam.  Head: Normocephalic and atraumatic. Eyes: EOMI, conjunctivae normal, no scleral icterus.  Neck: Supple, trachea midline, normal ROM, no JVD, masses, thyromegaly, or carotid bruit present.  Cardiovascular: RRR, S1 normal, S2 normal, no murmurs, gallops, or rubs. Pulmonary/Chest: Few basal crackles bilaterally. Abdominal: Soft, non-tender, non-distended, BS +, no masses, organomegaly, or guarding present.  Extremities: No lower extremity edema bilaterally,  pulses symmetric and intact bilaterally. No cyanosis or clubbing. Neurological: A&O x3, Strength is normal and symmetric bilaterally, cranial nerve II-XII are grossly intact, no focal motor deficit, sensory intact to light touch bilaterally.  Skin: Warm, dry and intact. No rashes or erythema. Psychiatric: Flat affect and speaking at a very low tone.  Labs. CBC Latest Ref Rng & Units 04/26/2017 04/09/2017 04/08/2017  WBC 4.0 - 10.5 K/uL 6.8 5.9 4.8  Hemoglobin 12.0 - 15.0 g/dL 11.0(L) 9.6(L) 9.7(L)  Hematocrit 36.0 - 46.0 % 33.2(L) 29.5(L) 29.5(L)  Platelets 150  - 400 K/uL 279 258 235   BMP Latest Ref Rng & Units 04/26/2017 04/23/2017 04/13/2017  Glucose 65 - 99 mg/dL 121(H) 100(H) 114(H)  BUN 6 - 20 mg/dL 21(H) 27 26(H)  Creatinine 0.44 - 1.00 mg/dL 1.84(H) 1.61(H) 1.90(H)  BUN/Creat Ratio 12 - 28 - 17 -  Sodium 135 - 145 mmol/L 122(L) 131(L) 130(L)  Potassium 3.5 - 5.1 mmol/L 4.4 3.7 3.5  Chloride 101 - 111 mmol/L 86(L) 89(L) 92(L)  CO2 22 - 32 mmol/L 24 25 28   Calcium 8.9 - 10.3 mg/dL 9.1 9.8 9.0   Troponin.0.04 BNP. 2,018.4  EKG: Sinus rhythm with first-degree AV block, PR interval 214,  and left axis deviation. No acute changes compared to previous EKG.  CXR: FINDINGS: The cardiac silhouette is similarly enlarged. Mildly calcified aortic knob. Mild pulmonary vascular congestion and diffuse mild interstitial prominence. Blunting of LEFT costophrenic angle. Cardiac stent. No pneumothorax. Surgical clips project RIGHT breast. Soft tissue planes and included osseous structures are nonsuspicious.  IMPRESSION: Stable cardiomegaly and mild chronic interstitial changes. Trace LEFT pleural effusion versus pleural thickening.   Assessment & Plan by Problem: Caroline [name] is a 66 y.o. female with a PMHx of CHF(EF 30-35%), kidney disease, heart disease who presents to the Emergency Department complaining of gradually worsening, acute on chronic shortness of breath that worsened today. Pt notes associated fluid build up in her abdomen and unable to urinate  Acute on chronic HFrEF. Recently admitted with the same complaint, no abdominal distention or bladder distention noted on exam. She was having positive troponin at 0.04 and BNP about 2000, little improved as compared to her previous hospitalization. Her pro BNP checked on 04/23/2017 was above 10,000. She had her echo done on 04/08/2017 which shows 30-35% ejection fraction. -Admit to telemetry. -Give her 1 dose of IV Lasix 80 mg, later she can resume her home dose of oral Lasix 80 mg twice a  day  Hyponatremia. She had an history of chronic hyponatremia, during previous hospitalization her workup was consistent with hypervolemic hyponatremia. -Fluid restriction with dialysis. -Monitor BMP  Hypotension. Patient hasn't history of softer blood pressure, unable to optimize her heart failure regimen as she was unable to tolerate beta blocker and ace inhibitor. -Monitor blood pressure.  CKD Stage 3-4. Her creatinine is close to her baseline. 3 days ago it was 1.61, baseline 1.8-1.9. -Continue to monitor.  CAD, Unable to optimized CAD treatment which include beta blocker and ace inhibitor because of her softer blood pressure. - continue home Aspirin, statin and Plavix  CODE STATUS. DNR Diet. Heart healthy DVT prophylaxis. Lovenox  Dispo: Admit patient to Observation with expected length of stay less than 2 midnights.  SignedLorella Nimrod, MD 04/26/2017, 5:36 AM  Pager: [phone]

## 2017-04-26 NOTE — ED Notes (Signed)
This RN to bedside. Patient complaining of increased shortness of breath. O2 saturation @ 95% upon my assessment. Breathing unlabored. NAD. Patient requesting oxygen via [place]. Applied per request for comfort.

## 2017-04-27 DIAGNOSIS — Z6822 Body mass index (BMI) 22.0-22.9, adult: Secondary | ICD-10-CM | POA: Diagnosis not present

## 2017-04-27 DIAGNOSIS — F3175 Bipolar disorder, in partial remission, most recent episode depressed: Secondary | ICD-10-CM

## 2017-04-27 DIAGNOSIS — E440 Moderate protein-calorie malnutrition: Secondary | ICD-10-CM | POA: Diagnosis present

## 2017-04-27 DIAGNOSIS — Z7189 Other specified counseling: Secondary | ICD-10-CM | POA: Diagnosis not present

## 2017-04-27 DIAGNOSIS — I255 Ischemic cardiomyopathy: Secondary | ICD-10-CM | POA: Diagnosis present

## 2017-04-27 DIAGNOSIS — I130 Hypertensive heart and chronic kidney disease with heart failure and stage 1 through stage 4 chronic kidney disease, or unspecified chronic kidney disease: Secondary | ICD-10-CM | POA: Diagnosis present

## 2017-04-27 DIAGNOSIS — E785 Hyperlipidemia, unspecified: Secondary | ICD-10-CM | POA: Diagnosis present

## 2017-04-27 DIAGNOSIS — I4891 Unspecified atrial fibrillation: Secondary | ICD-10-CM | POA: Diagnosis present

## 2017-04-27 DIAGNOSIS — N183 Chronic kidney disease, stage 3 (moderate): Secondary | ICD-10-CM | POA: Diagnosis not present

## 2017-04-27 DIAGNOSIS — I6521 Occlusion and stenosis of right carotid artery: Secondary | ICD-10-CM | POA: Diagnosis present

## 2017-04-27 DIAGNOSIS — Z515 Encounter for palliative care: Secondary | ICD-10-CM | POA: Diagnosis not present

## 2017-04-27 DIAGNOSIS — I5022 Chronic systolic (congestive) heart failure: Secondary | ICD-10-CM

## 2017-04-27 DIAGNOSIS — R627 Adult failure to thrive: Secondary | ICD-10-CM | POA: Diagnosis present

## 2017-04-27 DIAGNOSIS — F319 Bipolar disorder, unspecified: Secondary | ICD-10-CM | POA: Diagnosis present

## 2017-04-27 DIAGNOSIS — E876 Hypokalemia: Secondary | ICD-10-CM | POA: Diagnosis present

## 2017-04-27 DIAGNOSIS — I420 Dilated cardiomyopathy: Secondary | ICD-10-CM | POA: Diagnosis present

## 2017-04-27 DIAGNOSIS — R339 Retention of urine, unspecified: Secondary | ICD-10-CM | POA: Diagnosis present

## 2017-04-27 DIAGNOSIS — I428 Other cardiomyopathies: Secondary | ICD-10-CM | POA: Diagnosis present

## 2017-04-27 DIAGNOSIS — N059 Unspecified nephritic syndrome with unspecified morphologic changes: Secondary | ICD-10-CM | POA: Diagnosis present

## 2017-04-27 DIAGNOSIS — I5023 Acute on chronic systolic (congestive) heart failure: Secondary | ICD-10-CM | POA: Diagnosis not present

## 2017-04-27 DIAGNOSIS — I5043 Acute on chronic combined systolic (congestive) and diastolic (congestive) heart failure: Secondary | ICD-10-CM | POA: Diagnosis present

## 2017-04-27 DIAGNOSIS — E871 Hypo-osmolality and hyponatremia: Secondary | ICD-10-CM | POA: Diagnosis present

## 2017-04-27 DIAGNOSIS — I951 Orthostatic hypotension: Secondary | ICD-10-CM | POA: Diagnosis present

## 2017-04-27 DIAGNOSIS — Z66 Do not resuscitate: Secondary | ICD-10-CM | POA: Diagnosis present

## 2017-04-27 DIAGNOSIS — T502X5A Adverse effect of carbonic-anhydrase inhibitors, benzothiadiazides and other diuretics, initial encounter: Secondary | ICD-10-CM | POA: Diagnosis present

## 2017-04-27 DIAGNOSIS — I252 Old myocardial infarction: Secondary | ICD-10-CM | POA: Diagnosis not present

## 2017-04-27 DIAGNOSIS — I5084 End stage heart failure: Secondary | ICD-10-CM | POA: Diagnosis present

## 2017-04-27 DIAGNOSIS — I340 Nonrheumatic mitral (valve) insufficiency: Secondary | ICD-10-CM | POA: Diagnosis not present

## 2017-04-27 DIAGNOSIS — I2510 Atherosclerotic heart disease of native coronary artery without angina pectoris: Secondary | ICD-10-CM | POA: Diagnosis present

## 2017-04-27 DIAGNOSIS — N184 Chronic kidney disease, stage 4 (severe): Secondary | ICD-10-CM | POA: Diagnosis present

## 2017-04-27 LAB — BASIC METABOLIC PANEL
Anion gap: 10 (ref 5–15)
BUN: 22 mg/dL — ABNORMAL HIGH (ref 6–20)
CALCIUM: 9 mg/dL (ref 8.9–10.3)
CO2: 27 mmol/L (ref 22–32)
CREATININE: 1.89 mg/dL — AB (ref 0.44–1.00)
Chloride: 88 mmol/L — ABNORMAL LOW (ref 101–111)
GFR calc non Af Amer: 27 mL/min — ABNORMAL LOW (ref >60)
GFR, EST AFRICAN AMERICAN: 32 mL/min — AB (ref >60)
Glucose, Bld: 102 mg/dL — ABNORMAL HIGH (ref 65–99)
Potassium: 3.7 mmol/L (ref 3.5–5.1)
SODIUM: 125 mmol/L — AB (ref 135–145)

## 2017-04-27 LAB — UREA NITROGEN, URINE: UREA NITROGEN UR: 107 mg/dL

## 2017-04-27 LAB — CORTISOL-AM, BLOOD: Cortisol - AM: 9.4 ug/dL (ref 6.7–22.6)

## 2017-04-27 MED ORDER — ACETAMINOPHEN 325 MG PO TABS
650.0000 mg | ORAL_TABLET | Freq: Four times a day (QID) | ORAL | Status: DC | PRN
  Administered 2017-04-27: 650 mg via ORAL
  Filled 2017-04-27: qty 2

## 2017-04-27 MED ORDER — FUROSEMIDE 10 MG/ML IJ SOLN
40.0000 mg | Freq: Two times a day (BID) | INTRAMUSCULAR | Status: DC
  Administered 2017-04-27 – 2017-04-29 (×5): 40 mg via INTRAVENOUS
  Filled 2017-04-27 (×5): qty 4

## 2017-04-27 NOTE — Progress Notes (Signed)
   Subjective:  Patient states that her breathing is better than yesterday. She denies any chest pain. Urinary output is improving. She denies any swelling.  Objective:  Vital signs in last 24 hours: Vitals:   04/26/17 2300 04/27/17 0000 04/27/17 0033 04/27/17 0555  BP: (!) 90/57 (!) 91/57  102/66  Pulse: 91 88  80  Resp:  20  20  Temp:  97.3 F (36.3 C) 97.6 F (36.4 C) 97.6 F (36.4 C)  TempSrc:  Axillary Oral Axillary  SpO2: 100% 100% 100% 100%  Weight:    125 lb 3.2 oz (56.8 kg)  Height:       General: resting in bed, no acute distress Cardiac: RRR, 3/6 holosystolic murmur loudest at the apex Pulm: bibasilar crackles, no respiratory distress, no wheezing or rhonci Abd: soft, nontender, nondistended, BS present Ext: no pedal edema Neuro: flat affect, soft spoken   Assessment/Plan: Caroline [name] a 66 y.o.femalewith a PMHx of CHF(EF 30-35%), kidney disease, heart disease who presented to the ED complaining of gradually worsening, acute on chronic shortness of breath. Recently admitted for acute on chronic HFrEF exacerbation. Discharge weight was 123 lbs. TTE 04/08/2017 showed an EF of 30-35% with severe MR (not a surgical candidate per cardiology).   #Chronic HFrEF with Severe MR: Symptomatically improving. She has bibasilar crackles on exam without peripheral edema. UOP 2100 mLs last 24 hours, net down 1.8 L. Wt is 125 lbs. Will continue with IV diuretics today. She has not tolerated optimal medical management with beta-blockers or ACE-I/ARBs due to her chronic postural hypotension. - Lasix 40 mg IV BID - Supplement K - Daily weights - Strict I/Os - Monitor BMET - PT recommend HHPT - face to face ordered  #Hyponatremia:  Na 122 >> 127 >> 125. Studies are consistent with hypotonic hyponatremia, likely multifactorial related to diuretic use, chronic hyponatremia from her heart failure as well as possible medication effect from her atypical antipsychotic medications. -  Monitor BMP - Repeat TSH outpatient  #Hypotension: Patient has chronic postural hypotension, previously prescribed midodrine.  - Monitor blood pressure.  #CKD Stage 3-4 ## Urinary Retention Baseline creatinine is ~1.7. UOP is improving. Renal U/S was unremarkable. - Monitor creatinine  #CAD s/p PCI/DES 07/2016: - Continue home Aspirin, statin and Plavix  #Depression / Mood disorder: Patient has had a flat affect throughout admission. She has an outpatient regimen of Seroquel, Risperdal, and Klonopin. - continue home Seroquel 300 BID and 400 mg at night - continue home Risperdal - continue home Klonopin 1 mg TID  Dispo: Anticipated discharge in approximately 1-3 day(s).   Zada Finders, MD 04/27/2017, 11:25 AM

## 2017-04-27 NOTE — Progress Notes (Signed)
Medicine attending: I examined this patient today and I concur with the evaluation and management plan which will be subsequently detailed in the daily progress note by resident physician Dr. Romelle Starcher Molt.  67 year old woman admitted early yesterday with acute on chronic systolic heart failure with most recent echocardiogram done on June 6 showing estimated ejection fraction 30-35% with diffuse hypokinesis.  In addition, she has valvular heart disease with severe mitral regurgitation.  Cardiogram with sinus rhythm, first-degree AV block, signs of previous anterior infarct with Q waves in leads III and V1.  Recent June 6 admission for similar symptoms.  Discharge weight 123 pounds.  Current admission weight 125 pounds.  Patient stated dry weight 120 pounds. She is not felt to be an operative candidate for her mitral valve disease due to ischemic dilated cardiomyopathy.  She has underlying coronary artery disease status post drug eluting stent to the diagonal and obtuse marginal coronaries in September 2017 with previous coronary stenting done in 2005 and 2010. 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  Parenteral diuretics resumed at time of current admission.  Subjective improvement over the last 24 hours with reasonable diuresis.  Baseline BNP this admission 2018 compared with 2543 at time of June 6 admission and 1147 in May.  She has rales one fourth the way up bilaterally over both lungs.  No JVD.  No S3 gallop.  9-5/1 systolic murmur at the apex.  No peripheral edema or calf tenderness.  Impression: Acute on chronic systolic heart failure complicated by severe mitral valve disease Plan: Continue current management.  Consider addition of low-dose digoxin.

## 2017-04-27 NOTE — Evaluation (Addendum)
Physical Therapy Evaluation Patient Details Name: Caroline [name] MRN: [id number] DOB: 06/29/1951 Today's Date: 04/27/2017   History of Present Illness  Pt is a 66 y.o. female presenting with SoB and fatigue dx Acute on chronic HFrEF and resulting volume overload. PMH for CXR-Shifting pleural effusions with increased size on the left and decreased on right. PMHx: Anxiety, Arthritis, Afib, Bipolar disorder, Bright disease, CAD, Carotid artery occlusion, CHF, Depression, GERD, Graves disease, HTN, MI, SOB with exertion.  Clinical Impression  Pt admitted with above diagnosis. Pt currently with functional limitations due to the deficits listed below (see PT Problem List). Pt is min guard for safety with transfers and ambulation of 20 feet.  Pt will benefit from skilled PT to increase their independence and safety with mobility to allow discharge to the venue listed below.       Follow Up Recommendations Supervision for mobility/OOB;Home health PT    Equipment Recommendations  None recommended by PT    Recommendations for Other Services       Precautions / Restrictions Precautions Precautions: Fall Precaution Comments: pt reported multiple falls, would not elllaborate  Restrictions Weight Bearing Restrictions: No      Mobility  Bed Mobility               General bed mobility comments: seated on BSC at entry  Transfers Overall transfer level: Needs assistance Equipment used: None Transfers: Sit to/from Stand Sit to Stand: Min guard         General transfer comment: min guard for safety, no assist  Ambulation/Gait Ambulation/Gait assistance: Min guard Ambulation Distance (Feet): 20 Feet Assistive device: None Gait Pattern/deviations: Step-through pattern;Decreased step length - right;Decreased step length - left;Decreased stride length Gait velocity: decreased Gait velocity interpretation: Below normal speed for age/gender General Gait Details: pt refused longer walk  but agreed to walk around bed to get to recliner to sit up for breakfast, slow, slightly drifting gait, unsteady with no LoB, PT to asked pt to use RW pt refused.       Balance Overall balance assessment: Needs assistance Sitting-balance support: Feet supported;No upper extremity supported Sitting balance-Leahy Scale: Good     Standing balance support: During functional activity Standing balance-Leahy Scale: Fair Standing balance comment: able to stand for pericare                             Pertinent Vitals/Pain Pain Assessment: No/denies pain    Home Living Family/patient expects to be discharged to:: Private residence   Available Help at Discharge: Friend(s);Available 24 hours/day Type of Home: House Home Access: Stairs to enter Entrance Stairs-Rails: Can reach both Entrance Stairs-Number of Steps: 6 Home Layout: Two level;Bed/bath upstairs Home Equipment: Walker - 2 wheels;Cane - single point;Shower seat;Hand held shower head      Prior Function Level of Independence: Independent         Comments: household ambulator, friend that brings groceries and takes her to appointments     Hand Dominance        Extremity/Trunk Assessment   Upper Extremity Assessment Upper Extremity Assessment: Overall WFL for tasks assessed    Lower Extremity Assessment Lower Extremity Assessment: Overall WFL for tasks assessed       Communication   Communication: Other (comment) (slurred speach often have to repeat questions)  Cognition Arousal/Alertness: Awake/alert Behavior During Therapy: Flat affect Overall Cognitive Status: Within Functional Limits for tasks assessed  General Comments General comments (skin integrity, edema, etc.): SpO2 on 4L via nasal cannula 99%O2, HR 83 bpm        Assessment/Plan    PT Assessment Patient needs continued PT services  PT Problem List Decreased mobility;Decreased  safety awareness;Decreased balance       PT Treatment Interventions Therapeutic activities;Gait training;Stair training;Functional mobility training;Therapeutic exercise;Balance training;Patient/family education    PT Goals (Current goals can be found in the Care Plan section)  Acute Rehab PT Goals Patient Stated Goal: be able to clean her house PT Goal Formulation: With patient Time For Goal Achievement: 04/23/17 Potential to Achieve Goals: Fair    Frequency Min 3X/week   Barriers to discharge Decreased caregiver support         AM-PAC PT "6 Clicks" Daily Activity  Outcome Measure Difficulty turning over in bed (including adjusting bedclothes, sheets and blankets)?: None Difficulty moving from lying on back to sitting on the side of the bed? : None Difficulty sitting down on and standing up from a chair with arms (e.g., wheelchair, bedside commode, etc,.)?: None Help needed moving to and from a bed to chair (including a wheelchair)?: None Help needed walking in hospital room?: None Help needed climbing 3-5 steps with a railing? : A Little 6 Click Score: 23    End of Session Equipment Utilized During Treatment: Gait belt Activity Tolerance: Patient tolerated treatment well Patient left: in bed;with call bell/phone within reach;with chair alarm set Nurse Communication: Mobility status;Other (comment) (added to mobility team) PT Visit Diagnosis: Unsteadiness on feet (R26.81);Other abnormalities of gait and mobility (R26.89);Muscle weakness (generalized) (M62.81);Difficulty in walking, not elsewhere classified (R26.2)    Time: 2297-9892 PT Time Calculation (min) (ACUTE ONLY): 35 min   Charges:   PT Evaluation $PT Eval Moderate Complexity: 1 Procedure PT Treatments $Therapeutic Activity: 8-22 mins   PT G Codes:   PT G-Codes **NOT FOR INPATIENT CLASS** Functional Assessment Tool Used: AM-PAC 6 Clicks Basic Mobility Functional Limitation: Mobility: Walking and moving  around Mobility: Walking and Moving Around Current Status (J1941): At least 1 percent but less than 20 percent impaired, limited or restricted Mobility: Walking and Moving Around Goal Status [id number]): At least 1 percent but less than 20 percent impaired, limited or restricted    Benjamine Mola B. Migdalia Dk PT, DPT Acute Rehabilitation  [phone] Pager [phone]    [place] 04/27/2017, 10:08 AM

## 2017-04-27 NOTE — Progress Notes (Signed)
[facility]  Patient Status: Active (receiving services up to time of hospitalization)  [facility] is providing the following services: RN, PT and MSW  Would benefit from ST eval. Please order at discharge if you agree.  If patient discharges after hours, please call [phone].   Caroline [name] 04/27/2017, 3:03 PM

## 2017-04-27 NOTE — Progress Notes (Signed)
Pt c/o SOB.  O2 100% on 1.5 liters oxygen.  MD notified.

## 2017-04-28 DIAGNOSIS — I5023 Acute on chronic systolic (congestive) heart failure: Secondary | ICD-10-CM

## 2017-04-28 LAB — BASIC METABOLIC PANEL
ANION GAP: 10 (ref 5–15)
BUN: 23 mg/dL — AB (ref 6–20)
CALCIUM: 9.1 mg/dL (ref 8.9–10.3)
CO2: 26 mmol/L (ref 22–32)
CREATININE: 1.66 mg/dL — AB (ref 0.44–1.00)
Chloride: 93 mmol/L — ABNORMAL LOW (ref 101–111)
GFR calc Af Amer: 37 mL/min — ABNORMAL LOW (ref >60)
GFR, EST NON AFRICAN AMERICAN: 32 mL/min — AB (ref >60)
GLUCOSE: 103 mg/dL — AB (ref 65–99)
Potassium: 3.2 mmol/L — ABNORMAL LOW (ref 3.5–5.1)
Sodium: 129 mmol/L — ABNORMAL LOW (ref 135–145)

## 2017-04-28 MED ORDER — POTASSIUM CHLORIDE CRYS ER 20 MEQ PO TBCR
40.0000 meq | EXTENDED_RELEASE_TABLET | Freq: Once | ORAL | Status: AC
  Administered 2017-04-28: 40 meq via ORAL
  Filled 2017-04-28: qty 2

## 2017-04-28 MED ORDER — ATORVASTATIN CALCIUM 40 MG PO TABS
40.0000 mg | ORAL_TABLET | Freq: Every day | ORAL | Status: DC
  Administered 2017-04-28 – 2017-04-30 (×3): 40 mg via ORAL
  Filled 2017-04-28 (×3): qty 1

## 2017-04-28 NOTE — Progress Notes (Signed)
Pharmacist Heart Failure Core Measure Documentation  Assessment: Caroline [name] has an EF documented as 30-35% on 04/08/2017 by echo.  Rationale: Heart failure patients with left ventricular systolic dysfunction (LVSD) and an EF < 40% should be prescribed an angiotensin converting enzyme inhibitor (ACEI) or angiotensin receptor blocker (ARB) at discharge unless a contraindication is documented in the medical record.  This patient is not currently on an ACEI or ARB for HF.  This note is being placed in the record in order to provide documentation that a contraindication to the use of these agents is present for this encounter.  ACE Inhibitor or Angiotensin Receptor Blocker is contraindicated (specify all that apply)  []   ACEI allergy AND ARB allergy []   Angioedema []   Moderate or severe aortic stenosis []   Hyperkalemia [x]   Hypotension []   Renal artery stenosis [x]   Worsening renal function, preexisting renal disease or dysfunction   Manley Mason 04/28/2017 1:58 PM

## 2017-04-28 NOTE — Telephone Encounter (Signed)
Lm for rtc 

## 2017-04-28 NOTE — Progress Notes (Signed)
Medicine attending: I examined this patient today and I concur with the evaluation and management plan as recorded by resident physician Dr. Romelle Starcher Molt. Objectively she is improving.  No respiratory distress at rest.  Oxygen saturation 100% on 2 L nasal cannula oxygen.  Respiratory rate 17.  Persistent rales at the left base which are likely chronic.  Subjectively she still feels like she is dyspneic.  She has chronic significant depression and a flat affect.  Difficult to know for sure but I suspect that there is a psychological overlay to some of her symptoms. We would like to get our heart failure team involved in her management.  We feel she might be a candidate for an ICD placement given her advanced dilated ischemic cardiomyopathy and severe mitral regurgitation. Continue IV diuresis.  Weight has plateaued at 124 pounds.

## 2017-04-28 NOTE — Consult Note (Addendum)
Advanced Heart Failure Team Consult Note   Primary Physician: Primary HF Cardiologist:  Dr Aundra Dubin  Cardiologist: Dr Johnsie Cancel.   Reason for Consultation: Heart Failure   HPI:    Caroline [name] is seen today for evaluation of heart failure  at the request of Dr Beryle Beams .   Caroline [name] is a 66 year old with a history of depression, CKD Stage III, carotid stenosis RICA totally occluded, S/P L CEA, back pain, mitral regurgitation, CAD with PCI DES 06/2016 D1 and OM1, orthostatic hypotension, and chronic systolic heart failure mixed ICM/NICM.   Admitted 6/6 through 1/60/1093 for A/C systolic heart failure. Diuresed with IV lasix and later transitioned to 40 mg lasix twice daily. Discharge weight was 123 pounds.   Prior to admit was had increased sob with ADLs. Says she had increased lasix to 80 mg twice a day per Dr Johnsie Cancel. Poor results. No chest pain.Sleeping sitting up on the sofa.  Requires assistance with shopping. Lives alone. Has a friend to help with shopping.   Admitted with increased shortness of breath. CXR with mild congestion noted. Renal US was normal. Pertinent admission labs included: Hgb 11, troponin 0.04, BNP 2018, sodium 122, Creatinine 1.7.  Diuresing with IV lasix.   SOB moving in the bed.    ECHO 04/2017 EF 30-35% Severe MR, RV normal   LHC  06/2016 - PCI DES D1 and OM1  RHC/LHC 06/2016   There is no aortic valve stenosis.  1st Mrg lesion, 80 %stenosed just prior to its branch point  2nd Diag-1 lesion, 95 %stenosed just prior to the stented segment.  2nd Diag-2 overlapping stents, 35 %stenosed.  LV end diastolic pressure is normal.  Right heart catheter pressures are essentially normal. No pulmonary hypertension. Low/normal RV EDP, PCWP, RAP & RVP  Moderate to severely reduced output/index. No large V wave from potential MR   Review of Systems: [y] = yes, [ ]  = no   General: Weight gain [ ] ; Weight loss [ ] ; Anorexia [ ] ; Fatigue [ Y]; Fever [ ] ; Chills [  ]; Weakness [Y ]  Cardiac: Chest pain/pressure [ ] ; Resting SOB [Y]; Exertional SOB [Y ]; Orthopnea [ Y]; Pedal Edema [Y ]; Palpitations [ ] ; Syncope [ ] ; Presyncope [ ] ; Paroxysmal nocturnal dyspnea[ ]   Pulmonary: Cough [ ] ; Wheezing[ ] ; Hemoptysis[ ] ; Sputum [ ] ; Snoring [ ]   GI: Vomiting[ ] ; Dysphagia[ ] ; Melena[ ] ; Hematochezia [ ] ; Heartburn[ ] ; Abdominal pain [ ] ; Constipation [ ] ; Diarrhea [ ] ; BRBPR [ ]   GU: Hematuria[ ] ; Dysuria [ ] ; Nocturia[ ]   Vascular: Pain in legs with walking [ ] ; Pain in feet with lying flat [ ] ; Non-healing sores [ ] ; Stroke [ ] ; TIA [ ] ; Slurred speech [ ] ;  Neuro: Headaches[ ] ; Vertigo[ ] ; Seizures[ ] ; Paresthesias[ ] ;Blurred vision [ ] ; Diplopia [ ] ; Vision changes [ ]   Ortho/Skin: Arthritis [ ] ; Joint pain [Y ]; Muscle pain [ ] ; Joint swelling [ ] ; Back Pain [ ] ; Rash [ ]   Psych: Depression[Y ]; Anxiety[ ]   Heme: Bleeding problems [ ] ; Clotting disorders [ ] ; Anemia [ ]   Endocrine: Diabetes [ ] ; Thyroid dysfunction[ ]   Home Medications Prior to Admission medications   Medication Sig Start Date End Date Taking? Authorizing Provider  aspirin 81 MG tablet Take 1 tablet (81 mg total) by mouth daily. 07/05/16  Yes Barrett, Evelene Croon, PA-C  clonazePAM (KLONOPIN) 1 MG tablet Take 1 tablet (1 mg total) by mouth  3 (three) times daily. 04/13/17  Yes Zada Finders, MD  clopidogrel (PLAVIX) 75 MG tablet Take ONE (1) tablet by mouth once daily 02/19/17  Yes Josue Hector, MD  ferrous sulfate 325 (65 FE) MG tablet Take 325 mg by mouth daily with breakfast.   Yes [provider]  furosemide (LASIX) 40 MG tablet Take 2 tablets (80 mg total) by mouth 2 (two) times daily. 04/24/17  Yes Eileen Stanford, PA-C  nitroGLYCERIN (NITROSTAT) 0.4 MG SL tablet Place 1 tablet (0.4 mg total) under the tongue every 5 (five) minutes as needed for chest pain. 07/05/16  Yes Barrett, Evelene Croon, PA-C  potassium chloride SA (K-DUR,KLOR-CON) 20 MEQ tablet Take 1 tablet (20 mEq total) by  mouth 2 (two) times daily. 04/24/17  Yes Eileen Stanford, PA-C  QUEtiapine (SEROQUEL) 300 MG tablet Take 300 mg by mouth 2 (two) times daily.   Yes [provider]  QUEtiapine (SEROQUEL) 400 MG tablet Take 400 mg by mouth at bedtime.   Yes [provider]  risperiDONE (RISPERDAL) 2 MG tablet Take 2 mg by mouth 3 (three) times daily.   Yes [provider]    Past Medical History: Past Medical History:  Diagnosis Date  . Angina   . Anxiety   . Arthritis    "severe in my back" (07/02/2016)  . Atrial fibrillation ([place])   . Bipolar disorder ([place])   . Bright disease   . CAD (coronary artery disease)    [facility]  Diagonal instent restenosis treated 10/2005  . Cancer of right breast ([place]) 02-18-01  . Carotid artery occlusion   . CHF (congestive heart failure) ([place])    states she was told se had this in past by MD  . Chronic back pain   . Depression    02/18/2002 had shock treatment due to depresson, spouse passed away 02-19-00  . Dyslipidemia   . GERD (gastroesophageal reflux disease)   . Graves' disease    /notes 06/03/2010  . Heart murmur    states years ago  . Hypertension    states a long time ago-states she never took medication  . Non Q wave myocardial infarction ([place]) 03/2004   Archie Endo 06/03/2010  . Shortness of breath    with exertion    Past Surgical History: Past Surgical History:  Procedure Laterality Date  . BREAST LUMPECTOMY Right 02/18/01  . CARDIAC CATHETERIZATION N/A 07/03/2016   Procedure: Right/Left Heart Cath and Coronary Angiography;  Surgeon: Leonie Man, MD;  Location: [facility];  Service: Cardiovascular;  Laterality: N/A;  . CARDIAC CATHETERIZATION N/A 07/22/2016   Procedure: Coronary Stent Intervention;  Surgeon: Leonie Man, MD;  Location: [facility];  Service: Cardiovascular;  Laterality: N/A;  . CAROTID ENDARTERECTOMY  01/08/12   Left  . CORONARY ANGIOPLASTY  03/2009   Archie Endo 06/03/2010  . CORONARY ANGIOPLASTY WITH  STENT PLACEMENT  03/2004   Archie Endo 06/03/2010;  . CORONARY ANGIOPLASTY WITH STENT PLACEMENT      "I've got 3 stent in there" (07/02/2016)  . CORONARY STENT PLACEMENT  07/23/2016   STENT PROMUS PREM MR 2.5X12 drug eluting stent was successfully placed, and overlaps previously placed stent.  [name] ENDARTERECTOMY  01/08/2012   Procedure: ENDARTERECTOMY CAROTID;  Surgeon: Mal Misty, MD;  Location: [facility];  Service: Vascular;  Laterality: Left;  with Dacron patch angioplasty  . FRACTURE SURGERY  Feb 18, 2006   Broken Back  . JOINT REPLACEMENT    . MULTIPLE  TOOTH EXTRACTIONS  2016   "18 or 19"  . TEE WITHOUT CARDIOVERSION N/A 07/04/2016   Procedure: TRANSESOPHAGEAL ECHOCARDIOGRAM (TEE);  Surgeon: Larey Dresser, MD;  Location: [facility];  Service: Cardiovascular;  Laterality: N/A;  . TOTAL HIP ARTHROPLASTY Left 2011  . VAGINAL HYSTERECTOMY  1970s   partial    Family History: Family History  Problem Relation Age of Onset  . Coronary artery disease Father   . Prostate cancer Father        not sure  . Pancreatic cancer Father        not sure  . Cancer Father        Pancreatic and  Prostate  . Heart disease Father        Before age 81 and BPG  . Hyperlipidemia Father   . Heart attack Father   . Hypertension Mother   . Diabetes Sister   . Colon cancer Neg Hx     Social History: Social History   Social History  . Marital status: Widowed    Spouse name: N/A  . Number of children: N/A  . Years of education: N/A   Social History Main Topics  . Smoking status: Former Smoker    Packs/day: 0.75    Years: 49.00    Types: Cigarettes    Quit date: 08/04/2015  . Smokeless tobacco: Never Used  . Alcohol use No  . Drug use: No  . Sexual activity: No   Other Topics Concern  . None   Social History Narrative   Lives in [place], [place] alone. Independent in ADLs, relies on friend for IADLs/groceries. No family nearby. Unable to get transportation consistently to appointments. She has 1  daughter.  She is on disability.    Allergies:  No Known Allergies  Objective:    Vital Signs:   Temp:  [97.4 F (36.3 C)-97.8 F (36.6 C)] 97.5 F (36.4 C) (06/26 0536) Pulse Rate:  [83-90] 83 (06/26 0536) Resp:  [16-18] 17 (06/26 0536) BP: (94-95)/(54-63) 94/63 (06/26 0536) SpO2:  [100 %] 100 % (06/26 0536) Weight:  [124 lb (56.2 kg)] 124 lb (56.2 kg) (06/26 0536) Last BM Date: 04/26/17  Weight change: Filed Weights   04/26/17 0529 04/27/17 0555 04/28/17 0536  Weight: 124 lb 9.6 oz (56.5 kg) 125 lb 3.2 oz (56.8 kg) 124 lb (56.2 kg)    Intake/Output:   Intake/Output Summary (Last 24 hours) at 04/28/17 1034 Last data filed at 04/28/17 0800  Gross per 24 hour  Intake              480 ml  Output             1050 ml  Net             -570 ml      Physical Exam    General:  Chronically ill. dyspneic at rest.   HEENT: normal Neck: supple. JVP to ear . Carotids 2+ bilat; no bruits. No lymphadenopathy or thyromegaly appreciated. Cor: PMI nondisplaced. Regular rate & rhythm. No rubs, gallops or murmurs. Lungs: Crackles RLL LLL on 2 liters  Abdomen: soft, nontender, nondistended. No hepatosplenomegaly. No bruits or masses. Good bowel sounds. Extremities: no cyanosis, clubbing, rash, R and LLE trace edema. Warm.  Neuro: alert & orientedx3, cranial nerves grossly intact. moves all 4 extremities w/o difficulty. Affect flat   Telemetry   NSR 80s personally reviewed   EKG   NSR 93 bpm 1st degree heart block.   Labs  Basic Metabolic Panel:  Recent Labs Lab 04/23/17 1521 04/26/17 0009 04/26/17 0624 04/27/17 0324 04/28/17 0412  NA 131* 122* 127* 125* 129*  K 3.7 4.4 3.6 3.7 3.2*  CL 89* 86* 89* 88* 93*  CO2 25 24 30 27 26   GLUCOSE 100* 121* 104* 102* 103*  BUN 27 21* 20 22* 23*  CREATININE 1.61* 1.84* 1.76* 1.89* 1.66*  CALCIUM 9.8 9.1 9.3 9.0 9.1    Liver Function Tests: No results for input(s): AST, ALT, ALKPHOS, BILITOT, PROT, ALBUMIN in the last 168  hours. No results for input(s): LIPASE, AMYLASE in the last 168 hours. No results for input(s): AMMONIA in the last 168 hours.  CBC:  Recent Labs Lab 04/26/17 0009  WBC 6.8  HGB 11.0*  HCT 33.2*  MCV 81.6  PLT 279    Cardiac Enzymes: No results for input(s): CKTOTAL, CKMB, CKMBINDEX, TROPONINI in the last 168 hours.  BNP: BNP (last 3 results)  Recent Labs  03/09/17 1146 04/08/17 0328 04/26/17 0053  BNP 1,146.8* 2,543.0* 2,018.4*    ProBNP (last 3 results)  Recent Labs  04/23/17 1521  PROBNP 10,574*     CBG: No results for input(s): GLUCAP in the last 168 hours.  Coagulation Studies: No results for input(s): LABPROT, INR in the last 72 hours.  Other results: EKG: NSR 1st degree heart block 93 bpm    Imaging    No results found.   Medications:     Current Medications: . aspirin EC  81 mg Oral Daily  . clonazePAM  1 mg Oral TID  . clopidogrel  75 mg Oral Daily  . enoxaparin (LOVENOX) injection  30 mg Subcutaneous Q24H  . ferrous sulfate  325 mg Oral Q breakfast  . furosemide  40 mg Intravenous Q12H  . potassium chloride SA  20 mEq Oral BID  . QUEtiapine  300 mg Oral BID WC  . QUEtiapine  400 mg Oral QHS  . risperiDONE  2 mg Oral TID     Infusions:     Patient Profile  Caroline [name] is a 66 year old with a history of depression, CKD Stage III, carotid stenosis RICA totally occluded, S/P L CEA, back pain, mitral regurgitation, CAD with PCI DES 06/2016 D1 and OM1, and chronic systolic heart failure mixed ICM/NICM admitted with increased dyspnea and volume overload despite increasing po lasix.     Assessment/Plan   1. A/C Systolic Heart Failure - ICM/NIMC ECHO 04/2017 EF 20-25%.  Doubt she has 1 year to live so no role for ICD. Multiple readmits for HF.  NYHA IV. Volume status elevated. Continue IV lasix to try and get as dry as possible. Eventually would use torsemide. (PTA on lasix 80 mg twice a day).  Renal function ok BP to low for  bb/arb/sprio. With CKD no dig.  2. CKD Stage III- Creatinine baseline 1.7-.1.9. Renal US ok. 3. CAD- PCI DES 06/2016 D1 OM1. On asa and plavix. Add atorvastatin. Was on prior to admit. No bb with soft bp.  4. Hypokalemia- Supplement K  5. DNR- Consult palliative care. She is ok with referral.     Length of Stay: 1  Amy Clegg, NP  04/28/2017, 10:34 AM  Advanced Heart Failure Team Pager [phone] (M-F; 7a - 4p)  Please contact [facility] for night-coverage after hours (4p -7a ) and weekends on [email or web address]  Patient seen and examined with Darrick Grinder, NP. We discussed all aspects of the encounter. I agree with the assessment and plan as  stated above.   Echo reviewed personally EF 20-25% with moderate RV dysfunction and severe MR. Has now had at least 3 HF hospitalizations in last 6 weeks. Persistent NYHA IV. Unable to do ADLs. Currently volume overloaded. Will proceed with IV diuresis.   Overall I feel she has end-stage HF without any good options for advanced therapies despite her relatively young age. Depression also likely playing a role in her poor trajectory. Have recommended Palliative Care eval and possible Hospice. Not candidate for ICD given her severe HF trajectory.   Glori Bickers, MD  11:54 AM

## 2017-04-28 NOTE — Progress Notes (Signed)
   Subjective:  Patient reports breathing is 'not good.' She appears comfortable and is laying nearly flat. Complained of persistent respiratory distress yesterday afternoon.   Objective:  Vital signs in last 24 hours: Vitals:   04/27/17 0555 04/27/17 1333 04/27/17 2100 04/28/17 0536  BP: 102/66 (!) 95/58 (!) 94/54 94/63  Pulse: 80 90 86 83  Resp: 20 16 18 17   Temp: 97.6 F (36.4 C) 97.4 F (36.3 C) 97.8 F (36.6 C) 97.5 F (36.4 C)  TempSrc: Axillary Oral Oral Oral  SpO2: 100% 100% 100% 100%  Weight: 125 lb 3.2 oz (56.8 kg)   124 lb (56.2 kg)  Height:       General: resting in bed, no acute distress. Laying nearly flat in bed without SOB. Flat affect and soft spoken.  Cardiac: RRR, 3/6 holosystolic murmur loudest at the apex Pulm: left basilar crackles, otherwise clear. No respiratory distress. On 1.5 L via [place] per patient request.  Abd: soft, nontender, nondistended, BS present Ext: no peripheral edema Neuro: Strength and sensation grossly intact.   Assessment/Plan: Caroline [name] a 66 y.o.femalewith a PMHx of ICM s/p DES, CHF(EF 30-35%), CKD stage 3-4 who presented to the ED complaining of gradually worsening, acute on chronic shortness of breath. Recently admitted for acute on chronic HFrEF exacerbation. Discharge weight was 123 lbs. TTE 04/08/2017 showed an EF of 30-35% with severe MR (not a surgical candidate per cardiology). She has previously seen EP with consideration for ICD placement however no FU.   #Chronic HFrEF with Severe MR: Symptomatically improving on Lasix 40 mg IV BID yesterday. Wt is 124 lbs, net out ~2L yesterday. She continues to complain of respiratory distress however is clearly breathing comfortably on examination, is saturating well and looks euvolemic. Suspect patient is at or near her dry weight. She notes a dry weight of 120 however she has not been less than 123 for over a year.  Unfortunately, She has not tolerated optimal medical management with  beta-blockers or ACE-I/ARBs due to her chronic postural hypotension. - Have consulted heart failure, we appreciate their assistance! Suspect she is near dry weight however wonder if she would benefit from ICD? - Lasix 40 mg IV BID - Supplement K - Daily weights - Strict I/Os - Monitor BMET - PT recommend HHPT - face to face ordered  #Hyponatremia:  Na 122 >> 127 >> 125 >>129 which appears to be her baseline. Studies are consistent with hypotonic hyponatremia, likely multifactorial related to diuretic use, chronic hyponatremia from her heart failure as well as possible medication effect from her atypical antipsychotic medications. -Monitor BMET  #Hypotension: Patient has chronic postural hypotension, previously prescribed midodrine.  BP consistently 90's/50's during admission.  - Monitor blood pressure  #CKD Stage 3-4 ## Urinary Retention, Resolved Baseline creatinine is ~1.7, Cr 1.66 today. She is having good UOP now. Renal U/S was unremarkable. - Monitor creatinine  #CAD s/p PCI/DES 07/2016: -Continue home Aspirin, statin and Plavix  #Depression / Mood disorder: Patient has had a flat affect throughout admission. She has an outpatient regimen of Seroquel, Risperdal, and Klonopin. -continue home Seroquel 300 BID and 400 mg at night -continue home Risperdal -continue home Klonopin 1 mg TID  Dispo: Anticipated discharge tomorrow.   Shea Swalley, DO 04/28/2017, 8:34 AM

## 2017-04-29 ENCOUNTER — Inpatient Hospital Stay: Payer: Self-pay

## 2017-04-29 DIAGNOSIS — I255 Ischemic cardiomyopathy: Secondary | ICD-10-CM

## 2017-04-29 DIAGNOSIS — N183 Chronic kidney disease, stage 3 (moderate): Secondary | ICD-10-CM

## 2017-04-29 DIAGNOSIS — Z7189 Other specified counseling: Secondary | ICD-10-CM

## 2017-04-29 DIAGNOSIS — I340 Nonrheumatic mitral (valve) insufficiency: Secondary | ICD-10-CM

## 2017-04-29 DIAGNOSIS — Z515 Encounter for palliative care: Secondary | ICD-10-CM

## 2017-04-29 DIAGNOSIS — I519 Heart disease, unspecified: Secondary | ICD-10-CM

## 2017-04-29 LAB — BASIC METABOLIC PANEL
ANION GAP: 7 (ref 5–15)
BUN: 27 mg/dL — AB (ref 6–20)
CALCIUM: 9.2 mg/dL (ref 8.9–10.3)
CO2: 29 mmol/L (ref 22–32)
CREATININE: 1.7 mg/dL — AB (ref 0.44–1.00)
Chloride: 94 mmol/L — ABNORMAL LOW (ref 101–111)
GFR calc Af Amer: 36 mL/min — ABNORMAL LOW (ref >60)
GFR, EST NON AFRICAN AMERICAN: 31 mL/min — AB (ref >60)
GLUCOSE: 99 mg/dL (ref 65–99)
Potassium: 3.4 mmol/L — ABNORMAL LOW (ref 3.5–5.1)
Sodium: 130 mmol/L — ABNORMAL LOW (ref 135–145)

## 2017-04-29 LAB — MAGNESIUM: Magnesium: 1.9 mg/dL (ref 1.7–2.4)

## 2017-04-29 MED ORDER — SENNA 8.6 MG PO TABS
1.0000 | ORAL_TABLET | Freq: Every day | ORAL | Status: DC
  Administered 2017-04-29 – 2017-04-30 (×2): 8.6 mg via ORAL
  Filled 2017-04-29 (×2): qty 1

## 2017-04-29 MED ORDER — MORPHINE SULFATE (CONCENTRATE) 10 MG/0.5ML PO SOLN
5.0000 mg | ORAL | Status: DC | PRN
  Administered 2017-04-29 – 2017-05-01 (×5): 5 mg via ORAL
  Filled 2017-04-29 (×5): qty 0.5

## 2017-04-29 MED ORDER — POTASSIUM CHLORIDE CRYS ER 20 MEQ PO TBCR
40.0000 meq | EXTENDED_RELEASE_TABLET | Freq: Two times a day (BID) | ORAL | Status: DC
  Administered 2017-04-29 – 2017-04-30 (×3): 40 meq via ORAL
  Filled 2017-04-29 (×4): qty 2

## 2017-04-29 MED ORDER — POTASSIUM CHLORIDE CRYS ER 20 MEQ PO TBCR
20.0000 meq | EXTENDED_RELEASE_TABLET | Freq: Once | ORAL | Status: AC
  Administered 2017-04-29: 20 meq via ORAL
  Filled 2017-04-29: qty 1

## 2017-04-29 MED ORDER — FUROSEMIDE 10 MG/ML IJ SOLN
60.0000 mg | Freq: Two times a day (BID) | INTRAMUSCULAR | Status: DC
  Administered 2017-04-29 – 2017-04-30 (×3): 60 mg via INTRAVENOUS
  Filled 2017-04-29 (×4): qty 6

## 2017-04-29 NOTE — Discharge Summary (Signed)
Name: Caroline [name] MRN: [id number] DOB: 07-24-1951 66 y.o. PCP: Patient, No Pcp Per  Date of Admission: 04/26/2017 12:40 AM Date of Discharge: 04/29/2017 Attending Physician: Annia Belt, MD  Discharge Diagnosis: 1. Acute on Chronic End-Stage Systolic Congestive Heart Failure 2. Severe Mitral Regurgitation 3. Cardiac cachexia 4. Encounter for palliative care  5. Bipolar 1 disorder  Principal Problem:   Acute on chronic HFrEF (heart failure with reduced ejection fraction) (HCC) Active Problems:   Coronary atherosclerosis   Cardiomyopathy, ischemic   Bipolar 1 disorder, depressed, partial remission (HCC)   Malnutrition of moderate degree   Severe mitral regurgitation   Volume overload   Cardiac cachexia   Discharge Medications: Allergies as of 05/01/2017   No Known Allergies     Medication List    STOP taking these medications   ferrous sulfate 325 (65 FE) MG tablet   furosemide 40 MG tablet Commonly known as:  LASIX     TAKE these medications   acetaminophen 325 MG tablet Commonly known as:  TYLENOL Take 2 tablets (650 mg total) by mouth every 6 (six) hours as needed for mild pain, moderate pain or headache.   antiseptic oral rinse Liqd 15 mLs by Mouth Rinse route as needed for dry mouth.   aspirin 81 MG tablet Take 1 tablet (81 mg total) by mouth daily.   clonazePAM 1 MG tablet Commonly known as:  KLONOPIN Take 1 tablet (1 mg total) by mouth 3 (three) times daily.   clopidogrel 75 MG tablet Commonly known as:  PLAVIX Take ONE (1) tablet by mouth once daily   LORazepam 0.5 MG tablet Commonly known as:  ATIVAN Take 1 tablet (0.5 mg total) by mouth every 8 (eight) hours as needed for anxiety or sleep.   morphine CONCENTRATE 10 MG/0.5ML Soln concentrated solution Take 0.25 mLs (5 mg total) by mouth every 2 (two) hours as needed for moderate pain or shortness of breath (Increased work of breathing, agitation).   nitroGLYCERIN 0.4 MG SL  tablet Commonly known as:  NITROSTAT Place 1 tablet (0.4 mg total) under the tongue every 5 (five) minutes as needed for chest pain.   polyvinyl alcohol 1.4 % ophthalmic solution Commonly known as:  LIQUITEARS Place 1 drop into both eyes as needed for dry eyes.   potassium chloride SA 20 MEQ tablet Commonly known as:  K-DUR,KLOR-CON Take 2 tablets (40 mEq total) by mouth daily. Start taking on:  05/02/2017 What changed:  how much to take  when to take this   QUEtiapine 400 MG tablet Commonly known as:  SEROQUEL Take 400 mg by mouth at bedtime.   QUEtiapine 300 MG tablet Commonly known as:  SEROQUEL Take 300 mg by mouth 2 (two) times daily.   risperiDONE 2 MG tablet Commonly known as:  RISPERDAL Take 2 mg by mouth 3 (three) times daily.   senna 8.6 MG Tabs tablet Commonly known as:  SENOKOT Take 1 tablet (8.6 mg total) by mouth at bedtime.   torsemide 20 MG tablet Commonly known as:  DEMADEX Take 3 tablets (60 mg total) by mouth daily. Start taking on:  05/02/2017      Disposition and follow-up:   Ms.Caroline [name] was discharged from [facility] in stable condition.  At the hospital follow up visit please address:  1.  End-Stage Systolic CHF: Ensure compliance and tolerance with torsemide 60 mg daily and cessation of lasix. Ensure breathing more comfortably on PRN morphine.  Hospice: She was  discharged with home hospice with a variety of DME equipment. Encourage patient to continue working with hospice and reaching out should she require assistance.   2.  Labs / imaging needed at time of follow-up: None.  3.  Pending labs/ test needing follow-up: None.  Hospital Course by problem list: Principal Problem:   Acute on chronic HFrEF (heart failure with reduced ejection fraction) (HCC) Active Problems:   Coronary atherosclerosis   Cardiomyopathy, ischemic   Bipolar 1 disorder, depressed, partial remission (HCC)   Malnutrition of moderate degree    Severe mitral regurgitation   Volume overload   Cardiac cachexia   1. Acute on chronic systolic and diastolic heart failure Severe mitral regurgitation 66 year old female with severe systolic heart failure (echo 6/18 LV EF 30-35%, diffuse hypokinesis. Severe mitral regurgitation) admitted for the third time the past 6 months for heart failure exacerbation. Due to her chronic hypotension she cannot tolerate other evidence based medication such as beta blockers or Ace inhibitors. She been seen several times in the outpatient setting recently and her Lasix dose was increased however patient was only taking this medication as needed. She was diuresed net 8.7L liters with the assistance of the advanced heart failure team. She is not felt to be a good surgical candidate for mitral valve replacement for ICD placement given the advanced trajectory of her heart failure. Palliative care was consultation who had goals of care discussion with patient and her sister. They came to mutual agreement for home hospice. She was discharged home with these services, DME equipment and medications for comfort.   Hypotension She remained mostly hypotensive during admission, limiting diuresis. Hypotension also limits optimizing her heart failure medications as she would not tolerate beta-blockers or ace-inhibitors. She was previously on midodrine.   Hyponatremia hronic Renal Insufficiency  Asymptomatic with serum sodium 122 on admission. This improved to 131 at time of discharge with diuresis. Osmolality studies indicate hypovolemic hyponatremia, consistent with her chronic CHF. Cr remained at baseline of 1.8.   Bipolar 1 Disorder Patient has a flat affect. She was controlled on her home doses of seroquel and risperdal which were continued at discharge.   Discharge Vitals:   BP 108/60 (BP Location: Left Arm)   Pulse 88   Temp 97.7 F (36.5 C) (Oral)   Resp 20   Ht 5\' 3"  (1.6 m)   Wt 123 lb 6.4 oz (56 kg)   SpO2  100%   BMI 21.86 kg/m   Pertinent Labs, Studies, and Procedures:  CXR: stable cardiomegaly and chronic interstitial changes. Trace left pleural effusion Renal US: normal sized kidneys without hydronephrosis. Mild BL renal cortical thickening  Signed: Oliviya Gilkison, DO 04/29/2017, 3:54 PM   Pager: [phone]

## 2017-04-29 NOTE — Progress Notes (Signed)
Offered bath and oral care. Pt refused.

## 2017-04-29 NOTE — Plan of Care (Signed)
Problem: Bowel/Gastric: Goal: Will not experience complications related to bowel motility Outcome: Not Progressing Patient has not had a bowel movement during this admission.  Pt states her last BM was prior to admission.  Pt not progression towards goal.

## 2017-04-29 NOTE — Progress Notes (Addendum)
No charge note  Reviewed chart, spoke with the Attending Physician and attempted to see patient.  She politely asked me to come by later in the day.  PMT meeting scheduled with sister Arville Go (from [place]) and patient this afternoon.  Imogene Burn, [facility] Pager: [phone]

## 2017-04-29 NOTE — Progress Notes (Addendum)
Patient ID: Caroline [name], female   DOB: [birth year], 66 y.o.   MRN: [id number]     Advanced Heart Failure Rounding Note  Primary Cardiologist: Aundra Dubin  Subjective:    Patient reports ongoing dyspnea at all times.  She is minimally active at home due to dyspnea and back pain.  Flat affect.  She appears to have diuresed well overnight but she says that urine from the day before was counted in the total.  Weight is not down but suspect it was a bed weight.  She needs assistance to get to bedside commode.    Palliative care has been consulted and will be coming to see her.   Objective:   Weight Range: 123 lb 6.4 oz (56 kg) Body mass index is 21.86 kg/m.   Vital Signs:   Temp:  [97.4 F (36.3 C)-97.9 F (36.6 C)] 97.5 F (36.4 C) (06/27 0751) Pulse Rate:  [77-94] 79 (06/27 0814) Resp:  [18-20] 18 (06/27 0751) BP: (84-102)/(48-68) 85/56 (06/27 0814) SpO2:  [96 %-100 %] 99 % (06/27 0751) Weight:  [123 lb 6.4 oz (56 kg)-124 lb 4.8 oz (56.4 kg)] 123 lb 6.4 oz (56 kg) (06/27 0814) Last BM Date: 04/26/17  Weight change: Filed Weights   04/28/17 0536 04/29/17 0407 04/29/17 0814  Weight: 124 lb (56.2 kg) 124 lb 4.8 oz (56.4 kg) 123 lb 6.4 oz (56 kg)    Intake/Output:   Intake/Output Summary (Last 24 hours) at 04/29/17 1053 Last data filed at 04/29/17 0815  Gross per 24 hour  Intake              420 ml  Output             4250 ml  Net            -3830 ml      Physical Exam    General:  Well appearing. No resp difficulty HEENT: Normal Neck: Supple. JVP 12 cm.  No lymphadenopathy or thyromegaly appreciated. Cor: PMI lateral. Regular rate & rhythm. No rubs, gallops.  3/6 HSM apex. Lungs: Clear Abdomen: Soft, nontender, nondistended. No hepatosplenomegaly. No bruits or masses. Good bowel sounds. Extremities: No cyanosis, clubbing, rash, edema Neuro: Alert & orientedx3, cranial nerves grossly intact. moves all 4 extremities w/o difficulty. Affect flat.    Telemetry    Personally reviewed, NSR  Labs    CBC No results for input(s): WBC, NEUTROABS, HGB, HCT, MCV, PLT in the last 72 hours. Basic Metabolic Panel  Recent Labs  04/28/17 0412 04/29/17 0504  NA 129* 130*  K 3.2* 3.4*  CL 93* 94*  CO2 26 29  GLUCOSE 103* 99  BUN 23* 27*  CREATININE 1.66* 1.70*  CALCIUM 9.1 9.2  MG  --  1.9   Liver Function Tests No results for input(s): AST, ALT, ALKPHOS, BILITOT, PROT, ALBUMIN in the last 72 hours. No results for input(s): LIPASE, AMYLASE in the last 72 hours. Cardiac Enzymes No results for input(s): CKTOTAL, CKMB, CKMBINDEX, TROPONINI in the last 72 hours.  BNP: BNP (last 3 results)  Recent Labs  03/09/17 1146 04/08/17 0328 04/26/17 0053  BNP 1,146.8* 2,543.0* 2,018.4*    ProBNP (last 3 results)  Recent Labs  04/23/17 1521  PROBNP 10,574*     D-Dimer No results for input(s): DDIMER in the last 72 hours. Hemoglobin A1C No results for input(s): HGBA1C in the last 72 hours. Fasting Lipid Panel No results for input(s): CHOL, HDL, LDLCALC, TRIG, CHOLHDL, LDLDIRECT in the last 72 hours.  Thyroid Function Tests No results for input(s): TSH, T4TOTAL, T3FREE, THYROIDAB in the last 72 hours.  Invalid input(s): FREET3  Other results:   Imaging     No results found.   Medications:     Scheduled Medications: . aspirin EC  81 mg Oral Daily  . atorvastatin  40 mg Oral q1800  . clonazePAM  1 mg Oral TID  . clopidogrel  75 mg Oral Daily  . enoxaparin (LOVENOX) injection  30 mg Subcutaneous Q24H  . ferrous sulfate  325 mg Oral Q breakfast  . furosemide  40 mg Intravenous Q12H  . potassium chloride  20 mEq Oral Once  . potassium chloride SA  40 mEq Oral BID  . QUEtiapine  300 mg Oral BID WC  . QUEtiapine  400 mg Oral QHS  . risperiDONE  2 mg Oral TID     Infusions:   PRN Medications:  acetaminophen, nitroGLYCERIN    Patient Profile   66 yo with chronic systolic CHF (mixed ischemic/nonischemic), severe  probably functional MR, CKD, carotid stenosis, failure to thrive was admitted with acute on chronic systolic CHF.   Assessment/Plan   1. Acute/chronic systolic CHF: Mixed ischemic/nonischemic cardiomyopathy.  Echo this admission with EF 30-35%, severe MR.  On exam, she remains volume overloaded.  She has had problems with hypotension/orthostasis and has not tolerated HF meds.  - Continue Lasix at 60 mg IV bid today and replace K.  - No digoxin for now with GFR around 30.  - Cannot tolerate ACEI/ARB/ARNI, spironolactone, or BB.  - I think she has end-stage HF without any good options for advanced therapies despite her relatively 66 age. Depression and lack of mobility from back pain also play a large a role in her poor trajectory. Agree with palliative care evaluation and possible Hospice. Not candidate for ICD given her severe HF trajectory 2. Mitral regurgitation: Severe.  Likely functional. Surgical repair of functional MR is of questionable benefit and she would be at prohibitive risk given her poor mobility/functional status.  Mitraclip also would be unlikely to benefit her.  3. CAD: S/p DES to OM and D in 9/17.  She remains on ASA, Plavix, statin.  4. CKD: Stage 3.  She has Bright's disease.  Follow creatinine closely with diuresis.   5. Carotid stenosis: Occluded RICA.  6. Failure to thrive.   DNR  Length of Stay: 2  Loralie Champagne, MD  04/29/2017, 10:53 AM  Advanced Heart Failure Team Pager [phone] (M-F; 7a - 4p)  Please contact [facility] for night-coverage after hours (4p -7a ) and weekends on [email or web address]

## 2017-04-29 NOTE — Progress Notes (Signed)
   Subjective: Patient continues to report dyspnea and does appear to have some dyspnea today while laying nearly flat. Discussed HF recommendations and prognosis, seems to understand that medically there is not much more to do other than manage her volume with water pills. Asked palliative care to come back later.   Objective:  Vital signs in last 24 hours: Vitals:   04/28/17 1900 04/28/17 2354 04/29/17 0407 04/29/17 0751  BP: (!) 95/55 (!) 101/58 (!) 102/59 (!) 84/48  Pulse: 89 90 81 77  Resp: 18 18 20 18   Temp: 97.9 F (36.6 C) 97.9 F (36.6 C) 97.5 F (36.4 C) 97.5 F (36.4 C)  TempSrc: Oral Oral Oral Oral  SpO2: 96% 99% 100% 99%  Weight:   124 lb 4.8 oz (56.4 kg)   Height:       General: resting in bed, no acute distress. Laying nearly flat in bed, some SOB. Flat affect and soft spoken.  Cardiac: RRR, 3/6 holosystolic murmur loudest at the apex Pulm: CTA BL. No respiratory distress. On 1.5 L via [place] per patient request.  Abd: soft, nontender, nondistended, BS present Ext: no peripheral edema Neuro: Strength and sensation grossly intact.   Assessment/Plan: MALINI FLEMINGS a 66 y.o.femalewith a PMHx of ICM s/p DES, CHF(EF 30-35%), CKD stage 3-4 who presented to the ED complaining of gradually worsening, acute on chronic shortness of breath. Recently admitted for acute on chronic HFrEF exacerbation. Discharge weight was 123 lbs. TTE 04/08/2017 showed an EF of 30-35% with severe MR (not a surgical candidate per cardiology). She was being evaluated previously for possible ICD placement. Seen by HF team who do not feel she is good candidate given her severity of HF and life expectancy.   #Chronic HFrEF with Severe MR: Continues to diurese well. Net out 2.9 L in past 24 hours. Weight 123 lbs. Endorsed a "dry weight" of 120 however she has not been less than 123 >1 yr and believe 123 is more in line with her dry weight. She has not tolerated optimal medical management with BB or  ACEI/ARB due to chronic hypotension. HF team on board, we appreciate their expertise. Not good candidate for ICD.  -HF on board, appreciate assistance -Lasix recs per HF -Strict I/Os -Monitor BMET -PT recommend HHPT - face to face ordered -FU palliative care recs. Will have family meeting with patient and sister this afternoon  #Hyponatremia:  Na 122 >> 130 which appears to be her baseline. Studies c/w hypotonic hyponatremia, likely multifactorial related to diuretic use, chronic hyponatremia from her heart failure as well as possible medication effect from her atypical antipsychotic medications. -Monitor BMET  #Hypotension: Patient has chronic postural hypotension, previously prescribed midodrine.  BP consistently 90's/50's during admission.  -Monitor blood pressure  #CKD Stage 3-4 ## Urinary Retention, Resolved At baseline of 1.7.  Renal U/S was unremarkable. -Monitor creatinine  #CAD s/p PCI/DES 07/2016: -Continue Aspirin, statin and Plavix  #Depression / Mood disorder: Patient has had a flat affect throughout admission. -continue home Seroquel 300 BID and 400 mg at night -continue home Risperdal -continue home Klonopin 1 mg TID  Dispo: Anticipated discharge today or tomorrow.   Liberato Stansbery, DO 04/29/2017, 8:09 AM

## 2017-04-29 NOTE — Progress Notes (Signed)
Physical Therapy Treatment Patient Details Name: Caroline [name] MRN: [id number] DOB: [birth year] Today's Date: 04/29/2017    History of Present Illness Pt is a 66 y.o. female presenting with SoB and fatigue dx Acute on chronic HFrEF and resulting volume overload. PMH for CXR-Shifting pleural effusions with increased size on the left and decreased on right. PMHx: Anxiety, Arthritis, Afib, Bipolar disorder, Bright disease, CAD, Carotid artery occlusion, CHF, Depression, GERD, Graves disease, HTN, MI, SOB with exertion.    PT Comments    Pt is up to walk with PT today, noted her control of LLE requires using RLE as dominant limb to sequence on stairs.  Her plan is to get home with family and friend support, has limited tolerance for mobility outside of her treatment plan.  Will focus on increasing control of standing and balance, and work toward safe short gait and transfer distances since pt is not going to do anything aggressive with mobility at home.   Follow Up Recommendations  Supervision for mobility/OOB;Home health PT     Equipment Recommendations  None recommended by PT    Recommendations for Other Services       Precautions / Restrictions Precautions Precautions: Fall Precaution Comments: pt did not discuss fall history Restrictions Weight Bearing Restrictions: No    Mobility  Bed Mobility Overal bed mobility: Modified Independent                Transfers Overall transfer level: Needs assistance Equipment used: None Transfers: Sit to/from Stand Sit to Stand: Min guard         General transfer comment: cued hand placement  Ambulation/Gait Ambulation/Gait assistance: Min guard Ambulation Distance (Feet): 8 Feet (x 6) Assistive device: None Gait Pattern/deviations: Step-through pattern;Decreased stride length;Wide base of support;Trunk flexed Gait velocity: decreased Gait velocity interpretation: Below normal speed for age/gender General Gait Details: slow  pace and controlled her standing balance with no device   Stairs Stairs: Yes   Stair Management: No rails;With walker;Step to pattern Number of Stairs: 16    Wheelchair Mobility    Modified Rankin (Stroke Patients Only)       Balance Overall balance assessment: Needs assistance Sitting-balance support: Feet supported;No upper extremity supported Sitting balance-Leahy Scale: Good     Standing balance support: During functional activity Standing balance-Leahy Scale: Fair Standing balance comment: to control to set up for stair in her room                            Cognition Arousal/Alertness: Awake/alert Behavior During Therapy: WFL for tasks assessed/performed Overall Cognitive Status: Within Functional Limits for tasks assessed                                        Exercises      General Comments General comments (skin integrity, edema, etc.): pt using lengthened O2 line bedside, O2 sats 95% with cannula      Pertinent Vitals/Pain Pain Assessment: No/denies pain    Home Living                      Prior Function            PT Goals (current goals can now be found in the care plan section) Acute Rehab PT Goals Patient Stated Goal: to get stronger to manage getting back to house Progress  towards PT goals: Progressing toward goals    Frequency    Min 3X/week      PT Plan Current plan remains appropriate    Co-evaluation              AM-PAC PT "6 Clicks" Daily Activity  Outcome Measure  Difficulty turning over in bed (including adjusting bedclothes, sheets and blankets)?: None Difficulty moving from lying on back to sitting on the side of the bed? : None Difficulty sitting down on and standing up from a chair with arms (e.g., wheelchair, bedside commode, etc,.)?: None Help needed moving to and from a bed to chair (including a wheelchair)?: None Help needed walking in hospital room?: None Help needed  climbing 3-5 steps with a railing? : A Little 6 Click Score: 23    End of Session Equipment Utilized During Treatment: Gait belt;Oxygen Activity Tolerance: Patient tolerated treatment well Patient left: in bed;with call bell/phone within reach;with family/visitor present Nurse Communication: Mobility status PT Visit Diagnosis: Unsteadiness on feet (R26.81);Other abnormalities of gait and mobility (R26.89);Muscle weakness (generalized) (M62.81);Difficulty in walking, not elsewhere classified (R26.2)     Time: 8638-1771 PT Time Calculation (min) (ACUTE ONLY): 41 min  Charges:  $Gait Training: 8-22 mins $Therapeutic Activity: 23-37 mins                    G Codes:  Functional Assessment Tool Used: AM-PAC 6 Clicks Basic Mobility     Ramond Dial 04/29/2017, 5:50 PM   Mee Hives, PT MS Acute Rehab Dept. Number: [place] and [place]

## 2017-04-29 NOTE — Consult Note (Signed)
Consultation Note Date: 04/29/2017   Patient Name: Caroline [name]  DOB: 13-Dec-1950  MRN: [id number]  Age / Sex: 66 y.o., female  PCP: Patient, No Pcp Per Referring Physician: Annia Belt, MD  Reason for Consultation: Establishing goals of care  HPI/Patient Profile: 66 y.o. female  with past medical history of advanced heart failure, Bright's disease (CKD), BRCA, Graves, bipolar disorder and anxiety who was admitted on 04/26/2017 with an acute on chronic heart failure exacerbation.  She has been evaluated by internal medicine and the heart failure team and unfortunately therapies available for her heart failure are limited to symptom management.  Caroline [name] has had 3 hospitalizations in the past 6 months for heart failure.  Clinical Assessment and Goals of Care:  I have reviewed medical records including EPIC notes, labs and imaging, received report from Drs Caroline [name] and Caroline [name], assessed the patient and then met at the bedside along with her two sisters Caroline [name] and Caroline [name]  to discuss diagnosis prognosis, GOC, EOL wishes, disposition and options.  I introduced Palliative Medicine as specialized medical care for people living with serious illness. It focuses on providing relief from the symptoms and stress of a serious illness. The goal is to improve quality of life for both the patient and the family.  We discussed a brief life review of the patient.  She worked in the [employer], cars, Surveyor, mining..  She was married and has a daughter who is now in her mid 86s.  She lives alone and per her sister Caroline [name] is a hoarder who does not clean her house well.  She became very depressed after her husband died years ago and stopped caring for herself and her home. Then I assessed functional and nutritional status at home by gathering history. Caroline [name] doesn't eat well and doesn't sleep  well.  Fluid accumulation has affected every aspect of her life.  She is short of breath and tired.  Natural disease trajectory and expectations at EOL were discussed.  Advanced directives, concepts specific to code status, and rehospitalization were considered and discussed.  Caroline [name] would like to name an HCPOA because she does not want her daughter to make medical decisions for her.  She will think about who her HCPOA should be overnight and work with the Chaplain to complete a HCPOA during this hospitalization.  We discussed code status.  If she became so sick that her heart stopped or she stopped breathing - Caroline [name] does not want to be resuscitated.  I supported her in this decision.  Caroline [name] does not want to return to the hospital - partially because she feels she can not afford to do so - but also because she realizes that it will not improve her progressive heart failure.  Hospice and Palliative Care services outpatient were explained and offered.  Questions and concerns were addressed.  The family was encouraged to call with questions or concerns.  PMT will continue to support holistically.  Primary Decision Maker:  PATIENT  SUMMARY OF RECOMMENDATIONS     Chaplain requested to complete HCPOA with Caroline [name] (a living will is not necessary as she is a confirmed DNR)  I requested thru case management that Caroline [name] talk with Caroline [name] in the hopes that they may lift some the of stress of her hospital bills or be able to offer her suggestions.  Caroline [name] requests that her sister Caroline [name] be listed as one of her contacts here in the Hospital and with Hospice Caroline [name] on [phone]  Caroline [name] is in favor of trying comfort medications such as low dose oral morphine for dyspnea - particularly nocturnal dyspnea.  This was ordered PRN.  Senna for nightly for constipation   Case management order was placed for Hospice Services in Caroline [name]'s home.  Will need a hospital bed at  home.   Psycho-social/Spiritual:   Desire for further Chaplaincy support: yes  Prognosis:   < 6 months based on advanced heart failure (EF of 20 - 25) medically optimized as she is unable to tolerate ACEI/ARB/ARNI, spironolactone, or BB due to orthostatic hypotension, she is not a candidate for ICD, CKD stage 3, depression, and lack of mobility due to back pain.  She has had 3 hospitalizations in 6 weeks.  Discharge Planning: Home with Hospice      Primary Diagnoses: Present on Admission: . Volume overload . Coronary atherosclerosis . Cardiomyopathy, ischemic . Bipolar 1 disorder, depressed, partial remission (Caroline [name]) . Malnutrition of moderate degree . Severe mitral regurgitation . Acute on chronic HFrEF (heart failure with reduced ejection fraction) ([place])   I have reviewed the medical record, interviewed the patient and family, and examined the patient. The following aspects are pertinent.  Past Medical History:  Diagnosis Date  . Angina   . Anxiety   . Arthritis    "severe in my back" (07/02/2016)  . Atrial fibrillation ([place])   . Bipolar disorder ([place])   . Bright disease   . CAD (coronary artery disease)    Caroline [name]  Caroline [name]  Caroline [name] instent restenosis treated 10/2005  . Cancer of right breast ([place]) Jan 12, 2001  . Carotid artery occlusion   . CHF (congestive heart failure) ([place])    states she was told se had this in past by MD  . Chronic back pain   . Depression    Jan 12, 2002 had shock treatment due to depresson, spouse passed away 01/13/00  . Dyslipidemia   . GERD (gastroesophageal reflux disease)   . Graves' disease    /notes 06/03/2010  . Heart murmur    states years ago  . Hypertension    states a long time ago-states she never took medication  . Non Q wave myocardial infarction ([place]) 03/2004   Caroline [name] 06/03/2010  . Shortness of breath    with exertion   Social History   Social History  . Marital status: Widowed    Spouse name: N/A  . Number of children: N/A  . Years of  education: N/A   Social History Main Topics  . Smoking status: Former Smoker    Packs/day: 0.75    Years: 49.00    Types: Cigarettes    Quit date: 08/04/2015  . Smokeless tobacco: Never Used  . Alcohol use No  . Drug use: No  . Sexual activity: No   Other Topics Concern  . None   Social History Narrative   Lives in [place], [place] alone. Independent in ADLs, relies on friend for IADLs/groceries. No family nearby. Unable to get transportation consistently to appointments.  She has 1 daughter.  She is on disability.   Family History  Problem Relation Age of Onset  . Coronary artery disease Father   . Prostate cancer Father        not sure  . Pancreatic cancer Father        not sure  . Cancer Father        Pancreatic and  Prostate  . Heart disease Father        Before age 83 and BPG  . Hyperlipidemia Father   . Heart attack Father   . Hypertension Mother   . Diabetes Sister   . Colon cancer Neg Hx    Scheduled Meds: . aspirin EC  81 mg Oral Daily  . atorvastatin  40 mg Oral q1800  . clonazePAM  1 mg Oral TID  . clopidogrel  75 mg Oral Daily  . enoxaparin (LOVENOX) injection  30 mg Subcutaneous Q24H  . ferrous sulfate  325 mg Oral Q breakfast  . furosemide  60 mg Intravenous BID  . potassium chloride SA  40 mEq Oral BID  . QUEtiapine  300 mg Oral BID WC  . QUEtiapine  400 mg Oral QHS  . risperiDONE  2 mg Oral TID   Continuous Infusions: PRN Meds:.acetaminophen, morphine CONCENTRATE, nitroGLYCERIN No Known Allergies Review of Systems +fatigue, +dyspnea, +back pain, difficulty sleeping, anxiety  Physical Exam  Well developed female, A&O, cooperative, appropriate Resp no distress on n/c Abdomen swollen, nt, +bs  Vital Signs: BP 108/60 (BP Location: Left Arm)   Pulse 88   Temp 97.7 F (36.5 C) (Oral)   Resp 20   Ht '5\' 3"'$  (1.6 m)   Wt 56 kg (123 lb 6.4 oz)   SpO2 100%   BMI 21.86 kg/m  Pain Assessment: No/denies pain   Pain Score: 0-No pain   SpO2: SpO2:  100 % O2 Device:SpO2: 100 % O2 Flow Rate: .O2 Flow Rate (L/min): 2 L/min  IO: Intake/output summary:  Intake/Output Summary (Last 24 hours) at 04/29/17 1647 Last data filed at 04/29/17 1500  Gross per 24 hour  Intake              876 ml  Output             3150 ml  Net            -2274 ml    LBM: Last BM Date: 04/26/17 Baseline Weight: Weight: 56.5 kg (124 lb 9.6 oz) Most recent weight: Weight: 56 kg (123 lb 6.4 oz)     Palliative Assessment/Data:     Time In: 3:45 Time Out: 5:00 Time Total: 75 min. Greater than 50%  of this time was spent counseling and coordinating care related to the above assessment and plan.  Signed by: Imogene Burn, PA-C Palliative Medicine Pager: [phone]  Please contact Palliative Medicine Team phone at [phone] for questions and concerns.  For individual provider: See Shea Evans

## 2017-04-29 NOTE — Progress Notes (Signed)
Medicine attending:  I examined this patient today and I concur with the evaluation and management plan as recorded by resident physician Dr. Romelle Starcher Molt. We greatly appreciate cardiology input.  Cardiac status felt to be end-stage.  We reinforced and extended discussion that Dr.B had with the patient yesterday morning.  She does have 3 sisters, a brother, and a daughter.  Daughter lives in [place]. We discussed the advanced nature of her heart failure.  Our inability to significantly improve on the situation.  The benefit of hospice services after discharge.  The need to involve her family. Palliative care team has arranged a meeting this afternoon with 1 of the patient's sisters. We will continue parenteral diuretics pending further discussion with the family with respect to post hospital care. Clinical exam remains stable.  She continues to have subjective complaints of dyspnea.

## 2017-04-30 ENCOUNTER — Telehealth: Payer: Self-pay | Admitting: *Deleted

## 2017-04-30 LAB — BASIC METABOLIC PANEL
Anion gap: 9 (ref 5–15)
BUN: 24 mg/dL — AB (ref 6–20)
CHLORIDE: 94 mmol/L — AB (ref 101–111)
CO2: 31 mmol/L (ref 22–32)
CREATININE: 1.62 mg/dL — AB (ref 0.44–1.00)
Calcium: 9.2 mg/dL (ref 8.9–10.3)
GFR calc Af Amer: 38 mL/min — ABNORMAL LOW (ref >60)
GFR calc non Af Amer: 33 mL/min — ABNORMAL LOW (ref >60)
GLUCOSE: 94 mg/dL (ref 65–99)
POTASSIUM: 4 mmol/L (ref 3.5–5.1)
Sodium: 134 mmol/L — ABNORMAL LOW (ref 135–145)

## 2017-04-30 MED ORDER — SENNOSIDES-DOCUSATE SODIUM 8.6-50 MG PO TABS
2.0000 | ORAL_TABLET | Freq: Once | ORAL | Status: AC
  Administered 2017-04-30: 2 via ORAL
  Filled 2017-04-30: qty 2

## 2017-04-30 NOTE — Progress Notes (Signed)
PT Cancellation Note  Patient Details Name: Caroline [name] MRN: [id number] DOB: 07-13-1951   Cancelled Treatment:    Reason Eval/Treat Not Completed: Patient declined, no reason specified. Pt reports that she is comfortable right now and doesn't want to mobilize at this time. Will check back tomorrow.    Scheryl Marten PT, DPT  [phone]  04/30/2017, 1:48 PM

## 2017-04-30 NOTE — Progress Notes (Signed)
   Subjective: Seen and evaluated today at bedside. Continues to complain of dyspnea however a little improved. Found improvement with morphine PRN overnight.   Met with palliative care + sister yesterday. Appears patient is estranged from her daughter and wants her sister to be HCPOA. Patient has agreed to home hospice care.    Objective:  Vital signs in last 24 hours: Vitals:   04/29/17 0814 04/29/17 1058 04/29/17 2117 04/30/17 0528  BP: (!) 85/56 108/60 (!) 95/53 129/85  Pulse: 79 88 90 82  Resp:  _0 Temp:  97.7 F (36.5 C) 98.3 F (36.8 C) 98.1 F (36.7 C)  TempSrc:  Oral Oral Oral  SpO2:  100% 100% 97%  Weight: 123 lb 6.4 oz (56 kg)   124 lb 3.2 oz (56.3 kg)  Height:       General: resting in bed, no acute distress. Some SOB but looks more comfortable than yesterday. Flat affect and soft spoken.  Cardiac: RRR, 3/6 holosystolic murmur Pulm: CTA BL. No respiratory distress. On 1.5L via [place] Abd: soft, nontender, nondistended, BS present Ext: no peripheral edema Neuro: Strength and sensation grossly intact.   Assessment/Plan: SELISA TENSLEY a 66 y.o.femalewith a PMHx of ICM s/p DES, CHF(EF 30-35%), CKD stage 3-4 who presented to the ED complaining of gradually worsening, acute on chronic shortness of breath. Recently admitted for acute on chronic HFrEF exacerbation. Discharge weight was 123 lbs. TTE 04/08/2017 showed an EF of 30-35% with severe MR (not a surgical candidate per cardiology). She was being evaluated previously for possible ICD placement. Seen by HF team who do not feel she is good candidate given her severity of HF and life expectancy.   #Chronic HFrEF with Severe MR: Continues to have good UOP. Weight remains at 124 lbs although these are bed weights. She remains dyspneic at rest but morphine helped. Discussed with cardiology who agree patient still seems volume-up and would benefit from another day of IV diuresis to improve her SOB prior to dc home with  hospice.  -HF on board, appreciate assistance -Another day of IV diuresis per HF recs -FU palliative care recs, seems as if home with home hospice is the current plan. Will need assistance with arranging this.   #Hyponatremia:  Na 122 >> 134. Studies c/w hypotonic hyponatremia, likely multifactorial related to diuretic use, chronic hyponatremia from her heart failure as well as possible medication effect from her atypical antipsychotic medications. -Monitor BMET  #Hypotension: Chronic postural hypotension, previously prescribed midodrine.  BP consistently 90's/50's during admission.  -Monitor blood pressure  #CKD Stage 3-4 ## Urinary Retention, Resolved At baseline. Renal U/S was unremarkable. -Monitor creatinine  #CAD s/p PCI/DES 07/2016: -Continue Aspirin, statin and Plavix  #Depression / Mood disorder: Remains with flat affect.  -continue home Seroquel 300 BID and 400 mg at night -continue home Risperdal -continue home Klonopin 1 mg TID  Dispo: Anticipated discharge tomorrow once home hospice arranged.   Caroline Devaul, DO 04/30/2017, 10:43 AM

## 2017-04-30 NOTE — Progress Notes (Signed)
Responded to [facility] Consult to complete AD. AD done. Copies given to nurse for chart, agent and patient.  Prayed with patient per request.  Chaplain available as needed.   04/30/17 1100  Clinical Encounter Type  Visited With Patient;Health care provider  Visit Type Initial;Spiritual support  Referral From Nurse  Spiritual Encounters  Spiritual Needs Literature;Prayer;Emotional  Stress Factors  Patient Stress Factors Exhausted  Cristopher Peru, [facility], Pager [phone]

## 2017-04-30 NOTE — Progress Notes (Signed)
Daily Progress Note   Patient Name: Caroline [name]       Date: 04/30/2017 DOB: 05-29-1951  Age: 66 y.o. MRN#: [id number] Attending Physician: Annia Belt, MD Primary Care Physician: Patient, No Pcp Per Admit Date: 04/26/2017  Reason for Consultation/Follow-up: Establishing goals of care  Subjective: Caroline [name] states she is feeling ok after our extensive conversation yesterday with her 2 sisters.  The liquid morphine helped her breathing last night at bedtime.  She is comfortable with the plan of going home with hospice.  She asked for the phone numbers to call hospice if she felt poorly - I wrote these down for her.  I called the [facility] referral dept - they have Caroline [name]'s name and information and a hospital liasion should be seeing her today.   Assessment: Comfortable.   Patient Profile/HPI: 66 y.o. female  with past medical history of advanced heart failure, Bright's disease (CKD), BRCA, Graves, bipolar disorder and anxiety who was admitted on 04/26/2017 with an acute on chronic heart failure exacerbation.  She has been evaluated by internal medicine and the heart failure team and unfortunately therapies available for her heart failure are limited to symptom management.  Caroline [name] has had 3 hospitalizations in the past 6 weeks for heart failure.   Length of Stay: 3  Current Medications: Scheduled Meds:  . aspirin EC  81 mg Oral Daily  . atorvastatin  40 mg Oral q1800  . clonazePAM  1 mg Oral TID  . clopidogrel  75 mg Oral Daily  . enoxaparin (LOVENOX) injection  30 mg Subcutaneous Q24H  . ferrous sulfate  325 mg Oral Q breakfast  . furosemide  60 mg Intravenous BID  . potassium chloride SA  40 mEq Oral BID  . QUEtiapine  300 mg Oral BID WC  . QUEtiapine  400 mg Oral QHS  .  risperiDONE  2 mg Oral TID  . senna  1 tablet Oral QHS    Continuous Infusions:   PRN Meds: acetaminophen, morphine CONCENTRATE, nitroGLYCERIN  Physical Exam        Unchanged. Frail, pallor, fatigued and weak. A&O speech slightly garbled at times resp no distress - appears comfortable.  Vital Signs: BP 126/82 (BP Location: Left Arm)   Pulse 89   Temp 97.6 F (36.4 C) (Oral)   Resp 19  Ht '5\' 3"'$  (1.6 m)   Wt 56.3 kg (124 lb 3.2 oz)   SpO2 99%   BMI 22.00 kg/m  SpO2: SpO2: 99 % O2 Device: O2 Device: Nasal Cannula O2 Flow Rate: O2 Flow Rate (L/min): 1.5 L/min  Intake/output summary:  Intake/Output Summary (Last 24 hours) at 04/30/17 1402 Last data filed at 04/30/17 1258  Gross per 24 hour  Intake              460 ml  Output             2525 ml  Net            -2065 ml   LBM: Last BM Date: 04/25/17 Baseline Weight: Weight: 56.5 kg (124 lb 9.6 oz) Most recent weight: Weight: 56.3 kg (124 lb 3.2 oz)       Palliative Assessment/Data:    Flowsheet Rows     Most Recent Value  Intake Tab  Referral Department  Cardiology  Unit at Time of Referral  Cardiac/Telemetry Unit  Palliative Care Primary Diagnosis  Cardiac  Date Notified  04/28/17  Palliative Care Type  Return patient Palliative Care  Reason for referral  Clarify Goals of Care  Date of Admission  04/26/17  Date first seen by Palliative Care  04/29/17  # of days Palliative referral response time  1 Day(s)  # of days IP prior to Palliative referral  2  Clinical Assessment  Psychosocial & Spiritual Assessment  Palliative Care Outcomes      Patient Active Problem List   Diagnosis Date Noted  . Cardiac cachexia   . Palliative care encounter   . Encounter for hospice care discussion   . Volume overload 04/26/2017  . Pressure injury of skin 04/12/2017  . Severe mitral regurgitation 04/08/2017  . Malnutrition of moderate degree 08/11/2016  . Bipolar 1 disorder, depressed, partial remission ([place])  08/10/2016  . Syncope 08/09/2016  . Acute kidney injury superimposed on chronic kidney disease ([place]) 08/09/2016  . Coronary artery disease involving native coronary artery   . CAD S/P percutaneous coronary angioplasty   . Acute on chronic HFrEF (heart failure with reduced ejection fraction) ([place]) 07/02/2016  . 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 CKD (chronic kidney disease) 06/19/2015  . Adrenal insufficiency ([place])   . Hyponatremia 06/12/2015  . Orthostasis 06/11/2015  . Pubic ramus fracture ([place]) 06/09/2015  . Fracture of multiple pubic rami ([place]) 06/09/2015  . Chronic diarrhea 03/21/2015  . Cardiomyopathy, ischemic 01/23/2015  . 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 Chronic low back pain 05/21/2014  . Dizziness and giddiness 05/03/2014  . Difficulty speaking 05/03/2014  . Numbness-Bilateral arm / Leg 05/03/2014  . Aftercare following surgery of the circulatory system, [place] 05/03/2014  . Carotid artery occlusion with infarction ([place]) 04/26/2013  . Weight loss 04/23/2012  . Hot flashes 04/23/2012  . Anemia 04/23/2012  . Breast cancer ([place]) 04/23/2012  . Occlusion and stenosis of carotid artery without mention of cerebral infarction 01/06/2012  . Bruit 12/09/2011  . SMOKER 05/24/2009  . ANXIETY DEPRESSION 02/28/2009  . GRAVES DISEASE 02/17/2009  . Hyperlipidemia 02/17/2009  . Coronary atherosclerosis 02/17/2009  . GERD 02/17/2009    Palliative Care Plan    Recommendations/Plan:  Please discharge with Rx for Morphine liquid concentrate as she has on order in the [place] to follow at home.   Goals of Care and Additional Recommendations:  Limitations on Scope of Treatment:   Avoid Hospitalization, Full Comfort Care and Minimize Medications  Code Status:  Full code  Prognosis:   < 6 months  < 6 months based on advanced heart failure (EF of 20 - 25) medically optimized as she is unable to tolerate ACEI/ARB/ARNI, spironolactone, or BB due to  orthostatic hypotension, she is not a candidate for ICD, CKD stage 3, depression, and lack of mobility due to back pain.  She has had 3 hospitalizations in 6 weeks.    Discharge Planning:  Home with Hospice  Care plan was discussed with patient  Thank you for allowing the Palliative Medicine Team to assist in the care of this patient.  Total time spent:  15 min.     Greater than 50%  of this time was spent counseling and coordinating care related to the above assessment and plan.  Imogene Burn, PA-C Palliative Medicine  Please contact Palliative MedicineTeam phone at [phone] for questions and concerns between 7 am - 7 pm.   Please see AMION for individual provider pager numbers.

## 2017-04-30 NOTE — Progress Notes (Signed)
Medicine attending: I examined this patient today and I concur with the evaluation and management plan which will be subsequently documented in the daily progress note by resident physician Dr. Romelle Starcher Molt. We appreciate ongoing assistance from cardiology. We appreciate palliative care consultation. Cardiology feel she would benefit from an additional day of parenteral diuresis and then for her convenience, discontinue furosemide and replace with torsemide. At the advice of palliative care, as needed oral morphine initiated to palliate dyspnea. The patient has agreed to home hospice care. She is in the process of designating a healthcare power of attorney who will likely be 1 of her sisters, Arville Go.  Apparently she is estranged from her daughter. Anticipate discharge tomorrow.

## 2017-04-30 NOTE — Progress Notes (Signed)
Patient ID: Caroline [name], female   DOB: [birth year], 66 y.o.   MRN: [id number]     Advanced Heart Failure Rounding Note  Primary Cardiologist: Aundra Dubin  Subjective:    Patient reports ongoing dyspnea at all times.  She is minimally active at home due to dyspnea and back pain.  Flat affect.  She diuresed well again last week.  Weight is not down but it was a bed weight.  She needs assistance to get to bedside commode.   Plan now home with hospice.   Objective:   Weight Range: 124 lb 3.2 oz (56.3 kg) Body mass index is 22 kg/m.   Vital Signs:   Temp:  [97.7 F (36.5 C)-98.3 F (36.8 C)] 98.1 F (36.7 C) (06/28 0528) Pulse Rate:  [82-90] 82 (06/28 0528) Resp:  [20] 20 (06/28 0528) BP: (95-129)/(53-85) 129/85 (06/28 0528) SpO2:  [97 %-100 %] 97 % (06/28 0528) FiO2 (%):  [2 %] 2 % (06/27 1058) Weight:  [124 lb 3.2 oz (56.3 kg)] 124 lb 3.2 oz (56.3 kg) (06/28 0528) Last BM Date: 04/25/17  Weight change: Filed Weights   04/29/17 0407 04/29/17 0814 04/30/17 0528  Weight: 124 lb 4.8 oz (56.4 kg) 123 lb 6.4 oz (56 kg) 124 lb 3.2 oz (56.3 kg)    Intake/Output:   Intake/Output Summary (Last 24 hours) at 04/30/17 0852 Last data filed at 04/30/17 0534  Gross per 24 hour  Intake              936 ml  Output             2825 ml  Net            -1889 ml      Physical Exam    General:  Well appearing. No resp difficulty HEENT: Normal Neck: Supple. JVP 12 cm.  No lymphadenopathy or thyromegaly appreciated. Cor: PMI lateral. Regular rate & rhythm. No rubs, gallops.  3/6 HSM apex. Lungs: Clear Abdomen: Soft, nontender, nondistended. No hepatosplenomegaly. No bruits or masses. Good bowel sounds. Extremities: No cyanosis, clubbing, rash, edema Neuro: Alert & orientedx3, cranial nerves grossly intact. moves all 4 extremities w/o difficulty. Affect flat.    Telemetry   Personally reviewed, NSR  Labs    CBC No results for input(s): WBC, NEUTROABS, HGB, HCT, MCV, PLT in the last 72  hours. Basic Metabolic Panel  Recent Labs  04/29/17 0504 04/30/17 0401  NA 130* 134*  K 3.4* 4.0  CL 94* 94*  CO2 29 31  GLUCOSE 99 94  BUN 27* 24*  CREATININE 1.70* 1.62*  CALCIUM 9.2 9.2  MG 1.9  --    Liver Function Tests No results for input(s): AST, ALT, ALKPHOS, BILITOT, PROT, ALBUMIN in the last 72 hours. No results for input(s): LIPASE, AMYLASE in the last 72 hours. Cardiac Enzymes No results for input(s): CKTOTAL, CKMB, CKMBINDEX, TROPONINI in the last 72 hours.  BNP: BNP (last 3 results)  Recent Labs  03/09/17 1146 04/08/17 0328 04/26/17 0053  BNP 1,146.8* 2,543.0* 2,018.4*    ProBNP (last 3 results)  Recent Labs  04/23/17 1521  PROBNP 10,574*     D-Dimer No results for input(s): DDIMER in the last 72 hours. Hemoglobin A1C No results for input(s): HGBA1C in the last 72 hours. Fasting Lipid Panel No results for input(s): CHOL, HDL, LDLCALC, TRIG, CHOLHDL, LDLDIRECT in the last 72 hours. Thyroid Function Tests No results for input(s): TSH, T4TOTAL, T3FREE, THYROIDAB in the last 72 hours.  Invalid  input(s): FREET3  Other results:   Imaging    No results found.   Medications:     Scheduled Medications: . aspirin EC  81 mg Oral Daily  . atorvastatin  40 mg Oral q1800  . clonazePAM  1 mg Oral TID  . clopidogrel  75 mg Oral Daily  . enoxaparin (LOVENOX) injection  30 mg Subcutaneous Q24H  . ferrous sulfate  325 mg Oral Q breakfast  . furosemide  60 mg Intravenous BID  . potassium chloride SA  40 mEq Oral BID  . QUEtiapine  300 mg Oral BID WC  . QUEtiapine  400 mg Oral QHS  . risperiDONE  2 mg Oral TID  . senna  1 tablet Oral QHS    Infusions:   PRN Medications: acetaminophen, morphine CONCENTRATE, nitroGLYCERIN    Patient Profile   66 yo with chronic systolic CHF (mixed ischemic/nonischemic), severe probably functional MR, CKD, carotid stenosis, failure to thrive was admitted with acute on chronic systolic CHF.    Assessment/Plan   1. Acute/chronic systolic CHF: Mixed ischemic/nonischemic cardiomyopathy.  Echo this admission with EF 30-35%, severe MR.  On exam, she remains volume overloaded, would like to see her a little drier prior to discharge.  She has had problems with hypotension/orthostasis and has not tolerated HF meds.  - Continue Lasix at 60 mg IV bid one more day and replace K.  - No digoxin for now with GFR around 30.  - Cannot tolerate ACEI/ARB/ARNI, spironolactone, or BB.  - I think she has end-stage HF without any good options for advanced therapies despite her relatively young age. Depression and lack of mobility from back pain also play a large a role in her poor trajectory. I think home with hospice is a good idea here.  2. Mitral regurgitation: Severe.  Likely functional. Surgical repair of functional MR is of questionable benefit and she would be at prohibitive risk given her poor mobility/functional status.  Mitraclip also would be unlikely to benefit her.  3. CAD: S/p DES to OM and D in 9/17.  She remains on ASA, Plavix, statin.  4. CKD: Stage 3.  She has Bright's disease.  Follow creatinine closely with diuresis, stable so far.   5. Carotid stenosis: Occluded RICA.  6. Failure to thrive.  7. Disposition: Plan home with hospice, likely tomorrow after a bit more diuresis. Would send her out on torsemide 60 mg daily rather than Lasix to try to keep her breathing more comfortable.   Length of Stay: 3  Loralie Champagne, MD  04/30/2017, 8:52 AM  Advanced Heart Failure Team Pager [phone] (M-F; 7a - 4p)  Please contact [facility] for night-coverage after hours (4p -7a ) and weekends on [email or web address]

## 2017-04-30 NOTE — Telephone Encounter (Signed)
Hospice and pallative care calls and states they need to know who the pt's attending will be, gave them dr Latina Craver pager and if they cannot reach her they are to call triage back

## 2017-04-30 NOTE — Care Management Note (Signed)
Case Management Note  Patient Details  Name: Caroline [name] MRN: [id number] Date of Birth: 1950/11/24  Subjective/Objective:  Admitted with Heart Failure                Action/Plan: Patient will be returning home with hospice care; CM offered home hospice choice, patient chose [facility]; referral placed as requested. Noted orders for [facility] services; DCP to return home with hospice care (they will provide additional support as needed) not [place] services at this time. Hospice will arrange all DME that is needed for the patient at discharge. Expected Discharge Date:  05/01/2017              Expected Discharge Plan:  [place]  Discharge planning Services  CM Consult  [facility] Agency:  [facility] [place]  Status of Service:  In process, will continue to follow  Sherrilyn Rist [phone] 04/30/2017, 11:26 AM

## 2017-04-30 NOTE — Progress Notes (Addendum)
[facility] Liaison:  RN visit  Notified by Hassan Rowan [facility], of patient/family request for [facility] services at home after discharge.  Chart and patient information and being reviewed with [facility] physician.  Hospice eligibility pending at this time.    Writer spoke with Loistine Chance, daughter, over the phone to initiate education related to hospice philosophy, services and team approach to care.  Family verbalized understanding of information given.  Visited with patient at bedside for same.  Per discussion, plan is for discharge to home by private vehicle on 05/01/17.  Please send signed and completed DNR form home with patient/family.  Patient will need prescriptions for discharge comfort medications.   DME needs have been discussed, patient currently has walker and BSC in the home.  Patient/family requests the following DMR for delivery to the home today:  O2 concentrator, hospital bed, OBT.  [facility] equipment manager, Gayland Curry, has been notified and will contact [place] to arrange delivery to the home.  The home address has been verified and is correct in the chart.  Enid Derry, is the neighbor, she will be the person to contact for delivery of equipment.  Shirley's number is [phone].  [facility] is aware of the above.  Completed discharge summary will need to be faxed to [facility] at [phone], when final.  Please notify [facility] when patient is ready to leave the unit at discharge.  Call [phone] or [phone] if after 5pm.  [facility] information and contact numbers have been given to patient during visit.  Above information shared with Hassan Rowan, [facility].  Please call with any hospice related questions.  Thank you for the referral,  Edyth Gunnels, RN, BSN [facility] Liaison [phone]  All hospital liaisons are now on [place].

## 2017-04-30 NOTE — Progress Notes (Signed)
Patient slept well last pm.  Morphine given, patient tolerated well.  No discomfort or dyspnea noted.  No changes in assessment.  Cardiac rhythm remained unchanged.  All questions and concerns addressed.  Will continue to monitor and report off to day RN.

## 2017-05-01 ENCOUNTER — Telehealth: Payer: Self-pay | Admitting: Cardiovascular Disease

## 2017-05-01 LAB — BASIC METABOLIC PANEL
Anion gap: 9 (ref 5–15)
BUN: 24 mg/dL — AB (ref 6–20)
CALCIUM: 9.2 mg/dL (ref 8.9–10.3)
CO2: 29 mmol/L (ref 22–32)
Chloride: 93 mmol/L — ABNORMAL LOW (ref 101–111)
Creatinine, Ser: 1.75 mg/dL — ABNORMAL HIGH (ref 0.44–1.00)
GFR calc Af Amer: 35 mL/min — ABNORMAL LOW (ref >60)
GFR, EST NON AFRICAN AMERICAN: 30 mL/min — AB (ref >60)
GLUCOSE: 87 mg/dL (ref 65–99)
Potassium: 4.3 mmol/L (ref 3.5–5.1)
SODIUM: 131 mmol/L — AB (ref 135–145)

## 2017-05-01 MED ORDER — POLYVINYL ALCOHOL 1.4 % OP SOLN: 1.0000 [drp] | OPHTHALMIC | 0 refills | Status: AC | PRN

## 2017-05-01 MED ORDER — POTASSIUM CHLORIDE CRYS ER 20 MEQ PO TBCR: 40.0000 meq | EXTENDED_RELEASE_TABLET | Freq: Every day | ORAL | 0 refills | Status: AC

## 2017-05-01 MED ORDER — TORSEMIDE 20 MG PO TABS
60.0000 mg | ORAL_TABLET | Freq: Every day | ORAL | Status: DC
  Administered 2017-05-01: 60 mg via ORAL
  Filled 2017-05-01: qty 3

## 2017-05-01 MED ORDER — TORSEMIDE 20 MG PO TABS: 60.0000 mg | ORAL_TABLET | Freq: Every day | ORAL | 1 refills | Status: AC

## 2017-05-01 MED ORDER — POTASSIUM CHLORIDE CRYS ER 20 MEQ PO TBCR
40.0000 meq | EXTENDED_RELEASE_TABLET | Freq: Every day | ORAL | Status: DC
  Administered 2017-05-01: 40 meq via ORAL
  Filled 2017-05-01: qty 2

## 2017-05-01 MED ORDER — BIOTENE DRY MOUTH MT LIQD
15.0000 mL | OROMUCOSAL | 0 refills | Status: AC | PRN
Start: 2017-05-01 — End: ?

## 2017-05-01 MED ORDER — ACETAMINOPHEN 325 MG PO TABS: 650.0000 mg | ORAL_TABLET | Freq: Four times a day (QID) | ORAL | 0 refills | Status: AC | PRN

## 2017-05-01 MED ORDER — SENNA 8.6 MG PO TABS: 1.0000 | ORAL_TABLET | Freq: Every day | ORAL | 0 refills | Status: AC

## 2017-05-01 MED ORDER — TORSEMIDE 20 MG PO TABS: 60.0000 mg | ORAL_TABLET | Freq: Every day | ORAL | 1 refills | Status: DC

## 2017-05-01 MED ORDER — MORPHINE SULFATE (CONCENTRATE) 10 MG/0.5ML PO SOLN: 5.0000 mg | ORAL | 0 refills | Status: AC | PRN

## 2017-05-01 MED ORDER — LORAZEPAM 0.5 MG PO TABS: 0.5000 mg | ORAL_TABLET | Freq: Three times a day (TID) | ORAL | 0 refills | Status: AC | PRN

## 2017-05-01 NOTE — Care Management Important Message (Signed)
Important Message  Patient Details  Name: Caroline [name] MRN: [id number] Date of Birth: 09-21-1951   Medicare Important Message Given:  Yes    Orbie Pyo 05/01/2017, 12:37 PM

## 2017-05-01 NOTE — Progress Notes (Signed)
Patient ID: Caroline [name], female   DOB: Sep 05, 1951, 66 y.o.   MRN: [id number]     Advanced Heart Failure Rounding Note  Primary Cardiologist: Aundra Dubin  Subjective:    Dyspnea on and off, overall thinks she may feel a bit better.  She is minimally active at home due to dyspnea and back pain.  Flat affect.  She diuresed well again last night.  Weight is not down but it was a bed weight.  She needs assistance to get to bedside commode.    Plan now home with hospice.   Objective:   Weight Range: 124 lb 3.2 oz (56.3 kg) Body mass index is 22 kg/m.   Vital Signs:   Temp:  [97.6 F (36.4 C)-98.2 F (36.8 C)] 98.2 F (36.8 C) (06/29 0621) Pulse Rate:  [82-90] 90 (06/29 0621) Resp:  [18-19] 18 (06/29 0621) BP: (91-126)/(59-82) 91/72 (06/29 0621) SpO2:  [97 %-99 %] 99 % (06/29 0621) Weight:  [124 lb 3.2 oz (56.3 kg)] 124 lb 3.2 oz (56.3 kg) (06/29 0621) Last BM Date: 04/25/17  Weight change: Filed Weights   04/29/17 0814 04/30/17 0528 05/01/17 0621  Weight: 123 lb 6.4 oz (56 kg) 124 lb 3.2 oz (56.3 kg) 124 lb 3.2 oz (56.3 kg)    Intake/Output:   Intake/Output Summary (Last 24 hours) at 05/01/17 0824 Last data filed at 05/01/17 0622  Gross per 24 hour  Intake              720 ml  Output             1800 ml  Net            -1080 ml      Physical Exam    General:  NAD HEENT: Normal Neck: Supple. JVP 9-10 cm.  No lymphadenopathy or thyromegaly appreciated. Cor: PMI lateral. Regular rate & rhythm. No rubs, gallops.  3/6 HSM apex. Lungs: Clear Abdomen: Soft, nontender, nondistended. No hepatosplenomegaly. No bruits or masses. Good bowel sounds. Extremities: No cyanosis, clubbing, rash, edema Neuro: Alert & orientedx3, cranial nerves grossly intact. moves all 4 extremities w/o difficulty. Affect flat.    Telemetry   Personally reviewed, NSR  Labs    CBC No results for input(s): WBC, NEUTROABS, HGB, HCT, MCV, PLT in the last 72 hours. Basic Metabolic Panel  Recent  Labs  04/29/17 0504 04/30/17 0401 05/01/17 0348  NA 130* 134* 131*  K 3.4* 4.0 4.3  CL 94* 94* 93*  CO2 29 31 29   GLUCOSE 99 94 87  BUN 27* 24* 24*  CREATININE 1.70* 1.62* 1.75*  CALCIUM 9.2 9.2 9.2  MG 1.9  --   --    Liver Function Tests No results for input(s): AST, ALT, ALKPHOS, BILITOT, PROT, ALBUMIN in the last 72 hours. No results for input(s): LIPASE, AMYLASE in the last 72 hours. Cardiac Enzymes No results for input(s): CKTOTAL, CKMB, CKMBINDEX, TROPONINI in the last 72 hours.  BNP: BNP (last 3 results)  Recent Labs  03/09/17 1146 04/08/17 0328 04/26/17 0053  BNP 1,146.8* 2,543.0* 2,018.4*    ProBNP (last 3 results)  Recent Labs  04/23/17 1521  PROBNP 10,574*     D-Dimer No results for input(s): DDIMER in the last 72 hours. Hemoglobin A1C No results for input(s): HGBA1C in the last 72 hours. Fasting Lipid Panel No results for input(s): CHOL, HDL, LDLCALC, TRIG, CHOLHDL, LDLDIRECT in the last 72 hours. Thyroid Function Tests No results for input(s): TSH, T4TOTAL, T3FREE, THYROIDAB in  the last 72 hours.  Invalid input(s): FREET3  Other results:   Imaging    No results found.   Medications:     Scheduled Medications: . aspirin EC  81 mg Oral Daily  . atorvastatin  40 mg Oral q1800  . clonazePAM  1 mg Oral TID  . clopidogrel  75 mg Oral Daily  . enoxaparin (LOVENOX) injection  30 mg Subcutaneous Q24H  . ferrous sulfate  325 mg Oral Q breakfast  . potassium chloride SA  40 mEq Oral BID  . QUEtiapine  300 mg Oral BID WC  . QUEtiapine  400 mg Oral QHS  . risperiDONE  2 mg Oral TID  . senna  1 tablet Oral QHS  . torsemide  60 mg Oral Daily    Infusions:   PRN Medications: acetaminophen, morphine CONCENTRATE, nitroGLYCERIN    Patient Profile   66 yo with chronic systolic CHF (mixed ischemic/nonischemic), severe probably functional MR, CKD, carotid stenosis, failure to thrive was admitted with acute on chronic systolic CHF.    Assessment/Plan   1. Acute/chronic systolic CHF: Mixed ischemic/nonischemic cardiomyopathy.  Echo this admission with EF 30-35%, severe MR.  She has had problems with hypotension/orthostasis and has not tolerated HF meds.  Mild volume overload on exam but diuresed again overnight.  Weights not accurate (bed weights).  - Transition to torsemide 60 mg daily today for home.  Will give KCl 40 daily.   - No digoxin for now with GFR around 30.  - Cannot tolerate ACEI/ARB/ARNI, spironolactone, or BB.  - I think she has end-stage HF without any good options for advanced therapies despite her relatively young age. Depression and lack of mobility from back pain also play a large a role in her poor trajectory. I think home with hospice is a good idea here.  2. Mitral regurgitation: Severe.  Likely functional. Surgical repair of functional MR is of questionable benefit and she would be at prohibitive risk given her poor mobility/functional status.  Mitraclip also would be unlikely to benefit her.  3. CAD: S/p DES to OM and D in 9/17.  She remains on ASA, Plavix, statin.  4. CKD: Stage 3.  She has Bright's disease.  Stable with diuresis.   5. Carotid stenosis: Occluded RICA.  6. Failure to thrive.  7. Disposition: Home today with hospice.  Would have her on torsemide 60 daily + KCl 40 daily.   Length of Stay: [place], MD  05/01/2017, 8:24 AM  Advanced Heart Failure Team Pager [phone] (M-F; 7a - 4p)  Please contact [facility] for night-coverage after hours (4p -7a ) and weekends on [email or web address]

## 2017-05-01 NOTE — Progress Notes (Signed)
[facility] called for portable oxygen tank for home; Per Amy with [facility], patient has been set up for home oxygen at discharge; Aneta Mins [phone]

## 2017-05-01 NOTE — Telephone Encounter (Signed)
New Message  Amy from [facility] called requesting to speak with RN. She states Caroline [name] wants Dr. Johnsie Cancel to be her attendant in hospice. Please call back to discuss

## 2017-05-01 NOTE — Progress Notes (Signed)
   Subjective: Seen and evaluated today at bedside. Breathing improved. Looking forward to dc home. She feels she needs to be at home to receive her hospital bed. CM confirms daughter will be at home today from 11-3 to receive. Also, pt notes her friend will be picking her up around 6-7. Wonder if daughter could provide transport?   Objective:  Vital signs in last 24 hours: Vitals:   04/30/17 0528 04/30/17 1251 04/30/17 2041 05/01/17 0621  BP: 129/85 126/82 (!) 106/59 91/72  Pulse: 82 89 82 90  Resp: '20 19 18 18  '$ Temp: 98.1 F (36.7 C) 97.6 F (36.4 C) 97.6 F (36.4 C) 98.2 F (36.8 C)  TempSrc: Oral Oral Oral Oral  SpO2: 97% 99% 97% 99%  Weight: 124 lb 3.2 oz (56.3 kg)   124 lb 3.2 oz (56.3 kg)  Height:       General: resting in bed, no acute distress. Appears comfortable.  Cardiac: RRR, 3/6 holosystolic murmur Pulm: CTA BL. No respiratory distress. No Caroline [name]. Abd: soft, nontender, nondistended, BS present Ext: no peripheral edema  Assessment/Plan: Caroline [name] a 66 y.o.femalewith a PMHx of ICM s/p DES, CHF(EF 30-35%), CKD stage 3-4 who presented to the ED complaining of gradually worsening, acute on chronic shortness of breath. She was being evaluated previously for possible ICD placement. Seen by HF team who do not feel she is good candidate for ICD nor MV replacement given her severity of HF and life expectancy.   #Chronic HFrEF with Severe MR: Good UOP but weight remains at 124 lbs. She remains dyspneic at rest and finds relief with morphine. Met again with palliative medicine who are arranging home hospice for patient. She will be ready for discharge home today. Per CM, daughter will be at home today to receive DME delivery between 11-3 pm. Wonder if daughter could provide transport?  -Cant tolerate medication optimization with BB ACE-I.  -HF on board. Will dc with Torsemide 60 mg daily. -FU palliative care recs. Patient will be dc home with home hospice today. Appreciate  assistance in arranging!  #Hyponatremia:  Na 122 >> 131. Studies c/w hypotonic hyponatremia, likely multifactorial. -Monitor BMET  #Hypotension: Chronic postural hypotension, previously prescribed midodrine.  BP consistently 90's/50's during admission.  -Monitor blood pressure  #CKD Stage 3-4 ## Urinary Retention, Resolved At baseline. Renal U/S was unremarkable. -Monitor creatinine  #CAD s/p PCI/DES 07/2016: -Continue Aspirin, statin and Plavix  #Depression / Mood disorder: Remains with flat affect.  -continue home Seroquel 300 BID and 400 mg at night -continue home Risperdal -continue home Klonopin 1 mg TID  Dispo: Anticipated discharge today once home hospice arranged.   Caroline Probert, DO 05/01/2017, 8:12 AM

## 2017-05-01 NOTE — Progress Notes (Signed)
Patient to be discharged home with Hospice at home; the DME has been ordered and delivery is set between 11-3 pm today; patient is to go home after the hospital bed has arrived at the home; Daughter Loistine Chance [phone]) will be receiving the DME; Aneta Mins [phone]

## 2017-05-01 NOTE — Telephone Encounter (Signed)
Don't do this

## 2017-05-01 NOTE — Telephone Encounter (Signed)
Call back from [facility]ospice [facility] - unable to reach Dr Reesa Chew per [facility] request. Gave her Dr [facility] pager number who is familiar w/pt and did discharge summary.

## 2017-05-01 NOTE — Telephone Encounter (Signed)
Patient does not have a PCP and Amy with Hospice is requesting Dr. Johnsie Cancel to be attending. Will forward to Dr. Johnsie Cancel.

## 2017-05-01 NOTE — Discharge Summary (Signed)
Medicine attending: I personally examined this patient on the day of discharge and I attest to the accuracy of the discharge evaluation and plan as recorded by resident physician Dr. Romelle Starcher Molt in her final progress note.  66 year old woman with  combined advanced ischemic dilated cardiomyopathy and severe mitral regurgitation admitted for recurrent acute on chronic combined systolic and diastolic heart failure.  She was treated with parenteral diuretics.  BNP 2018.  Admission chest x-ray with stable cardiomegaly and mild chronic interstitial changes.  Trace left pleural effusion versus pleural thickening.  Most recent echocardiogram done on June 6 with estimated ejection fraction 30-35%.  Left ventricle moderately dilated.  Left atrium moderately dilated.  Diffuse hypokinesis.  Severe mitral regurgitation.  Admission weight 125 pounds.  Discharge weight 124 pounds.  Clinically: Resolution of rales, and JVD.  No S3 gallop. She was seen in consultation by cardiology.  They felt that her cardiac condition was now end-stage and recommended a more palliative approach.  She was not a candidate for either an ICD placement or mitral valve replacement.  She was seen in consultation by palliative care team along with her primary caregiver 1 of her sisters Mrs. Arville Go.  She confirmed that she did not want prolonged or aggressive resuscitative efforts if her heart should stop.  She was agreeable to hospice care.  Hospice referral was made.  She was given assistance in designating a healthcare power of attorney. Due to persistent subjective dyspnea, palliative care team recommended oral morphine on an as-needed basis.  This did seem to help her.  Disposition: Condition stable enough for discharge but overall unstable in view of end-stage cardiac disease She will be discharged to home with hospice care. There were no complications.

## 2017-05-01 NOTE — Progress Notes (Signed)
   Advanced directive update and charted.

## 2017-05-01 NOTE — Progress Notes (Signed)
SATURATION QUALIFICATIONS: (This note is used to comply with regulatory documentation for home oxygen)  Patient Saturations on Room Air at Rest = 91%  Patient Saturations on Room Air while Ambulating = 88%  Patient Saturations on 1.5 Liters of oxygen while Ambulating = 95%  Please briefly explain why patient needs home oxygen:

## 2017-05-01 NOTE — Telephone Encounter (Signed)
No answer

## 2017-05-01 NOTE — Progress Notes (Addendum)
[facility]ia[facility] RN visit  [facility], verified delivery of equipment scheduled for today between 11:00-3:00.   Thank you  Farrel Gordon, [facility]iaison  [phone]  All Hospital Liaison are now on AMION  ** Update   Advised Hassan Rowan [facility] of status of [facility] delivery scheduled between 11:00-3:00 today.   **Update    Spoke with daughter Eustaquio Maize. Patients sister Marcie Bal will take patient home between 3:30-4:00 today.

## 2017-05-11 ENCOUNTER — Ambulatory Visit: Payer: Medicare Other | Admitting: Cardiovascular Disease

## 2017-06-10 ENCOUNTER — Ambulatory Visit: Payer: Self-pay | Admitting: Cardiovascular Disease

## 2017-07-04 DEATH — deceased

## 2017-10-22 ENCOUNTER — Encounter (HOSPITAL_COMMUNITY): Payer: Self-pay

## 2017-10-22 ENCOUNTER — Ambulatory Visit: Payer: Self-pay | Admitting: Family

## 2018-11-20 IMAGING — CR DG CHEST 2V
2 series · 2 of 2 positions shown · non-contrast
Comparison: Radiograph 03/09/2017

CLINICAL DATA: Shortness of breath tonight.

EXAM:
CHEST  2 VIEW

[chest lat]
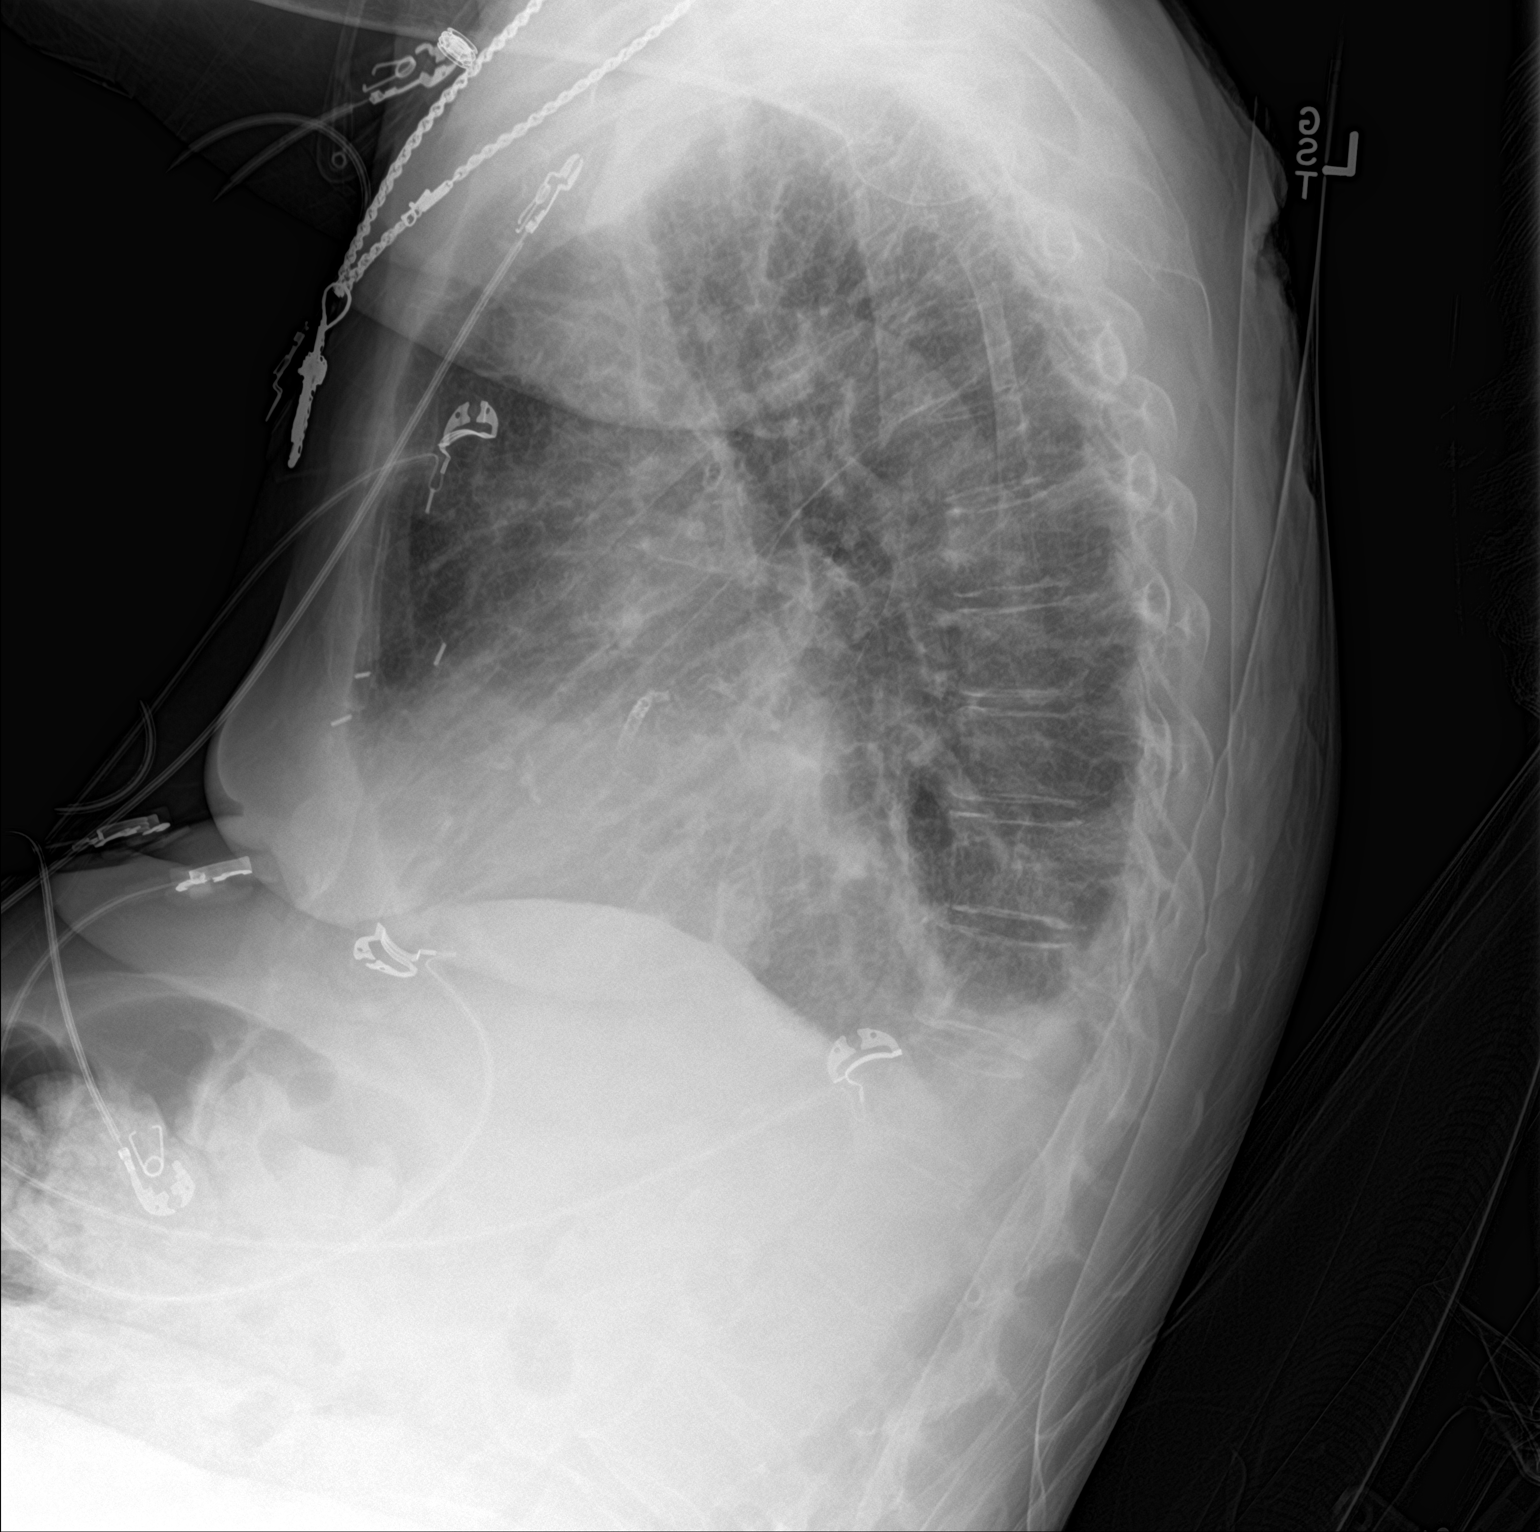

[chest ap]
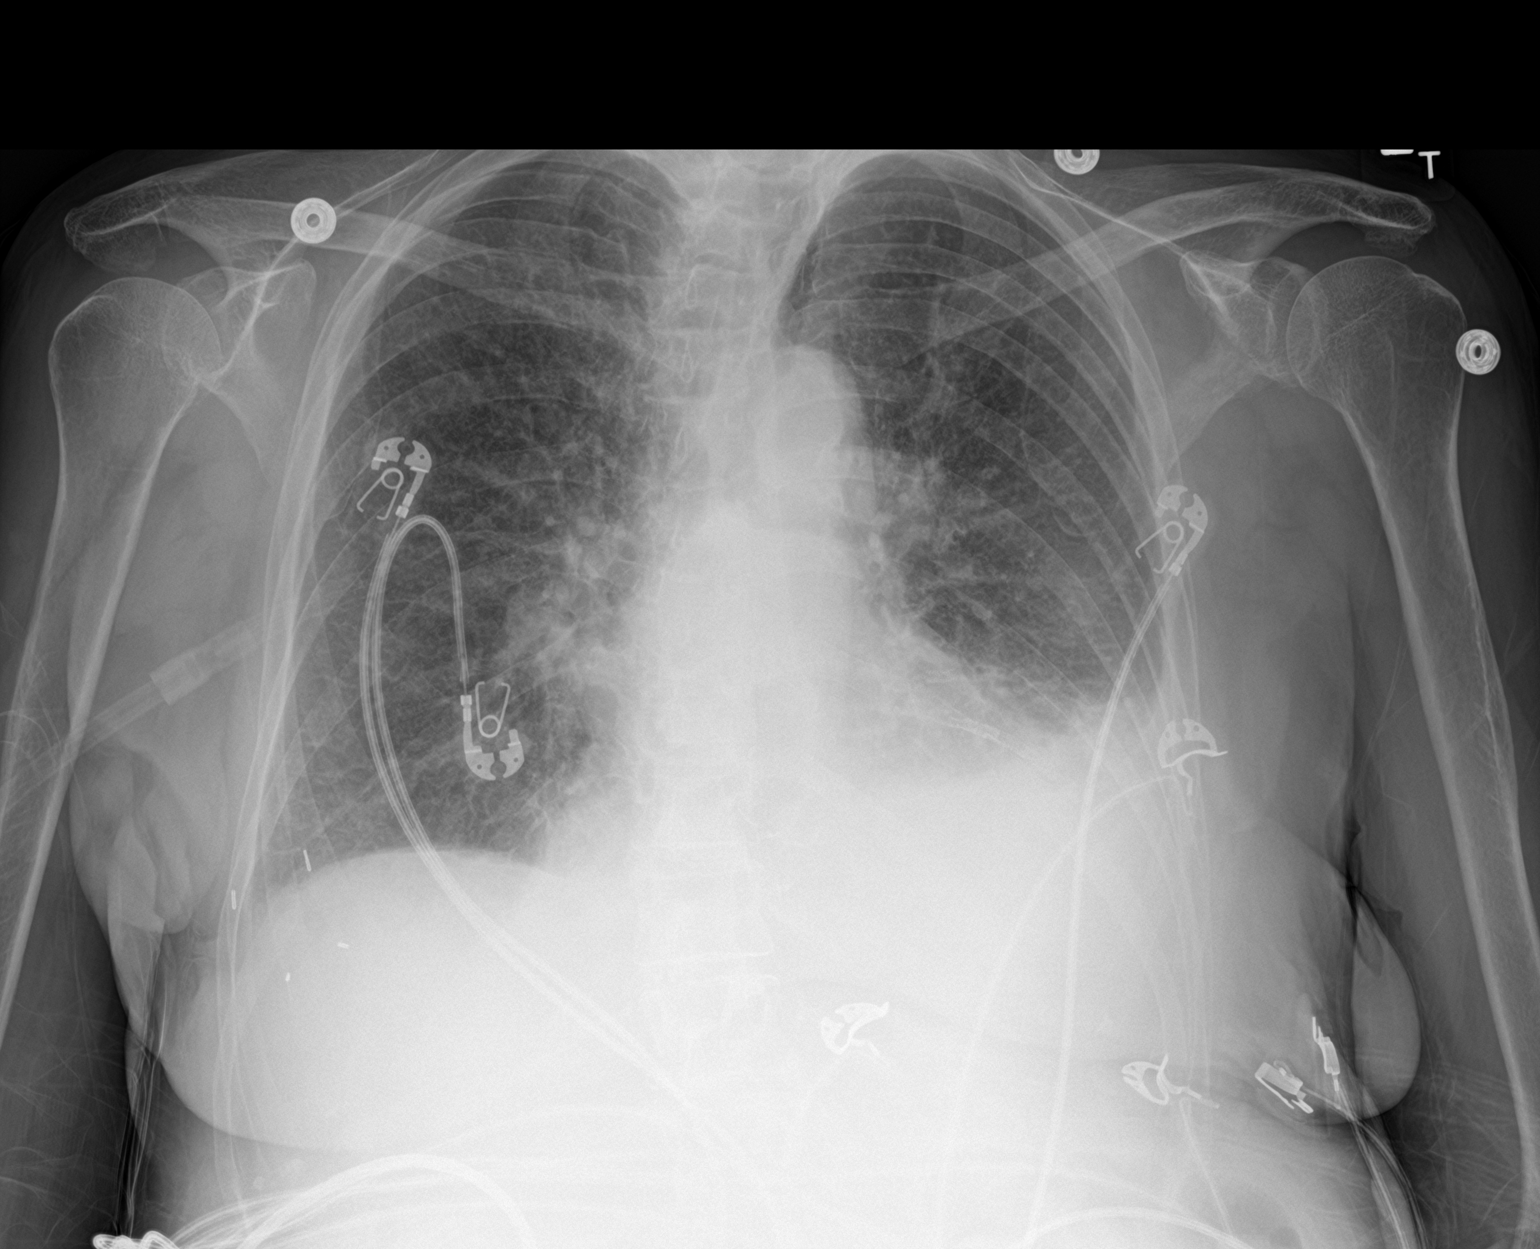

[2 of 2 positions shown; findings below may reference images not displayed]

FINDINGS: Unchanged heart size and mediastinal contours allowing for
differences in technique, mild cardiomegaly. Coronary stent in
place. Left pleural effusion has increased from prior exam. Right
pleural effusion has improved. Diffuse interstitial prominence
appears similar to prior exam allowing for differences in technique.
No acute osseous abnormalities.
IMPRESSION: Shifting pleural effusions with increased size on the left and
decreased size on the right.

Diffuse interstitial prominence appears similar to prior, may be
interstitial edema or underlying emphysematous change.
